# Patient Record
Sex: Female | Born: 1935
Health system: Southern US, Community
[De-identification: ages and names within clinical notes are randomized; demographics above are authoritative.]

## PROBLEM LIST (undated history)

## (undated) DIAGNOSIS — B029 Zoster without complications: Secondary | ICD-10-CM

## (undated) DIAGNOSIS — I272 Pulmonary hypertension, unspecified: Secondary | ICD-10-CM

## (undated) DIAGNOSIS — K5790 Diverticulosis of intestine, part unspecified, without perforation or abscess without bleeding: Secondary | ICD-10-CM

## (undated) DIAGNOSIS — I480 Paroxysmal atrial fibrillation: Secondary | ICD-10-CM

## (undated) DIAGNOSIS — K648 Other hemorrhoids: Secondary | ICD-10-CM

## (undated) DIAGNOSIS — J302 Other seasonal allergic rhinitis: Secondary | ICD-10-CM

## (undated) DIAGNOSIS — I2781 Cor pulmonale (chronic): Secondary | ICD-10-CM

## (undated) DIAGNOSIS — I10 Essential (primary) hypertension: Secondary | ICD-10-CM

## (undated) DIAGNOSIS — I4819 Other persistent atrial fibrillation: Secondary | ICD-10-CM

## (undated) DIAGNOSIS — E039 Hypothyroidism, unspecified: Secondary | ICD-10-CM

## (undated) HISTORY — PX: ABDOMINAL HYSTERECTOMY: SUR658

## (undated) HISTORY — DX: Essential (primary) hypertension: I10

## (undated) HISTORY — DX: Zoster without complications: B02.9

## (undated) HISTORY — DX: Pulmonary hypertension, unspecified: I27.20

## (undated) HISTORY — DX: Other seasonal allergic rhinitis: J30.2

## (undated) HISTORY — DX: Hypothyroidism, unspecified: E03.9

## (undated) HISTORY — DX: Paroxysmal atrial fibrillation: I48.0

## (undated) HISTORY — DX: Other persistent atrial fibrillation: I48.19

## (undated) HISTORY — DX: Cor pulmonale (chronic): I27.81

## (undated) HISTORY — DX: Other hemorrhoids: K64.8

## (undated) HISTORY — DX: Diverticulosis of intestine, part unspecified, without perforation or abscess without bleeding: K57.90

## (undated) HISTORY — PX: CATARACT EXTRACTION: SUR2

---

## 2001-05-16 ENCOUNTER — Ambulatory Visit (HOSPITAL_COMMUNITY): Admission: RE | Admit: 2001-05-16 | Discharge: 2001-05-16 | Payer: Self-pay

## 2001-05-16 ENCOUNTER — Encounter: Payer: Self-pay | Admitting: Internal Medicine

## 2002-06-26 ENCOUNTER — Encounter: Payer: Self-pay | Admitting: Internal Medicine

## 2002-06-26 ENCOUNTER — Ambulatory Visit (HOSPITAL_COMMUNITY): Admission: RE | Admit: 2002-06-26 | Discharge: 2002-06-26 | Payer: Self-pay | Admitting: Internal Medicine

## 2002-08-03 ENCOUNTER — Ambulatory Visit (HOSPITAL_COMMUNITY): Admission: RE | Admit: 2002-08-03 | Discharge: 2002-08-03 | Payer: Self-pay | Admitting: Internal Medicine

## 2003-01-21 ENCOUNTER — Emergency Department (HOSPITAL_COMMUNITY): Admission: EM | Admit: 2003-01-21 | Discharge: 2003-01-21 | Payer: Self-pay | Admitting: *Deleted

## 2003-01-21 ENCOUNTER — Encounter: Payer: Self-pay | Admitting: *Deleted

## 2003-07-02 ENCOUNTER — Ambulatory Visit (HOSPITAL_COMMUNITY): Admission: RE | Admit: 2003-07-02 | Discharge: 2003-07-02 | Payer: Self-pay | Admitting: Internal Medicine

## 2004-07-03 ENCOUNTER — Ambulatory Visit (HOSPITAL_COMMUNITY): Admission: RE | Admit: 2004-07-03 | Discharge: 2004-07-03 | Payer: Self-pay | Admitting: Internal Medicine

## 2004-09-03 ENCOUNTER — Ambulatory Visit: Payer: Self-pay | Admitting: Internal Medicine

## 2004-09-10 ENCOUNTER — Ambulatory Visit: Payer: Self-pay | Admitting: Internal Medicine

## 2004-09-10 ENCOUNTER — Ambulatory Visit (HOSPITAL_COMMUNITY): Admission: RE | Admit: 2004-09-10 | Discharge: 2004-09-10 | Payer: Self-pay | Admitting: Internal Medicine

## 2005-07-14 ENCOUNTER — Ambulatory Visit (HOSPITAL_COMMUNITY): Admission: RE | Admit: 2005-07-14 | Discharge: 2005-07-14 | Payer: Self-pay | Admitting: Internal Medicine

## 2005-08-12 ENCOUNTER — Ambulatory Visit (HOSPITAL_COMMUNITY): Admission: RE | Admit: 2005-08-12 | Discharge: 2005-08-12 | Payer: Self-pay | Admitting: Cardiology

## 2006-05-01 ENCOUNTER — Ambulatory Visit (HOSPITAL_COMMUNITY): Admission: RE | Admit: 2006-05-01 | Discharge: 2006-05-01 | Payer: Self-pay | Admitting: Internal Medicine

## 2006-05-03 ENCOUNTER — Encounter: Payer: Self-pay | Admitting: Internal Medicine

## 2006-05-06 ENCOUNTER — Ambulatory Visit (HOSPITAL_COMMUNITY): Admission: RE | Admit: 2006-05-06 | Discharge: 2006-05-06 | Payer: Self-pay | Admitting: Interventional Radiology

## 2006-05-25 ENCOUNTER — Encounter: Payer: Self-pay | Admitting: Interventional Radiology

## 2006-08-16 ENCOUNTER — Ambulatory Visit (HOSPITAL_COMMUNITY): Admission: RE | Admit: 2006-08-16 | Discharge: 2006-08-16 | Payer: Self-pay | Admitting: Internal Medicine

## 2010-06-29 ENCOUNTER — Encounter: Payer: Self-pay | Admitting: Internal Medicine

## 2012-02-09 ENCOUNTER — Encounter: Payer: Self-pay | Admitting: Cardiology

## 2012-02-09 ENCOUNTER — Encounter: Payer: Self-pay | Admitting: Internal Medicine

## 2012-02-09 DIAGNOSIS — I4891 Unspecified atrial fibrillation: Secondary | ICD-10-CM

## 2012-02-10 DIAGNOSIS — I4891 Unspecified atrial fibrillation: Secondary | ICD-10-CM

## 2012-03-01 ENCOUNTER — Encounter: Payer: Self-pay | Admitting: Cardiology

## 2012-03-14 ENCOUNTER — Ambulatory Visit (INDEPENDENT_AMBULATORY_CARE_PROVIDER_SITE_OTHER): Payer: Medicare Other | Admitting: Cardiology

## 2012-03-14 ENCOUNTER — Encounter: Payer: Self-pay | Admitting: Cardiology

## 2012-03-14 VITALS — BP 134/74 | HR 75 | Ht 62.0 in | Wt 101.8 lb

## 2012-03-14 DIAGNOSIS — I10 Essential (primary) hypertension: Secondary | ICD-10-CM

## 2012-03-14 DIAGNOSIS — I480 Paroxysmal atrial fibrillation: Secondary | ICD-10-CM | POA: Insufficient documentation

## 2012-03-14 DIAGNOSIS — I4891 Unspecified atrial fibrillation: Secondary | ICD-10-CM

## 2012-03-14 DIAGNOSIS — Z136 Encounter for screening for cardiovascular disorders: Secondary | ICD-10-CM

## 2012-03-14 NOTE — Assessment & Plan Note (Signed)
Plan is to continue current regimen including beta blocker and Xarelto. Followup echocardiogram will be obtained in the next 3 weeks, mainly to exclude the presence of definite left atrial thrombus, suspect artifact based on prior study. If this cannot be clarified, we can always consider a TEE, although she will be continued onanticoagulation anyway.

## 2012-03-14 NOTE — Patient Instructions (Addendum)
Your physician recommends that you schedule a follow-up appointment in: 3 months. Your physician recommends that you continue on your current medications as directed. Please refer to the Current Medication list given to you today. Your physician has requested that you have an echocardiogram. Echocardiography is a painless test that uses sound waves to create images of your heart. It provides your doctor with information about the size and shape of your heart and how well your heart's chambers and valves are working. This procedure takes approximately one hour. There are no restrictions for this procedure.  

## 2012-03-14 NOTE — Progress Notes (Signed)
   Clinical Summary Shelly Fields is a 77 y.o.female presenting for office followup. She was seen in consultation at [facility] back in September with newly diagnosed atrial fibrillation, possibly paroxysmal based on history. She spontaneously converted to sinus rhythm. We recommended anticoagulation with inability to exclude left atrial thrombus by echocardiogram (report commented on the possibility of artifact), also CHAD2 score of approximately 2. She had no thromboembolic signs or symptoms however. We did not pursue a TEE. She initially wanted to use Coumadin with followup per Dr. Sherril Croon, however ultimately elected to start Xarelto. She has had no bleeding problems.  She reports no prolonged palpitations, ECG shows normal sinus rhythm at 75 beats per minute with left atrial enlargement and incomplete right bundle branch block.   No Known Allergies  Current Outpatient Prescriptions  Medication Sig Dispense Refill  . calcium carbonate (OS-CAL) 600 MG TABS Take 600 mg by mouth 2 (two) times daily with a meal.      . Cholecalciferol (VITAMIN D3) 5000 UNITS CAPS Take 5,000 Units by mouth daily.      Marland Kitchen docusate sodium (COLACE) 100 MG capsule Take 100 mg by mouth at bedtime.      Marland Kitchen levothyroxine (SYNTHROID, LEVOTHROID) 50 MCG tablet Take 50 mcg by mouth daily.      . metoprolol tartrate (LOPRESSOR) 25 MG tablet Take 25 mg by mouth daily.       . Multiple Vitamin (MULTIVITAMIN) tablet Take 1 tablet by mouth daily.      Carlena Hurl 20 MG TABS Take 20 mg by mouth daily.         Past Medical History  Diagnosis Date  . Hypothyroidism   . Shingles   . Internal hemorrhoids   . Seasonal allergies   . Essential hypertension, benign   . Diverticulosis     Social History Shelly Fields reports that she has never smoked. She does not have any smokeless tobacco history on file. Ms. Blackwelder reports that she does not drink alcohol.  Review of Systems Otherwise negative except as outlined.  Physical  Examination Filed Vitals:   03/14/12 1418  BP: 134/74  Pulse: 75   Filed Weights   03/14/12 1418  Weight: 101 lb 12.8 oz (46.176 kg)   Thin woman in no acute distress. HEENT: Conjunctiva and lids normal, oropharynx clear. Neck: Supple, no elevated JVP or carotid bruits, no thyromegaly. Lungs: Clear to auscultation, nonlabored breathing at rest. Cardiac: Regular rate and rhythm, no S3, soft systolic murmur, no pericardial rub. Abdomen: Soft, nontender, bowel sounds present, no guarding or rebound. Extremities: No pitting edema, distal pulses 2+. Skin: Warm and dry. Musculoskeletal: No kyphosis. Neuropsychiatric: Alert and oriented x3, affect grossly appropriate.   Problem List and Plan   Atrial fibrillation Plan is to continue current regimen including beta blocker and Xarelto. Followup echocardiogram will be obtained in the next 3 weeks, mainly to exclude the presence of definite left atrial thrombus, suspect artifact based on prior study. If this cannot be clarified, we can always consider a TEE, although she will be continued onanticoagulation anyway.  Essential hypertension, benign Blood pressure is reasonable today.    Jonelle Sidle, M.D., F.A.C.C.

## 2012-03-14 NOTE — Assessment & Plan Note (Signed)
Blood pressure is reasonable today. 

## 2012-04-06 ENCOUNTER — Telehealth: Payer: Self-pay | Admitting: Cardiology

## 2012-04-06 ENCOUNTER — Other Ambulatory Visit: Payer: Self-pay

## 2012-04-06 ENCOUNTER — Other Ambulatory Visit (INDEPENDENT_AMBULATORY_CARE_PROVIDER_SITE_OTHER): Payer: Medicare Other

## 2012-04-06 DIAGNOSIS — I4891 Unspecified atrial fibrillation: Secondary | ICD-10-CM

## 2012-04-06 NOTE — Telephone Encounter (Signed)
Patient states had appointment for echo today.  Patient states that she received a message from [place], but had a hard time hearing the message on the phone.  Maryella Shivers was with a patient at the moment and would call her back after clinic.  Patient understood this.

## 2012-04-06 NOTE — Telephone Encounter (Signed)
Message copied by Eustace Moore on Wed Apr 06, 2012  4:14 PM ------      Message from: MCDOWELL, Illene Bolus      Created: Wed Apr 06, 2012  2:52 PM       Reviewed. No definite left atrial thrombus. Continue medical therapy. No clear indication for TEE at this time.

## 2012-04-06 NOTE — Telephone Encounter (Signed)
Patient informed. 

## 2012-05-09 ENCOUNTER — Telehealth: Payer: Self-pay | Admitting: *Deleted

## 2012-05-09 NOTE — Telephone Encounter (Signed)
Started feeling palpitations last evening - lasted about 1 hour.  Went on to bed, was able to sleep all night without any difficulty. Then, this morning c/o feeling weak with slight nausea.  BP - 97/67  83.  No fever.  Now feels okay.  Does already have follow up with her PMD ([facility]) tomorrow.  Is due to see SM on January 8th.

## 2012-06-14 ENCOUNTER — Telehealth: Payer: Self-pay | Admitting: *Deleted

## 2012-06-14 NOTE — Telephone Encounter (Signed)
Patient called to cancel appointment for in the morning because her water is frozen today. Nurse advised patient that she could call in the morning to let us know if she can't make it. Patient concerned about being charged if she calls tomorrow. Nurse advised patient that she would not be charged for calling to reschedule on same day of appointment.

## 2012-06-15 ENCOUNTER — Ambulatory Visit: Payer: Medicare Other | Admitting: Cardiology

## 2012-06-17 ENCOUNTER — Encounter: Payer: Self-pay | Admitting: Cardiology

## 2012-06-17 ENCOUNTER — Telehealth: Payer: Self-pay | Admitting: *Deleted

## 2012-06-17 NOTE — Telephone Encounter (Signed)
Woke up this morning at 4 am feeling clammy and felt her heart beating funny. Patient said she stood up and fell to floor because she passed out. Patient checked her BP which was 109/68 & HR 79 and now HR is 98. Patient can feel her heart beating funny now. No sob.  Patient denies chest pain. Nurse informed patient to go to ED for evaluation since she passed out this morning and patient reluctant to going to ED. Patient request message be sent to MD for advise.

## 2012-06-18 ENCOUNTER — Encounter: Payer: Self-pay | Admitting: Cardiovascular Disease

## 2012-06-20 ENCOUNTER — Telehealth: Payer: Self-pay | Admitting: Cardiology

## 2012-06-20 NOTE — Telephone Encounter (Signed)
Patient wanted to know if she needed to keep the f/u visit with cardiology this week since she has appointment with PCP on Friday. Nurse advised patient that hospital note suggest she consult with cardiology on an outpatient basis and that she should come to r/o any cardiology problems. Patient  Verbalized understanding of plan.

## 2012-06-20 NOTE — Telephone Encounter (Signed)
Patient wanted nurse to call her in reference to last weeks call.  She went to hospital like you advise.  Would like to discuss upcoming appointment with you

## 2012-06-23 ENCOUNTER — Encounter: Payer: Self-pay | Admitting: Cardiovascular Disease

## 2012-06-23 ENCOUNTER — Ambulatory Visit (INDEPENDENT_AMBULATORY_CARE_PROVIDER_SITE_OTHER): Payer: Medicare Other | Admitting: Cardiovascular Disease

## 2012-06-23 VITALS — BP 138/73 | HR 62 | Ht 62.0 in | Wt 101.0 lb

## 2012-06-23 DIAGNOSIS — I4891 Unspecified atrial fibrillation: Secondary | ICD-10-CM

## 2012-06-23 DIAGNOSIS — I10 Essential (primary) hypertension: Secondary | ICD-10-CM

## 2012-06-23 DIAGNOSIS — R55 Syncope and collapse: Secondary | ICD-10-CM

## 2012-06-23 NOTE — Assessment & Plan Note (Signed)
Recent syncopal episode which from the history is suggestive of neurocardiogenic (vasovagal) syncope. However, given her reported symptoms of fluctuating heart rate, there is a possibility of postconversion pauses from atrial fibrillation. I recommend a 48 hour Holter monitor. If the patient experiences recurrent syncopal episode with no significant events noted on Holter monitor, then she will need a 30 day event monitor.  I advised the patient to notify us if she develops any headache given that she is on anticoagulation. CT scan obviously did not show evidence of intracranial or subdural bleed.

## 2012-06-23 NOTE — Progress Notes (Signed)
   Clinical Summary Shelly Fields is a 78 y.o.female presenting for office followup after her recent syncopal episode and brief hospitalization at [facility]. She was initially seen in consultation at [facility] back in September with newly diagnosed atrial fibrillation, possibly paroxysmal based on history. She spontaneously converted to sinus rhythm. Anticoagulation was recommended with inability to exclude left atrial thrombus by echocardiogram (report commented on the possibility of artifact), also CHAD2 score of  2. She underwent a followup echocardiogram that showed no evidence of intracardiac thrombus. She was in her usual health up until Friday. She woke up not feeling well with weakness, feeling sweaty and clammy. She went to the bathroom and after she urinated, she passed out and fell on the floor. She hit her head. She went to the emergency room at [facility]. Her labs were unremarkable. CT scan of the head showed no evidence of intracranial bleed. There was a small superficial hematoma. After the event, she noticed fluctuation in her heart rate. Her blood pressure was 105/73. No event since then. She denies chest pain or dyspnea.  No Known Allergies  Current Outpatient Prescriptions  Medication Sig Dispense Refill  . calcium carbonate (OS-CAL) 600 MG TABS Take 600 mg by mouth 2 (two) times daily with a meal.      . Cholecalciferol (VITAMIN D3) 5000 UNITS CAPS Take 5,000 Units by mouth daily.      Marland Kitchen docusate sodium (COLACE) 100 MG capsule Take 200 mg by mouth at bedtime.       Marland Kitchen levothyroxine (SYNTHROID, LEVOTHROID) 50 MCG tablet Take 50 mcg by mouth daily.      . metoprolol succinate (TOPROL-XL) 25 MG 24 hr tablet Take 1 tablet by mouth Daily.      . Multiple Vitamin (MULTIVITAMIN) tablet Take 1 tablet by mouth daily.      Carlena Hurl 20 MG TABS Take 20 mg by mouth daily.         Past Medical History  Diagnosis Date  . Hypothyroidism   . Shingles   . Internal hemorrhoids   . Seasonal  allergies   . Essential hypertension, benign   . Diverticulosis     Social History Shelly Fields reports that she has never smoked. She has never used smokeless tobacco. Ms. Cronk reports that she does not drink alcohol.  Review of Systems Otherwise negative except as outlined.  Physical Examination Filed Vitals:   06/23/12 1005  BP: 138/73  Pulse: 62   Filed Weights   06/23/12 1005  Weight: 101 lb (45.813 kg)   Thin woman in no acute distress. HEENT: Conjunctiva and lids normal, oropharynx clear. Neck: Supple, no elevated JVP or carotid bruits, no thyromegaly. Lungs: Clear to auscultation, nonlabored breathing at rest. Cardiac: Regular rate and rhythm, no S3, soft systolic murmur, no pericardial rub. Abdomen: Soft, nontender, bowel sounds present, no guarding or rebound. Extremities: No pitting edema, distal pulses 2+. Skin: Warm and dry. Musculoskeletal: No kyphosis. Neuropsychiatric: Alert and oriented x3, affect grossly appropriate.   Recent EKG showed normal sinus rhythm with no significant ST changes.

## 2012-06-23 NOTE — Assessment & Plan Note (Signed)
Blood pressure is well controlled 

## 2012-06-23 NOTE — Assessment & Plan Note (Signed)
She is in sinus rhythm.

## 2012-06-23 NOTE — Patient Instructions (Addendum)
   48 hour holter monitor - placed today  Office will notify of results Continue all current medications. Follow up in  1 month

## 2012-07-27 ENCOUNTER — Ambulatory Visit (INDEPENDENT_AMBULATORY_CARE_PROVIDER_SITE_OTHER): Payer: Medicare Other | Admitting: Cardiology

## 2012-07-27 ENCOUNTER — Encounter: Payer: Self-pay | Admitting: Cardiology

## 2012-07-27 VITALS — BP 137/76 | HR 60 | Ht 62.0 in | Wt 101.0 lb

## 2012-07-27 DIAGNOSIS — I4891 Unspecified atrial fibrillation: Secondary | ICD-10-CM

## 2012-07-27 DIAGNOSIS — I10 Essential (primary) hypertension: Secondary | ICD-10-CM

## 2012-07-27 DIAGNOSIS — R55 Syncope and collapse: Secondary | ICD-10-CM

## 2012-07-27 MED ORDER — RIVAROXABAN 20 MG PO TABS: 20.0000 mg | ORAL_TABLET | Freq: Every day | ORAL | Status: DC

## 2012-07-27 NOTE — Assessment & Plan Note (Signed)
Suspect that this episode was neurocardiogenic in etiology based on her description. Will continue to monitor for now.

## 2012-07-27 NOTE — Progress Notes (Signed)
   Clinical Summary Ms. [name] is a 78 y.o.female presenting for followup. She was last seen in October 2013. Followup echocardiogram at that time showed no clear evidence of definite left atrial thrombus, LVEF 60-65%, grade 2 diastolic dysfunction, trivial aortic regurgitation, mild to moderate tricuspid regurgitation, PASP 26 mm mercury. We reviewed this today.  She is doing well, reports no significant palpitations. Continues on Toprol-XL and Xarelto. Has had no bleeding problems. I reviewed her home blood pressure and heart rate checks. She had one spell of brief syncope since I last saw her, seems to have been neurocardiogenic based on description, associated with nausea and sweating. Otherwise no significant functional limitation.   No Known Allergies  Current Outpatient Prescriptions  Medication Sig Dispense Refill  . calcium carbonate (OS-CAL) 600 MG TABS Take 600 mg by mouth 2 (two) times daily with a meal.      . Cholecalciferol (VITAMIN D3) 5000 UNITS CAPS Take 5,000 Units by mouth daily.      Marland Kitchen docusate sodium (COLACE) 100 MG capsule Take 200 mg by mouth at bedtime.       Marland Kitchen levothyroxine (SYNTHROID, LEVOTHROID) 50 MCG tablet Take 50 mcg by mouth daily.      . metoprolol succinate (TOPROL-XL) 25 MG 24 hr tablet Take 1 tablet by mouth Daily.      . Multiple Vitamin (MULTIVITAMIN) tablet Take 1 tablet by mouth daily.      . Rivaroxaban (XARELTO) 20 MG TABS Take 1 tablet (20 mg total) by mouth daily.  30 tablet  6   No current facility-administered medications for this visit.    Past Medical History  Diagnosis Date  . Hypothyroidism   . Shingles   . Internal hemorrhoids   . Seasonal allergies   . Essential hypertension, benign   . Diverticulosis   . Paroxysmal atrial fibrillation     Social History Ms. [name] reports that she has never smoked. She has never used smokeless tobacco. Ms. Roscher reports that she does not drink alcohol.  Review of Systems Negative except as  outlined.  Physical Examination Filed Vitals:   07/27/12 1441  BP: 137/76  Pulse: 60   Filed Weights   07/27/12 1441  Weight: 101 lb (45.813 kg)    Thin woman in no acute distress.  HEENT: Conjunctiva and lids normal, oropharynx clear.  Neck: Supple, no elevated JVP or carotid bruits, no thyromegaly.  Lungs: Clear to auscultation, nonlabored breathing at rest.  Cardiac: Regular rate and rhythm, no S3, soft systolic murmur, no pericardial rub.  Abdomen: Soft, nontender, bowel sounds present, no guarding or rebound.  Extremities: No pitting edema, distal pulses 2+.    Problem List and Plan   Atrial fibrillation Paroxysmal, she is in sinus rhythm today. Continue current regimen and followup.  Essential hypertension, benign No change to current regimen, reviewed outpatient blood pressure recordings which largely look good.  Syncope Suspect that this episode was neurocardiogenic in etiology based on her description. Will continue to monitor for now.    Jonelle Sidle, M.D., F.A.C.C.

## 2012-07-27 NOTE — Assessment & Plan Note (Signed)
Paroxysmal, she is in sinus rhythm today. Continue current regimen and followup.

## 2012-07-27 NOTE — Assessment & Plan Note (Signed)
No change to current regimen, reviewed outpatient blood pressure recordings which largely look good.

## 2012-07-27 NOTE — Patient Instructions (Addendum)

## 2012-11-29 ENCOUNTER — Encounter (INDEPENDENT_AMBULATORY_CARE_PROVIDER_SITE_OTHER): Payer: Self-pay | Admitting: *Deleted

## 2012-11-29 ENCOUNTER — Encounter (INDEPENDENT_AMBULATORY_CARE_PROVIDER_SITE_OTHER): Payer: Self-pay

## 2012-12-07 ENCOUNTER — Telehealth (INDEPENDENT_AMBULATORY_CARE_PROVIDER_SITE_OTHER): Payer: Self-pay | Admitting: *Deleted

## 2012-12-07 ENCOUNTER — Other Ambulatory Visit (INDEPENDENT_AMBULATORY_CARE_PROVIDER_SITE_OTHER): Payer: Self-pay | Admitting: *Deleted

## 2012-12-07 DIAGNOSIS — Z1211 Encounter for screening for malignant neoplasm of colon: Secondary | ICD-10-CM

## 2012-12-07 DIAGNOSIS — Z86010 Personal history of colonic polyps: Secondary | ICD-10-CM

## 2012-12-07 NOTE — Telephone Encounter (Signed)
Patient needs movi prep 

## 2012-12-13 MED ORDER — PEG-KCL-NACL-NASULF-NA ASC-C 100 G PO SOLR: 1.0000 | Freq: Once | ORAL | Status: DC

## 2012-12-20 ENCOUNTER — Telehealth (INDEPENDENT_AMBULATORY_CARE_PROVIDER_SITE_OTHER): Payer: Self-pay | Admitting: *Deleted

## 2012-12-20 NOTE — Telephone Encounter (Signed)
agree

## 2012-12-20 NOTE — Telephone Encounter (Signed)
  Procedure: tcs  Reason/Indication:  Hx polyps  Has patient had this procedure before?  Yes, 12/2007 (scanned)  If so, when, by whom and where?    Is there a family history of colon cancer?  no  Who?  What age when diagnosed?    Is patient diabetic?   no      Does patient have prosthetic heart valve?  no  Do you have a pacemaker?  no  Has patient ever had endocarditis? no  Has patient had joint replacement within last 12 months?  no  Is patient on Coumadin, Plavix and/or Aspirin? yes  Medications: xarelto 20 mg daily, levothyroxine 50 mg daily, metoprolol 25 mg daily, calcium bid, multi vit daily, vit d3 5000 iu daily, stool softener bid nightly, laxative prn  Allergies: nkda  Medication Adjustment: xarelto 1-2 days  Procedure date & time: 01/12/13 at 1030

## 2013-01-04 ENCOUNTER — Encounter (HOSPITAL_COMMUNITY): Payer: Self-pay | Admitting: Pharmacy Technician

## 2013-01-11 ENCOUNTER — Other Ambulatory Visit: Payer: Self-pay

## 2013-01-12 ENCOUNTER — Encounter (HOSPITAL_COMMUNITY): Payer: Self-pay | Admitting: *Deleted

## 2013-01-12 ENCOUNTER — Encounter (HOSPITAL_COMMUNITY)
Admission: RE | Disposition: A | Discharge status: Home / Self Care | Payer: Self-pay | Source: Ambulatory Visit | Attending: Internal Medicine

## 2013-01-12 ENCOUNTER — Ambulatory Visit (HOSPITAL_COMMUNITY)
Admission: RE | Admit: 2013-01-12 | Discharge: 2013-01-12 | Disposition: A | Discharge status: Home / Self Care | Payer: Medicare Other | Source: Ambulatory Visit | Attending: Internal Medicine | Admitting: Internal Medicine

## 2013-01-12 DIAGNOSIS — K5730 Diverticulosis of large intestine without perforation or abscess without bleeding: Secondary | ICD-10-CM

## 2013-01-12 DIAGNOSIS — K644 Residual hemorrhoidal skin tags: Secondary | ICD-10-CM | POA: Insufficient documentation

## 2013-01-12 DIAGNOSIS — Z860100 Personal history of colon polyps, unspecified: Secondary | ICD-10-CM | POA: Insufficient documentation

## 2013-01-12 DIAGNOSIS — Z8371 Family history of colonic polyps: Secondary | ICD-10-CM | POA: Insufficient documentation

## 2013-01-12 DIAGNOSIS — Z83719 Family history of colon polyps, unspecified: Secondary | ICD-10-CM | POA: Insufficient documentation

## 2013-01-12 DIAGNOSIS — I10 Essential (primary) hypertension: Secondary | ICD-10-CM | POA: Insufficient documentation

## 2013-01-12 DIAGNOSIS — K6389 Other specified diseases of intestine: Secondary | ICD-10-CM

## 2013-01-12 HISTORY — PX: COLONOSCOPY: SHX5424

## 2013-01-12 SURGERY — COLONOSCOPY
Anesthesia: Moderate Sedation

## 2013-01-12 MED ORDER — MIDAZOLAM HCL 5 MG/5ML IJ SOLN
INTRAMUSCULAR | Status: AC
  Filled 2013-01-12: qty 10

## 2013-01-12 MED ORDER — MIDAZOLAM HCL 5 MG/5ML IJ SOLN
INTRAMUSCULAR | Status: DC | PRN
  Administered 2013-01-12: 2 mg via INTRAVENOUS
  Administered 2013-01-12 (×3): 1 mg via INTRAVENOUS

## 2013-01-12 MED ORDER — STERILE WATER FOR IRRIGATION IR SOLN
Status: DC | PRN
  Administered 2013-01-12: 11:00:00

## 2013-01-12 MED ORDER — SODIUM CHLORIDE 0.9 % IV SOLN
INTRAVENOUS | Status: DC
  Administered 2013-01-12: 10:00:00 via INTRAVENOUS

## 2013-01-12 MED ORDER — MEPERIDINE HCL 50 MG/ML IJ SOLN
INTRAMUSCULAR | Status: DC | PRN
  Administered 2013-01-12 (×2): 25 mg via INTRAVENOUS

## 2013-01-12 MED ORDER — MEPERIDINE HCL 50 MG/ML IJ SOLN
INTRAMUSCULAR | Status: AC
  Filled 2013-01-12: qty 1

## 2013-01-12 NOTE — Op Note (Signed)
COLONOSCOPY PROCEDURE REPORT  PATIENT:  Shelly Fields  MR#:  [id number] Birthdate:  1935/04/24, 78 y.o., female Endoscopist:  Dr. Malissa Hippo, MD Referred By:  Dr. Ignatius Specking, MD Procedure Date: 01/12/2013  Procedure:   Colonoscopy  Indications:  Patient is 78 year old Caucasian female with history of colonic adenoma and family history of colonic polyps.  Informed Consent:  The procedure and risks were reviewed with the patient and informed consent was obtained.  Medications:  Demerol 50 mg IV Versed 5 mg IV  Description of procedure:  After a digital rectal exam was performed, video colonoscope was advanced from the anus through the rectum and colon to the area of the cecum, ileocecal valve and appendiceal orifice. The cecum was deeply intubated. These structures were well-seen and photographed for the record. From the level of the cecum and ileocecal valve, the scope was slowly and cautiously withdrawn. The mucosal surfaces were carefully surveyed utilizing scope tip to flexion to facilitate fold flattening as needed. The scope was pulled down into the rectum where a thorough exam including retroflexion was performed.  Findings:  Procedure started with the pediatric colonoscope and completed with Slim scope.  Prep excellent. Tortuous colon with multiple diverticula and sigmoid colon with few at descending colon. 2 small AV malformations or ileocecal valve. No evidence of the recurrent polyps. Normal rectal mucosa. Prominent hemorrhoids below the dentate line longus single anal papilla.     Therapeutic/Diagnostic Maneuvers Performed:  None  Complications:  None  Cecal Withdrawal Time:  7 minutes  Impression:  Examination performed to cecum. 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 Left-sided diverticulosis. External hemorrhoids and single anal papilla. No evidence of recurrent polyps.  Recommendations:  Standard instructions given. Patient can wait 10 years  before next exam.   REHMAN,NAJEEB U  01/12/2013 11:25 AM  CC: Dr. Ignatius Specking., MD & Dr. Bonnetta Barry ref. provider found

## 2013-01-12 NOTE — H&P (Signed)
Shelly Fields is an 78 y.o. female.   Chief Complaint: Patient is here for colonoscopy. HPI: Patient is 78 year old Caucasian female with history of colonic adenoma and family history of colonic polyps in her sister who eventually died of ovarian carcinoma. She has history of constipation and she is able to control by dietary measures. She denies abdominal pain or rectal bleeding. History is negative for colorectal carcinoma.  Past Medical History  Diagnosis Date  . Hypothyroidism   . Shingles   . Internal hemorrhoids   . Seasonal allergies   . Essential hypertension, benign   . Diverticulosis   . Paroxysmal atrial fibrillation     Past Surgical History  Procedure Laterality Date  . Abdominal hysterectomy    . Cataract extraction      Family History  Problem Relation Age of Onset  . Heart disease Mother   . Hypertension Father    Social History:  reports that she has never smoked. She has never used smokeless tobacco. She reports that she does not drink alcohol or use illicit drugs.  Allergies: No Known Allergies  Medications Prior to Admission  Medication Sig Dispense Refill  . calcium carbonate (OS-CAL) 600 MG TABS Take 600 mg by mouth 2 (two) times daily with a meal.      . Cholecalciferol (VITAMIN D3) 5000 UNITS CAPS Take 5,000 Units by mouth daily.      Marland Kitchen docusate sodium (COLACE) 100 MG capsule Take 200 mg by mouth at bedtime.       Marland Kitchen levothyroxine (SYNTHROID, LEVOTHROID) 50 MCG tablet Take 50 mcg by mouth daily.      . metoprolol tartrate (LOPRESSOR) 25 MG tablet Take 25 mg by mouth daily at 12 noon.      . Multiple Vitamin (MULTIVITAMIN) tablet Take 1 tablet by mouth daily.      . peg 3350 powder (MOVIPREP) 100 G SOLR Take 1 kit (100 g total) by mouth once.  1 kit  0  . Rivaroxaban (XARELTO) 20 MG TABS Take 1 tablet (20 mg total) by mouth daily.  30 tablet  6    No results found for this or any previous visit (from the past 48 hour(s)). No results  found.  ROS  Blood pressure 147/62, pulse 69, temperature 98 F (36.7 C), temperature source Oral, resp. rate 18, SpO2 95.00%. Physical Exam  Constitutional:  Thin Caucasian female in NAD  HENT:  Mouth/Throat: Oropharynx is clear and moist.  Eyes: Conjunctivae are normal. No scleral icterus.  Neck: No thyromegaly present.  Cardiovascular: Normal rate, regular rhythm and normal heart sounds.   Respiratory: Effort normal.  GI: Soft. She exhibits no distension and no mass. There is no tenderness.  Musculoskeletal: She exhibits no edema.  Lymphadenopathy:    She has no cervical adenopathy.  Neurological: She is alert.  Skin: Skin is warm and dry.     Assessment/Plan History of colonic adenoma. Family history of colonic polyps(sister). Surveillance colonoscopy  Claressa Hughley U 01/12/2013, 10:22 AM

## 2013-01-13 ENCOUNTER — Encounter (HOSPITAL_COMMUNITY): Payer: Self-pay | Admitting: Internal Medicine

## 2013-03-01 ENCOUNTER — Other Ambulatory Visit: Payer: Self-pay | Admitting: Cardiology

## 2013-03-15 ENCOUNTER — Ambulatory Visit (INDEPENDENT_AMBULATORY_CARE_PROVIDER_SITE_OTHER): Payer: Medicare Other | Admitting: Cardiology

## 2013-03-15 ENCOUNTER — Encounter: Payer: Self-pay | Admitting: Cardiology

## 2013-03-15 VITALS — BP 125/79 | HR 66 | Ht 62.0 in | Wt 98.0 lb

## 2013-03-15 DIAGNOSIS — I4891 Unspecified atrial fibrillation: Secondary | ICD-10-CM

## 2013-03-15 DIAGNOSIS — I10 Essential (primary) hypertension: Secondary | ICD-10-CM

## 2013-03-15 MED ORDER — RIVAROXABAN 20 MG PO TABS: 20.0000 mg | ORAL_TABLET | Freq: Every day | ORAL | Status: DC

## 2013-03-15 MED ORDER — METOPROLOL SUCCINATE ER 25 MG PO TB24: 25.0000 mg | ORAL_TABLET | Freq: Every day | ORAL | Status: DC

## 2013-03-15 NOTE — Assessment & Plan Note (Signed)
Blood pressure well-controlled today. 

## 2013-03-15 NOTE — Assessment & Plan Note (Signed)
Paroxysmal, infrequent on current regimen. Plan to change her back to Toprol-XL from Lopressor, continue Xarelto - she is currently in the doughnut hole, samples were given. Followup in 6 months.

## 2013-03-15 NOTE — Progress Notes (Signed)
    Clinical Summary Shelly Fields is a 78 y.o.female last seen in February of this year. She has been doing well, stays active. States that she has been doing some exercises including walking most days of the week. Has only occasional sense of palpitations.  Followup echocardiogram in October 2013 showed no clear evidence of definite left atrial thrombus, LVEF 60-65%, grade 2 diastolic dysfunction, trivial aortic regurgitation, mild to moderate tricuspid regurgitation, PASP 26 mm mercury.  We reviewed her medications. She reports no bleeding problems on Xarelto. No hospitalizations or major interval illnesses. Did have a colonoscopy.   No Known Allergies  Current Outpatient Prescriptions  Medication Sig Dispense Refill  . acyclovir (ZOVIRAX) 200 MG capsule Take 1 capsule by mouth as needed.      . calcium carbonate (OS-CAL) 600 MG TABS Take 600 mg by mouth 2 (two) times daily with a meal.      . Cholecalciferol (VITAMIN D3) 5000 UNITS CAPS Take 5,000 Units by mouth daily.      Marland Kitchen docusate sodium (COLACE) 100 MG capsule Take 200 mg by mouth at bedtime.       Marland Kitchen levothyroxine (SYNTHROID, LEVOTHROID) 50 MCG tablet Take 50 mcg by mouth daily.      . Multiple Vitamin (MULTIVITAMIN) tablet Take 1 tablet by mouth daily.      Carlena Hurl 20 MG TABS tablet TAKE ONE TABLET BY MOUTH EVERY DAY  30 tablet  6  . metoprolol succinate (TOPROL XL) 25 MG 24 hr tablet Take 1 tablet (25 mg total) by mouth daily.  30 tablet  6   No current facility-administered medications for this visit.    Past Medical History  Diagnosis Date  . Hypothyroidism   . Shingles   . Internal hemorrhoids   . Seasonal allergies   . Essential hypertension, benign   . Diverticulosis   . Paroxysmal atrial fibrillation     Social History Shelly Fields reports that she has never smoked. She has never used smokeless tobacco. Shelly Fields reports that she does not drink alcohol.  Review of Systems Negative except as  outlined.  Physical Examination Filed Vitals:   03/15/13 0819  BP: 125/79  Pulse: 66   Filed Weights   03/15/13 0819  Weight: 98 lb (44.453 kg)    Thin woman in no acute distress.  HEENT: Conjunctiva and lids normal, oropharynx clear.  Neck: Supple, no elevated JVP or carotid bruits, no thyromegaly.  Lungs: Clear to auscultation, nonlabored breathing at rest.  Cardiac: Regular rate and rhythm, no S3, soft systolic murmur, no pericardial rub.  Abdomen: Soft, nontender, bowel sounds present, no guarding or rebound.  Extremities: No pitting edema, distal pulses 2+.  Skin: Warm and dry. Musculoskeletal: No kyphosis. Neuropsychiatric: Alert and oriented x3, affect appropriate.   Problem List and Plan   Atrial fibrillation Paroxysmal, infrequent on current regimen. Plan to change her back to Toprol-XL from Lopressor, continue Xarelto - she is currently in the doughnut hole, samples were given. Followup in 6 months.  Essential hypertension, benign Blood pressure well controlled today.    Jonelle Sidle, M.D., F.A.C.C.

## 2013-03-15 NOTE — Patient Instructions (Signed)
Your physician recommends that you schedule a follow-up appointment in: 6 months. You will receive a reminder letter in the mail in about 4 months reminding you to call and schedule your appointment. If you don't receive this letter, please contact our office. Your physician has recommended you make the following change in your medication: Change metoprolol tartrate (lopressor) to toprol xl 25 mg daily. Your new prescription has been sent to your pharmacy. All other medications will remain the same.

## 2013-03-17 ENCOUNTER — Encounter: Payer: Self-pay | Admitting: Cardiology

## 2013-04-13 ENCOUNTER — Other Ambulatory Visit: Payer: Self-pay

## 2013-05-10 ENCOUNTER — Other Ambulatory Visit: Payer: Self-pay | Admitting: *Deleted

## 2013-05-10 MED ORDER — RIVAROXABAN 20 MG PO TABS: 20.0000 mg | ORAL_TABLET | Freq: Every day | ORAL | Status: DC

## 2013-07-14 ENCOUNTER — Other Ambulatory Visit: Payer: Self-pay | Admitting: Cardiology

## 2013-07-14 MED ORDER — RIVAROXABAN 20 MG PO TABS: 20.0000 mg | ORAL_TABLET | Freq: Every day | ORAL | Status: DC

## 2013-07-14 MED ORDER — METOPROLOL SUCCINATE ER 25 MG PO TB24: 25.0000 mg | ORAL_TABLET | Freq: Every day | ORAL | Status: DC

## 2013-10-26 ENCOUNTER — Ambulatory Visit (INDEPENDENT_AMBULATORY_CARE_PROVIDER_SITE_OTHER): Payer: Medicare Other | Admitting: Cardiology

## 2013-10-26 ENCOUNTER — Encounter: Payer: Self-pay | Admitting: Cardiology

## 2013-10-26 VITALS — BP 162/81 | HR 57 | Ht 62.0 in | Wt 101.0 lb

## 2013-10-26 DIAGNOSIS — I4891 Unspecified atrial fibrillation: Secondary | ICD-10-CM

## 2013-10-26 DIAGNOSIS — I10 Essential (primary) hypertension: Secondary | ICD-10-CM

## 2013-10-26 MED ORDER — RIVAROXABAN 20 MG PO TABS: 20.0000 mg | ORAL_TABLET | Freq: Every day | ORAL | Status: DC

## 2013-10-26 NOTE — Patient Instructions (Signed)

## 2013-10-26 NOTE — Assessment & Plan Note (Signed)
Blood pressure rechecked today by me, 128/88. No changes made.

## 2013-10-26 NOTE — Progress Notes (Signed)
    Clinical Summary Shelly Fields is a 79 y.o.female last seen in October 2014. She reports 3 episodes of palpitations consistent with PAF since I last saw her, longest one lasting 24 hours. She reports no major escalation in symptoms on current medical regimen. She has had no bleeding problems on Xarelto.   Lab work from June 2014 showed cholesterol 165, triglycerides 78, HDL 77, LDL 72.  Followup echocardiogram in October 2013 showed no clear evidence of definite left atrial thrombus, LVEF 63-01%, grade 2 diastolic dysfunction, trivial aortic regurgitation, mild to moderate tricuspid regurgitation, PASP 26 mm mercury.  I rechecked her blood pressure today, 128/88.   No Known Allergies  Current Outpatient Prescriptions  Medication Sig Dispense Refill  . acyclovir (ZOVIRAX) 200 MG capsule Take 1 capsule by mouth as needed.      . calcium carbonate (OS-CAL) 600 MG TABS Take 600 mg by mouth 2 (two) times daily with a meal.      . Cholecalciferol (VITAMIN D3) 5000 UNITS CAPS Take 5,000 Units by mouth daily.      Marland Kitchen docusate sodium (COLACE) 100 MG capsule Take 200 mg by mouth at bedtime.       Marland Kitchen levothyroxine (SYNTHROID, LEVOTHROID) 50 MCG tablet Take 50 mcg by mouth daily.      . metoprolol succinate (TOPROL XL) 25 MG 24 hr tablet Take 1 tablet (25 mg total) by mouth daily.  90 tablet  3  . Multiple Vitamin (MULTIVITAMIN) tablet Take 1 tablet by mouth daily.      . rivaroxaban (XARELTO) 20 MG TABS tablet Take 1 tablet (20 mg total) by mouth daily.  25 tablet  3   No current facility-administered medications for this visit.    Past Medical History  Diagnosis Date  . Hypothyroidism   . Shingles   . Internal hemorrhoids   . Seasonal allergies   . Essential hypertension, benign   . Diverticulosis   . Paroxysmal atrial fibrillation     Social History Shelly Fields reports that she has never smoked. She has never used smokeless tobacco. Shelly Fields reports that she does not drink  alcohol.  Review of Systems Negative except as outlined.  Physical Examination Filed Vitals:   10/26/13 1055  BP: 162/81  Pulse: 57   Filed Weights   10/26/13 1055  Weight: 101 lb (45.813 kg)    Thin woman in no acute distress.  HEENT: Conjunctiva and lids normal, oropharynx clear.  Neck: Supple, no elevated JVP or carotid bruits, no thyromegaly.  Lungs: Clear to auscultation, nonlabored breathing at rest.  Cardiac: Regular rate and rhythm, no S3, soft systolic murmur, no pericardial rub.  Abdomen: Soft, nontender, bowel sounds present, no guarding or rebound.  Extremities: No pitting edema, distal pulses 2+.  Skin: Warm and dry.  Musculoskeletal: No kyphosis.  Neuropsychiatric: Alert and oriented x3, affect appropriate.   Problem List and Plan   Atrial fibrillation Paroxysmal. She remains comfortable with current symptom management on the present regimen. I did discuss with her the possibility of antiarrhythmic treatment if her symptoms become more frequent or intense. For now no changes were made.  Essential hypertension, benign Blood pressure rechecked today by me, 128/88. No changes made.    Satira Sark, M.D., F.A.C.C.

## 2013-10-26 NOTE — Assessment & Plan Note (Signed)
Paroxysmal. She remains comfortable with current symptom management on the present regimen. I did discuss with her the possibility of antiarrhythmic treatment if her symptoms become more frequent or intense. For now no changes were made.

## 2013-12-14 ENCOUNTER — Telehealth: Payer: Self-pay | Admitting: Cardiology

## 2013-12-14 NOTE — Telephone Encounter (Signed)
Patient said her heart felt "strange", since Monday. Patient said she could feel her heart beating. Home BP reading was 98/65 & HR 84 onTuesday morning. Patient said when her blood pressure drops around 100/65 or lower, she gets nauseated. Wednesday she felt better but was still a little nauseated and HR was 103. Patient has continued to do her regular activities. No c/o chest pain, dizziness or sob. Patient given an appointment to see Jory Sims tomorrow.

## 2013-12-14 NOTE — Telephone Encounter (Signed)
Patient feels she has been in and out of AFIB since 12/12/13.   Retired Marine scientist friend of hers checked her and said that her AFIB is there constantly

## 2013-12-14 NOTE — Progress Notes (Signed)
HPI: Shelly Fields is a 5 are female patient of Dr. Domenic Polite normally followed in the [place] office, with history of palpitations consistent with paroxysmal atrial fibrillation. The patient continues on Xarelto therapy. Most recent echocardiogram in October of 2013 revealed no clear evidence of definite left atrial thrombus. He Was 6065% with grade 2 diastolic dysfunction.  The patient called [facility] office complaining that her heart felt "strange" for the last 3 days. She felt that her heart was racing, blood pressures at home was 90/55 with a heart rate of 84. She is also complaining of nausea. Head no complaints of chest pain or dizziness but she became concerned about her status. She is here as an add-on for evaluation due to symptoms.  She brings with her a record of her blood pressures and heart rate, and the time that she is taking extra dose of metoprolol. She states that the symptoms usually begin at night when she feels her heart racing. The following morning she feels tired and nauseated. At that point she takes an extra dose of metoprolol tartrate 25 mg. At lunchtime she takes her normal dose of metoprolol succinate 25 mg. Symptoms usually last approximately 24 hours. She is beginning to experience these symptoms approximately every other day now. She repeats this regimen. She is very aware of her regular heart rate, especially at night. And with associated nausea. She has become very anxious about this.  No Known Allergies  Current Outpatient Prescriptions  Medication Sig Dispense Refill  . acyclovir (ZOVIRAX) 200 MG capsule Take 1 capsule by mouth as needed.      . calcium carbonate (OS-CAL) 600 MG TABS Take 600 mg by mouth 2 (two) times daily with a meal.      . Cholecalciferol (VITAMIN D3) 5000 UNITS CAPS Take 5,000 Units by mouth daily.      Marland Kitchen docusate sodium (COLACE) 100 MG capsule Take 200 mg by mouth at bedtime.       Marland Kitchen levothyroxine (SYNTHROID, LEVOTHROID) 50 MCG tablet Take 50 mcg  by mouth daily.      . metoprolol succinate (TOPROL XL) 25 MG 24 hr tablet Take 1 tablet (25 mg total) by mouth daily.  90 tablet  3  . Multiple Vitamin (MULTIVITAMIN) tablet Take 1 tablet by mouth daily.      . rivaroxaban (XARELTO) 20 MG TABS tablet Take 1 tablet (20 mg total) by mouth daily.  25 tablet  3   No current facility-administered medications for this visit.    Past Medical History  Diagnosis Date  . Hypothyroidism   . Shingles   . Internal hemorrhoids   . Seasonal allergies   . Essential hypertension, benign   . Diverticulosis   . Paroxysmal atrial fibrillation     Past Surgical History  Procedure Laterality Date  . Abdominal hysterectomy    . Cataract extraction    . Colonoscopy N/A 01/12/2013    Procedure: COLONOSCOPY;  Surgeon: Rogene Houston, MD;  Location: [facility];  Service: Endoscopy;  Laterality: N/A;  1030    ROS: Review of systems complete and found to be negative unless listed above PHYSICAL EXAM There were no vitals taken for this visit. General: Well developed, well nourished, in no acute distress Head: Eyes PERRLA, No xanthomas.   Normal cephalic and atramatic  Lungs: Clear bilaterally to auscultation and percussion. Heart: HRRR S1 S2, without MRG.  Pulses are 2+ & equal.  No carotid bruit. No JVD.  No abdominal bruits. No femoral bruits. Abdomen: Bowel sounds are positive, abdomen soft and non-tender without masses or                  Hernia's noted. Msk:  Back normal, normal gait. Normal strength and tone for age. Extremities: No clubbing, cyanosis or edema.  DP +1 Neuro: Alert and oriented X 3. Psych:  Good affect, responds appropriately   EKG:  Atrial fibrillation rate of 85 bpm.  ASSESSMENT AND PLAN

## 2013-12-15 ENCOUNTER — Ambulatory Visit (INDEPENDENT_AMBULATORY_CARE_PROVIDER_SITE_OTHER): Payer: Medicare Other | Admitting: Adult Health

## 2013-12-15 ENCOUNTER — Encounter: Payer: Self-pay | Admitting: Adult Health

## 2013-12-15 VITALS — BP 138/70 | HR 81 | Ht 62.0 in | Wt 104.0 lb

## 2013-12-15 DIAGNOSIS — I10 Essential (primary) hypertension: Secondary | ICD-10-CM

## 2013-12-15 DIAGNOSIS — I4891 Unspecified atrial fibrillation: Secondary | ICD-10-CM

## 2013-12-15 DIAGNOSIS — I480 Paroxysmal atrial fibrillation: Secondary | ICD-10-CM

## 2013-12-15 DIAGNOSIS — R55 Syncope and collapse: Secondary | ICD-10-CM

## 2013-12-15 MED ORDER — METOPROLOL SUCCINATE ER 50 MG PO TB24: 50.0000 mg | ORAL_TABLET | Freq: Every day | ORAL | Status: DC

## 2013-12-15 NOTE — Assessment & Plan Note (Signed)
The patient is very symptomatic with her heart rate being irregular, with exacerbations of heart rate up to the 90s. She will often take an extra metoprolol tartrate 25 mg in addition to the long-acting metoprolol succinate 25 mg which he takes at lunch time. She complains of nausea and generalized weakness fatigue lasting approximately 24 hours. This has been occurring approximately every other day and she is documented thoroughly her symptoms and blood pressures.  I will increase her Toprol-XL to 50 mg daily which she is to continue to take her lunch time. A new prescription is been provided. She is to continue take her blood pressure and heart rate in right encounter she has been doing already. She is advised she needs to, she can take extra doses of metoprolol tartrate should she begin to feel the tachycardia palpitations which are symptomatic to her.  She will follow up with Dr. Domenic Polite his next available appointment at which time he will discuss need to refer her to EP vs. place her on antiarrhythmic vs. more medication adjustments. Consider placing a cardiac monitor on the patient, but she is documented her heart rate fairly concisely frequently during the day and I do not see that her heart rate is in the greater than 100 beats per minute.  As explained to her, reassurance is given, further recommendations by Dr. Domenic Polite on future office visit

## 2013-12-15 NOTE — Progress Notes (Deleted)
Name: Shelly Fields    DOB: 1934/08/20  Age: 79 y.o.  MR#: [id number]       PCP:  Glenda Chroman., MD      Insurance: Payor: [employer] MEDICARE / Plan: AARP MEDICARE COMPLETE / Product Type: *No Product type* /   CC:    Chief Complaint  Patient presents with  . Palpitations  . Atrial Fibrillation    Paroxysmal    VS Filed Vitals:   12/15/13 1457  BP: 138/70  Pulse: 81  Height: 5\' 2"  (1.575 m)  Weight: 104 lb (47.174 kg)    Weights Current Weight  12/15/13 104 lb (47.174 kg)  10/26/13 101 lb (45.813 kg)  03/15/13 98 lb (44.453 kg)    Blood Pressure  BP Readings from Last 3 Encounters:  12/15/13 138/70  10/26/13 162/81  03/15/13 125/79     Admit date:  (Not on file) Last encounter with RMR:  Visit date not found   Allergy Review of patient's allergies indicates no known allergies.  Current Outpatient Prescriptions  Medication Sig Dispense Refill  . acyclovir (ZOVIRAX) 200 MG capsule Take 1 capsule by mouth as needed.      . calcium carbonate (OS-CAL) 600 MG TABS Take 600 mg by mouth 2 (two) times daily with a meal.      . Cholecalciferol (VITAMIN D3) 5000 UNITS CAPS Take 5,000 Units by mouth daily.      Marland Kitchen docusate sodium (COLACE) 100 MG capsule Take 200 mg by mouth at bedtime.       Marland Kitchen levothyroxine (SYNTHROID, LEVOTHROID) 50 MCG tablet Take 50 mcg by mouth daily.      . metoprolol succinate (TOPROL XL) 25 MG 24 hr tablet Take 1 tablet (25 mg total) by mouth daily.  90 tablet  3  . Multiple Vitamin (MULTIVITAMIN) tablet Take 1 tablet by mouth daily.      . rivaroxaban (XARELTO) 20 MG TABS tablet Take 1 tablet (20 mg total) by mouth daily.  25 tablet  3   No current facility-administered medications for this visit.    Discontinued Meds:   There are no discontinued medications.  Patient Active Problem List   Diagnosis Date Noted  . Syncope 06/23/2012  . Atrial fibrillation 03/14/2012  . Essential hypertension, benign 03/14/2012    LABS No results found  for this basename: na, k, cl, co2, glucose, bun, creatinine, calcium, gfrnonaa, gfraa   CMP  No results found for this basename: na, k, cl, co2, glucose, bun, creatinine, calcium, prot, albumin, ast, alt, alkphos, bilitot, gfrnonaa, gfraa    No results found for this basename: wbc, hgb, hct, mcv, platelets    Lipid Panel  No results found for this basename: chol, trig, hdl, cholhdl, vldl, ldlcalc    ABG No results found for this basename: phart, pco2, pco2art, po2, po2art, hco3, tco2, acidbasedef, o2sat     No results found for this basename: TSH   BNP (last 3 results) No results found for this basename: PROBNP,  in the last 8760 hours Cardiac Panel (last 3 results) No results found for this basename: CKTOTAL, CKMB, TROPONINI, RELINDX,  in the last 72 hours  Iron/TIBC/Ferritin/ %Sat No results found for this basename: iron, tibc, ferritin, ironpctsat     EKG Orders placed in visit on 10/26/13  . EKG 12-LEAD     Prior Assessment and Plan Problem List as of 12/15/2013     Cardiovascular and Mediastinum   Atrial fibrillation   Last Assessment & Plan  10/26/2013 Office Visit Written 10/26/2013 11:50 AM by Satira Sark, MD     Paroxysmal. She remains comfortable with current symptom management on the present regimen. I did discuss with her the possibility of antiarrhythmic treatment if her symptoms become more frequent or intense. For now no changes were made.    Essential hypertension, benign   Last Assessment & Plan   10/26/2013 Office Visit Written 10/26/2013 11:50 AM by Satira Sark, MD     Blood pressure rechecked today by me, 128/88. No changes made.    Syncope   Last Assessment & Plan   07/27/2012 Office Visit Written 07/27/2012  3:01 PM by Satira Sark, MD     Suspect that this episode was neurocardiogenic in etiology based on her description. Will continue to monitor for now.        Imaging: No results found.

## 2013-12-15 NOTE — Assessment & Plan Note (Signed)
No complaints of this, no dizziness, associated with frequent palpitations.

## 2013-12-15 NOTE — Assessment & Plan Note (Signed)
Blood pressure today is well-controlled 138/70, her heart rate is 81 beats per minute. We will follow closely with increased dose of metoprolol as this will also cause some decrease in blood pressure. She is not on any other antihypertensive medications on reviewing her medication list.

## 2013-12-15 NOTE — Patient Instructions (Addendum)
Your physician recommends that you schedule a follow-up appointment in: 2-3 weeks with Dr.McDowell      Your physician has recommended you make the following change in your medication:     INCREASE Toprol XL to 50 mg daily     Thank you for choosing [facility] !

## 2013-12-18 ENCOUNTER — Encounter: Payer: Self-pay | Admitting: Adult Health

## 2013-12-27 ENCOUNTER — Ambulatory Visit (INDEPENDENT_AMBULATORY_CARE_PROVIDER_SITE_OTHER): Payer: Medicare Other | Admitting: Cardiology

## 2013-12-27 ENCOUNTER — Encounter: Payer: Self-pay | Admitting: Cardiology

## 2013-12-27 VITALS — BP 130/75 | HR 68 | Ht 62.0 in | Wt 102.0 lb

## 2013-12-27 DIAGNOSIS — I10 Essential (primary) hypertension: Secondary | ICD-10-CM

## 2013-12-27 DIAGNOSIS — I480 Paroxysmal atrial fibrillation: Secondary | ICD-10-CM

## 2013-12-27 DIAGNOSIS — I4891 Unspecified atrial fibrillation: Secondary | ICD-10-CM

## 2013-12-27 MED ORDER — RIVAROXABAN 20 MG PO TABS: 20.0000 mg | ORAL_TABLET | Freq: Every day | ORAL | Status: DC

## 2013-12-27 NOTE — Patient Instructions (Signed)
Your physician recommends that you schedule a follow-up appointment in: 4 months. You will receive a reminder letter in the mail in about 2 months reminding you to call and schedule your appointment. If you don't receive this letter, please contact our office. Your physician recommends that you continue on your current medications as directed. Please refer to the Current Medication list given to you today. 

## 2013-12-27 NOTE — Assessment & Plan Note (Signed)
No other change to current regimen.

## 2013-12-27 NOTE — Assessment & Plan Note (Signed)
Paroxysmal, currently in sinus rhythm and symptomatically well controlled. She will continue Xarelto and Toprol-XL at 50 mg daily. If symptoms of recurrent palpitations remain a problem, we might consider starting flecainide at low dose and advancing from there. Followup scheduled over the next 4 months.

## 2013-12-27 NOTE — Progress Notes (Signed)
Clinical Summary Shelly Fields is a 79 y.o.female just seen in the office by Ms. Lawrence NP on July 10. At that time Toprol-XL was increased to 50 mg daily in light of recurring palpitations. ECG from July 10 showed atrial fibrillation - she had previously been in sinus rhythm as of May. She states that within the following 24 hours she felt much better her heart rate had stabilized. Today she is in sinus rhythm.  Recent lab work from June showed hemoglobin 14.3, platelets 363, BUN 11, creatinine 0.8, potassium 4.4, normal LFTs, cholesterol 173, triglycerides 84, HDL 73, LDL 83, TSH 2.0.  We discussed possible antiarrhythmic treatment, flecainide would be a choice. For now she wanted to be continued longer on the higher dose beta blocker to see if this was enough to keep her symptoms at [place].  No Known Allergies  Current Outpatient Prescriptions  Medication Sig Dispense Refill  . acyclovir (ZOVIRAX) 200 MG capsule Take 1 capsule by mouth as needed.      . calcium carbonate (OS-CAL) 600 MG TABS Take 600 mg by mouth 2 (two) times daily with a meal.      . Cholecalciferol (VITAMIN D3) 5000 UNITS CAPS Take 5,000 Units by mouth daily.      Marland Kitchen docusate sodium (COLACE) 100 MG capsule Take 200 mg by mouth at bedtime.       Marland Kitchen levothyroxine (SYNTHROID, LEVOTHROID) 50 MCG tablet Take 50 mcg by mouth daily.      . metoprolol succinate (TOPROL XL) 50 MG 24 hr tablet Take 1 tablet (50 mg total) by mouth daily.  90 tablet  3  . Multiple Vitamin (MULTIVITAMIN) tablet Take 1 tablet by mouth daily.      . rivaroxaban (XARELTO) 20 MG TABS tablet Take 1 tablet (20 mg total) by mouth daily.  25 tablet  3   No current facility-administered medications for this visit.    Past Medical History  Diagnosis Date  . Hypothyroidism   . Shingles   . Internal hemorrhoids   . Seasonal allergies   . Essential hypertension, benign   . Diverticulosis   . Paroxysmal atrial fibrillation     Social History Shelly Fields  reports that she has never smoked. She has never used smokeless tobacco. Ms. Mcbryar reports that she does not drink alcohol.  Review of Systems No bleeding problems. No recent palpitations or chest pain. Other systems reviewed and negative.  Physical Examination Filed Vitals:   12/27/13 1435  BP: 130/75  Pulse: 68   Filed Weights   12/27/13 1435  Weight: 102 lb (46.267 kg)    Thin woman in no acute distress.  HEENT: Conjunctiva and lids normal, oropharynx clear.  Neck: Supple, no elevated JVP or carotid bruits, no thyromegaly.  Lungs: Clear to auscultation, nonlabored breathing at rest.  Cardiac: Regular rate and rhythm, no S3, soft systolic murmur, no pericardial rub.  Abdomen: Soft, nontender, bowel sounds present, no guarding or rebound.  Extremities: No pitting edema, distal pulses 2+.  Skin: Warm and dry.  Musculoskeletal: No kyphosis.  Neuropsychiatric: Alert and oriented x3, affect appropriate.   Problem List and Plan   Atrial fibrillation Paroxysmal, currently in sinus rhythm and symptomatically well controlled. She will continue Xarelto and Toprol-XL at 50 mg daily. If symptoms of recurrent palpitations remain a problem, we might consider starting flecainide at low dose and advancing from there. Followup scheduled over the next 4 months.  Essential hypertension, benign No other change to current regimen.  Satira Sark, M.D., F.A.C.C.

## 2014-02-27 ENCOUNTER — Other Ambulatory Visit: Payer: Self-pay | Admitting: *Deleted

## 2014-02-27 MED ORDER — RIVAROXABAN 20 MG PO TABS: 20.0000 mg | ORAL_TABLET | Freq: Every day | ORAL | Status: DC

## 2014-04-02 ENCOUNTER — Other Ambulatory Visit: Payer: Self-pay | Admitting: *Deleted

## 2014-04-02 MED ORDER — METOPROLOL SUCCINATE ER 50 MG PO TB24: 50.0000 mg | ORAL_TABLET | Freq: Every day | ORAL | Status: DC

## 2014-04-04 ENCOUNTER — Telehealth: Payer: Self-pay | Admitting: Cardiology

## 2014-04-04 NOTE — Telephone Encounter (Signed)
Patient said she thinks she has been having atrial fibrillation episodes since her last visit. Patient was given an appointment for 04/11/14 with Dr. Domenic Polite.

## 2014-04-04 NOTE — Telephone Encounter (Signed)
Shelly Fields called stating that she has had 6 episodes of a-fib since July. Wants to talk to a nurse.

## 2014-04-11 ENCOUNTER — Other Ambulatory Visit: Payer: Self-pay | Admitting: *Deleted

## 2014-04-11 ENCOUNTER — Encounter: Payer: Self-pay | Admitting: Cardiology

## 2014-04-11 ENCOUNTER — Encounter: Payer: Self-pay | Admitting: *Deleted

## 2014-04-11 ENCOUNTER — Ambulatory Visit (INDEPENDENT_AMBULATORY_CARE_PROVIDER_SITE_OTHER): Payer: Medicare Other | Admitting: Cardiology

## 2014-04-11 VITALS — BP 162/77 | HR 65 | Ht 62.0 in | Wt 101.0 lb

## 2014-04-11 DIAGNOSIS — I480 Paroxysmal atrial fibrillation: Secondary | ICD-10-CM

## 2014-04-11 DIAGNOSIS — I10 Essential (primary) hypertension: Secondary | ICD-10-CM

## 2014-04-11 MED ORDER — FLECAINIDE ACETATE 50 MG PO TABS: 50.0000 mg | ORAL_TABLET | Freq: Two times a day (BID) | ORAL | Status: DC

## 2014-04-11 MED ORDER — RIVAROXABAN 20 MG PO TABS: 20.0000 mg | ORAL_TABLET | Freq: Every day | ORAL | Status: DC

## 2014-04-11 NOTE — Assessment & Plan Note (Signed)
Symptoms increasing as noted above. Plan is to start flecainide 50 mg twice daily, continue Toprol-XL and Xarelto. Follow-up GXT in 2 weeks. I will see her back in the office in the next few months.

## 2014-04-11 NOTE — Progress Notes (Signed)
   Reason for visit: Atrial fibrillation  Clinical Summary [name] is a 79 y.o.female last seen in July. She presents with increasing breakthrough atrial fibrillation, has a sense of palpitations and weakness the can last for several hours at a time. This has been occurring at least a few times a mild, sometimes more often. She otherwise continues her Toprol-XL and Xarelto. Also stays active, has been an exercise class recently. He does not experience any exertional chest pain. ECG today shows sinus rhythm with PACs.  At the last visit we did discuss initiating low-dose flecainide if she kept having recurring episodes of atrial fibrillation.we reviewed this option again today.  No Known Allergies  Current Outpatient Prescriptions  Medication Sig Dispense Refill  . acyclovir (ZOVIRAX) 200 MG capsule Take 1 capsule by mouth as needed.    . calcium carbonate (OS-CAL) 600 MG TABS Take 600 mg by mouth 2 (two) times daily with a meal.    . Cholecalciferol (VITAMIN D3) 5000 UNITS CAPS Take 5,000 Units by mouth daily.    Marland Kitchen docusate sodium (COLACE) 100 MG capsule Take 200 mg by mouth at bedtime.     Marland Kitchen levothyroxine (SYNTHROID, LEVOTHROID) 50 MCG tablet Take 50 mcg by mouth daily.    . metoprolol succinate (TOPROL-XL) 50 MG 24 hr tablet Take 1 tablet (50 mg total) by mouth daily. 90 tablet 3  . Multiple Vitamin (MULTIVITAMIN) tablet Take 1 tablet by mouth daily.    . rivaroxaban (XARELTO) 20 MG TABS tablet Take 1 tablet (20 mg total) by mouth daily. 30 tablet 0  . flecainide (TAMBOCOR) 50 MG tablet Take 1 tablet (50 mg total) by mouth 2 (two) times daily. 180 tablet 3   No current facility-administered medications for this visit.    Past Medical History  Diagnosis Date  . Hypothyroidism   . Shingles   . Internal hemorrhoids   . Seasonal allergies   . Essential hypertension, benign   . Diverticulosis   . Paroxysmal atrial fibrillation     Social History [name] reports that she has  never smoked. She has never used smokeless tobacco. [name] reports that she does not drink alcohol.  Review of Systems Complete review of systems negative except as otherwise outlined in the clinical summary.  Physical Examination Filed Vitals:   04/11/14 1511  BP: 162/77  Pulse: 65   Filed Weights   04/11/14 1511  Weight: 101 lb (45.813 kg)    Thin woman in no acute distress.  HEENT: Conjunctiva and lids normal, oropharynx clear.  Neck: Supple, no elevated JVP or carotid bruits, no thyromegaly.  Lungs: Clear to auscultation, nonlabored breathing at rest.  Cardiac: Regular rate and rhythm with occasional ectopy, no S3, soft systolic murmur, no pericardial rub.  Abdomen: Soft, nontender, bowel sounds present, no guarding or rebound.  Extremities: No pitting edema, distal pulses 2+.  Skin: Warm and dry.  Musculoskeletal: No kyphosis.  Neuropsychiatric: Alert and oriented x3, affect appropriate.   Problem List and Plan   Paroxysmal atrial fibrillation Symptoms increasing as noted above. Plan is to start flecainide 50 mg twice daily, continue Toprol-XL and Xarelto. Follow-up GXT in 2 weeks. I will see her back in the office in the next few months.  Essential hypertension, benign Blood pressure elevated today, but this seems to be an isolated reading. We reviewed her home blood pressure checks showing much better control.    Satira Sark, M.D., F.A.C.C.

## 2014-04-11 NOTE — Patient Instructions (Signed)
Your physician recommends that you schedule a follow-up appointment in: 2 months. Your physician has recommended you make the following change in your medication:  Start flecainide 50 mg twice daily. Continue all other medications the same. Your physician has requested that you have an exercise tolerance test. For further information please visit [email or web address]. Please also follow instruction sheet, as given.

## 2014-04-11 NOTE — Assessment & Plan Note (Signed)
Blood pressure elevated today, but this seems to be an isolated reading. We reviewed her home blood pressure checks showing much better control.

## 2014-04-12 ENCOUNTER — Encounter: Payer: Self-pay | Admitting: *Deleted

## 2014-04-12 ENCOUNTER — Telehealth: Payer: Self-pay | Admitting: Cardiology

## 2014-04-12 MED ORDER — FLECAINIDE ACETATE 50 MG PO TABS: 50.0000 mg | ORAL_TABLET | Freq: Two times a day (BID) | ORAL | Status: DC

## 2014-04-12 NOTE — Telephone Encounter (Signed)
Called and canceled prescription sent to [facility] yesterday for flecainide. Patient aware and apologized to for sending rx to wrong pharmacy.

## 2014-04-12 NOTE — Telephone Encounter (Signed)
This encounter was created in error - please disregard.

## 2014-04-12 NOTE — Telephone Encounter (Signed)
Dis regard cell number in previous message this is correct one [phone]

## 2014-04-12 NOTE — Telephone Encounter (Signed)
Shelly Fields went to get her RX yesterday at [facility] in [place]. They told her yesterday and this morning that they did not receive a RX for the new medication that she was to start yesterday.  Please call her at [phone] Cell phone

## 2014-04-26 ENCOUNTER — Ambulatory Visit (HOSPITAL_COMMUNITY)
Admission: RE | Admit: 2014-04-26 | Discharge: 2014-04-26 | Disposition: A | Discharge status: Home / Self Care | Payer: Medicare Other | Source: Ambulatory Visit | Attending: Cardiology | Admitting: Cardiology

## 2014-04-26 DIAGNOSIS — I4891 Unspecified atrial fibrillation: Secondary | ICD-10-CM | POA: Diagnosis present

## 2014-04-26 DIAGNOSIS — I480 Paroxysmal atrial fibrillation: Secondary | ICD-10-CM

## 2014-04-26 NOTE — Progress Notes (Addendum)
Stress Lab Nurses Notes - [place] [facility] 04/26/2014 Reason for doing test: Atrial Fib / flecainide Type of test: Regular GTX Nurse performing test: Gerrit Halls, RN Nuclear Medicine Tech: Not Applicable Echo Tech: Not Applicable MD performing test: Avagrace Botelho/K.Purcell Nails NP Family MD: Woody Seller Test explained and consent signed: Yes.   IV started: No IV started Symptoms: Fatigue Treatment/Intervention: None Reason test stopped: fatigue After recovery IV was: NA Patient to return to Nuc. Med at : NA Patient discharged: Home Patient's Condition upon discharge was: stable Comments: During test peak BP 193/104 & HR 136.  Recovery BP 141/84 & HR 77.  Symptoms resolved in recovery. Geanie Cooley T   ATTENDING PHYSICIAN ADDENDUM: Resting ECG demonstrated atrial fibrillation, HR 75 bpm. With exercise, there were no gross ischemic ST segment or T wave abnormalities noted. Few isolated PVC's noted. No chest pain was reported. Duke treadmill score of 5, predicting a low risk of cardiac events.

## 2014-04-27 ENCOUNTER — Telehealth: Payer: Self-pay | Admitting: *Deleted

## 2014-04-27 NOTE — Telephone Encounter (Signed)
Satira Sark, MD   Sent: Fri April 27, 2014 8:32 AM    To: Merlene Laughter, LPN        Message     Please see GXT report on this patient. Low risk test, no proarrhythmia. Continue current medicines including flecainide.

## 2014-04-27 NOTE — Telephone Encounter (Signed)
Patient informed. 

## 2014-06-14 ENCOUNTER — Ambulatory Visit (INDEPENDENT_AMBULATORY_CARE_PROVIDER_SITE_OTHER): Payer: Medicare Other | Admitting: Cardiology

## 2014-06-14 ENCOUNTER — Encounter: Payer: Self-pay | Admitting: Cardiology

## 2014-06-14 VITALS — BP 118/82 | HR 53 | Ht 62.0 in | Wt 100.0 lb

## 2014-06-14 DIAGNOSIS — I10 Essential (primary) hypertension: Secondary | ICD-10-CM

## 2014-06-14 DIAGNOSIS — I480 Paroxysmal atrial fibrillation: Secondary | ICD-10-CM

## 2014-06-14 MED ORDER — RIVAROXABAN 20 MG PO TABS: 20.0000 mg | ORAL_TABLET | Freq: Every day | ORAL | Status: DC

## 2014-06-14 NOTE — Progress Notes (Signed)
   Reason for visit: Atrial fibrillation  Clinical Summary Ms. Pakistan is a 80 y.o.female last seen in November 2015. At that time we started flecainide for further control of symptomatic, paroxysmal atrial fibrillation. She has been doing well so far, no chest pain, better control of intermittent palpitations. She has a regular exercise plan.  Follow-up GXT was low risk without obvious pro-arrhythmia, few isolated PVCs noted. We discussed continuing her current regimen.   No Known Allergies  Current Outpatient Prescriptions  Medication Sig Dispense Refill  . acyclovir (ZOVIRAX) 200 MG capsule Take 1 capsule by mouth as needed.    . calcium carbonate (OS-CAL) 600 MG TABS Take 600 mg by mouth 2 (two) times daily with a meal.    . Cholecalciferol (VITAMIN D3) 5000 UNITS CAPS Take 5,000 Units by mouth daily.    Marland Kitchen docusate sodium (COLACE) 100 MG capsule Take 200 mg by mouth at bedtime.     . flecainide (TAMBOCOR) 50 MG tablet Take 1 tablet (50 mg total) by mouth 2 (two) times daily. 180 tablet 3  . levothyroxine (SYNTHROID, LEVOTHROID) 50 MCG tablet Take 50 mcg by mouth daily.    . metoprolol succinate (TOPROL-XL) 50 MG 24 hr tablet Take 1 tablet (50 mg total) by mouth daily. 90 tablet 3  . Multiple Vitamin (MULTIVITAMIN) tablet Take 1 tablet by mouth daily.    . rivaroxaban (XARELTO) 20 MG TABS tablet Take 1 tablet (20 mg total) by mouth daily. 20 tablet 0   No current facility-administered medications for this visit.    Past Medical History  Diagnosis Date  . Hypothyroidism   . Shingles   . Internal hemorrhoids   . Seasonal allergies   . Essential hypertension, benign   . Diverticulosis   . Paroxysmal atrial fibrillation     Social History Ms. Pakistan reports that she has never smoked. She has never used smokeless tobacco. Ms. Smotherman reports that she does not drink alcohol.  Review of Systems Complete review of systems negative except as otherwise outlined in the clinical  summary and also the following. No bleeding problems on Xarelto.  Physical Examination Filed Vitals:   06/14/14 1102  BP: 118/82  Pulse: 53   Filed Weights   06/14/14 1102  Weight: 100 lb (45.36 kg)    Thin woman in no acute distress.  HEENT: Conjunctiva and lids normal, oropharynx clear.  Neck: Supple, no elevated JVP or carotid bruits, no thyromegaly.  Lungs: Clear to auscultation, nonlabored breathing at rest.  Cardiac: Regular rate and rhythm with occasional ectopy, no S3, soft systolic murmur, no pericardial rub.  Abdomen: Soft, nontender, bowel sounds present, no guarding or rebound.  Extremities: No pitting edema, distal pulses 2+.    Problem List and Plan   Paroxysmal atrial fibrillation Continue current strategy, she is tolerating flecainide. No change in Toprol-XL dosing. No bleeding problems on Xarelto. Follow-up in 6 months, sooner if needed.  Essential hypertension, benign Blood pressure is normal today.    Satira Sark, M.D., F.A.C.C.

## 2014-06-14 NOTE — Patient Instructions (Signed)

## 2014-06-14 NOTE — Assessment & Plan Note (Signed)
Blood pressure is normal today. 

## 2014-06-14 NOTE — Assessment & Plan Note (Signed)
Continue current strategy, she is tolerating flecainide. No change in Toprol-XL dosing. No bleeding problems on Xarelto. Follow-up in 6 months, sooner if needed.

## 2014-06-26 ENCOUNTER — Other Ambulatory Visit: Payer: Self-pay | Admitting: *Deleted

## 2014-06-26 MED ORDER — FLECAINIDE ACETATE 50 MG PO TABS: 50.0000 mg | ORAL_TABLET | Freq: Two times a day (BID) | ORAL | Status: DC

## 2014-06-26 MED ORDER — METOPROLOL SUCCINATE ER 50 MG PO TB24: 50.0000 mg | ORAL_TABLET | Freq: Every day | ORAL | Status: DC

## 2014-08-09 ENCOUNTER — Other Ambulatory Visit: Payer: Self-pay | Admitting: *Deleted

## 2014-08-09 MED ORDER — RIVAROXABAN 20 MG PO TABS: 20.0000 mg | ORAL_TABLET | Freq: Every day | ORAL | Status: DC

## 2014-09-05 ENCOUNTER — Telehealth: Payer: Self-pay | Admitting: *Deleted

## 2014-09-05 ENCOUNTER — Other Ambulatory Visit: Payer: Self-pay | Admitting: *Deleted

## 2014-09-05 DIAGNOSIS — I10 Essential (primary) hypertension: Secondary | ICD-10-CM

## 2014-09-05 DIAGNOSIS — I480 Paroxysmal atrial fibrillation: Secondary | ICD-10-CM

## 2014-09-05 MED ORDER — FUROSEMIDE 20 MG PO TABS: 20.0000 mg | ORAL_TABLET | Freq: Every day | ORAL | Status: DC | PRN

## 2014-09-05 NOTE — Telephone Encounter (Addendum)
Patient called with c/o of having increased swelling in her bilateral LE over the past 2 weeks. No c/o chest pain, sob, or dizziness. Patient denies having to sleep sitting up. Patient c/o that during exercise she has to stop sooner than normal due to fatigue. Also patient c/o, after walking across the yard, her legs feel tired. No change in diet or increased sodium. Patient doesn't have compression stockings. Patient is concerned that the swelling may be coming from her flecainide that she started in November 2015. Appointment given to patient to see Domenic Polite on 09/11/14. Please advise.

## 2014-09-05 NOTE — Telephone Encounter (Signed)
Flecanide would not typically cause that kind of swelling. Please order a BMET, Mg, cbc, and TSH. Pleas start her on lasix 20mg  daily PRN swelling (emphasize only to take as needed for swelling). Maintain her f/u with Dr Murlean Hark MD

## 2014-09-05 NOTE — Telephone Encounter (Signed)
Patient informed and verbalized understanding of plan. 

## 2014-09-06 LAB — CBC
HCT: 43.3 % (ref 36.0–46.0)
HEMOGLOBIN: 14.1 g/dL (ref 12.0–15.0)
MCH: 29 pg (ref 26.0–34.0)
MCHC: 32.6 g/dL (ref 30.0–36.0)
MCV: 88.9 fL (ref 78.0–100.0)
MPV: 9.8 fL (ref 8.6–12.4)
PLATELETS: 363 10*3/uL (ref 150–400)
RBC: 4.87 MIL/uL (ref 3.87–5.11)
RDW: 13.4 % (ref 11.5–15.5)
WBC: 9 10*3/uL (ref 4.0–10.5)

## 2014-09-06 LAB — BASIC METABOLIC PANEL
BUN: 13 mg/dL (ref 6–23)
CALCIUM: 9.5 mg/dL (ref 8.4–10.5)
CO2: 26 meq/L (ref 19–32)
Chloride: 93 mEq/L — ABNORMAL LOW (ref 96–112)
Creat: 0.77 mg/dL (ref 0.50–1.10)
Glucose, Bld: 85 mg/dL (ref 70–99)
Potassium: 4.7 mEq/L (ref 3.5–5.3)
SODIUM: 130 meq/L — AB (ref 135–145)

## 2014-09-06 LAB — TSH: TSH: 2.488 u[IU]/mL (ref 0.350–4.500)

## 2014-09-06 LAB — MAGNESIUM: MAGNESIUM: 2.1 mg/dL (ref 1.5–2.5)

## 2014-09-11 ENCOUNTER — Encounter: Payer: Self-pay | Admitting: Cardiology

## 2014-09-11 ENCOUNTER — Other Ambulatory Visit: Payer: Self-pay | Admitting: *Deleted

## 2014-09-11 ENCOUNTER — Ambulatory Visit (INDEPENDENT_AMBULATORY_CARE_PROVIDER_SITE_OTHER): Payer: Medicare Other | Admitting: Cardiology

## 2014-09-11 VITALS — BP 138/88 | HR 70 | Ht 62.0 in | Wt 101.0 lb

## 2014-09-11 DIAGNOSIS — M7989 Other specified soft tissue disorders: Secondary | ICD-10-CM | POA: Diagnosis not present

## 2014-09-11 DIAGNOSIS — I480 Paroxysmal atrial fibrillation: Secondary | ICD-10-CM

## 2014-09-11 DIAGNOSIS — R0602 Shortness of breath: Secondary | ICD-10-CM | POA: Diagnosis not present

## 2014-09-11 MED ORDER — RIVAROXABAN 20 MG PO TABS: 20.0000 mg | ORAL_TABLET | Freq: Every day | ORAL | Status: DC

## 2014-09-11 NOTE — Progress Notes (Signed)
Cardiology Office Note  Date: 09/11/2014   ID: Shelly Fields, DOB 1935/03/18, MRN [id number]  PCP: Glenda Chroman., MD  Primary Cardiologist: Rozann Lesches, MD   Chief Complaint  Patient presents with  . PAF  . Leg swelling    History of Present Illness: Shelly Fields is a 80 y.o. female last seen in January 2016. At that time we continued her on Toprol-XL, Xarelto, and flecainide for control of PAF. Recent telephone notes reviewed, patient complaining of bilateral leg swelling over the last few weeks, also some leg fatigue. Dr. Harl Bowie ordered follow-up lab work as noted below and started her on as needed Lasix.  She comes in today for a follow-up visit. Leg edema has been better with Lasix, not completely resolved. Although not common, swelling is listed as a potential side effect of flecainide. She tells her that she has had significant improvement in her feeling of palpitations on flecainide, but we have decided to try a period of time off of the medication to see if this helps her leg swelling. In addition, we discussed an echocardiogram to reassess heart structure and function and make sure that there have been no significant changes. Last echocardiogram from 2013 is noted below.  I reviewed her lab work, sodium was low at 130.   Past Medical History  Diagnosis Date  . Hypothyroidism   . Shingles   . Internal hemorrhoids   . Seasonal allergies   . Essential hypertension, benign   . Diverticulosis   . Paroxysmal atrial fibrillation     Past Surgical History  Procedure Laterality Date  . Abdominal hysterectomy    . Cataract extraction    . Colonoscopy N/A 01/12/2013    Procedure: COLONOSCOPY;  Surgeon: Shelly Houston, MD;  [facility];  Service: Endoscopy;  Laterality: N/A;  1030    Current Outpatient Prescriptions  Medication Sig Dispense Refill  . acyclovir (ZOVIRAX) 200 MG capsule Take 1 capsule by mouth as needed.    . calcium carbonate (OS-CAL) 600  MG TABS Take 600 mg by mouth 2 (two) times daily with a meal.    . Cholecalciferol (VITAMIN D3) 5000 UNITS CAPS Take 5,000 Units by mouth daily.    Marland Kitchen docusate sodium (COLACE) 100 MG capsule Take 200 mg by mouth at bedtime.     . flecainide (TAMBOCOR) 50 MG tablet Take 1 tablet (50 mg total) by mouth 2 (two) times daily. 180 tablet 3  . furosemide (LASIX) 20 MG tablet Take 1 tablet (20 mg total) by mouth daily as needed. 10 tablet 0  . levothyroxine (SYNTHROID, LEVOTHROID) 50 MCG tablet Take 50 mcg by mouth daily.    . metoprolol succinate (TOPROL-XL) 50 MG 24 hr tablet Take 1 tablet (50 mg total) by mouth daily. 90 tablet 3  . Multiple Vitamin (MULTIVITAMIN) tablet Take 1 tablet by mouth daily.    . rivaroxaban (XARELTO) 20 MG TABS tablet Take 1 tablet (20 mg total) by mouth daily. 90 tablet 3   No current facility-administered medications for this visit.    Allergies:  Review of patient's allergies indicates no known allergies.   Social History: The patient  reports that she has never smoked. She has never used smokeless tobacco. She reports that she does not drink alcohol or use illicit drugs.   ROS:  Please see the history of present illness. Otherwise, complete review of systems is positive for none.  All other systems are reviewed and negative.   Physical  Exam: VS:  BP 138/88 mmHg  Pulse 70  Ht 5\' 2"  (1.575 m)  Wt 101 lb (45.813 kg)  BMI 18.47 kg/m2  SpO2 97%, BMI Body mass index is 18.47 kg/(m^2).  Wt Readings from Last 3 Encounters:  09/11/14 101 lb (45.813 kg)  06/14/14 100 lb (45.36 kg)  04/11/14 101 lb (45.813 kg)     Thin woman in no acute distress.  HEENT: Conjunctiva and lids normal, oropharynx clear.  Neck: Supple, no elevated JVP or carotid bruits, no thyromegaly.  Lungs: Clear to auscultation, nonlabored breathing at rest.  Cardiac: Regular rate and rhythm with occasional ectopy, no S3, soft systolic murmur, no pericardial rub.  Abdomen: Soft, nontender,  bowel sounds present, no guarding or rebound.  Extremities: 1-2+ ankle edema, distal pulses 2+.    ECG: ECG is not ordered today.   Recent Labwork: 09/05/2014: BUN 13; Creatinine 0.77; Hemoglobin 14.1; Magnesium 2.1; Platelets 363; Potassium 4.7; Sodium 130*; TSH 2.488  Other Studies Reviewed Today:  Echocardiogram 04/06/2012: Study Conclusions  - Left ventricle: The cavity size was normal. There was mild focal basal hypertrophy of the septum. Systolic function was normal. The estimated ejection fraction was in the range of 60% to 65%. Wall motion was normal; there were no regional wall motion abnormalities. Features are consistent with a pseudonormal left ventricular filling pattern, with concomitant abnormal relaxation and increased filling pressure (grade 2 diastolic dysfunction). - Aortic valve: There was no stenosis. Trivial regurgitation. - Mitral valve: No significant regurgitation. - Right ventricle: The cavity size was normal. Systolic function was normal. - Right atrium: The atrium was mildly dilated. - Tricuspid valve: Mild-moderate regurgitation. - Pulmonary arteries: PA peak pressure: 85mm Hg (S). - Inferior vena cava: The vessel was normal in size; the respirophasic diameter changes were in the normal range (= 50%); findings are consistent with normal central venous pressure. Impressions:  - Normal LV size with mild focal basal septal hypertrophy. EF 60-65%. Moderate diastolic dysfunction. Mild right atrial enlargement. Normal RV size and systolic function.  Assessment and Plan:  1. Lower leg and ankle edema, improving with Lasix. For now I have asked her to stop flecainide to see if there is any association, take Lasix a few more days until her legs are back to baseline. We will also obtain a follow-up echocardiogram to reassess cardiac structure and function. Follow-up visit in a month with BMET.  2. Paroxysmal atrial  fibrillation. Continue Toprol-XL and Xarelto. We are trying a period of time off flecainide as noted.  Current medicines were reviewed with the patient today.   Orders Placed This Encounter  Procedures  . Basic metabolic panel  . 2D Echocardiogram without contrast    Disposition: FU with me in 1 month.   Signed, Satira Sark, MD, [facility] 09/11/2014 2:15 PM    [facility] [place], [place], [place] [address] Phone: [phone]; Fax: [phone]

## 2014-09-11 NOTE — Patient Instructions (Signed)
Your physician recommends that you schedule a follow-up appointment in: 1 month. Your physician recommends that you make the following changes to your medications: Hold flecainide. Please continue taking the furosemide for a few more days to help the swelling in your legs. Continue all other medications the same. Your physician has requested that you have an echocardiogram. Echocardiography is a painless test that uses sound waves to create images of your heart. It provides your doctor with information about the size and shape of your heart and how well your heart's chambers and valves are working. This procedure takes approximately one hour. There are no restrictions for this procedure. Your physician recommends that you have lab work in 1 month just before your next visit to check your BMET.

## 2014-09-19 ENCOUNTER — Other Ambulatory Visit: Payer: Self-pay

## 2014-09-19 ENCOUNTER — Other Ambulatory Visit (INDEPENDENT_AMBULATORY_CARE_PROVIDER_SITE_OTHER): Payer: Medicare Other

## 2014-09-19 DIAGNOSIS — R0602 Shortness of breath: Secondary | ICD-10-CM | POA: Diagnosis not present

## 2014-09-19 DIAGNOSIS — M7989 Other specified soft tissue disorders: Secondary | ICD-10-CM | POA: Diagnosis not present

## 2014-09-19 DIAGNOSIS — I480 Paroxysmal atrial fibrillation: Secondary | ICD-10-CM

## 2014-09-21 ENCOUNTER — Telehealth: Payer: Self-pay | Admitting: *Deleted

## 2014-09-21 MED ORDER — FUROSEMIDE 20 MG PO TABS: 20.0000 mg | ORAL_TABLET | Freq: Every day | ORAL | Status: DC | PRN

## 2014-09-21 NOTE — Telephone Encounter (Signed)
-----   Message from Satira Sark, MD sent at 09/20/2014  8:03 AM EDT ----- Reviewed report. LVEF low normal range, no description of diastolic function. PA systolic pressure in similar range. Please let her know that we will continue her present course with use of Lasix.

## 2014-09-21 NOTE — Telephone Encounter (Signed)
Patient informed. 

## 2014-10-10 ENCOUNTER — Other Ambulatory Visit: Payer: Self-pay | Admitting: Cardiology

## 2014-10-10 LAB — BASIC METABOLIC PANEL
BUN: 12 mg/dL (ref 6–23)
CALCIUM: 9.5 mg/dL (ref 8.4–10.5)
CO2: 27 mEq/L (ref 19–32)
CREATININE: 0.8 mg/dL (ref 0.50–1.10)
Chloride: 97 mEq/L (ref 96–112)
Glucose, Bld: 81 mg/dL (ref 70–99)
Potassium: 5 mEq/L (ref 3.5–5.3)
Sodium: 134 mEq/L — ABNORMAL LOW (ref 135–145)

## 2014-10-12 ENCOUNTER — Telehealth: Payer: Self-pay | Admitting: *Deleted

## 2014-10-12 NOTE — Telephone Encounter (Signed)
Patient informed. 

## 2014-10-12 NOTE — Telephone Encounter (Signed)
-----   Message from Satira Sark, MD sent at 10/11/2014  8:10 AM EDT ----- Reviewed. Sodium has improved, renal function remains normal.

## 2014-10-15 ENCOUNTER — Encounter: Payer: Self-pay | Admitting: Cardiology

## 2014-10-15 ENCOUNTER — Ambulatory Visit (INDEPENDENT_AMBULATORY_CARE_PROVIDER_SITE_OTHER): Payer: Medicare Other | Admitting: Cardiology

## 2014-10-15 VITALS — BP 133/83 | HR 87 | Ht 62.0 in | Wt 100.0 lb

## 2014-10-15 DIAGNOSIS — I480 Paroxysmal atrial fibrillation: Secondary | ICD-10-CM

## 2014-10-15 DIAGNOSIS — M7989 Other specified soft tissue disorders: Secondary | ICD-10-CM | POA: Diagnosis not present

## 2014-10-15 MED ORDER — RIVAROXABAN 20 MG PO TABS: 20.0000 mg | ORAL_TABLET | Freq: Every day | ORAL | Status: DC

## 2014-10-15 NOTE — Patient Instructions (Signed)
Your physician has recommended you make the following change in your medication: Restart flecainide 50 mg twice daily. Continue all other medications the same. Your physician recommends that you schedule a follow-up appointment in: 4 months. You will receive a reminder letter in the mail in about 2 months reminding you to call and schedule your appointment. If you don't receive this letter, please contact our office.

## 2014-10-15 NOTE — Progress Notes (Signed)
Cardiology Office Note  Date: 10/15/2014   ID: Shelly Fields, DOB 29-Jul-1934, MRN [id number]  PCP: Glenda Chroman., MD  Primary Cardiologist: Rozann Lesches, MD   Chief Complaint  Patient presents with  . PAF    History of Present Illness: Shelly Fields is a 80 y.o. female last seen in April. At that time she was reporting improved leg edema on Lasix, we also decided to try a period of tme off flecainide to see if this might also be associated. Follow-up echocardiogram was obtained and outlined below.  She comes in today for a follow-up visit. She did not notice any more improvement in leg edema off flecainide. She has had more palpitations, and we have decided to go back on flecainide for better rhythm suppression. Otherwise she is satisfied with her intermittent ankle edema which is fairly mild. I did review the results of her echocardiogram and also her recent lab work.   Past Medical History  Diagnosis Date  . Hypothyroidism   . Shingles   . Internal hemorrhoids   . Seasonal allergies   . Essential hypertension, benign   . Diverticulosis   . Paroxysmal atrial fibrillation     Current Outpatient Prescriptions  Medication Sig Dispense Refill  . acyclovir (ZOVIRAX) 200 MG capsule Take 1 capsule by mouth as needed.    . calcium carbonate (OS-CAL) 600 MG TABS Take 600 mg by mouth 2 (two) times daily with a meal.    . Cholecalciferol (VITAMIN D3) 5000 UNITS CAPS Take 5,000 Units by mouth daily.    Marland Kitchen docusate sodium (COLACE) 100 MG capsule Take 200 mg by mouth at bedtime.     . furosemide (LASIX) 20 MG tablet Take 1 tablet (20 mg total) by mouth daily as needed. 30 tablet 1  . levothyroxine (SYNTHROID, LEVOTHROID) 50 MCG tablet Take 50 mcg by mouth daily.    . metoprolol succinate (TOPROL-XL) 50 MG 24 hr tablet Take 1 tablet (50 mg total) by mouth daily. 90 tablet 3  . Multiple Vitamin (MULTIVITAMIN) tablet Take 1 tablet by mouth daily.    . rivaroxaban (XARELTO) 20 MG TABS  tablet Take 1 tablet (20 mg total) by mouth daily. 20 tablet 0  . flecainide (TAMBOCOR) 50 MG tablet Take 1 tablet (50 mg total) by mouth 2 (two) times daily. 180 tablet 3   No current facility-administered medications for this visit.    Allergies:  Review of patient's allergies indicates no known allergies.   Social History: The patient  reports that she has never smoked. She has never used smokeless tobacco. She reports that she does not drink alcohol or use illicit drugs.   ROS:  Please see the history of present illness. Otherwise, complete review of systems is positive for none.  All other systems are reviewed and negative.   Physical Exam: VS:  BP 133/83 mmHg  Pulse 87  Ht 5\' 2"  (1.575 m)  Wt 100 lb (45.36 kg)  BMI 18.29 kg/m2  SpO2 99%, BMI Body mass index is 18.29 kg/(m^2).  Wt Readings from Last 3 Encounters:  10/15/14 100 lb (45.36 kg)  09/11/14 101 lb (45.813 kg)  06/14/14 100 lb (45.36 kg)     Thin woman in no acute distress.  HEENT: Conjunctiva and lids normal, oropharynx clear.  Neck: Supple, no elevated JVP or carotid bruits, no thyromegaly.  Lungs: Clear to auscultation, nonlabored breathing at rest.  Cardiac: Regular rate and rhythm with occasional ectopy, no S3, soft systolic murmur, no  pericardial rub.  Abdomen: Soft, nontender, bowel sounds present, no guarding or rebound.  Extremities: Trace ankle edema, distal pulses 2+.     ECG: ECG is not ordered today.   Recent Labwork: 09/05/2014: Hemoglobin 14.1; Magnesium 2.1; Platelets 363; TSH 2.488 10/10/2014: BUN 12; Creatinine 0.80; Potassium 5.0; Sodium 134*   Other Studies Reviewed Today:  Echocardiogram 09/19/2014: Study Conclusions  - Left ventricle: The cavity size was normal. Wall thickness was increased in a pattern of mild LVH. Systolic function was normal. The estimated ejection fraction was in the range of 50% to 55%. Wall motion was normal; there were no regional wall  motion abnormalities. - Aortic valve: There was mild regurgitation. Valve area (VTI): 2.41 cm^2. Valve area (Vmax): 1.98 cm^2. Valve area (Vmean): 2.3 cm^2. - Right ventricle: The cavity size was moderately dilated. - Right atrium: The atrium was moderately dilated. - Atrial septum: No defect or patent foramen ovale was identified. - Tricuspid valve: There was moderate regurgitation. The TR VC is 0.5 cm. - Pulmonary arteries: PA peak pressure: 31 mm Hg (S). PASP is borderline elevated. - Inferior vena cava: The vessel was dilated. The respirophasic diameter changes were blunted (< 50%), consistent with elevated central venous pressure. - Technically adequate study.   Assessment and Plan:  1. Leg edema, improved. She has Lasix to use as needed.  2. Paroxysmal atrial fibrillation. We are going to resume flecainide 50 mg twice daily for better rhythm suppression, also continue Xarelto and Toprol-XL.  Current medicines were reviewed with the patient today.  No orders of the defined types were placed in this encounter.    Disposition: FU with me in 4 months.   Signed, Satira Sark, MD, [facility] 10/15/2014 2:45 PM    [facility] [place], [place], [place] [address] Phone: [phone]; Fax: [phone]

## 2014-12-03 ENCOUNTER — Other Ambulatory Visit: Payer: Self-pay

## 2015-01-04 ENCOUNTER — Encounter (INDEPENDENT_AMBULATORY_CARE_PROVIDER_SITE_OTHER): Payer: Self-pay | Admitting: *Deleted

## 2015-01-08 ENCOUNTER — Ambulatory Visit (INDEPENDENT_AMBULATORY_CARE_PROVIDER_SITE_OTHER): Payer: Medicare Other | Admitting: Internal Medicine

## 2015-01-08 ENCOUNTER — Encounter (INDEPENDENT_AMBULATORY_CARE_PROVIDER_SITE_OTHER): Payer: Self-pay | Admitting: Internal Medicine

## 2015-01-08 VITALS — BP 122/82 | HR 80 | Temp 97.9°F | Ht 62.0 in | Wt 97.9 lb

## 2015-01-08 DIAGNOSIS — K648 Other hemorrhoids: Secondary | ICD-10-CM | POA: Diagnosis not present

## 2015-01-08 DIAGNOSIS — K625 Hemorrhage of anus and rectum: Secondary | ICD-10-CM

## 2015-01-08 NOTE — Progress Notes (Addendum)
Subjective:    Patient ID: Shelly Fields, female    DOB: Nov 16, 1934, 80 y.o.   MRN: [id number]  HPI Presents today with c/o that she went for a wellness physical  [facility]) she had a positive stool card. She tells me she has hemorrhoids and has seen some blood in her panties.  She says her stools are brown.  She has not had any changes in her stools. She says she eats bran every day. She uses stool softener daily. Appetite is good. No weight loss.      12/20/2014 H and H 15.0 and 46.5, MCV 89, platelet ct 287 Total bili 0.7, ALP 97, AST 26, ALT 29 CBC    Component Value Date/Time   WBC 9.0 09/05/2014 1643   RBC 4.87 09/05/2014 1643   HGB 14.1 09/05/2014 1643   HCT 43.3 09/05/2014 1643   PLT 363 09/05/2014 1643   MCV 88.9 09/05/2014 1643   MCH 29.0 09/05/2014 1643   MCHC 32.6 09/05/2014 1643   RDW 13.4 09/05/2014 1643      01/12/2013 Colonoscopy  Indications: Patient is 80 year old Caucasian female with history of colonic adenoma and family history of colonic polyps.  Impression:  Examination performed to cecum. 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 Left-sided diverticulosis. External hemorrhoids and single anal papilla. No evidence of recurrent polyps.  Recommendations:  Standard instructions given. Patient can wait 10 years before next exam.    Review of Systems Past Medical History  Diagnosis Date  . Hypothyroidism   . Shingles   . Internal hemorrhoids   . Seasonal allergies   . Essential hypertension, benign   . Diverticulosis   . Paroxysmal atrial fibrillation     Past Surgical History  Procedure Laterality Date  . Abdominal hysterectomy    . Cataract extraction    . Colonoscopy N/A 01/12/2013    Procedure: COLONOSCOPY;  Surgeon: Rogene Houston, MD;  [facility];  Service: Endoscopy;  Laterality: N/A;  1030    No Known Allergies  Current Outpatient Prescriptions on File Prior to Visit  Medication Sig  Dispense Refill  . calcium carbonate (OS-CAL) 600 MG TABS Take 600 mg by mouth 2 (two) times daily with a meal.    . Cholecalciferol (VITAMIN D3) 5000 UNITS CAPS Take 5,000 Units by mouth daily.    Marland Kitchen docusate sodium (COLACE) 100 MG capsule Take 200 mg by mouth at bedtime.     . flecainide (TAMBOCOR) 50 MG tablet Take 1 tablet (50 mg total) by mouth 2 (two) times daily. 180 tablet 3  . furosemide (LASIX) 20 MG tablet Take 1 tablet (20 mg total) by mouth daily as needed. 30 tablet 1  . levothyroxine (SYNTHROID, LEVOTHROID) 50 MCG tablet Take 25 mcg by mouth daily.     . metoprolol succinate (TOPROL-XL) 50 MG 24 hr tablet Take 1 tablet (50 mg total) by mouth daily. 90 tablet 3  . Multiple Vitamin (MULTIVITAMIN) tablet Take 1 tablet by mouth daily.    . rivaroxaban (XARELTO) 20 MG TABS tablet Take 1 tablet (20 mg total) by mouth daily. 25 tablet 0  . acyclovir (ZOVIRAX) 200 MG capsule Take 1 capsule by mouth as needed.     No current facility-administered medications on file prior to visit.        Objective:   Physical Exam Blood pressure 122/82, pulse 80, temperature 97.9 F (36.6 C), height 5\' 2"  (1.575 m), weight 97 lb 14.4 oz (44.407 kg).  Alert  and oriented. Skin warm and dry. Oral mucosa is moist.   . Sclera anicteric, conjunctivae is pink. Thyroid not enlarged. No cervical lymphadenopathy. Lungs clear. Heart regular rate and rhythm.  Abdomen is soft. Bowel sounds are positive. No hepatomegaly. No abdominal masses felt. No tenderness.  No edema to lower extremities.  Rectal exam reveals hemorrhoids. Stool was brown and guaiac negative.     Developer: [id number] ( 04/2018) Card: Lot [id number] (12/18)     Assessment & Plan:  Hem positive stool card. Will get a CBC today. Stool cards x 3 .  Further recommendations to follow. Suspect she may have bled from her hemorrhoids. Last colonoscopy in 2014.

## 2015-01-08 NOTE — Patient Instructions (Signed)
Stool cards x 3 . CBC today

## 2015-01-11 ENCOUNTER — Encounter (INDEPENDENT_AMBULATORY_CARE_PROVIDER_SITE_OTHER): Payer: Self-pay

## 2015-01-14 ENCOUNTER — Telehealth (INDEPENDENT_AMBULATORY_CARE_PROVIDER_SITE_OTHER): Payer: Self-pay | Admitting: *Deleted

## 2015-01-14 NOTE — Telephone Encounter (Signed)
Forwarded to Terri to address. 

## 2015-01-14 NOTE — Telephone Encounter (Signed)
   Diagnosis:    Result(s)   Card 1:Negative:     Card 2:  Negative:   Card 3: Negative:    Completed by: Jolin Benavides,LPN   HEMOCCULT SENSA DEVELOPER: LOT#:  [id number] EXPIRATION DATE: 9-17   HEMOCCULT SENSA CARD:  LOT#:  [id number] EXPIRATION DATE: 07/18   CARD CONTROL RESULTS:  POSITIVE: Positive NEGATIVE: Negative    ADDITIONAL COMMENTS: Forwarded to [place] to review.

## 2015-01-21 ENCOUNTER — Telehealth (INDEPENDENT_AMBULATORY_CARE_PROVIDER_SITE_OTHER): Payer: Self-pay | Admitting: *Deleted

## 2015-01-21 DIAGNOSIS — K625 Hemorrhage of anus and rectum: Secondary | ICD-10-CM

## 2015-01-21 NOTE — Telephone Encounter (Signed)
Patient came in to see her results of hemoccult cards.  Gave her results  She is anxious to know what next step is,  Also make sure we are sending results to [facility].

## 2015-01-23 NOTE — Telephone Encounter (Signed)
Message left at home. Will repeat a CBC in 1 month.

## 2015-01-24 NOTE — Telephone Encounter (Signed)
Lab order is noted for 02/24/15.

## 2015-01-24 NOTE — Telephone Encounter (Signed)
Lab order has been created for 1 month per Terri's request.

## 2015-01-24 NOTE — Telephone Encounter (Signed)
Message left at home. CBC in one month.

## 2015-01-25 ENCOUNTER — Telehealth (INDEPENDENT_AMBULATORY_CARE_PROVIDER_SITE_OTHER): Payer: Self-pay | Admitting: Internal Medicine

## 2015-01-25 NOTE — Telephone Encounter (Signed)
All of her stool cards were negative. Will repeat a CBC in 4 weeks. She has not seen any further blood. Probable hemorrhoids.

## 2015-01-29 ENCOUNTER — Other Ambulatory Visit (INDEPENDENT_AMBULATORY_CARE_PROVIDER_SITE_OTHER): Payer: Self-pay | Admitting: *Deleted

## 2015-01-29 ENCOUNTER — Encounter (INDEPENDENT_AMBULATORY_CARE_PROVIDER_SITE_OTHER): Payer: Self-pay | Admitting: *Deleted

## 2015-01-29 DIAGNOSIS — K625 Hemorrhage of anus and rectum: Secondary | ICD-10-CM

## 2015-02-26 LAB — CBC
HCT: 44.3 % (ref 36.0–46.0)
Hemoglobin: 14.7 g/dL (ref 12.0–15.0)
MCH: 29.5 pg (ref 26.0–34.0)
MCHC: 33.2 g/dL (ref 30.0–36.0)
MCV: 88.8 fL (ref 78.0–100.0)
MPV: 11.3 fL (ref 8.6–12.4)
Platelets: 266 10*3/uL (ref 150–400)
RBC: 4.99 MIL/uL (ref 3.87–5.11)
RDW: 13.6 % (ref 11.5–15.5)
WBC: 6.2 10*3/uL (ref 4.0–10.5)

## 2015-02-28 ENCOUNTER — Ambulatory Visit (INDEPENDENT_AMBULATORY_CARE_PROVIDER_SITE_OTHER): Payer: Medicare Other | Admitting: Cardiology

## 2015-02-28 ENCOUNTER — Encounter: Payer: Self-pay | Admitting: Cardiology

## 2015-02-28 VITALS — BP 110/82 | HR 80 | Ht 62.0 in | Wt 98.0 lb

## 2015-02-28 DIAGNOSIS — I480 Paroxysmal atrial fibrillation: Secondary | ICD-10-CM

## 2015-02-28 DIAGNOSIS — I10 Essential (primary) hypertension: Secondary | ICD-10-CM

## 2015-02-28 NOTE — Patient Instructions (Signed)
Your physician recommends that you continue on your current medications as directed. Please refer to the Current Medication list given to you today. Your physician recommends that you schedule a follow-up appointment in: 6 months. You will receive a reminder letter in the mail in about 4 months reminding you to call and schedule your appointment. If you don't receive this letter, please contact our office. 

## 2015-02-28 NOTE — Progress Notes (Signed)
Cardiology Office Note  Date: 02/28/2015   ID: Shelly Fields, DOB 09/27/1934, MRN [id number]  PCP: Glenda Chroman., MD  Primary Cardiologist: Rozann Lesches, MD   Chief Complaint  Patient presents with  . PAF    History of Present Illness: Shelly Fields is a 80 y.o. female last seen in May.  She presents for a follow-up visit. Reports feeling much better, no significant palpitations or shortness of breath. She states that her thyroid regimen is being concurrently adjusted by her primary care provider. There has been no change in her cardiac regimen.  She continues on Xarelto. I reviewed her chart, she has had some mild hemorrhoidal bleeding, but no significant anemia by follow-up testing, lab work outlined below. She was seen by gastroenterology.   Past Medical History  Diagnosis Date  . Hypothyroidism   . Shingles   . Internal hemorrhoids   . Seasonal allergies   . Essential hypertension, benign   . Diverticulosis   . Paroxysmal atrial fibrillation     Current Outpatient Prescriptions  Medication Sig Dispense Refill  . acyclovir (ZOVIRAX) 200 MG capsule Take 1 capsule by mouth as needed.    . calcium carbonate (OS-CAL) 600 MG TABS Take 600 mg by mouth 2 (two) times daily with a meal.    . Cholecalciferol (VITAMIN D3) 5000 UNITS CAPS Take 5,000 Units by mouth daily.    Marland Kitchen docusate sodium (COLACE) 100 MG capsule Take 200 mg by mouth at bedtime.     . flecainide (TAMBOCOR) 50 MG tablet Take 1 tablet (50 mg total) by mouth 2 (two) times daily. 180 tablet 3  . furosemide (LASIX) 20 MG tablet Take 1 tablet (20 mg total) by mouth daily as needed. 30 tablet 1  . levothyroxine (SYNTHROID, LEVOTHROID) 50 MCG tablet Take 25 mcg by mouth daily.     . metoprolol succinate (TOPROL-XL) 50 MG 24 hr tablet Take 1 tablet (50 mg total) by mouth daily. 90 tablet 3  . Multiple Vitamin (MULTIVITAMIN) tablet Take 1 tablet by mouth 2 (two) times a week.     . rivaroxaban (XARELTO) 20 MG TABS  tablet Take 1 tablet (20 mg total) by mouth daily. 25 tablet 0   No current facility-administered medications for this visit.    Allergies:  Review of patient's allergies indicates no known allergies.   Social History: The patient  reports that she has never smoked. She has never used smokeless tobacco. She reports that she does not drink alcohol or use illicit drugs.   ROS:  Please see the history of present illness. Otherwise, complete review of systems is positive for none.  All other systems are reviewed and negative.   Physical Exam: VS:  BP 110/82 mmHg  Pulse 80  Ht 5\' 2"  (1.575 m)  Wt 98 lb (44.453 kg)  BMI 17.92 kg/m2, BMI Body mass index is 17.92 kg/(m^2).  Wt Readings from Last 3 Encounters:  02/28/15 98 lb (44.453 kg)  01/08/15 97 lb 14.4 oz (44.407 kg)  10/15/14 100 lb (45.36 kg)     Thin woman in no acute distress.  HEENT: Conjunctiva and lids normal, oropharynx clear.  Neck: Supple, no elevated JVP or carotid bruits, no thyromegaly.  Lungs: Clear to auscultation, nonlabored breathing at rest.  Cardiac: Regular rate and rhythm with occasional ectopy, no S3, soft systolic murmur, no pericardial rub.  Abdomen: Soft, nontender, bowel sounds present, no guarding or rebound.  Extremities: Trace ankle edema, distal pulses 2+.  ECG: ECG is not ordered today.   Recent Labwork: 09/05/2014: Magnesium 2.1; TSH 2.488 10/10/2014: BUN 12; Creat 0.80; Potassium 5.0; Sodium 134* 02/25/2015: Hemoglobin 14.7; Platelets 266   Other Studies Reviewed Today:  Echocardiogram 09/19/2014: Study Conclusions  - Left ventricle: The cavity size was normal. Wall thickness was increased in a pattern of mild LVH. Systolic function was normal. The estimated ejection fraction was in the range of 50% to 55%. Wall motion was normal; there were no regional wall motion abnormalities. - Aortic valve: There was mild regurgitation. Valve area (VTI): 2.41 cm^2. Valve area (Vmax):  1.98 cm^2. Valve area (Vmean): 2.3 cm^2. - Right ventricle: The cavity size was moderately dilated. - Right atrium: The atrium was moderately dilated. - Atrial septum: No defect or patent foramen ovale was identified. - Tricuspid valve: There was moderate regurgitation. The TR VC is 0.5 cm. - Pulmonary arteries: PA peak pressure: 31 mm Hg (S). PASP is borderline elevated. - Inferior vena cava: The vessel was dilated. The respirophasic diameter changes were blunted (< 50%), consistent with elevated central venous pressure. - Technically adequate study.  Assessment and Plan:  1. Paroxysmal atrial fibrillation, symptomatically well controlled on current regimen. No changes were made today.  2. Essential hypertension, blood pressure is normal today.  Current medicines were reviewed with the patient today.  Disposition: FU with me in 6 months.   Signed, Satira Sark, MD, [facility] 02/28/2015 10:06 AM     [place] [facility] [place], [place], [place] [address] Phone: [phone]; Fax: [phone]

## 2015-06-04 ENCOUNTER — Other Ambulatory Visit: Payer: Self-pay | Admitting: Cardiology

## 2015-07-23 ENCOUNTER — Telehealth: Payer: Self-pay | Admitting: *Deleted

## 2015-07-23 MED ORDER — METOPROLOL SUCCINATE ER 50 MG PO TB24: ORAL_TABLET | ORAL | Status: DC

## 2015-07-23 MED ORDER — FLECAINIDE ACETATE 50 MG PO TABS: ORAL_TABLET | ORAL | Status: DC

## 2015-07-23 MED ORDER — RIVAROXABAN 20 MG PO TABS: 20.0000 mg | ORAL_TABLET | Freq: Every day | ORAL | Status: DC

## 2015-07-23 NOTE — Telephone Encounter (Signed)
Requested refills sent to pharmacy

## 2015-09-03 ENCOUNTER — Ambulatory Visit (INDEPENDENT_AMBULATORY_CARE_PROVIDER_SITE_OTHER): Payer: Medicare Other | Admitting: Cardiology

## 2015-09-03 ENCOUNTER — Encounter: Payer: Self-pay | Admitting: Cardiology

## 2015-09-03 VITALS — BP 135/91 | HR 84 | Ht 62.0 in | Wt 101.2 lb

## 2015-09-03 DIAGNOSIS — Z7901 Long term (current) use of anticoagulants: Secondary | ICD-10-CM

## 2015-09-03 DIAGNOSIS — E039 Hypothyroidism, unspecified: Secondary | ICD-10-CM

## 2015-09-03 DIAGNOSIS — I10 Essential (primary) hypertension: Secondary | ICD-10-CM

## 2015-09-03 DIAGNOSIS — I480 Paroxysmal atrial fibrillation: Secondary | ICD-10-CM | POA: Diagnosis not present

## 2015-09-03 MED ORDER — RIVAROXABAN 20 MG PO TABS: 20.0000 mg | ORAL_TABLET | Freq: Every day | ORAL | Status: DC

## 2015-09-03 NOTE — Patient Instructions (Signed)
Your physician recommends that you continue on your current medications as directed. Please refer to the Current Medication list given to you today. Your physician recommends that you have lab work to check your flecainide level, cbc and bmet. Please have this done non-fasting lab work done early morning just before your next dose of flecainide is due. Your physician recommends that you schedule a follow-up appointment in: 6 months. You will receive a reminder letter in the mail in about 4 months reminding you to call and schedule your appointment. If you don't receive this letter, please contact our office.

## 2015-09-03 NOTE — Progress Notes (Signed)
Cardiology Office Note  Date: 09/03/2015   ID: Shelly Fields, DOB 03-07-1935, MRN [id number]  PCP: Glenda Chroman., MD  Primary Cardiologist: Rozann Lesches, MD   Chief Complaint  Patient presents with  . Atrial Fibrillation    History of Present Illness: Shelly Fields is a 81 y.o. female last seen in September 2016. She presents for a routine follow-up visit. Although not reporting any palpitations, she has had episodes where she feels short of breath with activity, usually when she increases her exertion such as walking uphill. She has been exercising also at a local gym. Today's ECG shows that she is in atrial fibrillation, although heart rate is in the 80s at rest on Toprol-XL.  She continues on Xarelto and flecainide. No reported spontaneous bleeding problems. I reviewed her last lab work from PCP back in July 2016.  Blood pressure today is mildly increased. She reports compliance with her medications.   Past Medical History  Diagnosis Date  . Hypothyroidism   . Shingles   . Internal hemorrhoids   . Seasonal allergies   . Essential hypertension, benign   . Diverticulosis   . Paroxysmal atrial fibrillation [facility])     Past Surgical History  Procedure Laterality Date  . Abdominal hysterectomy    . Cataract extraction    . Colonoscopy N/A 01/12/2013    Procedure: COLONOSCOPY;  Surgeon: Rogene Houston, MD;  [facility];  Service: Endoscopy;  Laterality: N/A;  1030    Current Outpatient Prescriptions  Medication Sig Dispense Refill  . acyclovir (ZOVIRAX) 200 MG capsule Take 1 capsule by mouth as needed.    . calcium carbonate (OS-CAL) 600 MG TABS Take 600 mg by mouth 2 (two) times daily with a meal.    . Cholecalciferol (VITAMIN D3) 5000 UNITS CAPS Take 5,000 Units by mouth daily.    Marland Kitchen docusate sodium (COLACE) 100 MG capsule Take 200 mg by mouth at bedtime.     . flecainide (TAMBOCOR) 50 MG tablet Take 1 tablet by mouth two  times daily 180 tablet 3  .  furosemide (LASIX) 20 MG tablet Take 1 tablet (20 mg total) by mouth daily as needed. 30 tablet 1  . levothyroxine (SYNTHROID, LEVOTHROID) 50 MCG tablet Take 25 mcg by mouth daily.     . metoprolol succinate (TOPROL-XL) 50 MG 24 hr tablet Take 1 tablet by mouth  daily 90 tablet 3  . Multiple Vitamin (MULTIVITAMIN) tablet Take 1 tablet by mouth 2 (two) times a week.     . rivaroxaban (XARELTO) 20 MG TABS tablet Take 1 tablet (20 mg total) by mouth daily. 28 tablet 0   No current facility-administered medications for this visit.   Allergies:  Review of patient's allergies indicates no known allergies.   Social History: The patient  reports that she has never smoked. She has never used smokeless tobacco. She reports that she does not drink alcohol or use illicit drugs.   ROS:  Please see the history of present illness. Otherwise, complete review of systems is positive for some trouble with her memory.  All other systems are reviewed and negative.   Physical Exam: VS:  BP 135/91 mmHg  Pulse 84  Ht 5\' 2"  ([id number] m)  Wt 101 lb 3.2 oz (45.904 kg)  BMI 18.51 kg/m2  SpO2 99%, BMI Body mass index is 18.51 kg/(m^2).  Wt Readings from Last 3 Encounters:  09/03/15 101 lb 3.2 oz (45.904 kg)  02/28/15 98 lb (44.453 kg)  01/08/15 97 lb 14.4 oz (44.407 kg)    Thin woman in no acute distress.  HEENT: Conjunctiva and lids normal, oropharynx clear.  Neck: Supple, no elevated JVP or carotid bruits, no thyromegaly.  Lungs: Clear to auscultation, nonlabored breathing at rest.  Cardiac: Regular rate and rhythm with occasional ectopy, no S3, soft systolic murmur, no pericardial rub.  Abdomen: Soft, nontender, bowel sounds present, no guarding or rebound.  Extremities: Trace ankle edema, distal pulses 2+.  Skin: Warm and dry. Musculoskeletal: Mild kyphosis. Neuropsychiatric: Alert and oriented 3, affect appropriate.  ECG: I personally reviewed the prior tracing from 04/11/2014 which showed normal  sinus rhythm with R' in lead V1.  Recent Labwork: 09/05/2014: Magnesium 2.1; TSH 2.488 10/10/2014: BUN 12; Creat 0.80; Potassium 5.0; Sodium 134* 02/25/2015: Hemoglobin 14.7; Platelets 266  July 2016: Hemoglobin 15.0, platelets 287, BUN 12, creatinine 0.8, potassium 4.8, AST 26, ALT 29  Other Studies Reviewed Today:  Echocardiogram 09/19/2014: Study Conclusions  - Left ventricle: The cavity size was normal. Wall thickness was increased in a pattern of mild LVH. Systolic function was normal. The estimated ejection fraction was in the range of 50% to 55%. Wall motion was normal; there were no regional wall motion abnormalities. - Aortic valve: There was mild regurgitation. Valve area (VTI): 2.41 cm^2. Valve area (Vmax): 1.98 cm^2. Valve area (Vmean): 2.3 cm^2. - Right ventricle: The cavity size was moderately dilated. - Right atrium: The atrium was moderately dilated. - Atrial septum: No defect or patent foramen ovale was identified. - Tricuspid valve: There was moderate regurgitation. The TR VC is 0.5 cm. - Pulmonary arteries: PA peak pressure: 31 mm Hg (S). PASP is borderline elevated. - Inferior vena cava: The vessel was dilated. The respirophasic diameter changes were blunted (< 50%), consistent with elevated central venous pressure. - Technically adequate study.  Assessment and Plan:  1. Paroxysmal atrial fibrillation. She is noted to be in atrial fibrillation today, although no palpitations at rest. In light of intermittent dyspnea on exertion and also to evaluate current regimen efficacy, we will obtain a flecainide level and also follow-up with CBC and BMET as she continues on Xarelto. If her flecainide level is low range, we will consider increasing to 100 mg twice daily for better rhythm control. Otherwise she will maintain her current dose of Toprol-XL for now.  2. Essential hypertension, blood pressure is mildly increased today. Recommended that she  continue exercising.  3. Hypothyroidism on Synthroid. She follows with PCP. She states that this has been adjusted over time, her last TSH from March 2016 was in normal range.  Current medicines were reviewed with the patient today.   Orders Placed This Encounter  Procedures  . Basic metabolic panel  . Flecainide level  . CBC  . EKG 12-Lead    Disposition: FU with me in 6 months.   Signed, Satira Sark, MD, [facility] 09/03/2015 10:53 AM    [facility] [place], [place], [place] [address] Phone: [phone]; Fax: [phone]

## 2015-09-04 ENCOUNTER — Other Ambulatory Visit: Payer: Self-pay | Admitting: Cardiology

## 2015-09-04 LAB — CBC
HEMATOCRIT: 43.5 % (ref 36.0–46.0)
HEMOGLOBIN: 14.3 g/dL (ref 12.0–15.0)
MCH: 29.9 pg (ref 26.0–34.0)
MCHC: 32.9 g/dL (ref 30.0–36.0)
MCV: 90.8 fL (ref 78.0–100.0)
MPV: 10.3 fL (ref 8.6–12.4)
Platelets: 258 10*3/uL (ref 150–400)
RBC: 4.79 MIL/uL (ref 3.87–5.11)
RDW: 12.7 % (ref 11.5–15.5)
WBC: 6.6 10*3/uL (ref 4.0–10.5)

## 2015-09-04 LAB — BASIC METABOLIC PANEL
BUN: 16 mg/dL (ref 7–25)
CHLORIDE: 98 mmol/L (ref 98–110)
CO2: 28 mmol/L (ref 20–31)
Calcium: 10.1 mg/dL (ref 8.6–10.4)
Creat: 0.83 mg/dL (ref 0.60–0.93)
GLUCOSE: 60 mg/dL — AB (ref 65–99)
POTASSIUM: 4.9 mmol/L (ref 3.5–5.3)
Sodium: 136 mmol/L (ref 135–146)

## 2015-09-10 LAB — FLECAINIDE LEVEL: Flecainide: 0.36 ug/mL (ref 0.20–1.00)

## 2015-09-11 ENCOUNTER — Telehealth: Payer: Self-pay | Admitting: *Deleted

## 2015-09-11 MED ORDER — FLECAINIDE ACETATE 100 MG PO TABS: 100.0000 mg | ORAL_TABLET | Freq: Two times a day (BID) | ORAL | Status: DC

## 2015-09-11 NOTE — Telephone Encounter (Signed)
Patient informed and verbalized understanding of plan. 

## 2015-09-11 NOTE — Telephone Encounter (Signed)
-----   Message from Satira Sark, MD sent at 09/11/2015  8:19 AM EDT ----- Reviewed. Flecainide level is low normal range. As we discussed at the recent office visit, let's try and increase flecainide to 100 mg twice daily to see if this provides better control of her atrial fibrillation. She will need an ECG in 1 week.

## 2015-09-11 NOTE — Telephone Encounter (Signed)
-----   Message from Satira Sark, MD sent at 09/05/2015  7:19 AM EDT ----- Reviewed CBC and BMET. Normal renal function and potassium. Normal hemoglobin and platelets. Await flecainide level.

## 2015-09-24 ENCOUNTER — Telehealth: Payer: Self-pay | Admitting: Cardiology

## 2015-09-24 MED ORDER — FUROSEMIDE 20 MG PO TABS: 20.0000 mg | ORAL_TABLET | Freq: Every day | ORAL | Status: DC | PRN

## 2015-09-24 MED ORDER — FLECAINIDE ACETATE 50 MG PO TABS: 75.0000 mg | ORAL_TABLET | Freq: Two times a day (BID) | ORAL | Status: DC

## 2015-09-24 NOTE — Telephone Encounter (Signed)
If the pills can be split, we could try to go to one and a half (75 mg) twice a day. Otherwise I am okay with her going back to 50 mg twice a day if she would like.

## 2015-09-24 NOTE — Telephone Encounter (Signed)
Patient informed and agrees to decrease flecainide to 75 mg twice daily. Patient will break her 100 mg tablets in half with half of a 50 mg tablet twice daily until they are finished. Patient aware that new rx sent to [facility].

## 2015-09-24 NOTE — Telephone Encounter (Signed)
Patient c/o being nauseated, fatigue, jittery and edema in feet since increasing dose of flecainide. Patient feels like the 100 mg twice daily of flecainide is too strong. No c/o dizziness, chest pain or sob. Please advise.

## 2015-10-03 ENCOUNTER — Other Ambulatory Visit (HOSPITAL_COMMUNITY): Payer: Self-pay | Admitting: Internal Medicine

## 2015-10-03 DIAGNOSIS — Z1231 Encounter for screening mammogram for malignant neoplasm of breast: Secondary | ICD-10-CM

## 2015-10-09 ENCOUNTER — Ambulatory Visit (HOSPITAL_COMMUNITY)
Admission: RE | Admit: 2015-10-09 | Discharge: 2015-10-09 | Disposition: A | Discharge status: Home / Self Care | Payer: Medicare Other | Source: Ambulatory Visit | Attending: Internal Medicine | Admitting: Internal Medicine

## 2015-10-09 DIAGNOSIS — Z1231 Encounter for screening mammogram for malignant neoplasm of breast: Secondary | ICD-10-CM | POA: Diagnosis not present

## 2016-03-13 ENCOUNTER — Encounter: Payer: Self-pay | Admitting: *Deleted

## 2016-03-13 NOTE — Progress Notes (Signed)
Cardiology Office Note  Date: 03/16/2016   ID: Shelly Fields, DOB 06-23-1934, MRN [id number]  PCP: Glenda Chroman, MD  Primary Cardiologist: Rozann Lesches, MD   Chief Complaint  Patient presents with  . PAF    History of Present Illness: Shelly Fields is a 81 y.o. female last seen in March. She presents for a routine follow-up visit. States that she had a recent 3 day episode of feeling weak with a vague sense of palpitations, but that this has improved. She has known PAF. Since her last visit we increased flecainide to 75 mg twice daily after finding her flecainide level to be low end of normal range with recurrent atrial fibrillation.  I went over her medications today. She continues on flecainide, Toprol-XL, and Xarelto. She had recent lab work obtained per Dr. Woody Seller which I reviewed below. She denies any bleeding problems.  She also had a chest CT done in September per Dr. Woody Seller that demonstrated small scattered parenchymal nodules bilaterally, most likely postinflammatory. A follow-up CT is recommended in a year. She has had some cough, mildly productive at times.  Past Medical History:  Diagnosis Date  . Diverticulosis   . Essential hypertension, benign   . Hypothyroidism   . Internal hemorrhoids   . Paroxysmal atrial fibrillation (HCC)   . Seasonal allergies   . Shingles     Past Surgical History:  Procedure Laterality Date  . ABDOMINAL HYSTERECTOMY    . CATARACT EXTRACTION    . COLONOSCOPY N/A 01/12/2013   Procedure: COLONOSCOPY;  Surgeon: Rogene Houston, MD;  [facility];  Service: Endoscopy;  Laterality: N/A;  1030    Current Outpatient Prescriptions  Medication Sig Dispense Refill  . calcium carbonate (OS-CAL) 600 MG TABS Take 600 mg by mouth 2 (two) times daily with a meal.    . Cholecalciferol (VITAMIN D3) 5000 UNITS CAPS Take 5,000 Units by mouth daily.    Marland Kitchen docusate sodium (COLACE) 100 MG capsule Take 200 mg by mouth at bedtime.     .  flecainide (TAMBOCOR) 50 MG tablet Take 1.5 tablets (75 mg total) by mouth 2 (two) times daily. 270 tablet 3  . furosemide (LASIX) 20 MG tablet Take 1 tablet (20 mg total) by mouth daily as needed. 30 tablet 1  . levothyroxine (SYNTHROID, LEVOTHROID) 50 MCG tablet Take 25 mcg by mouth daily.     . metoprolol succinate (TOPROL-XL) 50 MG 24 hr tablet Take 1 tablet by mouth  daily 90 tablet 3  . Multiple Vitamin (MULTIVITAMIN) tablet Take 1 tablet by mouth 2 (two) times a week.     . rivaroxaban (XARELTO) 20 MG TABS tablet Take 1 tablet (20 mg total) by mouth daily. 28 tablet 0   No current facility-administered medications for this visit.    Allergies:  Review of patient's allergies indicates no known allergies.   Social History: The patient  reports that she has never smoked. She has never used smokeless tobacco. She reports that she does not drink alcohol or use drugs.   ROS:  Please see the history of present illness. Otherwise, complete review of systems is positive for decreased hearing.  All other systems are reviewed and negative.   Physical Exam: VS:  BP 110/90   Pulse 69   Ht 5\' 2"  (1.575 m)   Wt 100 lb (45.4 kg)   SpO2 97%   BMI 18.29 kg/m , BMI Body mass index is 18.29 kg/m.  Wt Readings  from Last 3 Encounters:  03/16/16 100 lb (45.4 kg)  09/03/15 101 lb 3.2 oz (45.9 kg)  02/28/15 98 lb (44.5 kg)    Thin woman in no acute distress.  HEENT: Conjunctiva and lids normal, oropharynx clear.  Neck: Supple, no elevated JVP or carotid bruits, no thyromegaly.  Lungs: Clear to auscultation, nonlabored breathing at rest.  Cardiac: Regular rate and rhythm with ectopy, no S3, soft systolic murmur, no pericardial rub.  Abdomen: Soft, nontender, bowel sounds present, no guarding or rebound.  Extremities: Trace ankle edema, distal pulses 2+.  Skin: Warm and dry. Musculoskeletal: Mild kyphosis. Neuropsychiatric: Alert and oriented 3, affect appropriate.  ECG: I personally  reviewed the tracing from 09/03/2015 which showed rate-controlled atrial fibrillation with R' in lead V1 and V2.  Recent Labwork: 09/04/2015: BUN 16; Creat 0.83; Hemoglobin 14.3; Platelets 258; Potassium 4.9; Sodium 136  March 2017: Flecainide level 0.36 August 2017: TSH 2.0, BUN 10, creatinine 0.8, potassium 5.1, AST 34, ALT26, cholesterol 133, triglycerides 77, HDL 57, LDL 61, Hgb 15.2, platelets 265  Other Studies Reviewed Today:  Echocardiogram 09/19/2014: Study Conclusions  - Left ventricle: The cavity size was normal. Wall thickness was increased in a pattern of mild LVH. Systolic function was normal. The estimated ejection fraction was in the range of 50% to 55%. Wall motion was normal; there were no regional wall motion abnormalities. - Aortic valve: There was mild regurgitation. Valve area (VTI): 2.41 cm^2. Valve area (Vmax): 1.98 cm^2. Valve area (Vmean): 2.3 cm^2. - Right ventricle: The cavity size was moderately dilated. - Right atrium: The atrium was moderately dilated. - Atrial septum: No defect or patent foramen ovale was identified. - Tricuspid valve: There was moderate regurgitation. The TR VC is 0.5 cm. - Pulmonary arteries: PA peak pressure: 31 mm Hg (S). PASP is borderline elevated. - Inferior vena cava: The vessel was dilated. The respirophasic diameter changes were blunted (< 50%), consistent with elevated central venous pressure. - Technically adequate study.  Assessment and Plan:  1. Paroxysmal atrial fibrillation. Continue current strategy including flecainide, Toprol-XL, and Xarelto. I reviewed her recent lab work.  2. Essential hypertension, she continues to check blood pressure at home.  3. Hypothyroidism, on Synthroid. Recent TSH 2.0.  4. Small scattered parenchymal lung nodules, likely postinflammatory. Recent chest CT done per Dr. Woody Seller reviewed above.  Current medicines were reviewed with the patient today.  Disposition:  Follow with me in 6 months.  Signed, Satira Sark, MD, [facility] 03/16/2016 9:34 AM    [place] [facility] [place], [place], [place] [address] Phone: [phone]; Fax: [phone]

## 2016-03-16 ENCOUNTER — Encounter: Payer: Self-pay | Admitting: Cardiology

## 2016-03-16 ENCOUNTER — Ambulatory Visit (INDEPENDENT_AMBULATORY_CARE_PROVIDER_SITE_OTHER): Payer: Medicare Other | Admitting: Cardiology

## 2016-03-16 VITALS — BP 110/90 | HR 69 | Ht 62.0 in | Wt 100.0 lb

## 2016-03-16 DIAGNOSIS — R918 Other nonspecific abnormal finding of lung field: Secondary | ICD-10-CM | POA: Diagnosis not present

## 2016-03-16 DIAGNOSIS — I480 Paroxysmal atrial fibrillation: Secondary | ICD-10-CM

## 2016-03-16 DIAGNOSIS — E039 Hypothyroidism, unspecified: Secondary | ICD-10-CM | POA: Diagnosis not present

## 2016-03-16 DIAGNOSIS — I10 Essential (primary) hypertension: Secondary | ICD-10-CM

## 2016-03-16 NOTE — Patient Instructions (Signed)

## 2016-04-06 ENCOUNTER — Other Ambulatory Visit: Payer: Self-pay | Admitting: Cardiology

## 2016-06-09 ENCOUNTER — Other Ambulatory Visit: Payer: Self-pay | Admitting: Cardiology

## 2016-08-19 ENCOUNTER — Other Ambulatory Visit: Payer: Self-pay | Admitting: Cardiology

## 2016-08-28 ENCOUNTER — Other Ambulatory Visit (HOSPITAL_COMMUNITY): Payer: Self-pay | Admitting: Internal Medicine

## 2016-08-28 DIAGNOSIS — Z1231 Encounter for screening mammogram for malignant neoplasm of breast: Secondary | ICD-10-CM

## 2016-09-15 ENCOUNTER — Ambulatory Visit (INDEPENDENT_AMBULATORY_CARE_PROVIDER_SITE_OTHER): Payer: Medicare Other | Admitting: Cardiology

## 2016-09-15 ENCOUNTER — Encounter: Payer: Self-pay | Admitting: Cardiology

## 2016-09-15 VITALS — BP 160/80 | HR 79 | Ht 62.0 in | Wt 97.8 lb

## 2016-09-15 DIAGNOSIS — J301 Allergic rhinitis due to pollen: Secondary | ICD-10-CM | POA: Diagnosis not present

## 2016-09-15 DIAGNOSIS — I481 Persistent atrial fibrillation: Secondary | ICD-10-CM

## 2016-09-15 DIAGNOSIS — I10 Essential (primary) hypertension: Secondary | ICD-10-CM | POA: Diagnosis not present

## 2016-09-15 DIAGNOSIS — I4819 Other persistent atrial fibrillation: Secondary | ICD-10-CM

## 2016-09-15 DIAGNOSIS — E039 Hypothyroidism, unspecified: Secondary | ICD-10-CM | POA: Diagnosis not present

## 2016-09-15 MED ORDER — METOPROLOL SUCCINATE ER 50 MG PO TB24: ORAL_TABLET | ORAL | Status: DC

## 2016-09-15 MED ORDER — RIVAROXABAN 20 MG PO TABS: 20.0000 mg | ORAL_TABLET | Freq: Every day | ORAL | 0 refills | Status: DC

## 2016-09-15 NOTE — Progress Notes (Signed)
Cardiology Office Note  Date: 09/15/2016   ID: Shelly Fields, DOB 31-Oct-1934, MRN [id number]  PCP: Glenda Chroman, MD  Primary Cardiologist: Rozann Lesches, MD   Chief Complaint  Patient presents with  . Atrial Fibrillation    History of Present Illness: Shelly Fields is a 82 y.o. female last seen in October 2017. She presents for a routine follow-up visit. Reports some sinus congestion and sneezing during the pollen season. She has been taking over-the-counter Claritin. No specific complaint of palpitations or chest pain. She reports compliance with her medications.  I personally reviewed her ECG today which shows rate-controlled atrial fibrillation with R' in lead V1 and V2 and rightward axis. We discussed her rhythm management. It looks like she has had persistent atrial fibrillation and seems to be tolerating this fairly well. Plan will be to adopt a strategy of heart rate control and anticoagulation, stop flecainide for now.  Past Medical History:  Diagnosis Date  . Diverticulosis   . Essential hypertension, benign   . Hypothyroidism   . Internal hemorrhoids   . Paroxysmal atrial fibrillation (HCC)   . Seasonal allergies   . Shingles     Past Surgical History:  Procedure Laterality Date  . ABDOMINAL HYSTERECTOMY    . CATARACT EXTRACTION    . COLONOSCOPY N/A 01/12/2013   Procedure: COLONOSCOPY;  Surgeon: Rogene Houston, MD;  [facility];  Service: Endoscopy;  Laterality: N/A;  1030    Current Outpatient Prescriptions  Medication Sig Dispense Refill  . calcium carbonate (OS-CAL) 600 MG TABS Take 600 mg by mouth 2 (two) times daily with a meal.    . Cholecalciferol (VITAMIN D3) 5000 UNITS CAPS Take 5,000 Units by mouth daily.    Marland Kitchen docusate sodium (COLACE) 100 MG capsule Take 200 mg by mouth at bedtime.     . furosemide (LASIX) 20 MG tablet Take 1 tablet (20 mg total) by mouth daily as needed. 30 tablet 1  . levothyroxine (SYNTHROID, LEVOTHROID) 50 MCG  tablet Take 25 mcg by mouth daily.     . metoprolol succinate (TOPROL-XL) 50 MG 24 hr tablet TAKE 1 TABLET BY MOUTH  DAILY 90 tablet 1  . Multiple Vitamin (MULTIVITAMIN) tablet Take 1 tablet by mouth 2 (two) times a week.     . rivaroxaban (XARELTO) 20 MG TABS tablet Take 1 tablet (20 mg total) by mouth daily. 28 tablet 0  . XARELTO 20 MG TABS tablet TAKE 1 TABLET BY MOUTH  DAILY 90 tablet 3   No current facility-administered medications for this visit.    Allergies:  Patient has no known allergies.   Social History: The patient  reports that she has never smoked. She has never used smokeless tobacco. She reports that she does not drink alcohol or use drugs.   ROS:  Please see the history of present illness. Otherwise, complete review of systems is positive for hearing loss.  All other systems are reviewed and negative.   Physical Exam: VS:  BP (!) 160/80 (BP Location: Left Arm, Patient Position: Sitting, Cuff Size: Normal)   Pulse 79   Ht 5\' 2"  (1.575 m)   Wt 97 lb 12.8 oz (44.4 kg)   SpO2 95%   BMI 17.89 kg/m , BMI Body mass index is 17.89 kg/m.  Wt Readings from Last 3 Encounters:  09/15/16 97 lb 12.8 oz (44.4 kg)  03/16/16 100 lb (45.4 kg)  09/03/15 101 lb 3.2 oz (45.9 kg)  Thin woman in no acute distress.  HEENT: Conjunctiva and lids normal, oropharynx clear.  Neck: Supple, no elevated JVP or carotid bruits, no thyromegaly.  Lungs: Clear to auscultation, nonlabored breathing at rest.  Cardiac: Irregularly irregular, no S3, soft systolic murmur, no pericardial rub.  Abdomen: Soft, nontender, bowel sounds present, no guarding or rebound.  Extremities: Trace ankle edema, distal pulses 2+.  Skin: Warm and dry. Musculoskeletal: Mild kyphosis. Neuropsychiatric: Alert and oriented 3, affect appropriate.  ECG: I personally reviewed the tracing from 09/03/2015 which showed rate-controlled atrial fibrillation with R' in lead V1 and V2.  Recent Labwork:  March 2017:  Flecainide level 0.36 August 2017: TSH 2.0, BUN 10, creatinine 0.8, potassium 5.1, AST 34, ALT 26, cholesterol 133, triglycerides 77, HDL 57, LDL 61, Hgb 15.2, platelets 265  Other Studies Reviewed Today:  Echocardiogram 09/19/2014: Study Conclusions  - Left ventricle: The cavity size was normal. Wall thickness was increased in a pattern of mild LVH. Systolic function was normal. The estimated ejection fraction was in the range of 50% to 55%. Wall motion was normal; there were no regional wall motion abnormalities. - Aortic valve: There was mild regurgitation. Valve area (VTI): 2.41 cm^2. Valve area (Vmax): 1.98 cm^2. Valve area (Vmean): 2.3 cm^2. - Right ventricle: The cavity size was moderately dilated. - Right atrium: The atrium was moderately dilated. - Atrial septum: No defect or patent foramen ovale was identified. - Tricuspid valve: There was moderate regurgitation. The TR VC is 0.5 cm. - Pulmonary arteries: PA peak pressure: 31 mm Hg (S). PASP is borderline elevated. - Inferior vena cava: The vessel was dilated. The respirophasic diameter changes were blunted (<50%), consistent with elevated central venous pressure. - Technically adequate study.  Assessment and Plan:  1. Persistent atrial fibrillation. Flecainide has not been particularly effective despite higher dose, and she seems to be tolerating atrial fibrillation relatively well. Plan is to stop flecainide for now, increase Toprol-XL to 75 mg daily, and continue stroke prophylaxis with Xarelto.  2. Essential hypertension, blood pressure elevated today. Toprol-XL dose is being increased concurrently for management of atrial fibrillation. Keep follow-up with Dr. Woody Seller.  3. Hypothyroidism, continues on Synthroid with follow-up per Dr. Woody Seller.  4. Sinus congestion and sneezing. She is tolerating Claritin over-the-counter.  Current medicines were reviewed with the patient today.   Orders Placed This  Encounter  Procedures  . EKG 12-Lead    Disposition: Follow-up in 6 months, sooner if needed.  Signed, Satira Sark, MD, [facility] 09/15/2016 2:07 PM    [place] [facility] [place], [place], [place] [address] Phone: [phone]; Fax: [phone]

## 2016-09-15 NOTE — Addendum Note (Signed)
Addended by: Laurine Blazer on: 09/15/2016 02:22 PM   Modules accepted: Orders

## 2016-09-15 NOTE — Patient Instructions (Signed)
Medication Instructions:   Stop Flecainide.  Increase Toprol XL to 75mg  daily - she will have [facility] contact us when she is ready for refill.    Continue all other medications.    Labwork: none  Testing/Procedures: none  Follow-Up: Your physician wants you to follow up in: 6 months.  You will receive a reminder letter in the mail one-two months in advance.  If you don't receive a letter, please call our office to schedule the follow up appointment  Any Other Special Instructions Will Be Listed Below (If Applicable).  If you need a refill on your cardiac medications before your next appointment, please call your pharmacy.

## 2016-10-05 ENCOUNTER — Other Ambulatory Visit: Payer: Medicare Other

## 2016-10-12 ENCOUNTER — Ambulatory Visit (HOSPITAL_COMMUNITY)
Admission: RE | Admit: 2016-10-12 | Discharge: 2016-10-12 | Disposition: A | Discharge status: Home / Self Care | Payer: Medicare Other | Source: Ambulatory Visit | Attending: Internal Medicine | Admitting: Internal Medicine

## 2016-10-12 DIAGNOSIS — Z1231 Encounter for screening mammogram for malignant neoplasm of breast: Secondary | ICD-10-CM | POA: Insufficient documentation

## 2016-11-10 ENCOUNTER — Other Ambulatory Visit: Payer: Self-pay | Admitting: Cardiology

## 2016-11-30 ENCOUNTER — Other Ambulatory Visit: Payer: Self-pay | Admitting: Cardiology

## 2016-12-21 ENCOUNTER — Other Ambulatory Visit: Payer: Self-pay | Admitting: Cardiology

## 2016-12-21 MED ORDER — METOPROLOL SUCCINATE ER 50 MG PO TB24: 50.0000 mg | ORAL_TABLET | Freq: Every day | ORAL | 1 refills | Status: DC

## 2016-12-21 NOTE — Telephone Encounter (Signed)
Patient called stating that [facility] has tried to have her TOPROL-XL) 50 MG 24 hr  Filled. States that they have not heard from our office.   Please call [phone].

## 2016-12-21 NOTE — Telephone Encounter (Signed)
Medication sent to pharmacy  

## 2016-12-22 ENCOUNTER — Other Ambulatory Visit: Payer: Self-pay | Admitting: *Deleted

## 2016-12-22 MED ORDER — METOPROLOL SUCCINATE ER 25 MG PO TB24: 25.0000 mg | ORAL_TABLET | Freq: Every day | ORAL | 1 refills | Status: DC

## 2017-02-19 ENCOUNTER — Institutional Professional Consult (permissible substitution): Payer: Medicare Other | Admitting: Internal Medicine

## 2017-03-03 ENCOUNTER — Other Ambulatory Visit: Payer: Self-pay | Admitting: Cardiology

## 2017-03-18 ENCOUNTER — Encounter: Payer: Self-pay | Admitting: Cardiology

## 2017-03-18 ENCOUNTER — Ambulatory Visit (INDEPENDENT_AMBULATORY_CARE_PROVIDER_SITE_OTHER): Payer: Medicare Other | Admitting: Cardiology

## 2017-03-18 VITALS — BP 108/60 | HR 86 | Ht 62.0 in | Wt 101.0 lb

## 2017-03-18 DIAGNOSIS — I361 Nonrheumatic tricuspid (valve) insufficiency: Secondary | ICD-10-CM

## 2017-03-18 DIAGNOSIS — I10 Essential (primary) hypertension: Secondary | ICD-10-CM

## 2017-03-18 DIAGNOSIS — E039 Hypothyroidism, unspecified: Secondary | ICD-10-CM | POA: Diagnosis not present

## 2017-03-18 DIAGNOSIS — I4819 Other persistent atrial fibrillation: Secondary | ICD-10-CM

## 2017-03-18 DIAGNOSIS — I481 Persistent atrial fibrillation: Secondary | ICD-10-CM | POA: Diagnosis not present

## 2017-03-18 MED ORDER — RIVAROXABAN 20 MG PO TABS: 20.0000 mg | ORAL_TABLET | Freq: Every day | ORAL | 0 refills | Status: DC

## 2017-03-18 NOTE — Progress Notes (Signed)
Cardiology Office Note  Date: 03/18/2017   ID: Shelly Fields, DOB Aug 26, 1934, MRN [id number]  PCP: Glenda Chroman, MD  Primary Cardiologist: Shelly Lesches, MD   Chief Complaint  Patient presents with  . Atrial Fibrillation    History of Present Illness: Shelly Fields is a 82 y.o. female last seen in April. She presents for a routine follow-up visit. States that overall she feels fairly well. She does not report any palpitations or chest pain. Has a chronic cough, occasionally productive. I do not have complete information, but it sounds like she underwent a chest CT earlier this summer, was found to have chronic changes suggestive of atypical infection and  has been referred to Pulmonary for evaluation.  At the last visit we discontinued flecainide with plan to continue heart rate control and anticoagulation for persistent atrial fibrillation. He has tolerated this well. She does not report any bleeding problems on Xarelto. Reviewed her interval lab work which is outlined below. Hemoglobin and renal function normal.  She had a follow-up echocardiogram done in August at [facility] as summarized below. That particular study indicated severe tricuspid regurgitation (previously had been moderate) with RVSP 30-40 mmHg.  Past Medical History:  Diagnosis Date  . Diverticulosis   . Essential hypertension, benign   . Hypothyroidism   . Internal hemorrhoids   . Paroxysmal atrial fibrillation (HCC)   . Seasonal allergies   . Shingles     Past Surgical History:  Procedure Laterality Date  . ABDOMINAL HYSTERECTOMY    . CATARACT EXTRACTION    . COLONOSCOPY N/A 01/12/2013   Procedure: COLONOSCOPY;  Surgeon: Shelly Houston, MD;  [facility];  Service: Endoscopy;  Laterality: N/A;  1030    Current Outpatient Prescriptions  Medication Sig Dispense Refill  . calcium carbonate (OS-CAL) 600 MG TABS Take 600 mg by mouth 2 (two) times daily with a meal.    .  Cholecalciferol (VITAMIN D3) 5000 UNITS CAPS Take 5,000 Units by mouth daily.    Marland Kitchen docusate sodium (COLACE) 100 MG capsule Take 200 mg by mouth at bedtime.     . furosemide (LASIX) 20 MG tablet Take 1 tablet (20 mg total) by mouth daily as needed. 30 tablet 1  . levothyroxine (SYNTHROID, LEVOTHROID) 75 MCG tablet Take 75 mcg by mouth daily before breakfast.    . metoprolol succinate (TOPROL-XL) 25 MG 24 hr tablet TAKE 1 TABLET BY MOUTH AT  BEDTIME 90 tablet 3  . metoprolol succinate (TOPROL-XL) 50 MG 24 hr tablet Take 1 tablet (50 mg total) by mouth daily. Take with or immediately following a meal. 90 tablet 1  . Multiple Vitamin (MULTIVITAMIN) tablet Take 1 tablet by mouth 2 (two) times a week.     . rivaroxaban (XARELTO) 20 MG TABS tablet Take 1 tablet (20 mg total) by mouth daily. 28 tablet 0   No current facility-administered medications for this visit.    Allergies:  Patient has no known allergies.   Social History: The patient  reports that she has never smoked. She has never used smokeless tobacco. She reports that she does not drink alcohol or use drugs.   ROS:  Please see the history of present illness. Otherwise, complete review of systems is positive for hearing loss, uses hearing aids.  All other systems are reviewed and negative.   Physical Exam: VS:  BP 108/60   Pulse 86   Ht 5\' 2"  (1.575 m)   Wt 101 lb (  45.8 kg)   SpO2 96%   BMI 18.47 kg/m , BMI Body mass index is 18.47 kg/m.  Wt Readings from Last 3 Encounters:  03/18/17 101 lb (45.8 kg)  09/15/16 97 lb 12.8 oz (44.4 kg)  03/16/16 100 lb (45.4 kg)    General: Thin elderly woman, appears comfortable at rest. HEENT: Conjunctiva and lids normal, oropharynx clear. Neck: Supple, no elevated JVP or carotid bruits, no thyromegaly. Lungs: No wheezing or rhonchi, nonlabored breathing at rest. Cardiac: Irregularly irregular, no S3, soft apical systolic murmur, no pericardial rub. Abdomen: Soft, nontender, bowel sounds  present, no guarding or rebound. Extremities: Trace ankle edema, distal pulses 2+. Skin: Warm and dry. Musculoskeletal: Mild kyphosis. Neuropsychiatric: Alert and oriented x3, affect grossly appropriate.  ECG: I personally reviewed the tracing from 09/15/2016 which showed rate-controlled atrial fibrillation.  Recent Labwork:  August 2017: TSH 2.0, BUN 10, creatinine 0.8, potassium 5.1, AST 34, ALT 26, cholesterol 133, triglycerides 77, HDL 57, LDL 61, Hgb 15.2, platelets 265 July 2018: Hgb 13.6, platelets 249, BUN 14, creatinine 0.9, potassium 4.7, AST 29, ALT 26, cholesterol 137, triglycerides 64, HDL 60, LDL 64, TSH 5.23  Other Studies Reviewed Today:  Echocardiogram 02/01/2017 [facility]): Normal LV wall thickness with LVEF 65-70%, normal left atrial chamber size, moderate right atrial enlargement, mildly calcified aortic valve with mild aortic regurgitation, mild mitral annular calcification with mild mitral regurgitation, severe tricuspid regurgitation with RVSP 30-40 mmHg.  Assessment and Plan:  1. Persistent atrial fibrillation. CHADSVASC score is 5. She is relatively asymptomatic with strategy of heart rate control and anticoagulation. Continue current doses of Toprol XL and Xarelto.  2. Reported severe tricuspid regurgitation with moderately increased RVSP based on echocardiogram done at [facility] in August. She previously had moderate tricuspid regurgitation. Question whether this could be related to suspected chronic lung disease/infection as mentioned above, follow-up with Pulmonary pending. Will plan a follow-up echocardiogram in 6 months.  3. Essential hypertension, blood pressure is normal today.  4. Hypothyroidism, recent TSH 5.23. She continues on Synthroid with follow-up per PCP.  Current medicines were reviewed with the patient today.   Orders Placed This Encounter  Procedures  . ECHOCARDIOGRAM COMPLETE    Disposition: Follow-up in 6  months.  Signed, Shelly Sark, MD, [facility] 03/18/2017 1:36 PM    [place] [facility] [place], [place], [place] [address] Phone: [phone]; Fax: [phone]

## 2017-03-18 NOTE — Patient Instructions (Signed)
Medication Instructions:  Your physician recommends that you continue on your current medications as directed. Please refer to the Current Medication list given to you today.  Labwork: NONE  Testing/Procedures: Your physician has requested that you have an echocardiogram IN 6 MONTHS. Echocardiography is a painless test that uses sound waves to create images of your heart. It provides your doctor with information about the size and shape of your heart and how well your heart's chambers and valves are working. This procedure takes approximately one hour. There are no restrictions for this procedure.  Follow-Up: Your physician wants you to follow-up in: 6 MONTHS WITH DR. MCDOWELL. You will receive a reminder letter in the mail two months in advance. If you don't receive a letter, please call our office to schedule the follow-up appointment.  Any Other Special Instructions Will Be Listed Below (If Applicable).  If you need a refill on your cardiac medications before your next appointment, please call your pharmacy. 

## 2017-03-22 ENCOUNTER — Other Ambulatory Visit (INDEPENDENT_AMBULATORY_CARE_PROVIDER_SITE_OTHER): Payer: Medicare Other

## 2017-03-22 ENCOUNTER — Ambulatory Visit (INDEPENDENT_AMBULATORY_CARE_PROVIDER_SITE_OTHER): Payer: Medicare Other | Admitting: Internal Medicine

## 2017-03-22 ENCOUNTER — Encounter: Payer: Self-pay | Admitting: Internal Medicine

## 2017-03-22 ENCOUNTER — Ambulatory Visit (INDEPENDENT_AMBULATORY_CARE_PROVIDER_SITE_OTHER)
Admission: RE | Admit: 2017-03-22 | Discharge: 2017-03-22 | Disposition: A | Discharge status: Home / Self Care | Payer: Medicare Other | Source: Ambulatory Visit | Attending: Internal Medicine | Admitting: Internal Medicine

## 2017-03-22 VITALS — BP 124/80 | HR 132 | Ht 62.0 in | Wt 101.0 lb

## 2017-03-22 DIAGNOSIS — I480 Paroxysmal atrial fibrillation: Secondary | ICD-10-CM | POA: Diagnosis not present

## 2017-03-22 DIAGNOSIS — I272 Pulmonary hypertension, unspecified: Secondary | ICD-10-CM

## 2017-03-22 DIAGNOSIS — R0609 Other forms of dyspnea: Secondary | ICD-10-CM

## 2017-03-22 DIAGNOSIS — R9389 Abnormal findings on diagnostic imaging of other specified body structures: Secondary | ICD-10-CM

## 2017-03-22 DIAGNOSIS — E039 Hypothyroidism, unspecified: Secondary | ICD-10-CM

## 2017-03-22 DIAGNOSIS — R05 Cough: Secondary | ICD-10-CM

## 2017-03-22 DIAGNOSIS — R058 Other specified cough: Secondary | ICD-10-CM | POA: Insufficient documentation

## 2017-03-22 LAB — SEDIMENTATION RATE: SED RATE: 34 mm/h — AB (ref 0–30)

## 2017-03-22 LAB — CBC WITH DIFFERENTIAL/PLATELET
BASOS ABS: 0.1 10*3/uL (ref 0.0–0.1)
Basophils Relative: 1 % (ref 0.0–3.0)
EOS PCT: 3.4 % (ref 0.0–5.0)
Eosinophils Absolute: 0.3 10*3/uL (ref 0.0–0.7)
HCT: 41.4 % (ref 36.0–46.0)
HEMOGLOBIN: 13.8 g/dL (ref 12.0–15.0)
LYMPHS ABS: 1.7 10*3/uL (ref 0.7–4.0)
Lymphocytes Relative: 21.5 % (ref 12.0–46.0)
MCHC: 33.3 g/dL (ref 30.0–36.0)
MCV: 90.6 fl (ref 78.0–100.0)
MONOS PCT: 10.9 % (ref 3.0–12.0)
Monocytes Absolute: 0.9 10*3/uL (ref 0.1–1.0)
NEUTROS PCT: 63.2 % (ref 43.0–77.0)
Neutro Abs: 5 10*3/uL (ref 1.4–7.7)
Platelets: 280 10*3/uL (ref 150.0–400.0)
RBC: 4.57 Mil/uL (ref 3.87–5.11)
RDW: 12.6 % (ref 11.5–15.5)
WBC: 7.8 10*3/uL (ref 4.0–10.5)

## 2017-03-22 LAB — BRAIN NATRIURETIC PEPTIDE: PRO B NATRI PEPTIDE: 232 pg/mL — AB (ref 0.0–100.0)

## 2017-03-22 LAB — BASIC METABOLIC PANEL
BUN: 13 mg/dL (ref 6–23)
CHLORIDE: 95 meq/L — AB (ref 96–112)
CO2: 28 meq/L (ref 19–32)
Calcium: 9.9 mg/dL (ref 8.4–10.5)
Creatinine, Ser: 0.73 mg/dL (ref 0.40–1.20)
GFR: 81.57 mL/min (ref >60.00)
Glucose, Bld: 103 mg/dL — ABNORMAL HIGH (ref 70–99)
POTASSIUM: 4.5 meq/L (ref 3.5–5.1)
SODIUM: 131 meq/L — AB (ref 135–145)

## 2017-03-22 LAB — TSH: TSH: 0.24 u[IU]/mL — ABNORMAL LOW (ref 0.35–4.50)

## 2017-03-22 MED ORDER — FAMOTIDINE 20 MG PO TABS: ORAL_TABLET | ORAL | 2 refills | Status: DC

## 2017-03-22 MED ORDER — PANTOPRAZOLE SODIUM 40 MG PO TBEC: 40.0000 mg | DELAYED_RELEASE_TABLET | Freq: Every day | ORAL | 2 refills | Status: DC

## 2017-03-22 NOTE — Patient Instructions (Addendum)
Pantoprazole (protonix) 40 mg   Take  30-60 min before first meal of the day and Pepcid (famotidine)  20 mg one @  bedtime until return to office - this is the best way to tell whether stomach acid is contributing to your problem.    GERD (REFLUX)  is an extremely common cause of respiratory symptoms just like yours , many times with no obvious heartburn at all.    It can be treated with medication, but also with lifestyle changes including elevation of the head of your bed (ideally with 6 inch  bed blocks),  Smoking cessation, avoidance of late meals, excessive alcohol, and avoid fatty foods, chocolate, peppermint, colas, red wine, and acidic juices such as orange juice.  NO MINT OR MENTHOL PRODUCTS SO NO COUGH DROPS   USE SUGARLESS CANDY INSTEAD (Jolley ranchers or Stover's or Life Savers) or even ice chips will also do - the key is to swallow to prevent all throat clearing. NO OIL BASED VITAMINS - use powdered substitutes and change the calcium carbonate to calcium gluconate    Please remember to go to the lab and x-ray department downstairs in the basement  for your tests - we will call you with the results when they are available.       Please schedule a follow up office visit in 4 weeks, sooner if needed with pfts on return

## 2017-03-22 NOTE — Progress Notes (Signed)
LMTCB for Shelly Fields Called pt's cell and NA no option to leave msg

## 2017-03-22 NOTE — Progress Notes (Signed)
Subjective:     Patient ID: Shelly Fields, female   DOB: [birth year],    MRN: [id number]  HPI  63 yowf never smoked started with  Chronic cough  Developed 12/2015 mostly with bending over with mostly yellowish with steaks of blood while xarelto but for the most part no blood and w/u ? MAI in [place] so referred to pulmonary clinic 03/22/2017 by Nicanor Bake    03/22/2017 1st  Pulmonary office visit/ Jakobee Brackins   Chief Complaint  Patient presents with  . Pulmonary Consult    Referred by Dr. Woody Seller for eval of [facility]. Pt c/o occ dry cough.   most days x 15 months only "spits up" mucus (clears throat "I don't cough" )  in afternoons < tbsp yellowish thick mucus  Not limited by breathing from desired activities   Assoc with voice fatigue No noct or early am symptoms  No epistaxis or obvious nasal discharge   No obvious day to day or daytime variability or assoc excess/ purulent sputum or mucus plugs or hemoptysis or cp or chest tightness, subjective wheeze or overt sinus or hb symptoms. No unusual exp hx or h/o childhood pna/ asthma or knowledge of premature birth.  Sleeping ok flat without nocturnal  or early am exacerbation  of respiratory  c/o's or need for noct saba. Also denies any obvious fluctuation of symptoms with weather or environmental changes or other aggravating or alleviating factors except as outlined above   Current Allergies, Complete Past Medical History, Past Surgical History, Family History, and Social History were reviewed in [employer] record.  ROS  The following are not active complaints unless bolded Hoarseness, sore throat, dysphagia, dental problems, itching, sneezing,  nasal congestion or discharge of excess mucus or purulent secretions, ear ache,   fever, chills, sweats, unintended wt loss or wt gain, classically pleuritic or exertional cp,  orthopnea pnd or leg swelling, presyncope, palpitations, abdominal pain, anorexia, nausea, vomiting,  diarrhea  or change in bowel habits or change in bladder habits, change in stools or change in urine, dysuria, hematuria,  rash, arthralgias, visual complaints, headache, numbness, weakness or ataxia or problems with walking or coordination,  change in mood/affect or memory.        Current Meds  Medication Sig  . Cholecalciferol (VITAMIN D3) 5000 UNITS CAPS Take 5,000 Units by mouth daily.  Marland Kitchen docusate sodium (COLACE) 100 MG capsule Take 200 mg by mouth at bedtime.   . furosemide (LASIX) 20 MG tablet Take 1 tablet (20 mg total) by mouth daily as needed.  Marland Kitchen levothyroxine (SYNTHROID, LEVOTHROID) 75 MCG tablet Take 75 mcg by mouth daily before breakfast.  . metoprolol succinate (TOPROL-XL) 25 MG 24 hr tablet TAKE 1 TABLET BY MOUTH AT  BEDTIME  . metoprolol succinate (TOPROL-XL) 50 MG 24 hr tablet Take 1 tablet (50 mg total) by mouth daily. Take with or immediately following a meal.  . Multiple Vitamin (MULTIVITAMIN) tablet Take 1 tablet by mouth 2 (two) times a week.   . rivaroxaban (XARELTO) 20 MG TABS tablet Take 1 tablet (20 mg total) by mouth daily.  . [DISCONTINUED] calcium carbonate (OS-CAL) 600 MG TABS Take 600 mg by mouth 2 (two) times daily with a meal.          Review of Systems     Objective:   Physical Exam    thin amb wf nad  Wt Readings from Last 3 Encounters:  03/22/17 101 lb (45.8 kg)  03/18/17 101 lb (45.8  kg)  09/15/16 97 lb 12.8 oz (44.4 kg)    Vital signs reviewed   - Note on arrival 02 sats  100% on RA and pulse 132 with afib on ekg      HEENT: nl dentition, turbinates bilaterally, and oropharynx. Nl external ear canals without cough reflex   NECK :  without JVD/Nodes/TM/ nl carotid upstrokes bilaterally   LUNGS: no acc muscle use,  Nl contour chest which is clear to A and P bilaterally without cough on insp or exp maneuvers   CV:  IRIR  @ 130 settled to 96 p sitting 10 min   no s3 or murmur or increase in P2, and no edema   ABD:  soft and nontender  with nl inspiratory excursion in the supine position. No bruits or organomegaly appreciated, bowel sounds nl  MS:  Nl gait/ ext warm without deformities, calf tenderness, cyanosis or clubbing No obvious joint restrictions   SKIN: warm and dry without lesions    NEURO:  alert, approp, nl sensorium with  no motor or cerebellar deficits apparent.    I personally reviewed images and agree with radiology impression as follows:  Chest CT  01/06/17  C/w MAI with "bronciolectasis" L > R bases / no real change since  02/11/16  but new findings c/w Lebo    CXR PA and Lateral:   03/22/2017 :    I personally reviewed images and agree with radiology impression as follows:    cardiomegaly/ mild non specific increased markings   Labs ordered/ reviewed:   EKG 03/22/2017   Afib at 96 s def ischemic changes     Chemistry      Component Value Date/Time   NA 131 (L) 03/22/2017 1622   K 4.5 03/22/2017 1622   CL 95 (L) 03/22/2017 1622   CO2 28 03/22/2017 1622   BUN 13 03/22/2017 1622   CREATININE 0.73 03/22/2017 1622   CREATININE 0.83 09/04/2015 0903      Component Value Date/Time   CALCIUM 9.9 03/22/2017 1622        Lab Results  Component Value Date   WBC 7.8 03/22/2017   HGB 13.8 03/22/2017   HCT 41.4 03/22/2017   MCV 90.6 03/22/2017   PLT 280.0 03/22/2017         Lab Results  Component Value Date   TSH 0.24 (L) 03/22/2017     Lab Results  Component Value Date   PROBNP 232.0 (H) 03/22/2017       Lab Results  Component Value Date   ESRSEDRATE 34 (H) 03/22/2017          Assessment:

## 2017-03-23 ENCOUNTER — Telehealth: Payer: Self-pay | Admitting: Internal Medicine

## 2017-03-23 DIAGNOSIS — J479 Bronchiectasis, uncomplicated: Secondary | ICD-10-CM | POA: Insufficient documentation

## 2017-03-23 DIAGNOSIS — E039 Hypothyroidism, unspecified: Secondary | ICD-10-CM | POA: Insufficient documentation

## 2017-03-23 DIAGNOSIS — I272 Pulmonary hypertension, unspecified: Secondary | ICD-10-CM | POA: Insufficient documentation

## 2017-03-23 NOTE — Progress Notes (Signed)
Spoke with pt and notified of results per Dr. Wert. Pt verbalized understanding and denied any questions. 

## 2017-03-23 NOTE — Assessment & Plan Note (Signed)
Probably more related to ht dz (right vs left needs to be worked out) and hyperthryroidism than lung dz at this point

## 2017-03-23 NOTE — Telephone Encounter (Signed)
Called Joe and advised results. He understood and I will fax over lab results to her doctor. Nothing further is needed. I called Dr. Woody Seller office and their fax machine is down at the moment so I will mail to his office.

## 2017-03-23 NOTE — Assessment & Plan Note (Signed)
TSH 0.23 on 75 mcg per day and RAF on arrival to office 03/22/2017 > rec hold for a week and recheck tsh then

## 2017-03-23 NOTE — Assessment & Plan Note (Signed)
Allergy profile 03/22/2017 >  Eos 0.3 /  IgE    Cough is not typical of bronchiectasis in that worse in afternoon than hs or in am and more throat clearing than deep cough based on my observation which is typical of Upper airway cough syndrome (previously labeled PNDS),  is so named because it's frequently impossible to sort out how much is  CR/sinusitis with freq throat clearing (which can be related to primary GERD)   vs  causing  secondary (" extra esophageal")  GERD from wide swings in gastric pressure that occur with throat clearing, often  promoting self use of mint and menthol lozenges that reduce the lower esophageal sphincter tone and exacerbate the problem further in a cyclical fashion.   These are the same pts (now being labeled as having "irritable larynx syndrome" by some cough centers) who not infrequently have a history of having failed to tolerate ace inhibitors,  dry powder inhalers or biphosphonates or report having atypical/extraesophageal reflux symptoms that don't respond to standard doses of PPI  and are easily confused as having aecopd or asthma flares by even experienced allergists/ pulmonologists (myself included).   rec trial of gerd/ avoid throat clearing and consider sinus ct to complete the w/u when returns.

## 2017-03-23 NOTE — Assessment & Plan Note (Signed)
Echocardiogram 02/01/2017 [facility]): Normal LV wall thickness with LVEF 65-70%, normal left atrial chamber size, moderate right atrial enlargement, mildly calcified aortic valve with mild aortic regurgitation, mild mitral annular calcification with mild mitral regurgitation, severe tricuspid regurgitation with RVSP 30-40 mmHg - PFT's rec 03/22/2017   She is already on xarelto though CTEPAH is still a possibility but does not appear to have enough lung dz for cor pulmole - will start with PFTs and ono RA to sort out pulmonary component and would consider LHC/RHC later if def trend toward worse PH s explanation

## 2017-03-23 NOTE — Assessment & Plan Note (Signed)
This is an extremely common benign condition in the elderly and does not warrant aggressive eval/ rx at this point unless there is a clinical correlation suggesting unaddressed pulmonary infection (purulent sputum, night sweats, unintended wt loss, doe) or evolution of  obvious changes on plain cxr (as opposed to serial CT, which is way over sensitive to make clinical decisions re intervention and treatment in the elderly, who tend to tolerate both dx and treatment poorly) .   Will see back for pfts and regroup then but for now no fob/ did warn to hold xarelto for flare of hemoptysis   Discussed in detail all the  indications, usual  risks and alternatives  relative to the benefits with patient who agrees to proceed with conservative f/u as outlined    Total time devoted to counseling  > 50 % of initial 60 min office visit:  review case with pt/ discussion of options/alternatives/ personally creating written customized instructions  in presence of pt  then going over those specific  Instructions directly with the pt including how to use all of the meds but in particular covering each new medication in detail and the difference between the maintenance= "automatic" meds and the prns using an action plan format for the latter (If this problem/symptom => do that organization reading Left to right).  Please see AVS from this visit for a full list of these instructions which I personally wrote for this pt and  are unique to this visit.

## 2017-03-23 NOTE — Assessment & Plan Note (Signed)
She arrived in rapid a fib on arrival after toprol 50 mg in am with TSH of 0.23 so may just need her thyroid med reduced - see hyothyrodism.

## 2017-03-24 LAB — RESPIRATORY ALLERGY PROFILE REGION II ~~LOC~~
Allergen, A. alternata, m6: <0.1 kU/L
Allergen, Cedar tree, t12: <0.1 kU/L
Allergen, Cottonwood, t14: <0.1 kU/L
Allergen, D pternoyssinus,d7: <0.1 kU/L
Allergen, Mouse Urine Protein, e78: <0.1 kU/L
Allergen, Mulberry, t76: <0.1 kU/L
Allergen, Oak,t7: <0.1 kU/L
Allergen, P. notatum, m1: <0.1 kU/L
Aspergillus fumigatus, m3: 0.49 kU/L — ABNORMAL HIGH
Bermuda Grass: <0.1 kU/L
CLASS: 0
CLASS: 0
CLASS: 0
CLASS: 0
CLASS: 0
CLASS: 0
CLASS: 0
CLASS: 0
CLASS: 0
CLASS: 0
CLASS: 0
Cat Dander: <0.1 kU/L
Class: 0
Class: 0
Class: 0
Class: 0
Class: 0
Class: 0
Class: 0
Class: 0
Class: 0
Class: 0
Class: 0
Class: 0
Class: 1
Cockroach: <0.1 kU/L
D. farinae: <0.1 kU/L
IGE (IMMUNOGLOBULIN E), SERUM: 153 kU/L — AB (ref <=114)
Rough Pigweed  IgE: <0.1 kU/L
Sheep Sorrel IgE: <0.1 kU/L
Timothy Grass: <0.1 kU/L

## 2017-03-24 LAB — INTERPRETATION:

## 2017-03-25 ENCOUNTER — Telehealth: Payer: Self-pay | Admitting: Internal Medicine

## 2017-03-25 NOTE — Telephone Encounter (Signed)
Spoke with the pt and went over her lab results and recs again  She verbalized understanding  I refaxed the TSH results to her PCP

## 2017-03-25 NOTE — Progress Notes (Signed)
Spoke with pt and notified of results per Dr. Wert. Pt verbalized understanding and denied any questions. 

## 2017-05-13 ENCOUNTER — Ambulatory Visit: Payer: Medicare Other | Admitting: Internal Medicine

## 2017-05-13 ENCOUNTER — Encounter: Payer: Self-pay | Admitting: Internal Medicine

## 2017-05-13 ENCOUNTER — Ambulatory Visit (INDEPENDENT_AMBULATORY_CARE_PROVIDER_SITE_OTHER): Payer: Medicare Other | Admitting: Internal Medicine

## 2017-05-13 VITALS — BP 136/84 | HR 113 | Ht 62.5 in | Wt 102.0 lb

## 2017-05-13 DIAGNOSIS — I272 Pulmonary hypertension, unspecified: Secondary | ICD-10-CM

## 2017-05-13 DIAGNOSIS — J479 Bronchiectasis, uncomplicated: Secondary | ICD-10-CM

## 2017-05-13 DIAGNOSIS — R05 Cough: Secondary | ICD-10-CM

## 2017-05-13 DIAGNOSIS — R058 Other specified cough: Secondary | ICD-10-CM

## 2017-05-13 LAB — PULMONARY FUNCTION TEST
DL/VA % pred: 103 %
DL/VA: 4.77 ml/min/mmHg/L
DLCO COR: 14.11 ml/min/mmHg
DLCO UNC % PRED: 59 %
DLCO cor % pred: 63 %
DLCO unc: 13.21 ml/min/mmHg
FEF 25-75 Pre: 0.67 L/sec
FEF2575-%Pred-Pre: 50 %
FEV1-%Pred-Pre: 85 %
FEV1-Pre: 1.54 L
FEV1FVC-%PRED-PRE: 86 %
FEV6-%PRED-PRE: 102 %
FEV6-Pre: 2.35 L
FEV6FVC-%Pred-Pre: 103 %
FVC-%PRED-PRE: 98 %
FVC-Pre: 2.4 L
PRE FEV6/FVC RATIO: 98 %
Pre FEV1/FVC ratio: 64 %
RV % PRED: 155 %
RV: 3.6 L
TLC % PRED: 115 %
TLC: 5.58 L

## 2017-05-13 NOTE — Patient Instructions (Addendum)
No change in your medications until the pantaprazole (protonix)  expires (all the refills)  Then stop it  Continue the diet and the pepcid (famotidine) at bedtime and when you run out of the pantaprazole refills then take the pepcid after bfast also   Please schedule a follow up visit in 3 months but call sooner if needed with cxr on return

## 2017-05-13 NOTE — Progress Notes (Signed)
Subjective:     Patient ID: Shelly Fields, female   DOB: 09/19/1934,    MRN: [id number]    Brief patient profile:  88 yowf never smoked started with  Chronic cough  Developed 12/2015 mostly with bending over with mostly yellowish with steaks of blood while xarelto but for the most part no blood and w/u ? MAI in [place] so referred to pulmonary clinic 03/22/2017 by Nicanor Bake     History of Present Illness  03/22/2017 1st [facility] office visit/ Sabian Kuba   Chief Complaint  Patient presents with  . Pulmonary Consult    Referred by Dr. Woody Seller for eval of [facility]. Pt c/o occ dry cough.   most days x 15 months only "spits up" mucus (clears throat "I don't cough" )  in afternoons < tbsp yellowish thick mucus  Not limited by breathing from desired activities   Assoc with voice fatigue rec Pantoprazole (protonix) 40 mg   Take  30-60 min before first meal of the day and Pepcid (famotidine)  20 mg one @  bedtime until return to office - this is the best way to tell whether stomach acid is contributing to your problem.   GERD (REFLUX)  Diet    05/13/2017  f/u ov/Issabella Rix re:   MAI/ cough has resolved on GERD rx  Not limited by breathing from desired activities   No am excess mucus  No obvious day to day or daytime variability or assoc  purulent sputum or mucus plugs or hemoptysis or cp or chest tightness, subjective wheeze or overt sinus or hb symptoms. No unusual exposure hx or h/o childhood pna/ asthma or knowledge of premature birth.  Sleeping ok flat without nocturnal  or early am exacerbation  of respiratory  c/o's or need for noct saba. Also denies any obvious fluctuation of symptoms with weather or environmental changes or other aggravating or alleviating factors except as outlined above   Current Allergies, Complete Past Medical History, Past Surgical History, Family History, and Social History were reviewed in [employer] record.  ROS  The following are not active  complaints unless bolded Hoarseness, sore throat, dysphagia, dental problems, itching, sneezing,  nasal congestion or discharge of excess mucus or purulent secretions, ear ache,   fever, chills, sweats, unintended wt loss or wt gain, classically pleuritic or exertional cp,  orthopnea pnd or leg swelling, presyncope, palpitations, abdominal pain, anorexia, nausea, vomiting, diarrhea  or change in bowel habits or change in bladder habits, change in stools or change in urine, dysuria, hematuria,  rash, arthralgias, visual complaints, headache, numbness, weakness or ataxia or problems with walking or coordination,  change in mood/affect or memory.        Current Meds  Medication Sig  . CALCIUM GLUCONATE PO Take 1 tablet by mouth 2 (two) times daily.  Marland Kitchen docusate sodium (COLACE) 100 MG capsule Take 200 mg by mouth at bedtime.   . famotidine (PEPCID) 20 MG tablet One at bedtime  . levothyroxine (SYNTHROID, LEVOTHROID) 75 MCG tablet Take 75 mcg by mouth daily before breakfast.  . loratadine (CLARITIN) 10 MG tablet Take 10 mg by mouth daily as needed for allergies.  . metoprolol succinate (TOPROL-XL) 25 MG 24 hr tablet TAKE 1 TABLET BY MOUTH AT  BEDTIME  . metoprolol succinate (TOPROL-XL) 50 MG 24 hr tablet Take 1 tablet (50 mg total) by mouth daily. Take with or immediately following a meal.  . pantoprazole (PROTONIX) 40 MG tablet Take 1 tablet (40 mg  total) by mouth daily. Take 30-60 min before first meal of the day  . rivaroxaban (XARELTO) 20 MG TABS tablet Take 1 tablet (20 mg total) by mouth daily.  Marland Kitchen senna (SENOKOT) 8.6 MG tablet Take 1 tablet by mouth at bedtime as needed for constipation.                Objective:   Physical Exam  Thin amb hoarse wf nad       05/13/2017      102   03/22/17 101 lb (45.8 kg)  03/18/17 101 lb (45.8 kg)  09/15/16 97 lb 12.8 oz (44.4 kg)    Vital signs reviewed - Note on arrival 02 sats  100% on RA        HEENT: nl dentition, turbinates bilaterally, and  oropharynx. Nl external ear canals without cough reflex   NECK :  without JVD/Nodes/TM/ nl carotid upstrokes bilaterally   LUNGS: no acc muscle use,  Nl contour chest which is clear to A and P bilaterally without cough on insp or exp maneuvers   CV:  IRIR   no s3 or murmur or increase in P2, and no edema   ABD:  soft and nontender with nl inspiratory excursion in the supine position. No bruits or organomegaly appreciated, bowel sounds nl  MS:  Nl gait/ ext warm without deformities, calf tenderness, cyanosis or clubbing No obvious joint restrictions   SKIN: warm and dry without lesions    NEURO:  alert, approp, nl sensorium with  no motor or cerebellar deficits apparent.        Assessment:

## 2017-05-13 NOTE — Progress Notes (Signed)
PFT done today. 

## 2017-05-15 ENCOUNTER — Encounter: Payer: Self-pay | Admitting: Internal Medicine

## 2017-05-15 NOTE — Assessment & Plan Note (Signed)
Allergy profile 03/22/2017 >  Eos 0.3 /  IgE  153  RAST pos Aspergillus  - resolved on gerd rx as of 05/13/2017     Much better so rec complete 3 months rx then try off and ? GI eval if cough relapses off acid suppression

## 2017-05-15 NOTE — Assessment & Plan Note (Signed)
Chest CT  01/06/17  C/w MAI with "bronciolectasis" L > R bases / no real change since  02/11/16  but new findings c/w PH - PFT's  05/13/2017  FEV1 1.54 (85 % ) ratio 64   p nothing prior to study with DLCO  59/63c % corrects to 103 % for alv volume    She does have very mild airflow obstruction but no pulmonary symptoms at this point   No need for specific rx/ hold xarelto for any significant hemoptysis.  Could treat flares with levaquin 500 x 7-10 days if needed

## 2017-05-15 NOTE — Assessment & Plan Note (Signed)
Echocardiogram 02/01/2017 [facility]): Normal LV wall thickness with LVEF 65-70%, normal left atrial chamber size, moderate right atrial enlargement, mildly calcified aortic valve with mild aortic regurgitation, mild mitral annular calcification with mild mitral regurgitation, severe tricuspid regurgitation with RVSP 30-40 mmHg  No symptoms assoc with this dx/ would need [facility] to confirm if symptoms warrant.

## 2017-08-11 ENCOUNTER — Telehealth: Payer: Self-pay | Admitting: Internal Medicine

## 2017-08-11 ENCOUNTER — Ambulatory Visit: Payer: Medicare Other | Admitting: Internal Medicine

## 2017-08-11 ENCOUNTER — Encounter: Payer: Self-pay | Admitting: Internal Medicine

## 2017-08-11 ENCOUNTER — Ambulatory Visit (INDEPENDENT_AMBULATORY_CARE_PROVIDER_SITE_OTHER)
Admission: RE | Admit: 2017-08-11 | Discharge: 2017-08-11 | Disposition: A | Discharge status: Home / Self Care | Payer: Medicare Other | Source: Ambulatory Visit | Attending: Internal Medicine | Admitting: Internal Medicine

## 2017-08-11 VITALS — BP 144/88 | HR 90 | Ht 62.5 in | Wt 102.0 lb

## 2017-08-11 DIAGNOSIS — J479 Bronchiectasis, uncomplicated: Secondary | ICD-10-CM | POA: Diagnosis not present

## 2017-08-11 DIAGNOSIS — I272 Pulmonary hypertension, unspecified: Secondary | ICD-10-CM | POA: Diagnosis not present

## 2017-08-11 NOTE — Patient Instructions (Signed)
For cough mucinex 600 mg up to 2 every 12 hours    If the mucus turns nasty or bloody please call    Please schedule a follow up visit in 6 months but call sooner if needed with cxr  On retrun

## 2017-08-11 NOTE — Telephone Encounter (Signed)
Called and spoke with patient, she states that she has not gained weight. The scale said 102 and not 107. Patient does not want MW to think she has gained weight when she has not. MW sending this as an Pharmacist, hospital

## 2017-08-11 NOTE — Progress Notes (Signed)
Subjective:     Patient ID: Shelly Fields, female   DOB: 1935-02-11,    MRN: [id number]    Brief patient profile:  33 yowf never smoked started with  Chronic cough  Developed 12/2015 mostly with bending over with mostly yellowish with steaks of blood while xarelto but for the most part no blood and w/u ? MAI in [place] so referred to pulmonary clinic 03/22/2017 by Nicanor Bake     History of Present Illness  03/22/2017 1st [facility] office visit/ Wert   Chief Complaint  Patient presents with  . Pulmonary Consult    Referred by Dr. Woody Seller for eval of [facility]. Pt c/o occ dry cough.   most days x 15 months only "spits up" mucus (clears throat "I don't cough" )  in afternoons < tbsp yellowish thick mucus  Not limited by breathing from desired activities   Assoc with voice fatigue rec Pantoprazole (protonix) 40 mg   Take  30-60 min before first meal of the day and Pepcid (famotidine)  20 mg one @  bedtime until return to office - this is the best way to tell whether stomach acid is contributing to your problem.   GERD (REFLUX)  Diet    05/13/2017  f/u ov/Wert re:   MAI/ cough has resolved on GERD rx  Not limited by breathing from desired activities   No am excess mucus rec No change in your medications until the pantaprazole (protonix)  expires (all the refills)  Then stop it Continue the diet and the pepcid (famotidine) at bedtime and when you run out of the pantaprazole refills then take the pepcid after bfast also    08/11/2017  f/u ov/Wert re: Bronchiectasis, no worse symptoms off ppi/h2  Chief Complaint  Patient presents with  . Follow-up    Cxr done today. Cough is overall improved. She has occ cough with yellow sputum.    Dyspnea: Not limited by breathing from desired activities   Cough: minimal light yellow/ never bloody/ most notable in am  Sleep: ok s am flare of cough  SABA use:  NONE      No  obvious day to day or daytime variability or assoc  mucus plugs or  hemoptysis or cp or chest tightness, subjective wheeze or overt sinus or hb symptoms. No unusual exposure hx or h/o childhood pna/ asthma or knowledge of premature birth.  Sleeping ok flat without nocturnal  or early am exacerbation  of respiratory  c/o's or need for noct saba. Also denies any obvious fluctuation of symptoms with weather or environmental changes or other aggravating or alleviating factors except as outlined above   Current Allergies, Complete Past Medical History, Past Surgical History, Family History, and Social History were reviewed in [employer] record.  ROS  The following are not active complaints unless bolded Hoarseness, sore throat, dysphagia, dental problems, itching, sneezing,  nasal congestion or discharge of excess mucus or purulent secretions, ear ache,   fever, chills, sweats, unintended wt loss or wt gain, classically pleuritic or exertional cp,  orthopnea pnd or leg swelling, presyncope, palpitations, abdominal pain, anorexia, nausea, vomiting, diarrhea  or change in bowel habits or change in bladder habits, change in stools or change in urine, dysuria, hematuria,  rash, arthralgias, visual complaints, headache, numbness, weakness or ataxia or problems with walking or coordination,  change in mood/affect or memory.        Current Meds  Medication Sig  . CALCIUM GLUCONATE PO Take  1 tablet by mouth 2 (two) times daily.  Marland Kitchen docusate sodium (COLACE) 100 MG capsule Take 200 mg by mouth at bedtime.   Marland Kitchen levothyroxine (SYNTHROID, LEVOTHROID) 75 MCG tablet Take 75 mcg by mouth daily before breakfast.  . loratadine (CLARITIN) 10 MG tablet Take 10 mg by mouth daily as needed for allergies.  . metoprolol succinate (TOPROL-XL) 25 MG 24 hr tablet TAKE 1 TABLET BY MOUTH AT  BEDTIME  . metoprolol succinate (TOPROL-XL) 50 MG 24 hr tablet Take 1 tablet (50 mg total) by mouth daily. Take with or immediately following a meal.  . rivaroxaban (XARELTO) 20 MG TABS  tablet Take 1 tablet (20 mg total) by mouth daily.  Marland Kitchen senna (SENOKOT) 8.6 MG tablet Take 1 tablet by mouth at bedtime as needed for constipation.             Objective:   Physical Exam  Thin very pleasant amb wf nad      08/11/2017         102   05/13/2017      102   03/22/17 101 lb (45.8 kg)  03/18/17 101 lb (45.8 kg)  09/15/16 97 lb 12.8 oz (44.4 kg)      Vital signs reviewed - Note on arrival 02 sats  97% on RA     CV:  IRIR     Min rhonchi bilat   HEENT: nl dentition, turbinates bilaterally, and oropharynx. Nl external ear canals without cough reflex   NECK :  without JVD/Nodes/TM/ nl carotid upstrokes bilaterally   LUNGS: no acc muscle use,  Nl contour chest  With non-localized insp / exp rhonchi bilaterally    CV: IRIR no s3 or murmur or increase in P2, and no edema   ABD:  soft and nontender with nl inspiratory excursion in the supine position. No bruits or organomegaly appreciated, bowel sounds nl  MS:  Nl gait/ ext warm without deformities, calf tenderness, cyanosis or clubbing No obvious joint restrictions   SKIN: warm and dry without lesions    NEURO:  alert, approp, nl sensorium with  no motor or cerebellar deficits apparent.         CXR PA and Lateral:   08/11/2017 :    I personally reviewed images and agree with radiology impression as follows:   COPD. Right middle lobe airspace disease as noted previously. No new findings.           Assessment:

## 2017-08-11 NOTE — Telephone Encounter (Signed)
aware

## 2017-08-12 ENCOUNTER — Encounter: Payer: Self-pay | Admitting: Internal Medicine

## 2017-08-12 ENCOUNTER — Telehealth: Payer: Self-pay | Admitting: Internal Medicine

## 2017-08-12 NOTE — Telephone Encounter (Signed)
Called and spoke with patient, she is aware of results and verbalized understanding.

## 2017-08-12 NOTE — Progress Notes (Signed)
lmtcb

## 2017-08-12 NOTE — Assessment & Plan Note (Addendum)
Chest CT  01/06/17  C/w MAI with "bronciolectasis" L > R bases / no real change since  02/11/16  but new findings c/w PH - PFT's  05/13/2017  FEV1 1.54 (85 % ) ratio 64   p nothing prior to study with DLCO  59/63c % corrects to 103 % for alv volume  No evidence of clinical or radiographic progression off bronchodilators and gerd rx so appears to have adequate control on present rx, reviewed in detail with pt > no change in rx needed  / concerned for risk hemoptysis on xarelto for AFib and reminded of need to d/c xarelto for any bleeding noted and use mucinex max doses prn   I had an extended discussion with the patient reviewing all relevant studies completed to date and  lasting 15 to 20 minutes of a 25 minute visit    Each maintenance medication was reviewed in detail including most importantly the difference between maintenance and prns and under what circumstances the prns are to be triggered using an action plan format that is not reflected in the computer generated alphabetically organized AVS.    Please see AVS for specific instructions unique to this visit that I personally wrote and verbalized to the the pt in detail and then reviewed with pt  by my nurse highlighting any  changes in therapy recommended at today's visit to their plan of care.

## 2017-08-12 NOTE — Assessment & Plan Note (Signed)
Echocardiogram 02/01/2017 [facility]): Normal LV wall thickness with LVEF 65-70%, normal left atrial chamber size, moderate right atrial enlargement, mildly calcified aortic valve with mild aortic regurgitation, mild mitral annular calcification with mild mitral regurgitation, severe tricuspid regurgitation with RVSP 30-40 mmHg   No symptoms of cor pulmonale but may have a low grade WHO group III pattern and if any worsening first step would be ONO RA to be sure not desaturating related to bronchiectasis rather than w/u as for PAH, which I don't think she's likely to have.  Discussed in detail all the  indications, usual  risks and alternatives  relative to the benefits with patient who agrees to proceed with conservative f/u as outlined

## 2017-08-26 ENCOUNTER — Other Ambulatory Visit: Payer: Self-pay | Admitting: Cardiology

## 2017-09-08 ENCOUNTER — Other Ambulatory Visit: Payer: Self-pay

## 2017-09-08 ENCOUNTER — Ambulatory Visit (INDEPENDENT_AMBULATORY_CARE_PROVIDER_SITE_OTHER): Payer: Medicare Other

## 2017-09-08 DIAGNOSIS — I361 Nonrheumatic tricuspid (valve) insufficiency: Secondary | ICD-10-CM

## 2017-09-10 ENCOUNTER — Other Ambulatory Visit (HOSPITAL_COMMUNITY): Payer: Self-pay | Admitting: Nurse Practitioner

## 2017-09-10 ENCOUNTER — Telehealth: Payer: Self-pay

## 2017-09-10 DIAGNOSIS — Z1231 Encounter for screening mammogram for malignant neoplasm of breast: Secondary | ICD-10-CM

## 2017-09-10 NOTE — Telephone Encounter (Signed)
Patient notified. Routed to PCP 

## 2017-09-10 NOTE — Telephone Encounter (Signed)
-----   Message from Acquanetta Chain, LPN sent at 01/06/8562  3:38 PM EDT -----   ----- Message ----- From: Satira Sark, MD Sent: 09/09/2017   9:45 AM To: Merlene Laughter, LPN  Results reviewed.  Tricuspid regurgitation reported in severe range similar to previous outside study at [facility].  RV function only mildly reduced.  Continue with current follow-up plan. A copy of this test should be forwarded to [place], [place].

## 2017-09-16 ENCOUNTER — Other Ambulatory Visit: Payer: Medicare Other

## 2017-10-14 ENCOUNTER — Ambulatory Visit: Payer: Medicare Other | Admitting: Cardiology

## 2017-10-15 ENCOUNTER — Other Ambulatory Visit: Payer: Self-pay | Admitting: Cardiology

## 2017-10-18 ENCOUNTER — Encounter (HOSPITAL_COMMUNITY): Payer: Self-pay

## 2017-10-18 ENCOUNTER — Ambulatory Visit (HOSPITAL_COMMUNITY)
Admission: RE | Admit: 2017-10-18 | Discharge: 2017-10-18 | Disposition: A | Discharge status: Home / Self Care | Payer: Medicare Other | Source: Ambulatory Visit | Attending: Nurse Practitioner | Admitting: Nurse Practitioner

## 2017-10-18 DIAGNOSIS — Z1231 Encounter for screening mammogram for malignant neoplasm of breast: Secondary | ICD-10-CM | POA: Diagnosis not present

## 2017-10-22 ENCOUNTER — Encounter (INDEPENDENT_AMBULATORY_CARE_PROVIDER_SITE_OTHER): Payer: Self-pay | Admitting: *Deleted

## 2017-10-22 ENCOUNTER — Telehealth (INDEPENDENT_AMBULATORY_CARE_PROVIDER_SITE_OTHER): Payer: Self-pay | Admitting: *Deleted

## 2017-10-22 ENCOUNTER — Ambulatory Visit (INDEPENDENT_AMBULATORY_CARE_PROVIDER_SITE_OTHER): Payer: Medicare Other | Admitting: Internal Medicine

## 2017-10-22 ENCOUNTER — Encounter (INDEPENDENT_AMBULATORY_CARE_PROVIDER_SITE_OTHER): Payer: Self-pay | Admitting: Internal Medicine

## 2017-10-22 VITALS — BP 114/82 | HR 84 | Temp 97.6°F | Ht 62.0 in | Wt 101.7 lb

## 2017-10-22 DIAGNOSIS — R195 Other fecal abnormalities: Secondary | ICD-10-CM | POA: Diagnosis not present

## 2017-10-22 MED ORDER — PEG 3350-KCL-NA BICARB-NACL 420 G PO SOLR: 4000.0000 mL | Freq: Once | ORAL | 0 refills | Status: AC

## 2017-10-22 NOTE — Progress Notes (Signed)
   Subjective:    Patient ID: Shelly Fields, female    DOB: 12/23/1934, 83 y.o.   MRN: [id number]  HPI  Here today for f/u. Last seen in 2016 with positive stool card. Checked in office and was negative. Three stool cards sent home with patient and all were negative.   She went to a wellness check at [facility] in April of this year . She says she did a stool card and it was positive.  She says she has not seen any blood. Stools are brown in color. One stool a day. No change in her stools. Her appetite is good. No weight loss. Hx of atrial fib and maintained on Xarelto.    01/12/2013 Colonoscopy  Indications: Patient is 83 year old Caucasian female with history of colonic adenoma and family history of colonic polyps.  Impression:  Examination performed to cecum. 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 Left-sided diverticulosis. External hemorrhoids and single anal papilla. No evidence of recurrent polyps.   Review of Systems     Objective:   Physical Exam Blood pressure 114/82, pulse 84, temperature 97.6 F (36.4 C), height 5\' 2"  (1.575 m), weight 101 lb 11.2 oz (46.1 kg). Alert and oriented. Skin warm and dry. Oral mucosa is moist.   . Sclera anicteric, conjunctivae is pink. Thyroid not enlarged. No cervical lymphadenopathy. Lungs clear. Heart rate irregula.  Abdomen is soft. Bowel sounds are positive. No hepatomegaly. No abdominal masses felt. No tenderness.  No edema to lower extremities.           Assessment & Plan:  Guaiac positive. Colonic neoplasm needs to be ruled out. Bleeding hemorrhoids in the differential.

## 2017-10-22 NOTE — Patient Instructions (Signed)
The risks of bleeding, perforation and infection were reviewed with patient.  

## 2017-10-22 NOTE — Telephone Encounter (Signed)
Patient needs trilyte 

## 2017-10-22 NOTE — Telephone Encounter (Signed)
Patient is scheduled for colonoscopy 11/18/17 -- she needs to stop xarelto 2 days before procedure -- please advise if ok to stop, thanks

## 2017-10-24 NOTE — Telephone Encounter (Signed)
She may hold Xarelto as requested prior to colonoscopy.

## 2017-10-25 NOTE — Telephone Encounter (Signed)
Patient aware.

## 2017-10-26 NOTE — Progress Notes (Signed)
Cardiology Office Note  Date: 10/27/2017   ID: Shelly Fields, DOB 09-20-1934, MRN [id number]  PCP: Berenice Primas  Primary Cardiologist: Rozann Lesches, MD   Chief Complaint  Patient presents with  . Atrial Fibrillation    History of Present Illness: Shelly Fields is an 83 y.o. female last seen in October 2018.  She presents for a routine visit.  Reports overall stable dyspnea on exertion, no significant hemoptysis or progressive cough.  She remains active with ADLs, enjoys working in her flowers outside but has to pace herself.  Still goes to the [facility] to do light exercises with friends.  Follow-up echocardiogram done in April revealed LVEF 60 to 65% with mild to moderate mitral regurgitation, mild aortic regurgitation, and stable severe tricuspid regurgitation with significant right atrial enlargement, estimated PASP 26 mmHg.  Current cardiac regimen includes Toprol-XL for heart rate control and Xarelto for stroke prophylaxis.  She will be having lab work with PCP this summer.  Past Medical History:  Diagnosis Date  . Diverticulosis   . Essential hypertension, benign   . Hypothyroidism   . Internal hemorrhoids   . Paroxysmal atrial fibrillation (HCC)   . Seasonal allergies   . Shingles     Past Surgical History:  Procedure Laterality Date  . ABDOMINAL HYSTERECTOMY    . CATARACT EXTRACTION    . COLONOSCOPY N/A 01/12/2013   Procedure: COLONOSCOPY;  Surgeon: Rogene Houston, MD;  [facility];  Service: Endoscopy;  Laterality: N/A;  1030    Current Outpatient Medications  Medication Sig Dispense Refill  . CALCIUM GLUCONATE PO Take 2 tablets by mouth 2 (two) times daily. 300mg     . docusate sodium (COLACE) 100 MG capsule Take 200 mg by mouth at bedtime.     Marland Kitchen levothyroxine (SYNTHROID, LEVOTHROID) 50 MCG tablet Take 50 mcg by mouth every other day.    . loratadine (CLARITIN) 10 MG tablet Take 10 mg by mouth daily as needed for allergies.    . metoprolol  succinate (TOPROL-XL) 25 MG 24 hr tablet TAKE 1 TABLET BY MOUTH AT  BEDTIME 90 tablet 3  . metoprolol succinate (TOPROL-XL) 50 MG 24 hr tablet TAKE 1 TABLET BY MOUTH  DAILY WITH OR IMMEDIATLEY  FOLLOWING A MEAL 90 tablet 0  . senna (SENOKOT) 8.6 MG tablet Take 1 tablet by mouth at bedtime as needed for constipation.    Alveda Reasons 20 MG TABS tablet TAKE 1 TABLET BY MOUTH  DAILY 30 tablet 0   No current facility-administered medications for this visit.    Allergies:  Patient has no known allergies.   Social History: The patient  reports that she has never smoked. She has never used smokeless tobacco. She reports that she does not drink alcohol or use drugs.   ROS:  Please see the history of present illness. Otherwise, complete review of systems is positive for hearing loss.  All other systems are reviewed and negative.   Physical Exam: VS:  BP 130/78   Pulse 68   Ht 5\' 2"  (1.575 m)   Wt 101 lb 9.6 oz (46.1 kg)   SpO2 99%   BMI 18.58 kg/m , BMI Body mass index is 18.58 kg/m.  Wt Readings from Last 3 Encounters:  10/27/17 101 lb 9.6 oz (46.1 kg)  10/22/17 101 lb 11.2 oz (46.1 kg)  08/11/17 102 lb (46.3 kg)    General: Thin elderly woman, appears comfortable at rest. HEENT: Conjunctiva and lids normal, oropharynx  clear. Neck: Supple, no elevated JVP or carotid bruits, no thyromegaly. Lungs: No crackles or wheezing, nonlabored breathing at rest. Cardiac: Irregularly irregular, no S3, soft apical systolic murmur, no pericardial rub. Abdomen: Soft, nontender, bowel sounds present. Extremities: Trace ankle edema, distal pulses 2+. Skin: Warm and dry. Musculoskeletal: Mild kyphosis. Neuropsychiatric: Alert and oriented x3, affect grossly appropriate.  ECG: I personally reviewed the tracing from 03/22/2017 which showed atrial fibrillation with R' in V1 and V2.  Recent Labwork: 03/22/2017: BUN 13; Creatinine, Ser 0.73; Hemoglobin 13.8; Platelets 280.0; Potassium 4.5; Pro B Natriuretic  peptide (BNP) 232.0; Sodium 131; TSH 0.24   Other Studies Reviewed Today:  Echocardiogram 09/08/2017: Study Conclusions  - Left ventricle: The cavity size was normal. Wall thickness was   normal. Systolic function was normal. The estimated ejection   fraction was in the range of 60% to 65%. Wall motion was normal;   there were no regional wall motion abnormalities. The study was   not technically sufficient to allow evaluation of LV diastolic   dysfunction due to atrial fibrillation. - Aortic valve: There was mild regurgitation. - Mitral valve: There was mild to moderate regurgitation. - Left atrium: The atrium was severely dilated. - Right atrium: The atrium was massively dilated. - Tricuspid valve: There was severe regurgitation. - Systemic veins: IVC is dilated with normal respiratory variation.   Estimated CVP 8 mmHg.  Assessment and Plan:  1.  Persistent atrial fibrillation with CHADSVASC score of 5.  She reports no significant palpitations and heart rate control looks to be adequate on current dose of Toprol-XL.  Continue Xarelto for stroke prophylaxis.  She has follow-up lab work with PCP this summer.  2.  Severe tricuspid regurgitation, stable by recent echocardiogram and PASP estimated 26 mmHg.  She does have chronic lung disease which could be contributing.  3.  Essential hypertension, blood pressure control is adequate today.  4.  Hypothyroidism, on Synthroid with follow-up per PCP.  Current medicines were reviewed with the patient today.  Disposition: Follow-up in 6 months.  Signed, Satira Sark, MD, [facility] 10/27/2017 9:00 AM    [facility] [place], [place], [place] [address] Phone: [phone]; Fax: [phone]

## 2017-10-27 ENCOUNTER — Ambulatory Visit: Payer: Medicare Other | Admitting: Cardiology

## 2017-10-27 ENCOUNTER — Encounter: Payer: Self-pay | Admitting: Cardiology

## 2017-10-27 ENCOUNTER — Encounter (INDEPENDENT_AMBULATORY_CARE_PROVIDER_SITE_OTHER): Payer: Self-pay | Admitting: *Deleted

## 2017-10-27 VITALS — BP 130/78 | HR 68 | Ht 62.0 in | Wt 101.6 lb

## 2017-10-27 DIAGNOSIS — E039 Hypothyroidism, unspecified: Secondary | ICD-10-CM | POA: Diagnosis not present

## 2017-10-27 DIAGNOSIS — I10 Essential (primary) hypertension: Secondary | ICD-10-CM

## 2017-10-27 DIAGNOSIS — I361 Nonrheumatic tricuspid (valve) insufficiency: Secondary | ICD-10-CM

## 2017-10-27 DIAGNOSIS — I481 Persistent atrial fibrillation: Secondary | ICD-10-CM | POA: Diagnosis not present

## 2017-10-27 DIAGNOSIS — I4819 Other persistent atrial fibrillation: Secondary | ICD-10-CM

## 2017-10-27 NOTE — Patient Instructions (Signed)

## 2017-10-28 ENCOUNTER — Telehealth (INDEPENDENT_AMBULATORY_CARE_PROVIDER_SITE_OTHER): Payer: Self-pay | Admitting: *Deleted

## 2017-10-28 NOTE — Telephone Encounter (Signed)
Patient wants to make you have note din her chart that she has a irregular heart burn

## 2017-11-02 NOTE — Telephone Encounter (Signed)
Noted in my notes

## 2017-11-12 ENCOUNTER — Other Ambulatory Visit: Payer: Self-pay | Admitting: Cardiology

## 2017-11-18 ENCOUNTER — Encounter (HOSPITAL_COMMUNITY): Payer: Self-pay | Admitting: *Deleted

## 2017-11-18 ENCOUNTER — Ambulatory Visit (HOSPITAL_COMMUNITY)
Admission: RE | Admit: 2017-11-18 | Discharge: 2017-11-18 | Disposition: A | Discharge status: Home / Self Care | Payer: Medicare Other | Source: Ambulatory Visit | Attending: Internal Medicine | Admitting: Internal Medicine

## 2017-11-18 ENCOUNTER — Encounter (HOSPITAL_COMMUNITY)
Admission: RE | Disposition: A | Discharge status: Home / Self Care | Payer: Self-pay | Source: Ambulatory Visit | Attending: Internal Medicine

## 2017-11-18 ENCOUNTER — Other Ambulatory Visit: Payer: Self-pay

## 2017-11-18 DIAGNOSIS — I10 Essential (primary) hypertension: Secondary | ICD-10-CM | POA: Insufficient documentation

## 2017-11-18 DIAGNOSIS — K6389 Other specified diseases of intestine: Secondary | ICD-10-CM | POA: Insufficient documentation

## 2017-11-18 DIAGNOSIS — Z79890 Hormone replacement therapy (postmenopausal): Secondary | ICD-10-CM | POA: Diagnosis not present

## 2017-11-18 DIAGNOSIS — K5730 Diverticulosis of large intestine without perforation or abscess without bleeding: Secondary | ICD-10-CM | POA: Diagnosis not present

## 2017-11-18 DIAGNOSIS — D123 Benign neoplasm of transverse colon: Secondary | ICD-10-CM | POA: Diagnosis not present

## 2017-11-18 DIAGNOSIS — I480 Paroxysmal atrial fibrillation: Secondary | ICD-10-CM | POA: Insufficient documentation

## 2017-11-18 DIAGNOSIS — K648 Other hemorrhoids: Secondary | ICD-10-CM | POA: Insufficient documentation

## 2017-11-18 DIAGNOSIS — K644 Residual hemorrhoidal skin tags: Secondary | ICD-10-CM | POA: Insufficient documentation

## 2017-11-18 DIAGNOSIS — R195 Other fecal abnormalities: Secondary | ICD-10-CM | POA: Diagnosis not present

## 2017-11-18 DIAGNOSIS — Z79899 Other long term (current) drug therapy: Secondary | ICD-10-CM | POA: Insufficient documentation

## 2017-11-18 DIAGNOSIS — Z7901 Long term (current) use of anticoagulants: Secondary | ICD-10-CM | POA: Insufficient documentation

## 2017-11-18 DIAGNOSIS — E039 Hypothyroidism, unspecified: Secondary | ICD-10-CM | POA: Diagnosis not present

## 2017-11-18 HISTORY — PX: COLONOSCOPY: SHX5424

## 2017-11-18 HISTORY — PX: POLYPECTOMY: SHX5525

## 2017-11-18 SURGERY — COLONOSCOPY
Anesthesia: Moderate Sedation

## 2017-11-18 MED ORDER — SODIUM CHLORIDE 0.9 % IV SOLN
INTRAVENOUS | Status: DC
  Administered 2017-11-18: 13:00:00 via INTRAVENOUS

## 2017-11-18 MED ORDER — MEPERIDINE HCL 50 MG/ML IJ SOLN
INTRAMUSCULAR | Status: AC
  Filled 2017-11-18: qty 1

## 2017-11-18 MED ORDER — MEPERIDINE HCL 50 MG/ML IJ SOLN
INTRAMUSCULAR | Status: DC | PRN
  Administered 2017-11-18: 25 mg via INTRAVENOUS

## 2017-11-18 MED ORDER — STERILE WATER FOR IRRIGATION IR SOLN
Status: DC | PRN
  Administered 2017-11-18: 14:00:00

## 2017-11-18 MED ORDER — MIDAZOLAM HCL 5 MG/5ML IJ SOLN
INTRAMUSCULAR | Status: AC
  Filled 2017-11-18: qty 10

## 2017-11-18 MED ORDER — MIDAZOLAM HCL 5 MG/5ML IJ SOLN
INTRAMUSCULAR | Status: DC | PRN
  Administered 2017-11-18 (×3): 1 mg via INTRAVENOUS

## 2017-11-18 NOTE — Op Note (Signed)
[facility] Patient Name: Shelly Fields Procedure Date: 11/18/2017 1:16 PM MRN: [id number] Date of Birth: [birth year] Attending MD: Hildred Laser , MD CSN: [id number] Age: 83 Admit Type: Outpatient Procedure:                Colonoscopy Indications:              Heme positive stool Providers:                Hildred Laser, MD, Otis Peak B. Sharon Seller, RN, Aram Candela Referring MD:             Virl Son Hairfield, FNP Medicines:                Meperidine 25 mg IV, Midazolam 3 mg IV Complications:            No immediate complications. Estimated Blood Loss:     Estimated blood loss was minimal. Procedure:                Pre-Anesthesia Assessment:                           - Prior to the procedure, a History and Physical                            was performed, and patient medications and                            allergies were reviewed. The patient's tolerance of                            previous anesthesia was also reviewed. The risks                            and benefits of the procedure and the sedation                            options and risks were discussed with the patient.                            All questions were answered, and informed consent                            was obtained. Prior Anticoagulants: The patient                            last took Xarelto (rivaroxaban) 3 days prior to the                            procedure. ASA Grade Assessment: III - A patient                            with severe systemic disease. After reviewing the  risks and benefits, the patient was deemed in                            satisfactory condition to undergo the procedure.                           After obtaining informed consent, the colonoscope                            was passed under direct vision. Throughout the                            procedure, the patient's blood pressure, pulse, and     oxygen saturations were monitored continuously. The                            [id number] ([id number]) scope was introduced through                            the anus and advanced to the the cecum, identified                            by appendiceal orifice and ileocecal valve. The                            colonoscopy was somewhat difficult due to a                            tortuous colon. Successful completion of the                            procedure was aided by changing the patient's                            position, using manual pressure and withdrawing and                            reinserting the scope. The patient tolerated the                            procedure well. The quality of the bowel                            preparation was excellent. The ileocecal valve,                            appendiceal orifice, and rectum were photographed. Scope In: 1:50:35 PM Scope Out: 2:14:19 PM Scope Withdrawal Time: 0 hours 9 minutes 10 seconds  Total Procedure Duration: 0 hours 23 minutes 44 seconds  Findings:      Skin tags were found on perianal exam.      A small polyp was found in the proximal transverse colon. The polyp was       sessile. Biopsies were taken with a cold forceps for histology.  Scattered medium-mouthed diverticula were found in the sigmoid colon.      A localized area of mild melanosis was found in the cecum.      External and internal hemorrhoids were found during retroflexion. The       hemorrhoids were medium-sized. Impression:               - Perianal skin tags found on perianal exam.                           - One small polyp in the proximal transverse colon.                            Biopsied.                           - Diverticulosis in the sigmoid colon.                           - Melanosis in the colon.                           - External and internal hemorrhoids. Moderate Sedation:      Moderate (conscious) sedation was administered  by the endoscopy nurse       and supervised by the endoscopist. The following parameters were       monitored: oxygen saturation, heart rate, blood pressure, CO2       capnography and response to care. Total physician intraservice time was       28 minutes. Recommendation:           - Patient has a contact number available for                            emergencies. The signs and symptoms of potential                            delayed complications were discussed with the                            patient. Return to normal activities tomorrow.                            Written discharge instructions were provided to the                            patient.                           - High fiber diet today.                           - Continue present medications.                           - Resume Xarelto (rivaroxaban) at prior dose  tomorrow.                           - Await pathology results.                           - No repeat colonoscopy due to age. Procedure Code(s):        --- Professional ---                           [phone], Colonoscopy, flexible; with biopsy, single                            or multiple                           G0500, Moderate sedation services provided by the                            same physician or other qualified health care                            professional performing a gastrointestinal                            endoscopic service that sedation supports,                            requiring the presence of an independent trained                            observer to assist in the monitoring of the                            patient's level of consciousness and physiological                            status; initial 15 minutes of intra-service time;                            patient age 17 years or older (additional time may                            be reported with [phone], as appropriate)                            [phone], Moderate sedation services provided by the                            same physician or other qualified health care                            professional performing the diagnostic or                            therapeutic  service that the sedation supports,                            requiring the presence of an independent trained                            observer to assist in the monitoring of the                            patient's level of consciousness and physiological                            status; each additional 15 minutes intraservice                            time (List separately in addition to code for                            primary service) Diagnosis Code(s):        --- Professional ---                           D12.3, Benign neoplasm of transverse colon (hepatic                            flexure or splenic flexure)                           K64.8, Other hemorrhoids                           K63.89, Other specified diseases of intestine                           K64.4, Residual hemorrhoidal skin tags                           R19.5, Other fecal abnormalities                           K57.30, Diverticulosis of large intestine without                            perforation or abscess without bleeding CPT copyright 2017 American Medical Association. All rights reserved. The codes documented in this report are preliminary and upon coder review may  be revised to meet current compliance requirements. Hildred Laser, MD Hildred Laser, MD 11/18/2017 2:21:44 PM This report has been signed electronically. Number of Addenda: 0

## 2017-11-18 NOTE — H&P (Signed)
Shelly Fields is an 83 y.o. female.   Chief Complaint: Patient is here for colonoscopy. HPI: Patient is 83 year old Caucasian female who was last colonoscopy was in August 2014 was noted to have heme positive stool and is here for diagnostic colonoscopy.  She has chronic constipation.  She takes stool softener daily and vegetable laxative every other day.  She denies melena or rectal bleeding or abdominal pain.  She has not had a recent hemoglobin.  Hemoglobin back in October 2018 was normal.  She does not take OTC NSAIDs. Patient has been off Xarelto for 3 days. Family history is negative for CRC.  Past Medical History:  Diagnosis Date  . Diverticulosis   . Essential hypertension, benign   . Hypothyroidism   . Internal hemorrhoids   . Paroxysmal atrial fibrillation (HCC)   . Seasonal allergies   . Shingles     Past Surgical History:  Procedure Laterality Date  . ABDOMINAL HYSTERECTOMY    . CATARACT EXTRACTION    . COLONOSCOPY N/A 01/12/2013   Procedure: COLONOSCOPY;  Surgeon: Rogene Houston, MD;  [facility];  Service: Endoscopy;  Laterality: N/A;  1030    Family History  Problem Relation Age of Onset  . Heart disease Mother   . Hypertension Father   . Colon cancer Neg Hx    Social History:  reports that she has never smoked. She has never used smokeless tobacco. She reports that she does not drink alcohol or use drugs.  Allergies: No Known Allergies  Medications Prior to Admission  Medication Sig Dispense Refill  . CALCIUM GLUCONATE PO Take 2 tablets by mouth 2 (two) times daily.    . Carboxymethylcellulose Sodium (THERATEARS OP) Place 1 drop into both eyes 2 (two) times daily.    Marland Kitchen levothyroxine (SYNTHROID, LEVOTHROID) 50 MCG tablet Take 50 mcg by mouth every other day.    . loratadine (CLARITIN) 10 MG tablet Take 10 mg by mouth daily as needed for allergies.    . metoprolol succinate (TOPROL-XL) 50 MG 24 hr tablet Take 50 mg by mouth at lunch and take 25 mg by  mouth at bedtime 135 tablet 2  . Multiple Vitamins-Minerals (CENTRUM SILVER 50+WOMEN PO) Take 1 tablet by mouth daily.    Marland Kitchen senna (SENOKOT) 8.6 MG tablet Take 1 tablet by mouth See admin instructions. Take 1 tablet by mouth every other night    . XARELTO 20 MG TABS tablet TAKE 1 TABLET BY MOUTH  DAILY (Patient taking differently: Take 20 mg by mouth in the evening) 30 tablet 0  . docusate sodium (COLACE) 100 MG capsule Take 100 mg by mouth at bedtime.       No results found for this or any previous visit (from the past 48 hour(s)). No results found.  ROS  Blood pressure (!) 150/100, pulse (!) 111, temperature 97.6 F (36.4 C), temperature source Oral, resp. rate 17, height 5\' 2"  (1.575 m), weight 101 lb (45.8 kg), SpO2 96 %. Physical Exam  Constitutional:  Well-developed thin Caucasian female in NAD.  HENT:  Mouth/Throat: Oropharynx is clear and moist.  Eyes: Conjunctivae are normal. No scleral icterus.  Neck: No thyromegaly present.  Cardiovascular:  Irregular rhythm normal S1 and S2.  No murmur or gallop noted.  Respiratory: Effort normal and breath sounds normal.  GI: Soft. She exhibits no distension and no mass. There is no tenderness.  Pfannenstiel scar.  Musculoskeletal: She exhibits no edema.  Lymphadenopathy:    She has no  cervical adenopathy.  Neurological: She is alert.  Skin: Skin is warm and dry.     Assessment/Plan Heme positive stool. Diagnostic colonoscopy.  Hildred Laser, MD 11/18/2017, 1:41 PM

## 2017-11-18 NOTE — Discharge Instructions (Signed)
Resume Xarelto on 11/19/2017. Resume other medications as before. High-fiber diet. No driving for 24 hours. Physician will call with biopsy results.  Colonoscopy, Adult, Care After This sheet gives you information about how to care for yourself after your procedure. Your health care provider may also give you more specific instructions. If you have problems or questions, contact your health care provider. What can I expect after the procedure? After the procedure, it is common to have:  A small amount of blood in your stool for 24 hours after the procedure.  Some gas.  Mild abdominal cramping or bloating.  Follow these instructions at home: General instructions   For the first 24 hours after the procedure: ? Do not drive or use machinery. ? Do not sign important documents. ? Do not drink alcohol. ? Do your regular daily activities at a slower pace than normal. ? Eat soft, easy-to-digest foods. ? Rest often.  Take over-the-counter or prescription medicines only as told by your health care provider.  It is up to you to get the results of your procedure. Ask your health care provider, or the department performing the procedure, when your results will be ready. Relieving cramping and bloating  Try walking around when you have cramps or feel bloated.  Apply heat to your abdomen as told by your health care provider. Use a heat source that your health care provider recommends, such as a moist heat pack or a heating pad. ? Place a towel between your skin and the heat source. ? Leave the heat on for 20-30 minutes. ? Remove the heat if your skin turns bright red. This is especially important if you are unable to feel pain, heat, or cold. You may have a greater risk of getting burned. Eating and drinking  Drink enough fluid to keep your urine clear or pale yellow.  Resume your normal diet as instructed by your health care provider. Avoid heavy or fried foods that are hard to  digest.  Avoid drinking alcohol for as long as instructed by your health care provider. Contact a health care provider if:  You have blood in your stool 2-3 days after the procedure. Get help right away if:  You have more than a small spotting of blood in your stool.  You pass large blood clots in your stool.  Your abdomen is swollen.  You have nausea or vomiting.  You have a fever.  You have increasing abdominal pain that is not relieved with medicine. This information is not intended to replace advice given to you by your health care provider. Make sure you discuss any questions you have with your health care provider. Document Released: 01/07/2004 Document Revised: 02/17/2016 Document Reviewed: 08/06/2015 Elsevier Interactive Patient Education  2018 [place].  High-Fiber Diet Fiber, also called dietary fiber, is a type of carbohydrate found in fruits, vegetables, whole grains, and beans. A high-fiber diet can have many health benefits. Your health care provider may recommend a high-fiber diet to help:  Prevent constipation. Fiber can make your bowel movements more regular.  Lower your cholesterol.  Relieve hemorrhoids, uncomplicated diverticulosis, or irritable bowel syndrome.  Prevent overeating as part of a weight-loss plan.  Prevent heart disease, type 2 diabetes, and certain cancers.  What is my plan? The recommended daily intake of fiber includes:  38 grams for men under age 50.  57 grams for men over age 70.  9 grams for women under age 24.  51 grams for women over age 16.  You can get the recommended daily intake of dietary fiber by eating a variety of fruits, vegetables, grains, and beans. Your health care provider may also recommend a fiber supplement if it is not possible to get enough fiber through your diet. What do I need to know about a high-fiber diet?  Fiber supplements have not been widely studied for their effectiveness, so it is better to  get fiber through food sources.  Always check the fiber content on thenutrition facts label of any prepackaged food. Look for foods that contain at least 5 grams of fiber per serving.  Ask your dietitian if you have questions about specific foods that are related to your condition, especially if those foods are not listed in the following section.  Increase your daily fiber consumption gradually. Increasing your intake of dietary fiber too quickly may cause bloating, cramping, or gas.  Drink plenty of water. Water helps you to digest fiber. What foods can I eat? Grains Whole-grain breads. Multigrain cereal. Oats and oatmeal. Brown rice. Barley. Bulgur wheat. [place]. Bran muffins. Popcorn. Rye wafer crackers. Vegetables Sweet potatoes. Spinach. Kale. Artichokes. Cabbage. Broccoli. Green peas. Carrots. Squash. Fruits Berries. Pears. Apples. Oranges. Avocados. Prunes and raisins. Dried figs. Meats and Other Protein Sources Navy, kidney, pinto, and soy beans. Split peas. Lentils. Nuts and seeds. Dairy Fiber-fortified yogurt. Beverages Fiber-fortified soy milk. Fiber-fortified orange juice. Other Fiber bars. The items listed above may not be a complete list of recommended foods or beverages. Contact your dietitian for more options. What foods are not recommended? Grains White bread. Pasta made with refined flour. White rice. Vegetables Fried potatoes. Canned vegetables. Well-cooked vegetables. Fruits Fruit juice. Cooked, strained fruit. Meats and Other Protein Sources Fatty cuts of meat. Fried Sales executive or fried fish. Dairy Milk. Yogurt. Cream cheese. Sour cream. Beverages Soft drinks. Other Cakes and pastries. Butter and oils. The items listed above may not be a complete list of foods and beverages to avoid. Contact your dietitian for more information. What are some tips for including high-fiber foods in my diet?  Eat a wide variety of high-fiber foods.  Make sure that half of  all grains consumed each day are whole grains.  Replace breads and cereals made from refined flour or white flour with whole-grain breads and cereals.  Replace white rice with brown rice, bulgur wheat, or millet.  Start the day with a breakfast that is high in fiber, such as a cereal that contains at least 5 grams of fiber per serving.  Use beans in place of meat in soups, salads, or pasta.  Eat high-fiber snacks, such as berries, raw vegetables, nuts, or popcorn. This information is not intended to replace advice given to you by your health care provider. Make sure you discuss any questions you have with your health care provider. Document Released: 05/25/2005 Document Revised: 10/31/2015 Document Reviewed: 11/07/2013 Elsevier Interactive Patient Education  [employer].

## 2017-11-25 ENCOUNTER — Encounter (HOSPITAL_COMMUNITY): Payer: Self-pay | Admitting: Internal Medicine

## 2018-01-05 ENCOUNTER — Telehealth: Payer: Self-pay | Admitting: Cardiology

## 2018-01-05 NOTE — Telephone Encounter (Signed)
Patient called requesting samples of  Xarelto 20 mg.

## 2018-01-05 NOTE — Telephone Encounter (Signed)
Patient coming to get samples in the morning.

## 2018-01-06 ENCOUNTER — Encounter: Payer: Self-pay | Admitting: Cardiology

## 2018-02-11 ENCOUNTER — Ambulatory Visit (INDEPENDENT_AMBULATORY_CARE_PROVIDER_SITE_OTHER)
Admission: RE | Admit: 2018-02-11 | Discharge: 2018-02-11 | Disposition: A | Discharge status: Home / Self Care | Payer: Medicare Other | Source: Ambulatory Visit | Attending: Internal Medicine | Admitting: Internal Medicine

## 2018-02-11 ENCOUNTER — Ambulatory Visit: Payer: Medicare Other | Admitting: Internal Medicine

## 2018-02-11 ENCOUNTER — Encounter: Payer: Self-pay | Admitting: Internal Medicine

## 2018-02-11 VITALS — BP 126/74 | HR 80 | Ht 61.25 in | Wt 100.0 lb

## 2018-02-11 DIAGNOSIS — J479 Bronchiectasis, uncomplicated: Secondary | ICD-10-CM

## 2018-02-11 NOTE — Patient Instructions (Signed)
Bronchiectasis =   you have scarring of your bronchial tubes which means that they don't function perfectly normally and mucus tends to pool in certain areas of your lung which can cause pneumonia and further scarring of your lung and bronchial tubes  Whenever you develop cough congestion take mucinex or mucinex dm up to 1200 mg every 12 hours as needed  > these will help keep the mucus loose and flowing but if your condition worsens you need to seek help immediately preferably here or somewhere inside the [facility] system to compare xrays ( worse = darker or bloody mucus or pain on breathing in)     Please schedule a follow up office visit in 6 months , call sooner if needed

## 2018-02-11 NOTE — Progress Notes (Signed)
Subjective:     Patient ID: Shelly Fields, female   DOB: Aug 27, 1934,    MRN: [id number]    Brief patient profile:  64 yowf never smoked started with  Chronic cough  Developed 12/2015 mostly with bending over with mostly yellowish with steaks of blood while xarelto but for the most part no blood and w/u bronchiolectasis with  ? MAI in [place] so referred to pulmonary clinic 03/22/2017 by Nicanor Bake     History of Present Illness  03/22/2017 1st [facility] office visit/ Shelly Fields   Chief Complaint  Patient presents with  . Pulmonary Consult    Referred by Dr. Woody Seller for eval of [facility]. Pt c/o occ dry cough.   most days x 15 months only "spits up" mucus (clears throat "I don't cough" )  in afternoons < tbsp yellowish thick mucus  Not limited by breathing from desired activities   Assoc with voice fatigue rec Pantoprazole (protonix) 40 mg   Take  30-60 min before first meal of the day and Pepcid (famotidine)  20 mg one @  bedtime until return to office - this is the best way to tell whether stomach acid is contributing to your problem.   GERD (REFLUX)  Diet    05/13/2017  f/u ov/Shelly Fields re:   MAI/ cough has resolved on GERD rx  Not limited by breathing from desired activities   No am excess mucus rec No change in your medications until the pantaprazole (protonix)  expires (all the refills)  Then stop it Continue the diet and the pepcid (famotidine) at bedtime and when you run out of the pantaprazole refills then take the pepcid after bfast also    08/11/2017  f/u ov/Shelly Fields re: bronchiolectasis, no worse symptoms off ppi/h2  Chief Complaint  Patient presents with  . Follow-up    Cxr done today. Cough is overall improved. She has occ cough with yellow sputum.    Dyspnea: Not limited by breathing from desired activities   Cough: minimal light yellow/ never bloody/ most notable in am  Sleep: ok s am flare of cough  SABA use:  NONE  rec For cough mucinex 600 mg up to 2 every 12 hours  iff  the mucus turns nasty or bloody please call       02/11/2018  f/u ov/Shelly Fields re:  bronchiolectasis? mai  Chief Complaint  Patient presents with  . Follow-up    She only has occ cough- yellow sputum.    Dyspnea:  MMRC1 = can walk nl pace, flat grade, can't hurry or go uphills or steps s sob   Cough: sometimes supine / does not keep her up or flare in am's Sleeping: elevated and rotated R side one pillow  SABA use: none 02: none       No obvious day to day or daytime variability or assoc   mucus plugs or hemoptysis or cp or chest tightness, subjective wheeze or overt sinus or hb symptoms.   Sleeping as above  without nocturnal  or early am exacerbation  of respiratory  c/o's or need for noct saba. Also denies any obvious fluctuation of symptoms with weather or environmental changes or other aggravating or alleviating factors except as outlined above   No unusual exposure hx or h/o childhood pna/ asthma or knowledge of premature birth.  Current Allergies, Complete Past Medical History, Past Surgical History, Family History, and Social History were reviewed in [employer] record.  ROS  The following are not  active complaints unless bolded Hoarseness, sore throat, dysphagia, dental problems, itching, sneezing,  nasal congestion or discharge of excess mucus or purulent secretions, ear ache,   fever, chills, sweats, unintended wt loss or wt gain, classically pleuritic or exertional cp,  orthopnea pnd or arm/hand swelling  or leg swelling, presyncope, palpitations, abdominal pain, anorexia, nausea, vomiting, diarrhea  or change in bowel habits or change in bladder habits, change in stools or change in urine, dysuria, hematuria,  rash, arthralgias, visual complaints, headache, numbness, weakness or ataxia or problems with walking or coordination,  change in mood or  memory.        Current Meds  Medication Sig  . CALCIUM GLUCONATE PO Take 2 tablets by mouth 2 (two) times daily.   . Carboxymethylcellulose Sodium (THERATEARS OP) Place 1 drop into both eyes 2 (two) times daily.  Marland Kitchen docusate sodium (COLACE) 100 MG capsule Take 100 mg by mouth at bedtime.   Marland Kitchen levothyroxine (SYNTHROID, LEVOTHROID) 50 MCG tablet Take 50 mcg by mouth every other day.  . loratadine (CLARITIN) 10 MG tablet Take 10 mg by mouth daily as needed for allergies.  . metoprolol succinate (TOPROL-XL) 50 MG 24 hr tablet Take 50 mg by mouth at lunch and take 25 mg by mouth at bedtime  . Multiple Vitamins-Minerals (CENTRUM SILVER 50+WOMEN PO) Take 1 tablet by mouth daily.  . rivaroxaban (XARELTO) 20 MG TABS tablet Take 1 tablet (20 mg total) by mouth daily.  Marland Kitchen senna (SENOKOT) 8.6 MG tablet Take 1 tablet by mouth See admin instructions. Take 1 tablet by mouth every other night                 Objective:   Physical Exam  amb wf nad     02/11/2018          100  08/11/2017         102   05/13/2017      102   03/22/17 101 lb (45.8 kg)  03/18/17 101 lb (45.8 kg)  09/15/16 97 lb 12.8 oz (44.4 kg)       Vital signs reviewed - Note on arrival 02 sats  100% on RA       HEENT: nl dentition, turbinates bilaterally, and oropharynx. Nl external ear canals without cough reflex   NECK :  without JVD/Nodes/TM/ nl carotid upstrokes bilaterally   LUNGS: no acc muscle use,  Nl contour chest  With a few scattered insp pops/ squeaks bilaterally without cough on insp or exp maneuvers   CV:  slt IRIR   no s3 or murmur or increase in P2, and no edema   ABD:  soft and nontender with nl inspiratory excursion in the supine position. No bruits or organomegaly appreciated, bowel sounds nl  MS:  Nl gait/ ext warm without deformities, calf tenderness, cyanosis or clubbing No obvious joint restrictions   SKIN: warm and dry without lesions    NEURO:  alert, approp, nl sensorium with  no motor or cerebellar deficits apparent.        CXR PA and Lateral:   02/11/2018 :    I personally reviewed images and agree with  radiology impression as follows:    Emphysematous changes and scattered fibrosis in the lungs. No evidence of active pulmonary disease.         Assessment:

## 2018-02-13 ENCOUNTER — Encounter: Payer: Self-pay | Admitting: Internal Medicine

## 2018-02-13 NOTE — Assessment & Plan Note (Signed)
Chest CT  01/06/17  C/w MAI with "bronciolectasis" L > R bases / no real change since  02/11/16  but new findings c/w PH - PFT's  05/13/2017  FEV1 1.54 (85 % ) ratio 64   p nothing prior to study with DLCO  59/63c % corrects to 103 % for alv volume    Relatively well compensated with no further hemoptysis even on maint rx with xarelto  I had an extended discussion with the patient reviewing all relevant studies completed to date and  lasting 15 to 20 minutes of a 25 minute visit on the following ongoing concerns:   Reviewed pathophysilogy of bronchiectasis/ mucociliary dysfunction emphasizing the chronic nature and limited management options   Each maintenance medication was reviewed in detail including most importantly the difference between maintenance and as needed and under what circumstances the prns are to be used.  Please see AVS for specific  Instructions which are unique to this visit and I personally typed out  which were reviewed in detail in writing with the patient and a copy provided.

## 2018-02-14 NOTE — Progress Notes (Signed)
Spoke with pt and notified of results per Dr. Wert. Pt verbalized understanding and denied any questions. 

## 2018-04-29 NOTE — Progress Notes (Signed)
Cardiology Office Note  Date: 05/02/2018   Shelly Fields, DOB 05-18-1935, MRN [id number]  Shelly Fields  Shelly Cardiologist: Rozann Lesches, Shelly Fields   Chief Complaint  Patient presents with  . Atrial Fibrillation    History of Present Illness: Shelly Fields Shelly an 83 y.o. Fields last seen in May.  She Shelly here for a routine visit.  States that she has intermittent palpitations, stable dyspnea on exertion.  She has had no exertional chest pain.  I reviewed interval lab work from August as outlined below.  She does not report any spontaneous bleeding problems on Xarelto.  I personally reviewed her ECG today which showed atrial fibrillation and 83 bpm, rightward axis and R prime in lead V1 and V2.  Her heart rate at rest Shelly in the 80s to 90s, increases fairly quickly when she gets up and moves around the room.  We discussed increasing her Toprol-XL somewhat more.  She has had no dizziness or syncope.  Past Medical History:  Diagnosis Date  . Diverticulosis   . Essential hypertension, benign   . Hypothyroidism   . Internal hemorrhoids   . Paroxysmal atrial fibrillation (HCC)   . Seasonal allergies   . Shingles     Past Surgical History:  Procedure Laterality Date  . ABDOMINAL HYSTERECTOMY    . CATARACT EXTRACTION    . COLONOSCOPY N/A 01/12/2013   Procedure: COLONOSCOPY;  Surgeon: Rogene Houston, Shelly Fields;  [facility];  Service: Endoscopy;  Laterality: N/A;  1030  . COLONOSCOPY N/A 11/18/2017   Procedure: COLONOSCOPY;  Surgeon: Rogene Houston, Shelly Fields;  [facility];  Service: Endoscopy;  Laterality: N/A;  . POLYPECTOMY  11/18/2017   Procedure: POLYPECTOMY;  Surgeon: Rogene Houston, Shelly Fields;  [facility];  Service: Endoscopy;;  colon    Current Outpatient Medications  Medication Sig Dispense Refill  . Carboxymethylcellulose Sodium (THERATEARS OP) Place 1 drop into both eyes 2 (two) times daily.    Marland Kitchen denosumab (PROLIA) 60 MG/ML SOSY  injection Inject 60 mg into the skin every 6 (six) months.    . docusate sodium (COLACE) 100 MG capsule Take 100 mg by mouth at bedtime.     Marland Kitchen levothyroxine (SYNTHROID, LEVOTHROID) 50 MCG tablet Take 50 mcg by mouth every other day.    . loratadine (CLARITIN) 10 MG tablet Take 10 mg by mouth daily as needed for allergies.    . metoprolol succinate (TOPROL-XL) 50 MG 24 hr tablet Take 1 tablet (50 mg total) by mouth 2 (two) times daily. 180 tablet 2  . Multiple Vitamins-Minerals (CENTRUM SILVER 50+WOMEN PO) Take 1 tablet by mouth daily.    . rivaroxaban (XARELTO) 20 MG TABS tablet Take 1 tablet (20 mg total) by mouth daily. 21 tablet 0  . senna (SENOKOT) 8.6 MG tablet Take 1 tablet by mouth See admin instructions. Take 1 tablet by mouth every other night     No current facility-administered medications for this visit.    Allergies:  Patient has no known allergies.   Social History: The patient  reports that she has never smoked. She has never used smokeless tobacco. She reports that she does not drink alcohol or use drugs.   ROS:  Please see the history of present illness. Otherwise, complete review of systems Shelly positive for hearing loss.  All other systems are reviewed and negative.   Physical Exam: VS:  BP (!) 150/98   Pulse 83  Ht 5\' 2"  (1.575 m)   Wt 101 lb (45.8 kg)   SpO2 98%   BMI 18.47 kg/m , BMI Body mass index Shelly 18.47 kg/m.  Wt Readings from Last 3 Encounters:  05/02/18 101 lb (45.8 kg)  02/11/18 100 lb (45.4 kg)  11/18/17 101 lb (45.8 kg)    General: Thin elderly woman, appears comfortable at rest. HEENT: Conjunctiva and lids normal, oropharynx clear. Neck: Supple, no elevated JVP or carotid bruits, no thyromegaly. Lungs: Clear to auscultation, nonlabored breathing at rest. Cardiac: Irregularly irregular, no S3, soft apical systolic murmur. Abdomen: Soft, nontender, bowel sounds present. Extremities: Trace ankle edema, distal pulses 2+. Skin: Warm and  dry. Musculoskeletal: No kyphosis. Neuropsychiatric: Alert and oriented x3, affect grossly appropriate.  ECG: I personally reviewed the tracing from 03/22/2017 which showed atrial fibrillation with R' in lead V1 and V2.  Recent Labwork:  August 2019: Hemoglobin 13.7, platelets 288, BUN 11, creatinine 0.8, potassium 4.8, AST 26, ALT 20, cholesterol 133, triglycerides 71, HDL 55, LDL 64, TSH 1.77  Other Studies Reviewed Today:  Echocardiogram 09/08/2017: Study Conclusions  - Left ventricle: The cavity size was normal. Wall thickness was normal. Systolic function was normal. The estimated ejection fraction was in the range of 60% to 65%. Wall motion was normal; there were no regional wall motion abnormalities. The study was not technically sufficient to allow evaluation of LV diastolic dysfunction due to atrial fibrillation. - Aortic valve: There was mild regurgitation. - Mitral valve: There was mild to moderate regurgitation. - Left atrium: The atrium was severely dilated. - Right atrium: The atrium was massively dilated. - Tricuspid valve: There was severe regurgitation. - Systemic veins: IVC Shelly dilated with normal respiratory variation. Estimated CVP 8 mmHg.  Assessment and Plan:  1.  Permanent atrial fibrillation, CHADSVASC score Shelly 5.  Continue strategy of heart rate control and anticoagulation.  Increase Toprol-XL to 50 mg twice daily.  2.  Essential hypertension, blood pressure elevated today, but all of her home blood pressure checks have been quite good.  No changes to current regimen.  3.  Hypothyroidism, continues on Synthroid with follow-up per PCP.  4.  Severe tricuspid regurgitation, stable by echocardiogram in April with normal PASP.  Current medicines were reviewed with the patient today.   Orders Placed This Encounter  Procedures  . EKG 12-Lead    Disposition: Follow-up in 6 months.  Signed, Satira Sark, Shelly Fields, [facility] 05/02/2018 12:07 PM     [facility] [place], [place], [place] [address] Phone: [phone]; Fax: [phone]

## 2018-05-02 ENCOUNTER — Ambulatory Visit: Payer: Medicare Other | Admitting: Cardiology

## 2018-05-02 ENCOUNTER — Encounter: Payer: Self-pay | Admitting: Cardiology

## 2018-05-02 VITALS — BP 150/98 | HR 83 | Ht 62.0 in | Wt 101.0 lb

## 2018-05-02 DIAGNOSIS — I4821 Permanent atrial fibrillation: Secondary | ICD-10-CM

## 2018-05-02 DIAGNOSIS — E039 Hypothyroidism, unspecified: Secondary | ICD-10-CM

## 2018-05-02 DIAGNOSIS — I361 Nonrheumatic tricuspid (valve) insufficiency: Secondary | ICD-10-CM | POA: Diagnosis not present

## 2018-05-02 DIAGNOSIS — I10 Essential (primary) hypertension: Secondary | ICD-10-CM | POA: Diagnosis not present

## 2018-05-02 MED ORDER — RIVAROXABAN 20 MG PO TABS: 20.0000 mg | ORAL_TABLET | Freq: Every day | ORAL | 0 refills | Status: DC

## 2018-05-02 MED ORDER — METOPROLOL SUCCINATE ER 50 MG PO TB24: 50.0000 mg | ORAL_TABLET | Freq: Two times a day (BID) | ORAL | 2 refills | Status: DC

## 2018-05-02 NOTE — Patient Instructions (Addendum)
Medication Instructions:   Your physician has recommended you make the following change in your medication:   Increase your metoprolol succinate (toprol xl) to 50 mg by mouth twice daily.  Continue all other medications the same.  Labwork:  NONE  Testing/Procedures:  NONE  Follow-Up:  Your physician recommends that you schedule a follow-up appointment in: 6 months. You will receive a reminder letter in the mail in about 4 months reminding you to call and schedule your appointment. If you don't receive this letter, please contact our office.  Any Other Special Instructions Will Be Listed Below (If Applicable).  If you need a refill on your cardiac medications before your next appointment, please call your pharmacy.

## 2018-06-11 ENCOUNTER — Other Ambulatory Visit: Payer: Self-pay | Admitting: Cardiology

## 2018-06-23 ENCOUNTER — Ambulatory Visit (INDEPENDENT_AMBULATORY_CARE_PROVIDER_SITE_OTHER): Payer: Medicare Other | Admitting: Otolaryngology

## 2018-06-23 DIAGNOSIS — H903 Sensorineural hearing loss, bilateral: Secondary | ICD-10-CM

## 2018-08-16 ENCOUNTER — Ambulatory Visit: Payer: Medicare Other | Admitting: Internal Medicine

## 2018-08-25 ENCOUNTER — Encounter: Payer: Self-pay | Admitting: Cardiovascular Disease

## 2018-08-29 ENCOUNTER — Telehealth: Payer: Self-pay | Admitting: Physician Assistant

## 2018-08-29 NOTE — Telephone Encounter (Signed)
I left a message for the patient to call back concerning her upcoming appointment with Dr. Domenic Polite 09/01/2018 due to the coronavirus.  After reviewing her chart she did have a recent hospitalization at [facility] and may need to be seen.  Please do the proper screening when she calls back.

## 2018-08-29 NOTE — Telephone Encounter (Signed)
Spoke with patient concerning her upcoming appointment with Dr. Domenic Polite 09/01/2018.  Patient had recent hospitalization for heart failure and is still having trouble with swelling and would like to be seen.  Please keep this appointment for her at 11:00 am    Primary Cardiologist: Rozann Lesches, MD   Pt contacted.  History and symptoms reviewed.  Pt will f/u with [facility] provider as scheduled.  Pt. advised that we are restricting visitors at this time and request that only patients present for check-in prior to their appointment.  All other visitors should remain in their car.  If necessary, only one visitor may come with the patient, into the building. For everyone's safety, all patients and visitor entering our practice area should expect to be screened again prior to entering our waiting area.  Ermalinda Barrios, PA-C  08/29/2018 10:31 AM

## 2018-09-01 ENCOUNTER — Encounter: Payer: Self-pay | Admitting: Cardiology

## 2018-09-01 ENCOUNTER — Telehealth: Payer: Self-pay

## 2018-09-01 ENCOUNTER — Ambulatory Visit (INDEPENDENT_AMBULATORY_CARE_PROVIDER_SITE_OTHER): Payer: Medicare Other | Admitting: Cardiology

## 2018-09-01 ENCOUNTER — Other Ambulatory Visit: Payer: Self-pay

## 2018-09-01 VITALS — BP 140/94 | HR 108 | Ht 62.0 in | Wt 112.0 lb

## 2018-09-01 DIAGNOSIS — I5033 Acute on chronic diastolic (congestive) heart failure: Secondary | ICD-10-CM | POA: Diagnosis not present

## 2018-09-01 DIAGNOSIS — I361 Nonrheumatic tricuspid (valve) insufficiency: Secondary | ICD-10-CM

## 2018-09-01 DIAGNOSIS — I4819 Other persistent atrial fibrillation: Secondary | ICD-10-CM

## 2018-09-01 DIAGNOSIS — E871 Hypo-osmolality and hyponatremia: Secondary | ICD-10-CM | POA: Diagnosis not present

## 2018-09-01 DIAGNOSIS — E039 Hypothyroidism, unspecified: Secondary | ICD-10-CM

## 2018-09-01 MED ORDER — FUROSEMIDE 20 MG PO TABS: 40.0000 mg | ORAL_TABLET | Freq: Every day | ORAL | 3 refills | Status: DC

## 2018-09-01 MED ORDER — METOPROLOL SUCCINATE ER 25 MG PO TB24: 25.0000 mg | ORAL_TABLET | Freq: Every day | ORAL | 3 refills | Status: DC

## 2018-09-01 MED ORDER — POTASSIUM CHLORIDE ER 10 MEQ PO TBCR: 10.0000 meq | EXTENDED_RELEASE_TABLET | Freq: Every day | ORAL | 3 refills | Status: DC

## 2018-09-01 MED ORDER — METOPROLOL SUCCINATE ER 50 MG PO TB24: 50.0000 mg | ORAL_TABLET | Freq: Two times a day (BID) | ORAL | 2 refills | Status: DC

## 2018-09-01 NOTE — Telephone Encounter (Signed)
error 

## 2018-09-01 NOTE — Patient Instructions (Addendum)
Medication Instructions:  INCREASE Toprol XL to 75 mg BID  Take your 50 mg tablet AND your 25 mg tablet TWICE a day    INCREASE Lasix to 40 mg daily   START Potassium 10 meq daily  If you need a refill on your cardiac medications before your next appointment, please call your pharmacy.   Lab work: None If you have labs (blood work) drawn today and your tests are completely normal, you will receive your results only by: Marland Kitchen MyChart Message (if you have MyChart) OR . A paper copy in the mail If you have any lab test that is abnormal or we need to change your treatment, we will call you to review the results.  Testing/Procedures: None  Follow-Up: At [facility], you and your health needs are our priority.  As part of our continuing mission to provide you with exceptional heart care, we have created designated Provider Care Teams.  These Care Teams include your primary Cardiologist (physician) and Advanced Practice Providers (APPs -  Physician Assistants and Nurse Practitioners) who all work together to provide you with the care you need, when you need it. You will need a follow up appointment in 2 weeks Telephone visit.

## 2018-09-01 NOTE — Progress Notes (Signed)
Cardiology Office Note  Date: 09/01/2018   ID: Lori, Popowski 1934/08/02, MRN [id number]  PCP: Berenice Primas  Primary Cardiologist: Rozann Lesches, MD   Chief Complaint  Patient presents with  . Hospitalization Follow-up    History of Present Illness: Shelly Fields is an 84 y.o. female last seen in November 2019.  She is here for a post hospital visit, was admitted to [facility] earlier in the month.  I reviewed available records which indicate presentation with shortness of breath and generalized edema, found to be hyponatremic and also in persistent atrial fibrillation with RVR.  She was seen by the [facility] cardiologist with echocardiogram reporting LVEF 60 to 65%, severely dilated right ventricle with moderate decreased contraction, severe tricuspid regurgitation, PASP estimated greater than 45 mmHg, severe right atrial enlargement.  She was treated with IV diuresis and also heart rate control measures.  She is here today with her son for follow-up.  Weight is about 11 pounds over baseline, she states that she did feel better after IV diuresis but that her weight has been gradually increasing since then despite Lasix 20 mg daily which was started at discharge.  She had follow-up lab work with PCP yesterday, BUN 11, creatinine 0.74, sodium 131, potassium 4.3.  She remains in atrial fibrillation, heart rate 100-110.  I suspect that the precipitant for her heart failure symptoms was persistent atrial fibrillation with uncontrolled heart rate.  She has had severe tricuspid regurgitation by prior evaluation.  She reports compliance with Xarelto.  Past Medical History:  Diagnosis Date  . Diverticulosis   . Essential hypertension, benign   . Hypothyroidism   . Internal hemorrhoids   . Paroxysmal atrial fibrillation (HCC)   . Seasonal allergies   . Shingles     Past Surgical History:  Procedure Laterality Date  . ABDOMINAL HYSTERECTOMY    . CATARACT  EXTRACTION    . COLONOSCOPY N/A 01/12/2013   Procedure: COLONOSCOPY;  Surgeon: Rogene Houston, MD;  [facility];  Service: Endoscopy;  Laterality: N/A;  1030  . COLONOSCOPY N/A 11/18/2017   Procedure: COLONOSCOPY;  Surgeon: Rogene Houston, MD;  [facility];  Service: Endoscopy;  Laterality: N/A;  . POLYPECTOMY  11/18/2017   Procedure: POLYPECTOMY;  Surgeon: Rogene Houston, MD;  [facility];  Service: Endoscopy;;  colon    Current Outpatient Medications  Medication Sig Dispense Refill  . Carboxymethylcellulose Sodium (THERATEARS OP) Place 1 drop into both eyes 2 (two) times daily.    Marland Kitchen denosumab (PROLIA) 60 MG/ML SOSY injection Inject 60 mg into the skin every 6 (six) months.    . docusate sodium (COLACE) 100 MG capsule Take 100 mg by mouth at bedtime.     . furosemide (LASIX) 20 MG tablet Take 20 mg by mouth daily.    Marland Kitchen levothyroxine (SYNTHROID, LEVOTHROID) 50 MCG tablet Take 50 mcg by mouth every other day.    . metoprolol succinate (TOPROL-XL) 50 MG 24 hr tablet Take 1 tablet (50 mg total) by mouth 2 (two) times daily. 180 tablet 2  . Multiple Vitamins-Minerals (CENTRUM SILVER 50+WOMEN PO) Take 1 tablet by mouth daily.    Marland Kitchen senna (SENOKOT) 8.6 MG tablet Take 1 tablet by mouth See admin instructions. Take 1 tablet by mouth every other night    . XARELTO 20 MG TABS tablet TAKE 1 TABLET BY MOUTH  DAILY 90 tablet 3  . metoprolol succinate (TOPROL XL) 25  MG 24 hr tablet Take 1 tablet (25 mg total) by mouth daily. 180 tablet 3   No current facility-administered medications for this visit.    Allergies:  Patient has no known allergies.   Social History: The patient  reports that she has never smoked. She has never used smokeless tobacco. She reports that she does not drink alcohol or use drugs.   Family History: The patient's family history includes Heart disease in her mother; Hypertension in her father.   ROS:  Please see the history of present illness.  Otherwise, complete review of systems is positive for leg swelling.  All other systems are reviewed and negative.   Physical Exam: VS:  BP (!) 140/94 (BP Location: Right Arm)   Pulse (!) 108   Ht 5\' 2"  (1.575 m)   Wt 112 lb (50.8 kg)   SpO2 94%   BMI 20.49 kg/m , BMI Body mass index is 20.49 kg/m.  Wt Readings from Last 3 Encounters:  09/01/18 112 lb (50.8 kg)  05/02/18 101 lb (45.8 kg)  02/11/18 100 lb (45.4 kg)    Patient had a negative COVID-19 screen today on arrival to the office.  General: Elderly woman in no distress. HEENT: Conjunctiva and lids normal, oropharynx clear. Neck: Supple, elevated JVP, no carotid bruits, no thyromegaly. Lungs: Decreased breath sounds at the bases predominantly, nonlabored breathing at rest. Cardiac: Irregularly irregular, no S3, soft apical systolic murmur, no pericardial rub. Abdomen: Soft, nontender, bowel sounds present, no guarding or rebound. Extremities: 2+ lower leg edema, distal pulses 1-2+. Skin: Warm and dry. Musculoskeletal: Mild kyphosis. Neuropsychiatric: Alert and oriented x3, affect grossly appropriate.  ECG: I personally reviewed the tracing from 08/17/2018 which showed rapid atrial fibrillation at 135 bpm.  Recent Labwork:  March 2020: BUN 10, creatinine 0.55, potassium 3.6, sodium 127 up from 123,, AST 17, ALT 8, magnesium 2.1, troponin T negative, NT-proBNP up to 3122, hemoglobin 11.7, platelets 455, urine culture positive for Enterococcus faecalis  Other Studies Reviewed Today:  Echocardiogram 09/25/2017: Study Conclusions  - Left ventricle: The cavity size was normal. Wall thickness was   normal. Systolic function was normal. The estimated ejection   fraction was in the range of 60% to 65%. Wall motion was normal;   there were no regional wall motion abnormalities. The study was   not technically sufficient to allow evaluation of LV diastolic   dysfunction due to atrial fibrillation. - Aortic valve: There was mild  regurgitation. - Mitral valve: There was mild to moderate regurgitation. - Left atrium: The atrium was severely dilated. - Right atrium: The atrium was massively dilated. - Tricuspid valve: There was severe regurgitation. - Systemic veins: IVC is dilated with normal respiratory variation.   Estimated CVP 8 mmHg.  Chest x-ray 08/17/2018: Right lobe airspace disease and effusion, marked cardiomegaly.  Assessment and Plan:  1.  Acute on chronic diastolic heart failure complicated by persistent atrial fibrillation.  She still has evidence of fluid overload, weight gradually increasing since recent hospital stay on low-dose Lasix.  We are going to double her Lasix to 40 mg daily and add a potassium supplement at 10 mEq daily.  Would also increase Toprol-XL to 75 mg twice daily to try and achieve better heart rate control, continue Coumadin.  We may ultimately need to consider a cardioversion if heart rate control strategy is ineffective, but we had previously tried to manage her with strategy of permanent atrial fibrillation.  2.  Persistent atrial fibrillation, heart rate not  optimally controlled.  As noted above we will increase Toprol-XL to 75 mg twice daily, continue Xarelto.  3.  Hyponatremia complicated by fluid overload.  Sodium has increased from 127 at hospital discharge to 131 by recent lab work.  4.  History of hypothyroidism, she continues on Synthroid with follow-up TSH per PCP.  5.  Severe tricuspid regurgitation by history and recent follow-up echocardiogram.  She has right ventricular dysfunction as well.  She is on anticoagulation for atrial fibrillation making the likelihood of thromboembolic pulmonary disease as etiology unlikely.  Also no substantial degree of mitral regurgitation.  Current medicines were reviewed with the patient today.  Disposition: Anticipate phone visit telehealth encounter in 2 weeks.  Signed, Satira Sark, MD, [facility] 09/01/2018 11:29 AM    [place] [facility] [address], [place], [place] [address] Phone: [phone]; Fax: [phone]

## 2018-09-03 ENCOUNTER — Telehealth: Payer: Self-pay | Admitting: Cardiology

## 2018-09-03 NOTE — Telephone Encounter (Signed)
Called by [facility] noting that patient was seen 3/26 and since then has gained 3lbs and 5lbs up for the week. Recent 2D echo showed normal LVF with EF 60-65% with severe RVE and moderate RV dysfunction and severe TR. The also has afib on Xarelto.   At [place] 3/26 weight was 112lbs (110lbs on patient's scales).  Now up 3lbs since OV to 113.6lbs mild crackles at bases and LE edema.  Apparently on 3/18 she was placed on NaCl tablets for low sodium.    Instructed nurse to stop NaCl tablets and increase Lasix to 40mg  BID for 3 days.  Nurse will come over tomorrow to reassess and call if weight is still up or LE edema or crackles are persistent.  Ordered BMET for Monday.

## 2018-09-05 NOTE — Telephone Encounter (Signed)
Spoke with patient who says the home health nurse has not been to her home yet but is due to see her today. Patient advised to have home health nurse contact our office with an update on her symptoms and assessment findings.

## 2018-09-06 ENCOUNTER — Telehealth: Payer: Self-pay | Admitting: Cardiology

## 2018-09-06 NOTE — Telephone Encounter (Addendum)
Labs were scanned into [employer]

## 2018-09-06 NOTE — Telephone Encounter (Signed)
Janett Billow w/ [facility] wanted to make sure that we got the lab work results and to please give the patient a call back.

## 2018-09-07 NOTE — Telephone Encounter (Signed)
I relayed Dr.McDowell's message to patient

## 2018-09-07 NOTE — Telephone Encounter (Signed)
Patient took BID of Lasix 40 mg for 3 days, now states "it worked", she is doing better.  Her weight was 113.6 lbs, now 107 lbs  (6.6 lbs loss)   BP today 129/81 HR 94   She is elevating legs and wearing compression stockings   She states she was told to stop the sodium tablets and wants to be sure she should not resume them

## 2018-09-07 NOTE — Telephone Encounter (Signed)
I reviewed the recent lab work.  Sodium 128, renal function stable with potassium 4.0.  Need to find out how she did with the Lasix at 40 mg twice daily since the weekend.  Follow-up on weights and leg edema.  I can make adjustments from there.

## 2018-09-07 NOTE — Telephone Encounter (Signed)
I will inform Dr.McDowell of scanned labs

## 2018-09-07 NOTE — Telephone Encounter (Signed)
I am glad she is feeling better.  Let's continue with the current plan of Lasix 40 mg once daily and KCl 10 mEq daily.  Agree with stopping the sodium tablets.

## 2018-09-07 NOTE — Telephone Encounter (Signed)
[id number] 

## 2018-09-07 NOTE — Telephone Encounter (Signed)
Returned call

## 2018-09-14 ENCOUNTER — Telehealth: Payer: Self-pay | Admitting: *Deleted

## 2018-09-15 ENCOUNTER — Telehealth (INDEPENDENT_AMBULATORY_CARE_PROVIDER_SITE_OTHER): Payer: Medicare Other | Admitting: Cardiology

## 2018-09-15 ENCOUNTER — Encounter: Payer: Self-pay | Admitting: Cardiology

## 2018-09-15 VITALS — BP 134/76 | HR 87 | Wt 101.2 lb

## 2018-09-15 DIAGNOSIS — I4819 Other persistent atrial fibrillation: Secondary | ICD-10-CM | POA: Diagnosis not present

## 2018-09-15 DIAGNOSIS — I361 Nonrheumatic tricuspid (valve) insufficiency: Secondary | ICD-10-CM

## 2018-09-15 DIAGNOSIS — Z7901 Long term (current) use of anticoagulants: Secondary | ICD-10-CM

## 2018-09-15 DIAGNOSIS — I5032 Chronic diastolic (congestive) heart failure: Secondary | ICD-10-CM | POA: Diagnosis not present

## 2018-09-15 DIAGNOSIS — Z79899 Other long term (current) drug therapy: Secondary | ICD-10-CM

## 2018-09-15 NOTE — Patient Instructions (Signed)
Medication Instructions:   Your physician recommends that you continue on your current medications as directed. Please refer to the Current Medication list given to you today.  Labwork:  Your physician recommends that you return for lab work in: June 2020 just before your next visit to check your BMET.-Lab order mailed.  Testing/Procedures:  NONE  Follow-Up:  Your physician recommends that you schedule a follow-up appointment in: early June 2020 with Dr. Domenic Polite.  Any Other Special Instructions Will Be Listed Below (If Applicable).  If you need a refill on your cardiac medications before your next appointment, please call your pharmacy.

## 2018-09-15 NOTE — Telephone Encounter (Signed)
Medications and allergies reviewed with patient  Aware to check BP, HR and weight prior to visit today No new lab work or hospitalizations since last visit  The patient verbally consented for a telehealth visit today with [facility] and understands that her insurance company will be billed for the encounter.

## 2018-09-15 NOTE — Progress Notes (Addendum)
Virtual Visit via Video Note   This visit type was conducted due to national recommendations for restrictions regarding the COVID-19 Pandemic (e.g. social distancing) in an effort to limit this patient's exposure and mitigate transmission in our community.  Due to her co-morbid illnesses, this patient is at least at moderate risk for complications without adequate follow up.  This format is felt to be most appropriate for this patient at this time.  All issues noted in this document were discussed and addressed.  A limited physical exam was performed with this format.  Please refer to the patient's chart for her consent to telehealth for [facility].   Evaluation Performed:  Follow-up visit  Date:  09/15/2018   ID:  Shelly, Fields 1934/08/28, MRN [id number]  Patient Location: Home  Provider Location: Office  PCP:  Berenice Primas  Cardiologist:  Rozann Lesches, MD  Chief Complaint:  Follow-up heart failure and atrial fibriillation  History of Present Illness:    Shelly Fields is an 84 y.o. female who presents via audio/video conferencing for a telehealth visit today.  She was seen recently on March 26.  Medications were titrated at that time for improved diuresis and better heart rate control in atrial fibrillation.  She tells me that she is feeling better, improved energy and functional capacity.  She does not report any palpitations or chest pain.  Her weight is down 11 pounds which is at prior baseline.  Lasix is at 40 mg total daily.  She still has home health nursing once a week.  I reviewed her follow-up lab work which is outlined below.  Function has been stable.  The patient does not have symptoms concerning for COVID-19 infection (fever, chills, cough, or new shortness of breath).    Past Medical History:  Diagnosis Date  . Diverticulosis   . Essential hypertension, benign   . Hypothyroidism   . Internal hemorrhoids   . Paroxysmal atrial fibrillation (HCC)    . Seasonal allergies   . Shingles    Past Surgical History:  Procedure Laterality Date  . ABDOMINAL HYSTERECTOMY    . CATARACT EXTRACTION    . COLONOSCOPY N/A 01/12/2013   Procedure: COLONOSCOPY;  Surgeon: Rogene Houston, MD;  [facility];  Service: Endoscopy;  Laterality: N/A;  1030  . COLONOSCOPY N/A 11/18/2017   Procedure: COLONOSCOPY;  Surgeon: Rogene Houston, MD;  [facility];  Service: Endoscopy;  Laterality: N/A;  . POLYPECTOMY  11/18/2017   Procedure: POLYPECTOMY;  Surgeon: Rogene Houston, MD;  [facility];  Service: Endoscopy;;  colon     Current Meds  Medication Sig  . Carboxymethylcellulose Sodium (THERATEARS OP) Place 1 drop into both eyes 2 (two) times daily.  Marland Kitchen denosumab (PROLIA) 60 MG/ML SOSY injection Inject 60 mg into the skin every 6 (six) months.  . docusate sodium (COLACE) 100 MG capsule Take 100 mg by mouth at bedtime.   . furosemide (LASIX) 20 MG tablet Take 2 tablets (40 mg total) by mouth daily.  Marland Kitchen levothyroxine (SYNTHROID, LEVOTHROID) 50 MCG tablet Take 50 mcg by mouth every other day.  . metoprolol succinate (TOPROL-XL) 25 MG 24 hr tablet Take 25 mg by mouth 2 (two) times daily.  . metoprolol succinate (TOPROL-XL) 50 MG 24 hr tablet Take 1 tablet (50 mg total) by mouth 2 (two) times daily.  . Multiple Vitamins-Minerals (CENTRUM SILVER 50+WOMEN PO) Take 1 tablet by mouth daily.  . potassium chloride (K-DUR)  10 MEQ tablet Take 1 tablet (10 mEq total) by mouth daily.  Marland Kitchen senna (SENOKOT) 8.6 MG tablet Take 1 tablet by mouth See admin instructions. Take 1 tablet by mouth every other night  . XARELTO 20 MG TABS tablet TAKE 1 TABLET BY MOUTH  DAILY     Allergies:   Patient has no known allergies.   Social History   Tobacco Use  . Smoking status: Never Smoker  . Smokeless tobacco: Never Used  Substance Use Topics  . Alcohol use: No    Alcohol/week: 0.0 standard drinks  . Drug use: No     Family Hx: The patient's  family history includes Heart disease in her mother; Hypertension in her father. There is no history of Colon cancer.  ROS:   Please see the history of present illness.    Hearing loss. All other systems reviewed and are negative.   Prior CV studies:   The following studies were reviewed today:  Echocardiogram 09/25/2017: Study Conclusions  - Left ventricle: The cavity size was normal. Wall thickness was normal. Systolic function was normal. The estimated ejection fraction was in the range of 60% to 65%. Wall motion was normal; there were no regional wall motion abnormalities. The study was not technically sufficient to allow evaluation of LV diastolic dysfunction due to atrial fibrillation. - Aortic valve: There was mild regurgitation. - Mitral valve: There was mild to moderate regurgitation. - Left atrium: The atrium was severely dilated. - Right atrium: The atrium was massively dilated. - Tricuspid valve: There was severe regurgitation. - Systemic veins: IVC is dilated with normal respiratory variation. Estimated CVP 8 mmHg.  Labs/Other Tests and Data Reviewed:    EKG:   08/17/2018 which showed rapid atrial fibrillation at 135 bpm.  Recent Labs:  September 05, 2018: BUN 14, creatinine 0.82, potassium 4.0, sodium 128  Wt Readings from Last 3 Encounters:  09/15/18 101 lb 3.2 oz (45.9 kg)  09/01/18 112 lb (50.8 kg)  05/02/18 101 lb (45.8 kg)     Objective:    Vital Signs:  BP 134/76   Pulse 87   Wt 101 lb 3.2 oz (45.9 kg)   BMI 18.51 kg/m    Patient appears comfortable at rest, speaking to me from within her home. She is hard of hearing, but we were able to communicate with the volume turned up. Skin color pale. She speaks in full sentences spontaneously without shortness of breath at rest. Affect is appropriate.   ASSESSMENT & PLAN:    1.  Chronic diastolic heart failure, symptomatically improved with 11 pound weight loss and at prior baseline.  She  will continue checking weight daily, no change to current Lasix dose or potassium supplement.  Follow-up BMET for next visit.  2.  Persistent atrial fibrillation.  Heart rate is also better controlled, in the 80s today and consistent with recent home vital sign checks per home health nursing.  Plan to continue Toprol-XL and heart rate control strategy rather than pursuing cardioversion attempt.  Continue Xarelto for stroke prophylaxis.  3.  Severe tricuspid regurgitation with right ventricular dysfunction.  Recent echocardiogram noted above.  COVID-19 Education: The signs and symptoms of COVID-19 were discussed with the patient and how to seek care for testing (follow up with PCP or arrange E-visit).  The importance of social distancing was discussed today.  Time:   Today, I have spent 15 minutes with the patient with telehealth technology discussing the above problems.     Medication Adjustments/Labs  and Tests Ordered: Current medicines are reviewed at length with the patient today.  Concerns regarding medicines are outlined above.  Tests Ordered: Orders Placed This Encounter  Procedures  . Basic metabolic panel   Medication Changes: No orders of the defined types were placed in this encounter.   Disposition:  Follow up early June as scheduled.  Signed, Rozann Lesches, MD  09/15/2018 2:57 PM    [facility]

## 2018-09-19 ENCOUNTER — Telehealth: Payer: Self-pay | Admitting: Cardiology

## 2018-09-19 MED ORDER — FUROSEMIDE 20 MG PO TABS: 20.0000 mg | ORAL_TABLET | Freq: Every day | ORAL | 3 refills | Status: DC

## 2018-09-19 NOTE — Telephone Encounter (Signed)
Yes, change Lasix to 20 mg daily and continue KCl 10 mEq daily.  If weight goes up 2 to 3 pounds in 24 hours or 5 pounds in a week, she should increase Lasix back to 40 mg daily for a few days until weight returns to baseline.

## 2018-09-19 NOTE — Telephone Encounter (Signed)
Patient informed and verbalized understanding of plan. 

## 2018-09-19 NOTE — Telephone Encounter (Signed)
Janett Billow with [facility] [phone])  States that patient has lost 14lbs in the past 2 weeks. Her weight is 98.8 09/19/2018 . Patient is wanting to know if she can go to Lasix 20 mg instead of taking the 40 mg.

## 2018-10-28 ENCOUNTER — Other Ambulatory Visit (HOSPITAL_COMMUNITY): Payer: Self-pay | Admitting: Nurse Practitioner

## 2018-10-28 DIAGNOSIS — Z1231 Encounter for screening mammogram for malignant neoplasm of breast: Secondary | ICD-10-CM

## 2018-11-02 LAB — BASIC METABOLIC PANEL
BUN: 16 mg/dL (ref 7–25)
CO2: 28 mmol/L (ref 20–32)
Calcium: 9.1 mg/dL (ref 8.6–10.4)
Chloride: 97 mmol/L — ABNORMAL LOW (ref 98–110)
Creat: 0.72 mg/dL (ref 0.60–0.88)
Glucose, Bld: 88 mg/dL (ref 65–99)
Potassium: 4.6 mmol/L (ref 3.5–5.3)
Sodium: 132 mmol/L — ABNORMAL LOW (ref 135–146)

## 2018-11-03 ENCOUNTER — Telehealth: Payer: Self-pay | Admitting: *Deleted

## 2018-11-03 NOTE — Telephone Encounter (Signed)
Patient informed. Copy sent to PCP °

## 2018-11-03 NOTE — Telephone Encounter (Signed)
-----   Message from Satira Sark, MD sent at 11/02/2018  2:23 PM EDT ----- Results reviewed.  Renal function remains normal as does potassium.

## 2018-11-07 ENCOUNTER — Ambulatory Visit: Payer: Medicare Other | Admitting: Cardiology

## 2018-11-17 ENCOUNTER — Other Ambulatory Visit: Payer: Self-pay

## 2018-11-17 ENCOUNTER — Ambulatory Visit (HOSPITAL_COMMUNITY)
Admission: RE | Admit: 2018-11-17 | Discharge: 2018-11-17 | Disposition: A | Discharge status: Home / Self Care | Payer: Medicare Other | Source: Ambulatory Visit | Attending: Nurse Practitioner | Admitting: Nurse Practitioner

## 2018-11-17 DIAGNOSIS — Z1231 Encounter for screening mammogram for malignant neoplasm of breast: Secondary | ICD-10-CM | POA: Diagnosis not present

## 2018-11-28 ENCOUNTER — Ambulatory Visit: Payer: Medicare Other | Admitting: Internal Medicine

## 2018-11-28 ENCOUNTER — Other Ambulatory Visit: Payer: Self-pay

## 2018-11-28 ENCOUNTER — Encounter: Payer: Self-pay | Admitting: Internal Medicine

## 2018-11-28 DIAGNOSIS — R0609 Other forms of dyspnea: Secondary | ICD-10-CM

## 2018-11-28 DIAGNOSIS — J479 Bronchiectasis, uncomplicated: Secondary | ICD-10-CM | POA: Diagnosis not present

## 2018-11-28 NOTE — Progress Notes (Signed)
Subjective:     Patient ID: Shelly Fields, female   DOB: 05/15/1935,    MRN: [id number]    Brief patient profile:  63 yowf never smoked started with  Chronic cough  Developed 12/2015 mostly with bending over with mostly yellowish with steaks of blood while xarelto but for the most part no blood and w/u bronchiolectasis with  ? MAI in [place] so referred to pulmonary clinic 03/22/2017 by Nicanor Bake     History of Present Illness  03/22/2017 1st [facility] office visit/ Samarra Ridgely   Chief Complaint  Patient presents with  . Pulmonary Consult    Referred by Dr. Woody Seller for eval of [facility]. Pt c/o occ dry cough.   most days x 15 months only "spits up" mucus (clears throat "I don't cough" )  in afternoons < tbsp yellowish thick mucus  Not limited by breathing from desired activities   Assoc with voice fatigue rec Pantoprazole (protonix) 40 mg   Take  30-60 min before first meal of the day and Pepcid (famotidine)  20 mg one @  bedtime until return to office - this is the best way to tell whether stomach acid is contributing to your problem.   GERD (REFLUX)  Diet    05/13/2017  f/u ov/Mylah Baynes re:   MAI/ cough has resolved on GERD rx  Not limited by breathing from desired activities   No am excess mucus rec No change in your medications until the pantaprazole (protonix)  expires (all the refills)  Then stop it Continue the diet and the pepcid (famotidine) at bedtime and when you run out of the pantaprazole refills then take the pepcid after bfast also    08/11/2017  f/u ov/Sedalia Greeson re: bronchiolectasis, no worse symptoms off ppi/h2  Chief Complaint  Patient presents with  . Follow-up    Cxr done today. Cough is overall improved. She has occ cough with yellow sputum.    Dyspnea: Not limited by breathing from desired activities   Cough: minimal light yellow/ never bloody/ most notable in am  Sleep: ok s am flare of cough  SABA use:  NONE  rec For cough mucinex 600 mg up to 2 every 12 hours  iff  the mucus turns nasty or bloody please call       02/11/2018  f/u ov/Kaedyn Belardo re:  bronchiolectasis? mai  Chief Complaint  Patient presents with  . Follow-up    She only has occ cough- yellow sputum.    Dyspnea:  MMRC1 = can walk nl pace, flat grade, can't hurry or go uphills or steps s sob   Cough: sometimes supine / does not keep her up or flare in am's Sleeping: elevated and rotated R side one pillow  SABA use: none 02: none    rec Bronchiectasis pathyophysiology and rx     11/28/2018  f/u ov/Soraya Paquette re: bronchiolectasis  Chief Complaint  Patient presents with  . Follow-up    Breathing is overall doing well. She has a runny nose. Not coughing much.  Dyspnea:  MMRC2 = can't walk a nl pace on a flat grade s sob but does fine slow and flat / more slowed by  Fatigue and palpitations  Cough: none Sleeping: slt elevelation hob > sleeps ok  SABA use: none  02: none    No obvious day to day or daytime variability or assoc excess/ purulent sputum or mucus plugs or hemoptysis or cp or chest tightness, subjective wheeze or overt sinus or hb symptoms.  Sleeping as above  without nocturnal  or early am exacerbation  of respiratory  c/o's or need for noct saba. Also denies any obvious fluctuation of symptoms with weather or environmental changes or other aggravating or alleviating factors except as outlined above   No unusual exposure hx or h/o childhood pna/ asthma or knowledge of premature birth.  Current Allergies, Complete Past Medical History, Past Surgical History, Family History, and Social History were reviewed in [employer] record.  ROS  The following are not active complaints unless bolded Hoarseness, sore throat, dysphagia, dental problems, itching, sneezing,  nasal congestion or discharge of excess mucus or purulent secretions, ear ache,   fever, chills, sweats, unintended wt loss or wt gain, classically pleuritic or exertional cp,  orthopnea pnd or arm/hand  swelling  or leg swelling, presyncope, palpitations, abdominal pain, anorexia, nausea, vomiting, diarrhea  or change in bowel habits or change in bladder habits, change in stools or change in urine, dysuria, hematuria,  rash, arthralgias, visual complaints, headache, numbness, weakness or ataxia or problems with walking or coordination,  change in mood or  memory.        Current Meds  Medication Sig  . Carboxymethylcellulose Sodium (THERATEARS OP) Place 1 drop into both eyes 2 (two) times daily.  Marland Kitchen denosumab (PROLIA) 60 MG/ML SOSY injection Inject 60 mg into the skin every 6 (six) months.  . docusate sodium (COLACE) 100 MG capsule Take 100 mg by mouth at bedtime.   . furosemide (LASIX) 20 MG tablet Take 1 tablet (20 mg total) by mouth daily. For weight gain of 2-3 lbs in 24 hours or 5 lbs in a week, increase to 40 mg daily for few days until weight is returns to baseline  . levothyroxine (SYNTHROID, LEVOTHROID) 50 MCG tablet Take 50 mcg by mouth every other day.  . metoprolol succinate (TOPROL-XL) 25 MG 24 hr tablet Take 25 mg by mouth 2 (two) times daily.  . metoprolol succinate (TOPROL-XL) 50 MG 24 hr tablet Take 1 tablet (50 mg total) by mouth 2 (two) times daily.  . Multiple Vitamins-Minerals (CENTRUM SILVER 50+WOMEN PO) Take 1 tablet by mouth daily.  . potassium chloride (K-DUR) 10 MEQ tablet Take 1 tablet by mouth daily.  Marland Kitchen senna (SENOKOT) 8.6 MG tablet Take 1 tablet by mouth See admin instructions. Take 1 tablet by mouth every other night  . XARELTO 20 MG TABS tablet TAKE 1 TABLET BY MOUTH  DAILY  . [DISCONTINUED] furosemide (LASIX) 20 MG tablet Take 1 tablet by mouth daily.  . [DISCONTINUED] potassium chloride (K-DUR) 10 MEQ tablet Take 1 tablet (10 mEq total) by mouth daily.                  Objective:   Physical Exam  amb wf very hard of hearing     11/28/2018        97   02/11/2018          100  08/11/2017         102   05/13/2017      102   03/22/17 101 lb (45.8 kg)   03/18/17 101 lb (45.8 kg)  09/15/16 97 lb 12.8 oz (44.4 kg)      Vital signs reviewed - Note on arrival 02 sats  96% on RA         HEENT: nl dentition, turbinates bilaterally, and oropharynx. Nl external ear canals without cough reflex   NECK :  without JVD/Nodes/TM/ nl carotid upstrokes  bilaterally   LUNGS: no acc muscle use,  Nl contour chest with scattered insp pops/squeaks  bilaterally without cough on insp or exp maneuvers   CV:  IRIR apical rate around 100  no s3 or murmur or increase in P2, and no edema   ABD:  soft and nontender with nl inspiratory excursion in the supine position. No bruits or organomegaly appreciated, bowel sounds nl  MS:  Nl gait/ ext warm without deformities, calf tenderness, cyanosis or clubbing No obvious joint restrictions   SKIN: warm and dry without lesions    NEURO:  alert, approp, nl sensorium with  no motor or cerebellar deficits apparent.         I personally reviewed images and agree with radiology impression as follows:   Chest CT 08/23/18 Bilateral lower lobe bronchiectasis with scarring rml/lingula        Assessment:

## 2018-11-28 NOTE — Patient Instructions (Signed)
Ok to return in one year - call sooner if needed

## 2018-11-29 ENCOUNTER — Encounter: Payer: Self-pay | Admitting: Internal Medicine

## 2018-11-29 ENCOUNTER — Telehealth: Payer: Self-pay | Admitting: Internal Medicine

## 2018-11-29 NOTE — Assessment & Plan Note (Addendum)
Onset of cough 2017  Chest CT  01/06/17  C/w MAI with "bronciolectasis" L > R bases / no real change since  02/11/16  but new findings c/w PH - PFT's  05/13/2017  FEV1 1.54 (85 % ) ratio 64   p nothing prior to study with DLCO  59/63c % corrects to 103 % for alv volume   - CT 08/23/18 no change    This is an extremely common benign condition in the elderly and does not warrant aggressive eval/ rx at this point unless there is a clinical correlation suggesting unaddressed pulmonary infection (purulent sputum, night sweats, unintended wt loss, doe) or evolution of  obvious changes on plain cxr (as opposed to serial CT, which is way over sensitive to make clinical decisions re intervention and treatment in the elderly, who tend to tolerate both dx and treatment poorly) .   No evidence of clinical or radiographic worsening by CT, which as above is overly sensitive in terms of helping making decisions re w/u and rx - she is very reluctant to even return to this clinic at this point as she feels so much better than last ov and says she'd rather just call if needed  I suggested a yearly cxr to keep up with "macroscopic" cxr changes and she agreed   Each maintenance medication was reviewed in detail including most importantly the difference between maintenance and as needed and under what circumstances the prns are to be used.  Please see AVS for specific  Instructions which are unique to this visit and I personally typed out  which were reviewed in detail in writing with the patient and a copy provided.    Total time of visit = 25 min with majority  spent in counseling.

## 2018-11-29 NOTE — Telephone Encounter (Signed)
Attempted to call pt but unable to reach. Left message for pt to return call. 

## 2018-11-29 NOTE — Assessment & Plan Note (Signed)
Echo 08/17/18  Dilated right ventricle - severe  Moderately decreased right ventricular systolic function  Tricuspid regurgitation - severe  Normal left ventricular systolic function, ejection fraction 60 to 49%  Diastolic dysfunction - grade III (severely elevated filling pressures)  Dilated right atrium - severe  Dilated left atrium - moderate to severe  Elevated right atrial pressure  Echo reviewed -  I doubt she has PAH but more likely WHO III pattern PH related to severe diastolic dysfunction > cards f/u planned with ? RHC needed if more symptomatic.

## 2018-11-30 NOTE — Telephone Encounter (Signed)
Called and spoke with Patient.  Patient stated on her AVS, potassium was under discontinued meds, but she stated she is still on potassium,and it was on her AVS, on med list.  Potassium was removed, but is still on current med list.  Explained  potassium was on list twice, and one was removed.  Patient stated understanding.  Nothing further at this time.

## 2018-12-08 IMAGING — MG DIGITAL SCREENING BILATERAL MAMMOGRAM WITH TOMO AND CAD
8 series · 9 of 24 positions shown · non-contrast
Comparison: Previous exam(s).

CLINICAL DATA: Screening.

EXAM:
DIGITAL SCREENING BILATERAL MAMMOGRAM WITH TOMO AND CAD

[L CC]
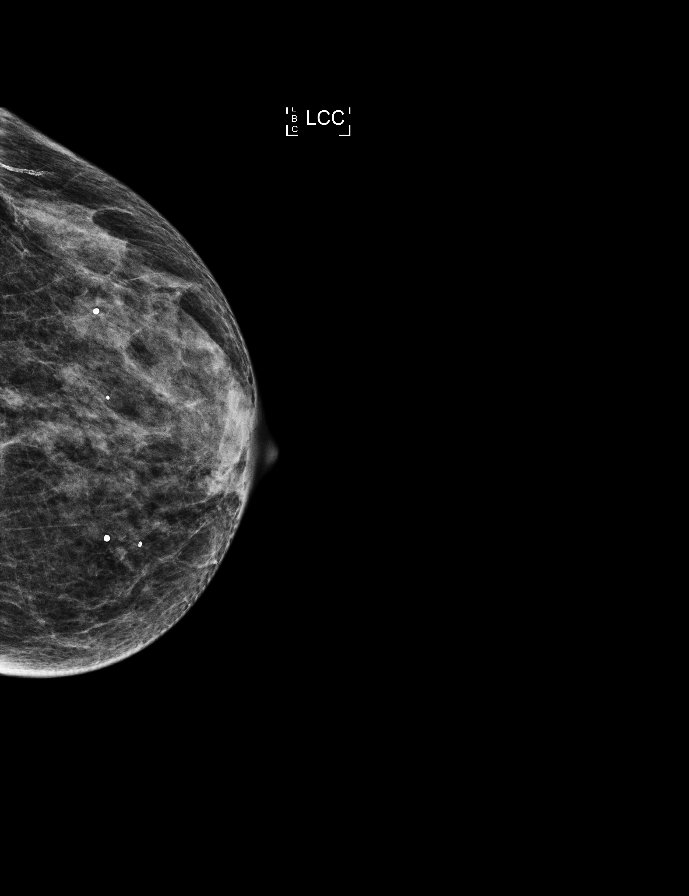

[L MLO]
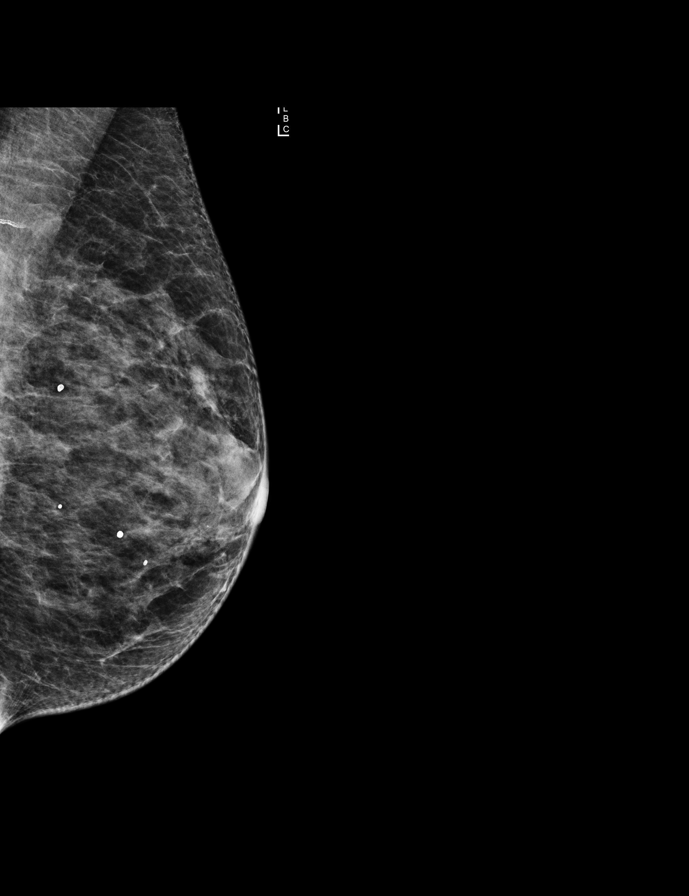

[R MLO]
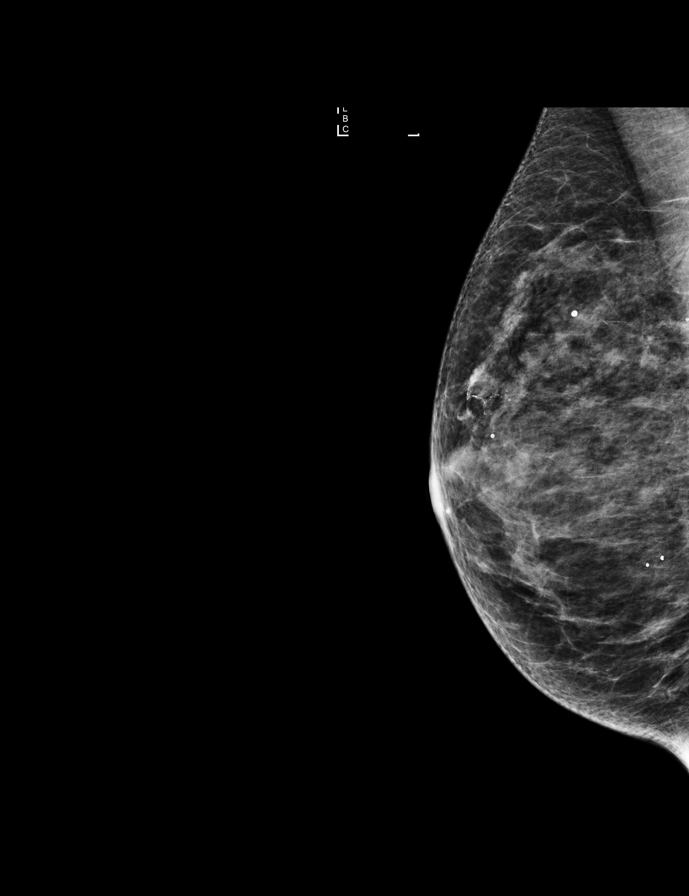

[R CC]
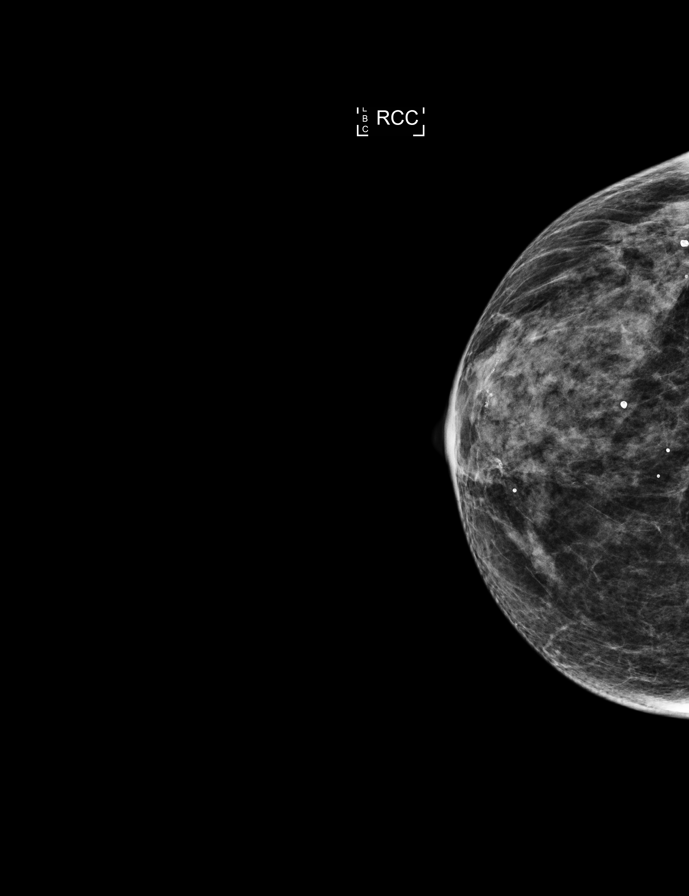

[L CC tomo · 2 of 39 frames shown]
[frame 13/39]
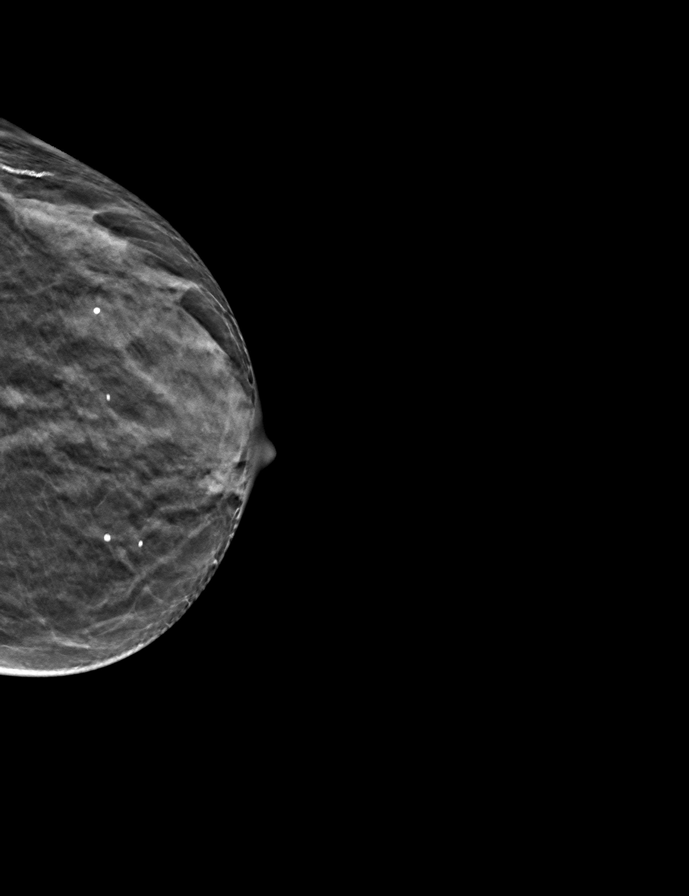
[frame 20/39]
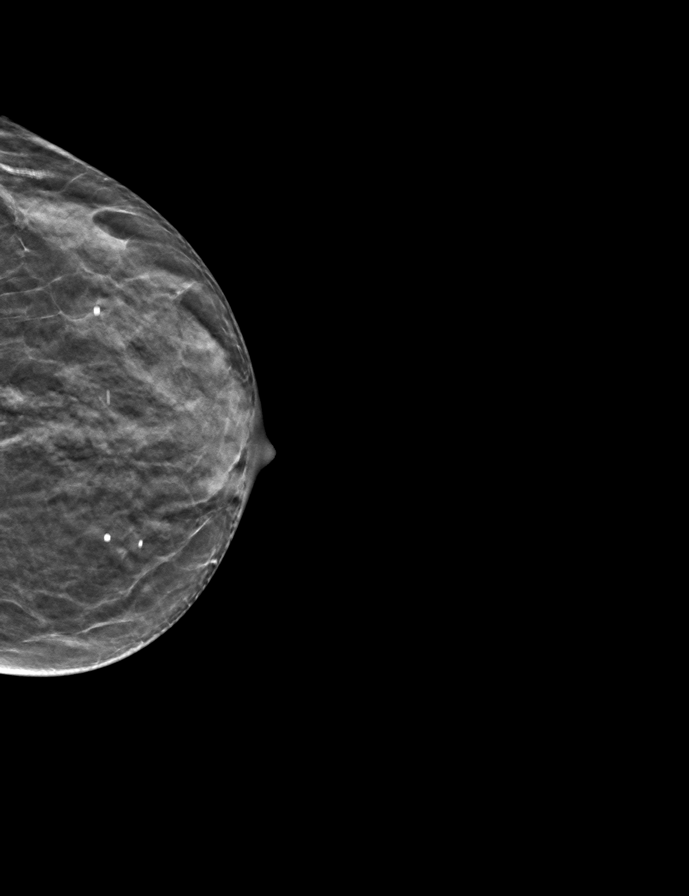

[L MLO tomo · tomo slice 21/40.0]
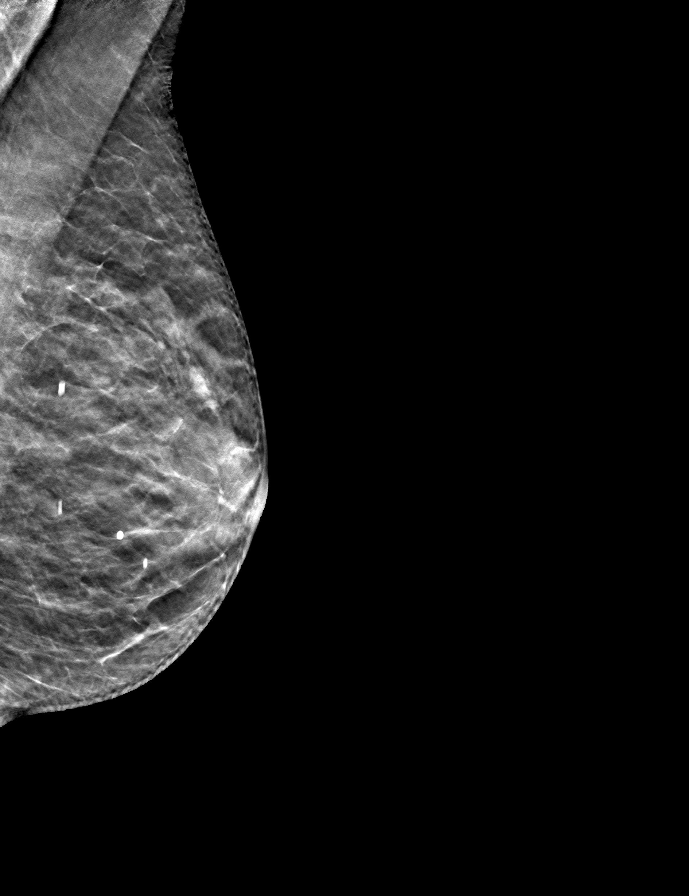

[R CC tomo · tomo slice 21/41.0]
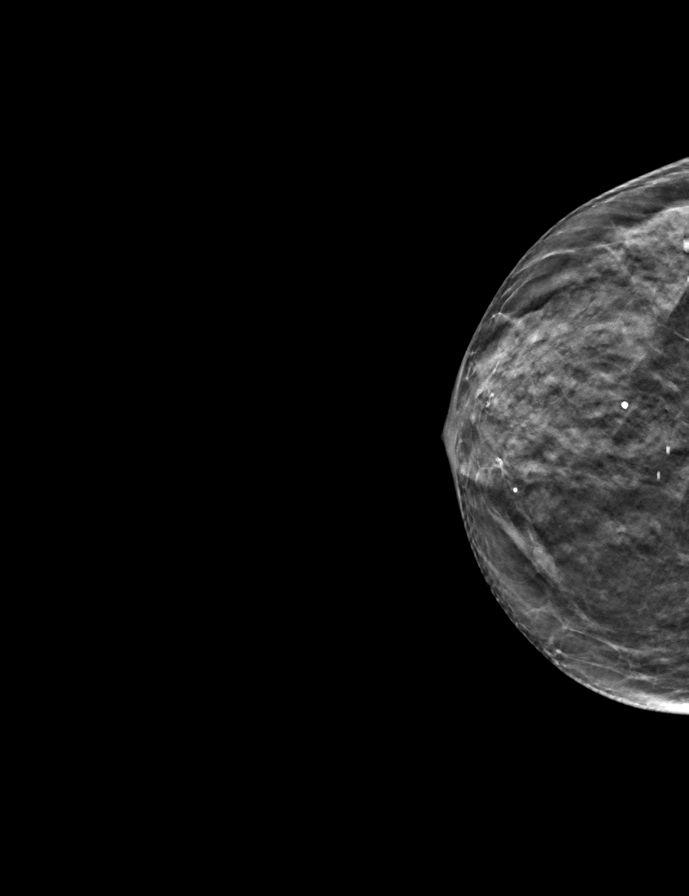

[R MLO tomo · tomo slice 21/40.0]
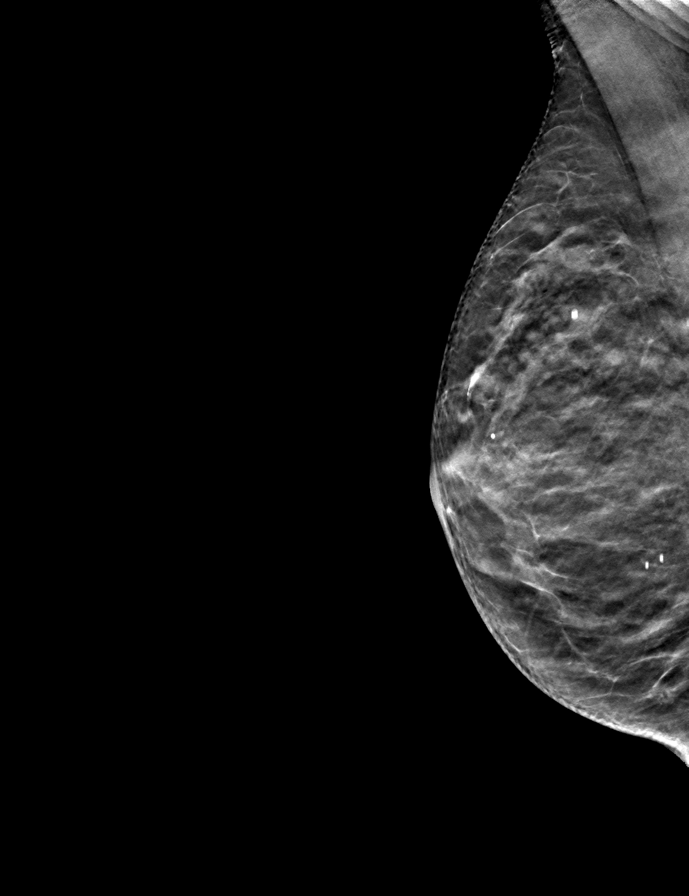

[9 of 24 positions shown; findings below may reference images not displayed]

ACR Breast Density Category c: The breast tissue is heterogeneously
dense, which may obscure small masses.
FINDINGS: There are no findings suspicious for malignancy. Images were
processed with CAD.
IMPRESSION: No mammographic evidence of malignancy. A result letter of this
screening mammogram will be mailed directly to the patient.

RECOMMENDATION:
Screening mammogram in one year. (Code:FT-U-LHB)

BI-RADS CATEGORY  1: Negative.

## 2019-01-04 ENCOUNTER — Ambulatory Visit: Payer: Medicare Other | Admitting: Cardiology

## 2019-01-06 ENCOUNTER — Telehealth: Payer: Self-pay | Admitting: *Deleted

## 2019-01-06 NOTE — Telephone Encounter (Signed)
The patient verbally consented for a telehealth phone visit with [facility] and understands that his/her insurance company will be billed for the encounter.   She will have vitals & medications.

## 2019-01-12 ENCOUNTER — Other Ambulatory Visit: Payer: Self-pay | Admitting: *Deleted

## 2019-01-12 ENCOUNTER — Telehealth (INDEPENDENT_AMBULATORY_CARE_PROVIDER_SITE_OTHER): Payer: Medicare Other | Admitting: Cardiology

## 2019-01-12 ENCOUNTER — Encounter: Payer: Self-pay | Admitting: Cardiology

## 2019-01-12 VITALS — BP 117/72 | HR 78 | Ht 62.0 in | Wt 94.2 lb

## 2019-01-12 DIAGNOSIS — I4819 Other persistent atrial fibrillation: Secondary | ICD-10-CM

## 2019-01-12 DIAGNOSIS — I361 Nonrheumatic tricuspid (valve) insufficiency: Secondary | ICD-10-CM | POA: Diagnosis not present

## 2019-01-12 DIAGNOSIS — Z79899 Other long term (current) drug therapy: Secondary | ICD-10-CM

## 2019-01-12 DIAGNOSIS — I10 Essential (primary) hypertension: Secondary | ICD-10-CM

## 2019-01-12 DIAGNOSIS — I519 Heart disease, unspecified: Secondary | ICD-10-CM

## 2019-01-12 MED ORDER — FUROSEMIDE 20 MG PO TABS: 20.0000 mg | ORAL_TABLET | Freq: Every day | ORAL | 2 refills | Status: DC

## 2019-01-12 MED ORDER — RIVAROXABAN 20 MG PO TABS: 20.0000 mg | ORAL_TABLET | Freq: Every day | ORAL | 0 refills | Status: DC

## 2019-01-12 MED ORDER — POTASSIUM CHLORIDE ER 10 MEQ PO TBCR: 10.0000 meq | EXTENDED_RELEASE_TABLET | Freq: Every day | ORAL | 2 refills | Status: DC

## 2019-01-12 MED ORDER — METOPROLOL SUCCINATE ER 25 MG PO TB24: 25.0000 mg | ORAL_TABLET | Freq: Two times a day (BID) | ORAL | 2 refills | Status: DC

## 2019-01-12 NOTE — Progress Notes (Signed)
Virtual Visit via Telephone Note   This visit type was conducted due to national recommendations for restrictions regarding the COVID-19 Pandemic (e.g. social distancing) in an effort to limit this patient's exposure and mitigate transmission in our community.  Due to her co-morbid illnesses, this patient is at least at moderate risk for complications without adequate follow up.  This format is felt to be most appropriate for this patient at this time.  The patient did not have access to video technology/had technical difficulties with video requiring transitioning to audio format only (telephone).  All issues noted in this document were discussed and addressed.  No physical exam could be performed with this format.  Please refer to the patient's chart for her  consent to telehealth for [facility].   Date:  01/12/2019   ID:  Shelly Fields, DOB [birth year], MRN [id number]  Patient Location: Home Provider Location: Office  PCP:  Berenice Primas  Cardiologist:  Rozann Lesches, MD Electrophysiologist:  None   Evaluation Performed:  Follow-Up Visit  Chief Complaint:   Cardiac follow-up  History of Present Illness:    Shelly Fields is an 84 y.o. female last assessed via telehealth encounter in April.  She did not have video access and we spoke by phone today.  She tells me that she has done well, weight has not crept back up on diuretics, she does not report any worsening leg swelling, no orthopnea or PND.  She does not describe any palpitations or chest pain.  She does walk for exercise, has had some difficulty with arthritic pains.  We went over her medications which are listed below.  She reports no bleeding problems.  Lab work from May is outlined below.  The patient does not have symptoms concerning for COVID-19 infection (fever, chills, cough, or new shortness of breath).    Past Medical History:  Diagnosis Date  . Diverticulosis   . Essential hypertension   .  Hypothyroidism   . Internal hemorrhoids   . Paroxysmal atrial fibrillation (HCC)   . Seasonal allergies   . Shingles    Past Surgical History:  Procedure Laterality Date  . ABDOMINAL HYSTERECTOMY    . CATARACT EXTRACTION    . COLONOSCOPY N/A 01/12/2013   Procedure: COLONOSCOPY;  Surgeon: Rogene Houston, MD;  Location: [facility];  Service: Endoscopy;  Laterality: N/A;  1030  . COLONOSCOPY N/A 11/18/2017   Procedure: COLONOSCOPY;  Surgeon: Rogene Houston, MD;  Location: [facility];  Service: Endoscopy;  Laterality: N/A;  . POLYPECTOMY  11/18/2017   Procedure: POLYPECTOMY;  Surgeon: Rogene Houston, MD;  Location: [facility];  Service: Endoscopy;;  colon     Current Meds  Medication Sig  . calcium-vitamin D 250-100 MG-UNIT tablet Take 1 tablet by mouth every other day.  . Carboxymethylcellulose Sodium (THERATEARS OP) Place 1 drop into both eyes daily.   Marland Kitchen denosumab (PROLIA) 60 MG/ML SOSY injection Inject 60 mg into the skin every 6 (six) months.  . docusate sodium (COLACE) 100 MG capsule Take 100 mg by mouth at bedtime.   . furosemide (LASIX) 20 MG tablet Take 1 tablet (20 mg total) by mouth daily. For weight gain of 2-3 lbs in 24 hours or 5 lbs in a week, increase to 40 mg daily for few days until weight is returns to baseline  . levothyroxine (SYNTHROID, LEVOTHROID) 50 MCG tablet Take 50 mcg by mouth every other day.  . metoprolol succinate (TOPROL-XL) 25 MG  24 hr tablet Take 1 tablet (25 mg total) by mouth 2 (two) times daily.  . metoprolol succinate (TOPROL-XL) 50 MG 24 hr tablet Take 1 tablet (50 mg total) by mouth 2 (two) times daily.  . Multiple Vitamins-Minerals (CENTRUM SILVER 50+WOMEN PO) Take 1 tablet by mouth every other day.   . potassium chloride (K-DUR) 10 MEQ tablet Take 1 tablet (10 mEq total) by mouth daily.  Marland Kitchen senna (SENOKOT) 8.6 MG tablet Take 1 tablet by mouth daily as needed for constipation. Take 1 tablet by mouth every other night  . XARELTO 20 MG TABS  tablet TAKE 1 TABLET BY MOUTH  DAILY  . [DISCONTINUED] furosemide (LASIX) 20 MG tablet Take 1 tablet (20 mg total) by mouth daily. For weight gain of 2-3 lbs in 24 hours or 5 lbs in a week, increase to 40 mg daily for few days until weight is returns to baseline  . [DISCONTINUED] metoprolol succinate (TOPROL-XL) 25 MG 24 hr tablet Take 25 mg by mouth 2 (two) times daily.  . [DISCONTINUED] potassium chloride (K-DUR) 10 MEQ tablet Take 1 tablet by mouth daily.     Allergies:   Patient has no known allergies.   Social History   Tobacco Use  . Smoking status: Never Smoker  . Smokeless tobacco: Never Used  Substance Use Topics  . Alcohol use: No    Alcohol/week: 0.0 standard drinks  . Drug use: No     Family Hx: The patient's family history includes Heart disease in her mother; Hypertension in her father. There is no history of Colon cancer.  ROS:   Please see the history of present illness.    All other systems reviewed and are negative.   Prior CV studies:   The following studies were reviewed today:  Echocardiogram 09/25/2017: Study Conclusions  - Left ventricle: The cavity size was normal. Wall thickness was normal. Systolic function was normal. The estimated ejection fraction was in the range of 60% to 65%. Wall motion was normal; there were no regional wall motion abnormalities. The study was not technically sufficient to allow evaluation of LV diastolic dysfunction due to atrial fibrillation. - Aortic valve: There was mild regurgitation. - Mitral valve: There was mild to moderate regurgitation. - Left atrium: The atrium was severely dilated. - Right atrium: The atrium was massively dilated. - Tricuspid valve: There was severe regurgitation. - Systemic veins: IVC is dilated with normal respiratory variation. Estimated CVP 8 mmHg.  Labs/Other Tests and Data Reviewed:    EKG:  An ECG dated 08/17/2018 was personally reviewed today and demonstrated:  Rapid  atrial fibrillation 135 bpm.  Recent Labs: 11/02/2018: BUN 16; Creat 0.72; Potassium 4.6; Sodium 132  May 2020: Hemoglobin 12.3, platelets 238, BUN 13, creatinine 0.79, potassium 4.8, AST 21, ALT 12, cholesterol 135, triglycerides of 65, HDL 52, LDL 70, TSH 3.5  Wt Readings from Last 3 Encounters:  01/12/19 94 lb 3.2 oz (42.7 kg)  11/28/18 97 lb 6.4 oz (44.2 kg)  09/15/18 101 lb 3.2 oz (45.9 kg)     Objective:    Vital Signs:  BP 117/72   Pulse 78   Ht 5\' 2"  (1.575 m)   Wt 94 lb 3.2 oz (42.7 kg)   BMI 17.23 kg/m    Patient spoke in full sentences, not short of breath. No audible wheezing or coughing. Speech pattern normal.  ASSESSMENT & PLAN:    1.  Persistent atrial fibrillation.  Continue with current regimen including Toprol-XL and Xarelto.  She is symptomatically stable, reports no palpitations or breathlessness.  We will most likely manage a strategy of permanent atrial fibrillation going forward.  2.  Chronic diastolic heart failure.  Weight is down, she reports no progressive leg swelling.  Continue Lasix.  Follow-up BMET for next visit.  3.  Severe tricuspid regurgitation with right ventricular dysfunction, also contributing to weight gain and leg swelling.  This has been stable.  COVID-19 Education: The signs and symptoms of COVID-19 were discussed with the patient and how to seek care for testing (follow up with PCP or arrange E-visit).  The importance of social distancing was discussed today.  Time:   Today, I have spent 9 minutes with the patient with telehealth technology discussing the above problems.     Medication Adjustments/Labs and Tests Ordered: Current medicines are reviewed at length with the patient today.  Concerns regarding medicines are outlined above.   Tests Ordered: Orders Placed This Encounter  Procedures  . Basic metabolic panel  . CBC    Medication Changes: Meds ordered this encounter  Medications  . potassium chloride (K-DUR) 10 MEQ  tablet    Sig: Take 1 tablet (10 mEq total) by mouth daily.    Dispense:  90 tablet    Refill:  2  . metoprolol succinate (TOPROL-XL) 25 MG 24 hr tablet    Sig: Take 1 tablet (25 mg total) by mouth 2 (two) times daily.    Dispense:  180 tablet    Refill:  2  . furosemide (LASIX) 20 MG tablet    Sig: Take 1 tablet (20 mg total) by mouth daily. For weight gain of 2-3 lbs in 24 hours or 5 lbs in a week, increase to 40 mg daily for few days until weight is returns to baseline    Dispense:  180 tablet    Refill:  2    04/13//20 dose decrease    Follow Up:  In Person 4 months in the [place] office.  Signed, Rozann Lesches, MD  01/12/2019 9:55 AM    [place]

## 2019-01-12 NOTE — Patient Instructions (Addendum)
Medication Instructions:   Your physician recommends that you continue on your current medications as directed. Please refer to the Current Medication list given to you today.  Labwork:  Your physician recommends that you return for lab work in: 4 months just before your next visit to check your BMET & CBC. Please have this done at [facility]. You do not have to fast for this lab work. Lab orders will be picked up by patient.  Testing/Procedures:  NONE  Follow-Up:  Your physician recommends that you schedule a follow-up appointment in: 4 month office visit.  Any Other Special Instructions Will Be Listed Below (If Applicable).  If you need a refill on your cardiac medications before your next appointment, please call your pharmacy.

## 2019-01-20 ENCOUNTER — Other Ambulatory Visit: Payer: Self-pay | Admitting: *Deleted

## 2019-01-20 MED ORDER — POTASSIUM CHLORIDE ER 10 MEQ PO TBCR: 10.0000 meq | EXTENDED_RELEASE_TABLET | Freq: Every day | ORAL | 1 refills | Status: DC

## 2019-01-20 NOTE — Telephone Encounter (Signed)
Notification received from [facility] that K-Dur 10 meq has been discontinued by the manufacturer and is no longer available. Requesting a new prescription replacement. Klor-con 10 meq sent to [facility].

## 2019-02-07 ENCOUNTER — Other Ambulatory Visit: Payer: Self-pay | Admitting: *Deleted

## 2019-02-07 MED ORDER — METOPROLOL SUCCINATE ER 50 MG PO TB24: 50.0000 mg | ORAL_TABLET | Freq: Two times a day (BID) | ORAL | 2 refills | Status: DC

## 2019-03-14 ENCOUNTER — Other Ambulatory Visit: Payer: Self-pay | Admitting: Pharmacist

## 2019-03-14 ENCOUNTER — Telehealth: Payer: Self-pay | Admitting: Pharmacist

## 2019-03-14 MED ORDER — RIVAROXABAN 15 MG PO TABS: 15.0000 mg | ORAL_TABLET | Freq: Every day | ORAL | 1 refills | Status: DC

## 2019-03-14 NOTE — Telephone Encounter (Signed)
Left message to explain below message again to pt.

## 2019-03-14 NOTE — Telephone Encounter (Signed)
Patient got notification that  Xarelto prescription was sent into her mail order pharmacy and they would be shipping it to her.   She is confused as to why it was sent in.  She thought that she was to finish the medication she currently had due to cost of medication for her.  She would like someone to call her and explain and she would like to know that Dr Domenic Polite is ok with changes being made.

## 2019-03-14 NOTE — Telephone Encounter (Addendum)
Pt is aware of dose reduction, she wishes to use the rest of her 20mg  samples before changing to the 15mg  dose. Explained rationale for dose reduction. New rx has been sent to pharmacy so that pt can start taking this once she is done using her samples, confirmed she uses mail order pharmacy. Will forward to Dr Domenic Polite as an Juluis Rainier regarding Xarelto dose reduction.

## 2019-03-14 NOTE — Telephone Encounter (Signed)
Called pt and left message - she requires dose reduction of Xarelto from 20mg  to 15mg  daily due to CrCl of 104mL/min.

## 2019-03-15 NOTE — Telephone Encounter (Signed)
Spoke with patient. Explained again why her dose changed. Reassured her she could take the rest of the 20mg  and start the 15mg  when she was finshed with the 20s. Patient appreciative of the call

## 2019-05-10 LAB — BASIC METABOLIC PANEL
BUN: 14 mg/dL (ref 7–25)
CO2: 29 mmol/L (ref 20–32)
Calcium: 9.4 mg/dL (ref 8.6–10.4)
Chloride: 96 mmol/L — ABNORMAL LOW (ref 98–110)
Creat: 0.74 mg/dL (ref 0.60–0.88)
Glucose, Bld: 61 mg/dL — ABNORMAL LOW (ref 65–139)
Potassium: 4.6 mmol/L (ref 3.5–5.3)
Sodium: 131 mmol/L — ABNORMAL LOW (ref 135–146)

## 2019-05-10 LAB — CBC
HCT: 39.1 % (ref 35.0–45.0)
Hemoglobin: 12.4 g/dL (ref 11.7–15.5)
MCH: 27.6 pg (ref 27.0–33.0)
MCHC: 31.7 g/dL — ABNORMAL LOW (ref 32.0–36.0)
MCV: 87.1 fL (ref 80.0–100.0)
MPV: 10.6 fL (ref 7.5–12.5)
Platelets: 222 10*3/uL (ref 140–400)
RBC: 4.49 10*6/uL (ref 3.80–5.10)
RDW: 12.5 % (ref 11.0–15.0)
WBC: 6.4 10*3/uL (ref 3.8–10.8)

## 2019-05-11 ENCOUNTER — Telehealth: Payer: Self-pay | Admitting: *Deleted

## 2019-05-11 NOTE — Telephone Encounter (Signed)
-----   Message from Satira Sark, MD sent at 05/10/2019  4:43 PM EST ----- Results reviewed.  Renal function and potassium are stable.  No change in current regimen.

## 2019-05-11 NOTE — Telephone Encounter (Signed)
-----   Message from Satira Sark, MD sent at 05/10/2019  2:59 PM EST ----- Results reviewed.  Hemoglobin normal at 12.4.  Continue with current therapy.

## 2019-05-11 NOTE — Telephone Encounter (Signed)
Patient informed. Copy sent to PCP °

## 2019-05-16 ENCOUNTER — Ambulatory Visit: Payer: Medicare Other | Admitting: Family Medicine

## 2019-05-16 ENCOUNTER — Encounter: Payer: Self-pay | Admitting: Cardiology

## 2019-05-16 ENCOUNTER — Other Ambulatory Visit: Payer: Self-pay

## 2019-05-16 VITALS — BP 132/70 | HR 71 | Ht 62.0 in | Wt 99.0 lb

## 2019-05-16 DIAGNOSIS — I10 Essential (primary) hypertension: Secondary | ICD-10-CM | POA: Diagnosis not present

## 2019-05-16 DIAGNOSIS — I4819 Other persistent atrial fibrillation: Secondary | ICD-10-CM

## 2019-05-16 DIAGNOSIS — I361 Nonrheumatic tricuspid (valve) insufficiency: Secondary | ICD-10-CM | POA: Diagnosis not present

## 2019-05-16 DIAGNOSIS — I5032 Chronic diastolic (congestive) heart failure: Secondary | ICD-10-CM | POA: Diagnosis not present

## 2019-05-16 MED ORDER — RIVAROXABAN 15 MG PO TABS: 15.0000 mg | ORAL_TABLET | Freq: Every day | ORAL | 0 refills | Status: DC

## 2019-05-16 NOTE — Patient Instructions (Addendum)
Medication Instructions:    Your physician recommends that you continue on your current medications as directed. Please refer to the Current Medication list given to you today.  Labwork:  Your physician recommends that you return for non-fasting lab work in: 6 months just before your next visit to check your BMET & CBC. We will notify you when this is due.  Testing/Procedures:  NONE  Follow-Up:  Your physician recommends that you schedule a follow-up appointment in: 6 months. You will receive a reminder letter in the mail in about 4 months reminding you to call and schedule your appointment. If you don't receive this letter, please contact our office.  Any Other Special Instructions Will Be Listed Below (If Applicable).  If you need a refill on your cardiac medications before your next appointment, please call your pharmacy.

## 2019-05-16 NOTE — Progress Notes (Addendum)
Cardiology Office Note  Date: 05/16/2019   ID: Kynsey, Susko [birth year], MRN [id number]  PCP:  Berenice Primas  Cardiologist:  Rozann Lesches, MD Electrophysiologist:  None   Chief Complaint  Patient presents with  . Follow-up    History of Present Illness: Shelly Fields is a 84 y.o. female last seen in August 2020 via telehealth visit for follow-up related to her persistent atrial fibrillation, chronic diastolic heart failure, severe tricuspid regurgitation/pulmonary hypertension.  At last visit patient had been doing well with no increase in weight on diuretics.  She did not report any significant swelling or edema.  She denied any significant orthopnea, PND or dyspnea on exertion.  She was walking some at home per her statement at last visit but was limited by arthritic pain. Denies any recent significant palpitations.  She had a subsequent echocardiogram in March of this year at [facility].  Results are below.  Patient states she has been doing well from a cardiac standpoint.  She denies any, dyspnea, edema, palpitations, weight gain.  She is taking her Lasix and Toprol as directed   Recent lab work CBC and BMP reviewed with patient.     Past Medical History:  Diagnosis Date  . Diverticulosis   . Essential hypertension   . Hypothyroidism   . Internal hemorrhoids   . Paroxysmal atrial fibrillation (HCC)   . Persistent atrial fibrillation ([place])   . Seasonal allergies   . Shingles     Past Surgical History:  Procedure Laterality Date  . ABDOMINAL HYSTERECTOMY    . CATARACT EXTRACTION    . COLONOSCOPY N/A 01/12/2013   Procedure: COLONOSCOPY;  Surgeon: Rogene Houston, MD;  [facility];  Service: Endoscopy;  Laterality: N/A;  1030  . COLONOSCOPY N/A 11/18/2017   Procedure: COLONOSCOPY;  Surgeon: Rogene Houston, MD;  [facility];  Service: Endoscopy;  Laterality: N/A;  . POLYPECTOMY  11/18/2017   Procedure: POLYPECTOMY;   Surgeon: Rogene Houston, MD;  [facility];  Service: Endoscopy;;  colon    Current Outpatient Medications  Medication Sig Dispense Refill  . calcium-vitamin D 250-100 MG-UNIT tablet Take 1 tablet by mouth every other day.    . Carboxymethylcellulose Sodium (THERATEARS OP) Place 1 drop into both eyes daily.     Marland Kitchen denosumab (PROLIA) 60 MG/ML SOSY injection Inject 60 mg into the skin every 6 (six) months.    . docusate sodium (COLACE) 100 MG capsule Take 100 mg by mouth at bedtime.     . furosemide (LASIX) 20 MG tablet Take 1 tablet (20 mg total) by mouth daily. For weight gain of 2-3 lbs in 24 hours or 5 lbs in a week, increase to 40 mg daily for few days until weight is returns to baseline 180 tablet 2  . levothyroxine (SYNTHROID, LEVOTHROID) 50 MCG tablet Take 50 mcg by mouth every other day.    . metoprolol succinate (TOPROL-XL) 50 MG 24 hr tablet Take 1 tablet (50 mg total) by mouth 2 (two) times daily. 180 tablet 2  . Multiple Vitamins-Minerals (CENTRUM SILVER 50+WOMEN PO) Take 1 tablet by mouth every other day.     . potassium chloride (KLOR-CON 10) 10 MEQ tablet Take 1 tablet (10 mEq total) by mouth daily. 90 tablet 1  . Rivaroxaban (XARELTO) 15 MG TABS tablet Take 1 tablet (15 mg total) by mouth daily with supper. 14 tablet 0  . senna (SENOKOT) 8.6 MG tablet Take  1 tablet by mouth daily as needed for constipation. Take 1 tablet by mouth every other night     No current facility-administered medications for this visit.    Allergies:  Patient has no known allergies.   Social History: The patient  reports that she has never smoked. She has never used smokeless tobacco. She reports that she does not drink alcohol or use drugs.   Family History: The patient's family history includes Heart disease in her mother; Hypertension in her father.   ROS:  Please see the history of present illness. Otherwise, complete review of systems is positive for none.  All other systems are  reviewed and negative.   Physical Exam: VS:  BP 132/70   Pulse 71   Ht 5\' 2"  (1.575 m)   Wt 99 lb (44.9 kg)   SpO2 98%   BMI 18.11 kg/m , BMI Body mass index is 18.11 kg/m.  Wt Readings from Last 3 Encounters:  05/16/19 99 lb (44.9 kg)  01/12/19 94 lb 3.2 oz (42.7 kg)  11/28/18 97 lb 6.4 oz (44.2 kg)    General: Patient appears comfortable at rest.. Neck: Supple, no elevated JVP or carotid bruits, no thyromegaly. Lungs: Clear to auscultation, nonlabored breathing at rest. Cardiac: irregularly irregular,  2+ RLSB systolic murmur, no pericardial rub. Extremities: No pitting edema, distal pulses 2+. Skin: Warm and dry. Neuropsychiatric: Alert and oriented x3, affect grossly appropriate.  ECG:  An ECG dated August 17, 2018 was personally reviewed today and demonstrated:  Atrial fibrillation rate of 135, anteroseptal infarct age undetermined.  MI finding now present  Recent Labwork: 05/10/2019: BUN 14; Creat 0.74; Hemoglobin 12.4; Platelets 222; Potassium 4.6; Sodium 131  No results found for: CHOL, TRIG, HDL, CHOLHDL, VLDL, LDLCALC, LDLDIRECT  Other Studies Reviewed Today: Echocardiogram [facility]  08/17/2018 Assessment and Plan:  1. Essential hypertension, benign   2. Persistent atrial fibrillation ([place])   3. Chronic diastolic heart failure ([place])   4. Non-rheumatic tricuspid valve insufficiency    1. Essential hypertension, benign Blood pressure within normal limits today.  Patient states it has been very well controlled at home.  She takes her blood pressure every day. Continue to monitor BP at home and report sustained BP at or > 140/90.  2. Persistent atrial fibrillation (HCC) Heart rate is irregularly irregular today with a rate of 71.  Continue metoprolol XL 75 mg daily. CrCL 42 today. Continue Xarelto 15 mg daily. Get BMET / CBC prior to next visit.  3. Chronic diastolic heart failure (HCC) Recent echocardiogram showed grade 3 diastolic dysfunction with severely elevated  filling pressures.  Continue diuretic therapy.  Advised to continue monitoring weight daily, restrict salt intake, monitor for increased edema.  Patient states she continues to walk around [place] farm as able without significant dyspnea.  4. Non-rheumatic tricuspid valve insufficiency Severe regurgitation on echocardiogram in March 2020.  No complaints of edema, shortness of breath.  Euvolemic today on exam.  Medication Adjustments/Labs and Tests Ordered: Current medicines are reviewed at length with the patient today.  Concerns regarding medicines are outlined above.    Patient Instructions  Medication Instructions:     Labwork:    Testing/Procedures:    Follow-Up:  Your physician recommends that you schedule a follow-up appointment in:   Any Other Special Instructions Will Be Listed Below (If Applicable).  If you need a refill on your cardiac medications before your next appointment, please call your pharmacy.        Signed, Mitzi Hansen  Leonides Sake, NP 05/16/2019 10:17 AM    [place] at [place], [place], [place] [address] Phone: [phone]; Fax: [phone]

## 2019-06-26 DIAGNOSIS — H903 Sensorineural hearing loss, bilateral: Secondary | ICD-10-CM | POA: Diagnosis not present

## 2019-06-26 DIAGNOSIS — H6121 Impacted cerumen, right ear: Secondary | ICD-10-CM | POA: Diagnosis not present

## 2019-06-28 ENCOUNTER — Other Ambulatory Visit: Payer: Self-pay | Admitting: Cardiology

## 2019-09-05 DIAGNOSIS — M810 Age-related osteoporosis without current pathological fracture: Secondary | ICD-10-CM | POA: Diagnosis not present

## 2019-09-12 DIAGNOSIS — Z789 Other specified health status: Secondary | ICD-10-CM | POA: Diagnosis not present

## 2019-09-12 DIAGNOSIS — I4891 Unspecified atrial fibrillation: Secondary | ICD-10-CM | POA: Diagnosis not present

## 2019-09-12 DIAGNOSIS — J449 Chronic obstructive pulmonary disease, unspecified: Secondary | ICD-10-CM | POA: Diagnosis not present

## 2019-09-12 DIAGNOSIS — I5032 Chronic diastolic (congestive) heart failure: Secondary | ICD-10-CM | POA: Diagnosis not present

## 2019-09-12 DIAGNOSIS — Z299 Encounter for prophylactic measures, unspecified: Secondary | ICD-10-CM | POA: Diagnosis not present

## 2019-09-27 ENCOUNTER — Other Ambulatory Visit: Payer: Self-pay | Admitting: Cardiology

## 2019-09-27 DIAGNOSIS — I4819 Other persistent atrial fibrillation: Secondary | ICD-10-CM

## 2019-10-16 ENCOUNTER — Other Ambulatory Visit (HOSPITAL_COMMUNITY): Payer: Self-pay | Admitting: Nurse Practitioner

## 2019-10-16 DIAGNOSIS — Z1231 Encounter for screening mammogram for malignant neoplasm of breast: Secondary | ICD-10-CM

## 2019-10-21 ENCOUNTER — Other Ambulatory Visit: Payer: Self-pay | Admitting: Cardiology

## 2019-10-23 ENCOUNTER — Telehealth: Payer: Self-pay | Admitting: Cardiology

## 2019-10-23 MED ORDER — METOPROLOL SUCCINATE ER 50 MG PO TB24: ORAL_TABLET | ORAL | 1 refills | Status: DC

## 2019-10-23 NOTE — Telephone Encounter (Signed)
According to the chart she is taking 75 mg Toprol XL p.o. twice daily.

## 2019-10-23 NOTE — Telephone Encounter (Signed)
Last office appt in December dictation says continue Toprol XL 75 mg daily med list says 75 mg bid - will confirm with provider on which dose she is supposed to be taking

## 2019-10-23 NOTE — Telephone Encounter (Signed)
Patient called asking if she is suppose to be taking Metoprolol .

## 2019-10-23 NOTE — Telephone Encounter (Signed)
Spoke with pt who confirmed she had been taking Toprol XL 75 mg bid - will send to [facility] as requested

## 2019-11-03 ENCOUNTER — Telehealth: Payer: Self-pay | Admitting: *Deleted

## 2019-11-03 NOTE — Telephone Encounter (Signed)
Patient reminded of lab work due before visit. Patient says she is having lab work for her PCP in June prior to visit at our office and will bring a copy to her visit on 11/17/19. Also bmet and cbc from March 2021 available in care everywhere.

## 2019-11-03 NOTE — Telephone Encounter (Signed)
-----   Message from Merlene Laughter, RN sent at 05/16/2019 10:59 AM EST ----- Regarding: will need lab orders for bmet and cbc when this comes-due before 11/17/2019 visit

## 2019-11-09 DIAGNOSIS — E7800 Pure hypercholesterolemia, unspecified: Secondary | ICD-10-CM | POA: Diagnosis not present

## 2019-11-09 DIAGNOSIS — Z7189 Other specified counseling: Secondary | ICD-10-CM | POA: Diagnosis not present

## 2019-11-09 DIAGNOSIS — Z79899 Other long term (current) drug therapy: Secondary | ICD-10-CM | POA: Diagnosis not present

## 2019-11-09 DIAGNOSIS — Z0000 Encounter for general adult medical examination without abnormal findings: Secondary | ICD-10-CM | POA: Diagnosis not present

## 2019-11-09 DIAGNOSIS — I10 Essential (primary) hypertension: Secondary | ICD-10-CM | POA: Diagnosis not present

## 2019-11-09 DIAGNOSIS — R5383 Other fatigue: Secondary | ICD-10-CM | POA: Diagnosis not present

## 2019-11-09 DIAGNOSIS — M810 Age-related osteoporosis without current pathological fracture: Secondary | ICD-10-CM | POA: Diagnosis not present

## 2019-11-09 DIAGNOSIS — Z1211 Encounter for screening for malignant neoplasm of colon: Secondary | ICD-10-CM | POA: Diagnosis not present

## 2019-11-09 DIAGNOSIS — Z681 Body mass index (BMI) 19 or less, adult: Secondary | ICD-10-CM | POA: Diagnosis not present

## 2019-11-09 DIAGNOSIS — Z299 Encounter for prophylactic measures, unspecified: Secondary | ICD-10-CM | POA: Diagnosis not present

## 2019-11-09 DIAGNOSIS — E039 Hypothyroidism, unspecified: Secondary | ICD-10-CM | POA: Diagnosis not present

## 2019-11-17 ENCOUNTER — Other Ambulatory Visit: Payer: Self-pay

## 2019-11-17 ENCOUNTER — Ambulatory Visit: Payer: Medicare Other | Admitting: Cardiology

## 2019-11-17 ENCOUNTER — Encounter: Payer: Self-pay | Admitting: Cardiology

## 2019-11-17 VITALS — BP 138/80 | HR 80 | Ht 62.0 in | Wt 101.4 lb

## 2019-11-17 DIAGNOSIS — I4819 Other persistent atrial fibrillation: Secondary | ICD-10-CM | POA: Diagnosis not present

## 2019-11-17 DIAGNOSIS — I272 Pulmonary hypertension, unspecified: Secondary | ICD-10-CM | POA: Diagnosis not present

## 2019-11-17 DIAGNOSIS — I5032 Chronic diastolic (congestive) heart failure: Secondary | ICD-10-CM

## 2019-11-17 MED ORDER — RIVAROXABAN 15 MG PO TABS: ORAL_TABLET | ORAL | 0 refills | Status: DC

## 2019-11-17 NOTE — Progress Notes (Signed)
Cardiology Office Note  Date: 11/17/2019   ID: Shelly Fields, DOB 1934-10-31, MRN [id number]  PCP:  Berenice Primas  Cardiologist:  Rozann Lesches, MD Electrophysiologist:  None   Chief Complaint  Patient presents with  . Cardiac follow-up    History of Present Illness: Shelly Fields is an 85 y.o. female last seen in December 2020 Leonides Sake NP.  She presents for a routine visit.  In general doing well, no progressive sense of palpitations and stable dyspnea exertion.  She walks for about 30 to 45 minutes a day [place], also walks with a group of ladies once a week.  She describes NYHA class II-III dyspnea depending on level of activity.  No syncope.  Echocardiogram from March of last year at [facility] as outlined below.  She had normal LVEF at 60 to 65%, but restrictive diastolic filling pattern, and RV dysfunction with at least moderate pulmonary hypertension.  Recent lab work is outlined below.  We went over her medications, she does not report any bleeding problems on Xarelto.  I personally reviewed her ECG today which shows atrial fibrillation with controlled rate.  Past Medical History:  Diagnosis Date  . Cor pulmonale (chronic) ([place])   . Diverticulosis   . Essential hypertension   . Hypothyroidism   . Internal hemorrhoids   . Persistent atrial fibrillation ([place])   . Pulmonary hypertension ([place])   . Seasonal allergies   . Shingles     Past Surgical History:  Procedure Laterality Date  . ABDOMINAL HYSTERECTOMY    . CATARACT EXTRACTION    . COLONOSCOPY N/A 01/12/2013   Procedure: COLONOSCOPY;  Surgeon: Rogene Houston, MD;  [facility];  Service: Endoscopy;  Laterality: N/A;  1030  . COLONOSCOPY N/A 11/18/2017   Procedure: COLONOSCOPY;  Surgeon: Rogene Houston, MD;  [facility];  Service: Endoscopy;  Laterality: N/A;  . POLYPECTOMY  11/18/2017   Procedure: POLYPECTOMY;  Surgeon: Rogene Houston, MD;  [facility];   Service: Endoscopy;;  colon    Current Outpatient Medications  Medication Sig Dispense Refill  . calcium-vitamin D 250-100 MG-UNIT tablet Take 1 tablet by mouth every other day.    . Carboxymethylcellulose Sodium (THERATEARS OP) Place 1 drop into both eyes daily.     Marland Kitchen denosumab (PROLIA) 60 MG/ML SOSY injection Inject 60 mg into the skin every 6 (six) months.    . docusate sodium (COLACE) 100 MG capsule Take 100 mg by mouth at bedtime.     . furosemide (LASIX) 20 MG tablet Take 1 tablet (20 mg total) by mouth daily. For weight gain of 2-3 lbs in 24 hours or 5 lbs in a week, increase to 40 mg daily for few days until weight is returns to baseline 180 tablet 2  . levothyroxine (SYNTHROID, LEVOTHROID) 50 MCG tablet Take 50 mcg by mouth every other day.    . metoprolol succinate (TOPROL-XL) 50 MG 24 hr tablet TAKE 1.5 TABLETS TWICE DAILY 270 tablet 1  . Multiple Vitamins-Minerals (CENTRUM SILVER 50+WOMEN PO) Take 1 tablet by mouth every other day.     . potassium chloride (KLOR-CON) 10 MEQ tablet TAKE 1 TABLET BY MOUTH  DAILY 90 tablet 3  . Rivaroxaban (XARELTO) 15 MG TABS tablet TAKE 1 TABLET BY MOUTH  DAILY WITH SUPPER 28 tablet 0  . senna (SENOKOT) 8.6 MG tablet Take 1 tablet by mouth daily as needed for constipation. Take 1 tablet by mouth  every other night     No current facility-administered medications for this visit.   Allergies:  Patient has no known allergies.   ROS:  Hearing loss.  Physical Exam: VS:  BP 138/80   Pulse 80   Ht 5\' 2"  (1.575 m)   Wt 101 lb 6.4 oz (46 kg)   SpO2 98%   BMI 18.55 kg/m , BMI Body mass index is 18.55 kg/m.  Wt Readings from Last 3 Encounters:  11/17/19 101 lb 6.4 oz (46 kg)  05/16/19 99 lb (44.9 kg)  01/12/19 94 lb 3.2 oz (42.7 kg)    General: Elderly woman, appears comfortable at rest. HEENT: Conjunctiva and lids normal, wearing a mask. Neck: Supple, no elevated JVP or carotid bruits, no thyromegaly. Lungs: Clear to auscultation, nonlabored  breathing at rest. Cardiac: Irregularly irregular, 2/6 apical systolic murmur, no pericardial rub. Abdomen: Soft, bowel sounds present, no guarding or rebound. Extremities: No pitting edema, distal pulses 2+.  ECG:  An ECG dated 08/17/2018 was personally reviewed today and demonstrated:  Rapid atrial fibrillation.  Recent Labwork: 05/10/2019: BUN 14; Creat 0.74; Hemoglobin 12.4; Platelets 222; Potassium 4.6; Sodium 131  June 2021: Hemoglobin 13.7, platelets 178, BUN 13, creatinine 0.85, sodium 131, potassium 4.5, AST 22, ALT 16, cholesterol 131, triglycerides 63, HDL 48, LDL 70, TSH 8.12  Other Studies Reviewed Today:  Echocardiogram 08/17/2018 [facility]): LVEF 60 to 65% with grade 3 diastolic dysfunction, trivial mitral regurgitation, moderate to severe left atrial enlargement, mild aortic regurgitation, trivial pulmonic regurgitation, severe RV dilatation with moderately decreased contraction, severe tricuspid regurgitation with evidence of at least moderate pulmonary hypertension, estimated PASP greater than 45 mmHg, severe right atrial enlargement, trivial pericardial effusion.  Assessment and Plan:  1.  Permanent atrial fibrillation, CHA2DS2-VASc score of at least 4.  Continue Xarelto for stroke prophylaxis, recent lab work reviewed.  She is on Toprol-XL with good heart rate control and no sense of palpitations.  ECG reviewed today.  Check CBC and BMET for next visit.  2.  Chronic diastolic heart failure, also significant RV dysfunction with at least moderate pulmonary hypertension.  She continues on diuretic with stable weights.  We will obtain a follow-up echocardiogram prior to her next visit.  Medication Adjustments/Labs and Tests Ordered: Current medicines are reviewed at length with the patient today.  Concerns regarding medicines are outlined above.   Tests Ordered: Orders Placed This Encounter  Procedures  . EKG 12-Lead  . ECHOCARDIOGRAM COMPLETE    Medication  Changes: Meds ordered this encounter  Medications  . Rivaroxaban (XARELTO) 15 MG TABS tablet    Sig: TAKE 1 TABLET BY MOUTH  DAILY WITH SUPPER    Dispense:  28 tablet    Refill:  0    Lot # [id number] Exp 12/2019    Disposition:  Follow up 6 months in the [place] office.  Signed, Satira Sark, MD, [facility] 11/17/2019 11:27 AM    [place] at [place], [place], [place] [address] Phone: [phone]; Fax: [phone]

## 2019-11-17 NOTE — Patient Instructions (Addendum)
Medication Instructions:    Your physician recommends that you continue on your current medications as directed. Please refer to the Current Medication list given to you today.  Labwork:  Your physician recommends that you return for non-fasting lab work in: 6 months just before your next visit to check your BMET & CBC.  Testing/Procedures: Your physician has requested that you have an echocardiogram in 6 months just before your next visit. Echocardiography is a painless test that uses sound waves to create images of your heart. It provides your doctor with information about the size and shape of your heart and how well your heart's chambers and valves are working. This procedure takes approximately one hour. There are no restrictions for this procedure.  Follow-Up:  Your physician recommends that you schedule a follow-up appointment in: 6 months (office).  Any Other Special Instructions Will Be Listed Below (If Applicable).  If you need a refill on your cardiac medications before your next appointment, please call your pharmacy.

## 2019-11-20 ENCOUNTER — Other Ambulatory Visit: Payer: Self-pay

## 2019-11-20 ENCOUNTER — Ambulatory Visit (HOSPITAL_COMMUNITY)
Admission: RE | Admit: 2019-11-20 | Discharge: 2019-11-20 | Disposition: A | Discharge status: Home / Self Care | Payer: Medicare Other | Source: Ambulatory Visit | Attending: Nurse Practitioner | Admitting: Nurse Practitioner

## 2019-11-20 DIAGNOSIS — Z1231 Encounter for screening mammogram for malignant neoplasm of breast: Secondary | ICD-10-CM | POA: Insufficient documentation

## 2019-11-22 DIAGNOSIS — Z1283 Encounter for screening for malignant neoplasm of skin: Secondary | ICD-10-CM | POA: Diagnosis not present

## 2019-11-22 DIAGNOSIS — Z85828 Personal history of other malignant neoplasm of skin: Secondary | ICD-10-CM | POA: Diagnosis not present

## 2019-11-22 DIAGNOSIS — D225 Melanocytic nevi of trunk: Secondary | ICD-10-CM | POA: Diagnosis not present

## 2019-11-22 DIAGNOSIS — L570 Actinic keratosis: Secondary | ICD-10-CM | POA: Diagnosis not present

## 2019-11-22 DIAGNOSIS — C44729 Squamous cell carcinoma of skin of left lower limb, including hip: Secondary | ICD-10-CM | POA: Diagnosis not present

## 2019-11-22 DIAGNOSIS — Z08 Encounter for follow-up examination after completed treatment for malignant neoplasm: Secondary | ICD-10-CM | POA: Diagnosis not present

## 2019-11-28 ENCOUNTER — Ambulatory Visit (INDEPENDENT_AMBULATORY_CARE_PROVIDER_SITE_OTHER): Payer: Medicare Other

## 2019-11-28 ENCOUNTER — Encounter: Payer: Self-pay | Admitting: Internal Medicine

## 2019-11-28 ENCOUNTER — Other Ambulatory Visit: Payer: Self-pay

## 2019-11-28 ENCOUNTER — Ambulatory Visit: Payer: Medicare Other | Admitting: Internal Medicine

## 2019-11-28 DIAGNOSIS — R05 Cough: Secondary | ICD-10-CM | POA: Diagnosis not present

## 2019-11-28 DIAGNOSIS — R911 Solitary pulmonary nodule: Secondary | ICD-10-CM

## 2019-11-28 DIAGNOSIS — J479 Bronchiectasis, uncomplicated: Secondary | ICD-10-CM | POA: Diagnosis not present

## 2019-11-28 DIAGNOSIS — I517 Cardiomegaly: Secondary | ICD-10-CM | POA: Diagnosis not present

## 2019-11-28 NOTE — Progress Notes (Addendum)
Subjective:     Patient ID: Shelly Fields, female   DOB: 11-19-1934,    MRN: [id number]    Brief patient profile:  22 yowf never smoked started with  Chronic cough  Developed 12/2015 mostly with bending over with mostly yellowish with steaks of blood while xarelto but for the most part no blood and w/u bronchiolectasis with  Dx ? MAI in [place] so referred to pulmonary clinic 03/22/2017 by Nicanor Bake     History of Present Illness  03/22/2017 1st [facility] office visit/ Shelly Fields   Chief Complaint  Patient presents with  . Pulmonary Consult    Referred by Dr. Woody Seller for eval of [facility]. Pt c/o occ dry cough.   most days x 15 months only "spits up" mucus (clears throat "I don't cough" )  in afternoons < tbsp yellowish thick mucus  Not limited by breathing from desired activities   Assoc with voice fatigue rec Pantoprazole (protonix) 40 mg   Take  30-60 min before first meal of the day and Pepcid (famotidine)  20 mg one @  bedtime until return to office - this is the best way to tell whether stomach acid is contributing to your problem.   GERD (REFLUX)  Diet    05/13/2017  f/u ov/Shelly Fields re:   MAI/ cough has resolved on GERD rx  Not limited by breathing from desired activities   No am excess mucus rec No change in your medications until the pantaprazole (protonix)  expires (all the refills)  Then stop it Continue the diet and the pepcid (famotidine) at bedtime and when you run out of the pantaprazole refills then take the pepcid after bfast also    08/11/2017  f/u ov/Shelly Fields re: bronchiolectasis, no worse symptoms off ppi/h2  Chief Complaint  Patient presents with  . Follow-up    Cxr done today. Cough is overall improved. She has occ cough with yellow sputum.    Dyspnea: Not limited by breathing from desired activities   Cough: minimal light yellow/ never bloody/ most notable in am  Sleep: ok s am flare of cough  SABA use:  NONE  rec For cough mucinex 600 mg up to 2 every 12 hours   iff the mucus turns nasty or bloody please call       02/11/2018  f/u ov/Shelly Fields re:  bronchiolectasis? mai  Chief Complaint  Patient presents with  . Follow-up    She only has occ cough- yellow sputum.    Dyspnea:  MMRC1 = can walk nl pace, flat grade, can't hurry or go uphills or steps s sob   Cough: sometimes supine / does not keep her up or flare in am's Sleeping: elevated and rotated R side one pillow  SABA use: none 02: none    rec Bronchiectasis pathyophysiology and rx     11/28/2018  f/u ov/Shelly Fields re: bronchiolectasis  Chief Complaint  Patient presents with  . Follow-up    Breathing is overall doing well. She has a runny nose. Not coughing much.  Dyspnea:  MMRC2 = can't walk a nl pace on a flat grade s sob but does fine slow and flat / more slowed by  Fatigue and palpitations  Cough: none Sleeping: slt elevelation hob > sleeps ok  SABA use: none  02: none   rec f/u yearly    11/28/2019  f/u ov/Shelly Fields re: bronchiolectasis with possible MAI  - s/p second  [facility] 08/04/19  Chief Complaint  Patient presents with  . Follow-up  pts says she doing about the same. Has some brown mucus in the mornings.   Dyspnea:  No change MMRC 2  Cough: variably slt tan Sleeping: sleeping on bed blocks  SABA use: none  02: none   No obvious day to day or daytime variability or assoc mucus plugs or hemoptysis or cp or chest tightness, subjective wheeze or overt sinus or hb symptoms.   Sleeping as above  without nocturnal  or early am exacerbation  of respiratory  c/o's or need for noct saba. Also denies any obvious fluctuation of symptoms with weather or environmental changes or other aggravating or alleviating factors except as outlined above   No unusual exposure hx or h/o childhood pna/ asthma or knowledge of premature birth.  Current Allergies, Complete Past Medical History, Past Surgical History, Family History, and Social History were reviewed in [employer]  record.  ROS  The following are not active complaints unless bolded Hoarseness, sore throat, dysphagia, dental problems, itching, sneezing,  nasal congestion or discharge of excess mucus or purulent secretions, ear ache,   fever, chills, sweats, unintended wt loss or wt gain, classically pleuritic or exertional cp,  orthopnea pnd or arm/hand swelling  or leg swelling, presyncope, palpitations, abdominal pain, anorexia, nausea, vomiting, diarrhea  or change in bowel habits or change in bladder habits, change in stools or change in urine, dysuria, hematuria,  rash, arthralgias, visual complaints, headache, numbness, weakness or ataxia or problems with walking or coordination,  change in mood or  memory.        Current Meds  Medication Sig  . calcium-vitamin D 250-100 MG-UNIT tablet Take 1 tablet by mouth every other day.  . Carboxymethylcellulose Sodium (THERATEARS OP) Place 1 drop into both eyes daily.   Marland Kitchen denosumab (PROLIA) 60 MG/ML SOSY injection Inject 60 mg into the skin every 6 (six) months.  . docusate sodium (COLACE) 100 MG capsule Take 100 mg by mouth at bedtime.   Marland Kitchen levothyroxine (SYNTHROID, LEVOTHROID) 50 MCG tablet Take 50 mcg by mouth every other day.  . metoprolol succinate (TOPROL-XL) 50 MG 24 hr tablet TAKE 1.5 TABLETS TWICE DAILY  . potassium chloride (KLOR-CON) 10 MEQ tablet TAKE 1 TABLET BY MOUTH  DAILY  . Rivaroxaban (XARELTO) 15 MG TABS tablet TAKE 1 TABLET BY MOUTH  DAILY WITH SUPPER  . senna (SENOKOT) 8.6 MG tablet Take 1 tablet by mouth daily as needed for constipation. Take 1 tablet by mouth every other night         Objective:   Physical Exam   pleasant amb wf squeaky voice    11/28/2019       101 11/28/2018        97   02/11/2018          100  08/11/2017         102   05/13/2017      102   03/22/17 101 lb (45.8 kg)  03/18/17 101 lb (45.8 kg)  09/15/16 97 lb 12.8 oz (44.4 kg)      Vital signs reviewed  11/28/2019  - Note at rest 02 sats  100% on RA       HEENT :  pt wearing mask not removed for exam due to covid -19 concerns.    NECK :  without JVD/Nodes/TM/ nl carotid upstrokes bilaterally   LUNGS: no acc muscle use,  slt kyphotic contour chest  With minimal pops/ squeaks bases bilaterally without cough on insp or exp maneuvers  CV:  slt irreg  S1S2  no s3 or murmur or increase in P2, and no edema   ABD:  soft and nontender with nl inspiratory excursion in the supine position. No bruits or organomegaly appreciated, bowel sounds nl  MS:  Nl gait/ ext warm without deformities, calf tenderness, cyanosis or clubbing No obvious joint restrictions   SKIN: warm and dry without lesions    NEURO:  alert, approp, nl sensorium with  no motor or cerebellar deficits apparent.           CXR PA and Lateral:   11/28/2019 :    I personally reviewed images and agree with radiology impression as follows:    Focal slightly nodular 2.0 cm opacity within the medial aspect of the right lung in the infrahilar region appears new from prior. Although findings could reflect an infectious or inflammatory process, a CT chest should be considered to exclude the presence of an underlying nodule.        Assessment:

## 2019-11-28 NOTE — Patient Instructions (Addendum)
No change in medications   Please remember to go to the  x-ray department  for your tests - we will call you with the results when they are available   Please schedule a follow up visit in 12 months but call sooner if needed - we can see you at [facility] office    Add:  .rec levquin 500mg  mg daily x 10 days then repeat cxr in 2 weeks at [facility]

## 2019-11-29 ENCOUNTER — Encounter: Payer: Self-pay | Admitting: Internal Medicine

## 2019-11-29 NOTE — Assessment & Plan Note (Addendum)
Onset of cough 2017  Chest CT  01/06/17  C/w MAI with "bronciolectasis" L > R bases / no real change since  02/11/16  but new findings c/w PH - PFT's  05/13/2017  FEV1 1.54 (85 % ) ratio 64   p nothing prior to study with DLCO  59/63c % corrects to 103 % for alv volume   - CT 08/23/18 no change   This is an extremely common benign condition in the elderly and does not warrant aggressive eval/ rx at this point unless there is a clinical correlation suggesting unaddressed pulmonary infection (purulent sputum, night sweats, unintended wt loss, doe) or evolution of  obvious changes on plain cxr (as opposed to serial CT, which is way over sensitive to make clinical decisions re intervention and treatment in the elderly, who tend to tolerate both dx and treatment poorly) .   No clinical  evidence of worsening dz > But there is a new area of density behind the heart on the R that can't be viz on lateral > rec levquin 500mg  mg daily x 10 days then repeat cxr in 2 weeks    Discussed in detail all the  indications, usual  risks and alternatives  relative to the benefits with patient who agrees to proceed with Rx as outlined.           Each maintenance medication was reviewed in detail including emphasizing most importantly the difference between maintenance and prns and under what circumstances the prns are to be triggered using an action plan format where appropriate.  Total time for H and P, chart review, counseling, teaching device and generating customized AVS unique to this office visit / charting = 20 min

## 2019-12-05 MED ORDER — LEVOFLOXACIN 500 MG PO TABS
500.0000 mg | ORAL_TABLET | Freq: Every day | ORAL | 0 refills | Status: AC
Start: 2019-12-05 — End: 2019-12-15

## 2019-12-05 NOTE — Progress Notes (Signed)
LMTCB x 1 

## 2019-12-13 ENCOUNTER — Telehealth: Payer: Self-pay | Admitting: Internal Medicine

## 2019-12-13 NOTE — Telephone Encounter (Signed)
Im not sure of how to do this for the patient I will send to triage

## 2019-12-13 NOTE — Telephone Encounter (Signed)
   Patient Instructions by Tanda Rockers, MD at 11/28/2019 10:30 AM Author: Tanda Rockers, MD Author Type: Physician Filed: 12/05/2019 5:27 AM  Note Status: Addendum Cosign: Cosign Not Required Encounter Date: 11/28/2019  Editor: Tanda Rockers, MD (Physician)      Prior Versions: 1. Tanda Rockers, MD (Physician) at 11/28/2019 11:03 AM - Addendum   2. Tanda Rockers, MD (Physician) at 11/28/2019 11:00 AM - Signed      No change in medications   Please remember to go to the  x-ray department  for your tests - we will call you with the results when they are available   Please schedule a follow up visit in 12 months but call sooner if needed - we can see you at [facility] office    Add:  .rec levquin 500mg  mg daily x 10 days then repeat cxr in 2 weeks at [facility] and spoke with pt. Pt stated she will be taking last dose of abx tomorrow 7/8. Stated to pt that she could go to [facility] for cxr and she verbalized understanding. Nothing further needed.

## 2019-12-18 ENCOUNTER — Other Ambulatory Visit: Payer: Self-pay

## 2019-12-18 ENCOUNTER — Ambulatory Visit (HOSPITAL_COMMUNITY)
Admission: RE | Admit: 2019-12-18 | Discharge: 2019-12-18 | Disposition: A | Discharge status: Home / Self Care | Payer: Medicare Other | Source: Ambulatory Visit | Attending: Internal Medicine | Admitting: Internal Medicine

## 2019-12-18 DIAGNOSIS — J9811 Atelectasis: Secondary | ICD-10-CM | POA: Diagnosis not present

## 2019-12-18 DIAGNOSIS — J479 Bronchiectasis, uncomplicated: Secondary | ICD-10-CM | POA: Diagnosis not present

## 2019-12-18 DIAGNOSIS — I517 Cardiomegaly: Secondary | ICD-10-CM | POA: Diagnosis not present

## 2019-12-18 DIAGNOSIS — R911 Solitary pulmonary nodule: Secondary | ICD-10-CM | POA: Diagnosis not present

## 2019-12-18 DIAGNOSIS — J90 Pleural effusion, not elsewhere classified: Secondary | ICD-10-CM | POA: Diagnosis not present

## 2019-12-19 ENCOUNTER — Telehealth: Payer: Self-pay | Admitting: Internal Medicine

## 2019-12-19 ENCOUNTER — Other Ambulatory Visit: Payer: Self-pay | Admitting: Internal Medicine

## 2019-12-19 DIAGNOSIS — J479 Bronchiectasis, uncomplicated: Secondary | ICD-10-CM

## 2019-12-19 NOTE — Progress Notes (Signed)
Spoke with pt and notified of results per Dr. Wert. Pt verbalized understanding and denied any questions. 

## 2019-12-19 NOTE — Telephone Encounter (Signed)
Patient contacted with CXR results, see result notes.

## 2019-12-19 NOTE — Telephone Encounter (Signed)
Called and spoke with pt letting her know the date of her CT scan and she verbalized understanding. Nothing further needed.

## 2019-12-25 DIAGNOSIS — Z08 Encounter for follow-up examination after completed treatment for malignant neoplasm: Secondary | ICD-10-CM | POA: Diagnosis not present

## 2019-12-25 DIAGNOSIS — Z85828 Personal history of other malignant neoplasm of skin: Secondary | ICD-10-CM | POA: Diagnosis not present

## 2019-12-27 ENCOUNTER — Other Ambulatory Visit: Payer: Self-pay

## 2019-12-27 ENCOUNTER — Ambulatory Visit (HOSPITAL_COMMUNITY)
Admission: RE | Admit: 2019-12-27 | Discharge: 2019-12-27 | Disposition: A | Discharge status: Home / Self Care | Payer: Medicare Other | Source: Ambulatory Visit | Attending: Internal Medicine | Admitting: Internal Medicine

## 2019-12-27 DIAGNOSIS — I700 Atherosclerosis of aorta: Secondary | ICD-10-CM | POA: Diagnosis not present

## 2019-12-27 DIAGNOSIS — J984 Other disorders of lung: Secondary | ICD-10-CM | POA: Diagnosis not present

## 2019-12-27 DIAGNOSIS — J479 Bronchiectasis, uncomplicated: Secondary | ICD-10-CM | POA: Diagnosis not present

## 2019-12-27 DIAGNOSIS — S2220XD Unspecified fracture of sternum, subsequent encounter for fracture with routine healing: Secondary | ICD-10-CM | POA: Diagnosis not present

## 2019-12-29 ENCOUNTER — Telehealth: Payer: Self-pay | Admitting: Internal Medicine

## 2019-12-29 NOTE — Telephone Encounter (Signed)
See results note. 

## 2019-12-29 NOTE — Progress Notes (Signed)
LMTCB

## 2019-12-29 NOTE — Progress Notes (Signed)
Spoke with pt and notified of results per Dr. Wert. Pt verbalized understanding and denied any questions. 

## 2020-01-10 DIAGNOSIS — E039 Hypothyroidism, unspecified: Secondary | ICD-10-CM | POA: Diagnosis not present

## 2020-01-11 DIAGNOSIS — Z299 Encounter for prophylactic measures, unspecified: Secondary | ICD-10-CM | POA: Diagnosis not present

## 2020-01-11 DIAGNOSIS — Z681 Body mass index (BMI) 19 or less, adult: Secondary | ICD-10-CM | POA: Diagnosis not present

## 2020-01-11 DIAGNOSIS — E039 Hypothyroidism, unspecified: Secondary | ICD-10-CM | POA: Diagnosis not present

## 2020-01-11 DIAGNOSIS — E440 Moderate protein-calorie malnutrition: Secondary | ICD-10-CM | POA: Diagnosis not present

## 2020-01-11 DIAGNOSIS — I10 Essential (primary) hypertension: Secondary | ICD-10-CM | POA: Diagnosis not present

## 2020-01-11 DIAGNOSIS — I4891 Unspecified atrial fibrillation: Secondary | ICD-10-CM | POA: Diagnosis not present

## 2020-01-11 DIAGNOSIS — J449 Chronic obstructive pulmonary disease, unspecified: Secondary | ICD-10-CM | POA: Diagnosis not present

## 2020-01-14 ENCOUNTER — Other Ambulatory Visit: Payer: Self-pay | Admitting: Cardiology

## 2020-01-23 DIAGNOSIS — H43813 Vitreous degeneration, bilateral: Secondary | ICD-10-CM | POA: Diagnosis not present

## 2020-02-13 ENCOUNTER — Other Ambulatory Visit: Payer: Self-pay

## 2020-02-13 ENCOUNTER — Ambulatory Visit (INDEPENDENT_AMBULATORY_CARE_PROVIDER_SITE_OTHER): Payer: Medicare Other | Admitting: Internal Medicine

## 2020-02-13 ENCOUNTER — Encounter: Payer: Self-pay | Admitting: Internal Medicine

## 2020-02-13 DIAGNOSIS — J479 Bronchiectasis, uncomplicated: Secondary | ICD-10-CM

## 2020-02-13 MED ORDER — LEVOFLOXACIN 500 MG PO TABS
500.0000 mg | ORAL_TABLET | Freq: Every day | ORAL | 0 refills | Status: DC
Start: 2020-02-13 — End: 2020-05-27

## 2020-02-13 NOTE — Patient Instructions (Signed)
If the mucus is getting nastier or more plentiful > levaquin 500 mg daily x 10 days - ok to call for refills  Take vaccines at least a week apart (for example flu or booster for Covid)  Please schedule a follow up visit in 6 months but call sooner if needed

## 2020-02-13 NOTE — Progress Notes (Signed)
Subjective:     Patient ID: Shelly Fields, female   DOB: [birth year],    MRN: [id number]    Brief patient profile:  17 yowf never smoked started with  Chronic cough  Developed 12/2015 mostly with bending over with mostly yellowish with steaks of blood while xarelto but for the most part no blood and w/u bronchiolectasis with  Dx ? MAI in [place] so referred to pulmonary clinic 03/22/2017 by Nicanor Bake     History of Present Illness  03/22/2017 1st [facility] office visit/ [name]   Chief Complaint  Patient presents with   Pulmonary Consult    Referred by Dr. Woody Seller for eval of [facility]. Pt c/o occ dry cough.   most days x 15 months only "spits up" mucus (clears throat "I don't cough" )  in afternoons < tbsp yellowish thick mucus  Not limited by breathing from desired activities   Assoc with voice fatigue rec Pantoprazole (protonix) 40 mg   Take  30-60 min before first meal of the day and Pepcid (famotidine)  20 mg one @  bedtime until return to office - this is the best way to tell whether stomach acid is contributing to your problem.   GERD (REFLUX)  Diet    05/13/2017  f/u ov/[name] re:   MAI/ cough has resolved on GERD rx  Not limited by breathing from desired activities   No am excess mucus rec No change in your medications until the pantaprazole (protonix)  expires (all the refills)  Then stop it Continue the diet and the pepcid (famotidine) at bedtime and when you run out of the pantaprazole refills then take the pepcid after bfast also    08/11/2017  f/u ov/[name] re: bronchiolectasis, no worse symptoms off ppi/h2  Chief Complaint  Patient presents with   Follow-up    Cxr done today. Cough is overall improved. She has occ cough with yellow sputum.    Dyspnea: Not limited by breathing from desired activities   Cough: minimal light yellow/ never bloody/ most notable in am  Sleep: ok s am flare of cough  SABA use:  NONE  rec For cough mucinex 600 mg up to 2 every 12 hours   iff the mucus turns nasty or bloody please call       02/11/2018  f/u ov/[name] re:  bronchiolectasis? mai  Chief Complaint  Patient presents with   Follow-up    She only has occ cough- yellow sputum.    Dyspnea:  MMRC1 = can walk nl pace, flat grade, can't hurry or go uphills or steps s sob   Cough: sometimes supine / does not keep her up or flare in am's Sleeping: elevated and rotated R side one pillow  SABA use: none 02: none    rec Bronchiectasis pathyophysiology and rx    11/28/2019  f/u ov/[name] re: bronchiolectasis with possible MAI  - s/p second  moderna 08/04/19  Chief Complaint  Patient presents with   Follow-up    pts says she doing about the same. Has some brown mucus in the mornings.   Dyspnea:  No change MMRC 2  Cough: variably slt tan Sleeping: sleeping on bed blocks  SABA use: none  02: none rec No change in medications      02/13/2020  f/u ov/[facility]/[name] re: bronchiectasis  Chief Complaint  Patient presents with   Follow-up    Breathing has overall been doing well. She has some brown sputum- minimal some mornings.  Dyspnea:  MMRC1 = can walk nl pace, flat grade, can't hurry or go uphills or steps s sob   Cough: better overall / min darker just in am sev x a week Sleeping: bed blocks ok  SABA use: no inhalers 02: none   No obvious day to day or daytime variability or assoc excess  sputum or mucus plugs or hemoptysis or cp or chest tightness, subjective wheeze or overt sinus or hb symptoms.   Sleeping  without nocturnal   exacerbation  of respiratory  c/o's or need for noct saba. Also denies any obvious fluctuation of symptoms with weather or environmental changes or other aggravating or alleviating factors except as outlined above.  No unusual exposure hx or h/o childhood pna/ asthma or knowledge of premature birth.  Current Allergies, Complete Past Medical History, Past Surgical History, Family History, and Social History were reviewed in  [employer] record.  ROS  The following are not active complaints unless bolded Hoarseness, sore throat, dysphagia, dental problems, itching, sneezing,  nasal congestion or discharge of excess mucus or purulent secretions, ear ache,   fever, chills, sweats, unintended wt loss or wt gain, classically pleuritic or exertional cp,  orthopnea pnd or arm/hand swelling  or leg swelling, presyncope, palpitations, abdominal pain, anorexia, nausea, vomiting, diarrhea  or change in bowel habits or change in bladder habits, change in stools or change in urine, dysuria, hematuria,  rash, arthralgias, visual complaints, headache, numbness, weakness or ataxia or problems with walking or coordination,  change in mood or  memory.        Current Meds  Medication Sig   calcium-vitamin D 250-100 MG-UNIT tablet Take 1 tablet by mouth every other day.   Carboxymethylcellulose Sodium (THERATEARS OP) Place 1 drop into both eyes daily.    denosumab (PROLIA) 60 MG/ML SOSY injection Inject 60 mg into the skin every 6 (six) months.   docusate sodium (COLACE) 100 MG capsule Take 100 mg by mouth at bedtime.    furosemide (LASIX) 20 MG tablet TAKE 1 TABLET DAILY, AND  INCREASE TO 2 TABLETS DAILY FOR A FEW DAYS FOR WEIGHT  GAIN 2 TO 3 LBS IN 24 HOURS OR 5 LBS IN A WEEK.   levothyroxine (SYNTHROID, LEVOTHROID) 50 MCG tablet Take 50 mcg by mouth daily before breakfast.    metoprolol succinate (TOPROL-XL) 50 MG 24 hr tablet TAKE 1.5 TABLETS TWICE DAILY   Multiple Vitamins-Minerals (CENTRUM SILVER 50+WOMEN PO) Take 1 tablet by mouth every other day.    potassium chloride (KLOR-CON) 10 MEQ tablet TAKE 1 TABLET BY MOUTH  DAILY   Rivaroxaban (XARELTO) 15 MG TABS tablet TAKE 1 TABLET BY MOUTH  DAILY WITH SUPPER   senna (SENOKOT) 8.6 MG tablet Take 1 tablet by mouth daily as needed for constipation. Take 1 tablet by mouth every other night                Objective:   Physical Exam  Pleasant amb  wf nad / very hard of hearing    02/13/2020          100 11/28/2019       101 11/28/2018        97   02/11/2018          100  08/11/2017         102   05/13/2017      102   03/22/17 101 lb (45.8 kg)  03/18/17 101 lb (45.8 kg)  09/15/16 97 lb 12.8  oz (44.4 kg)     Vital signs reviewed  02/13/2020  - Note at rest 02 sats  99% on RA         slt kyphotic contour chest  With minimal pops/ squeaks bases bilaterally     HEENT : pt wearing mask not removed for exam due to covid -19 concerns.    NECK :  without JVD/Nodes/TM/ nl carotid upstrokes bilaterally   LUNGS: no acc muscle use,  Mild  kyphotic contour chest with minimal squeaks/ pops bases  bilaterally without cough on insp or exp maneuvers   CV:  RRR  no s3 or murmur or increase in P2, and no edema   ABD:  soft and nontender with nl inspiratory excursion in the supine position. No bruits or organomegaly appreciated, bowel sounds nl  MS:  Nl gait/ ext warm without deformities, calf tenderness, cyanosis or clubbing No obvious joint restrictions   SKIN: warm and dry without lesions    NEURO:  alert, approp, nl sensorium with  no motor or cerebellar deficits apparent.                I personally reviewed images and agree with radiology impression as follows:   Chest CT 12/27/19 1. Similar appearance of cylindrical bronchiectasis in the right middle lobe with associated airway impaction and clustered micro nodularity. 2. Similar appearance of cylindrical bronchiectasis in the left lower lobe although there is more pronounced airway impaction in the posterior left lower lobe today than on the previous exam. 3. Interval development of a 7 mm anterior left upper lobe pulmonary nodule in the region of clustered micro nodularity associated with a new 5 mm perifissural nodule in the right upper lobe along the minor fissure        Assessment:

## 2020-02-13 NOTE — Assessment & Plan Note (Signed)
Onset of cough 2017  Chest CT  01/06/17  C/w MAI with "bronciolectasis" L > R bases / no real change since  02/11/16  but new findings c/w PH - PFT's  05/13/2017  FEV1 1.54 (85 % ) ratio 64   p nothing prior to study with DLCO  59/63c % corrects to 103 % for alv volume   - CT 08/23/18 no change  - CT chest 12/28/2019 1. Similar appearance of cylindrical bronchiectasis in the right middle lobe with associated airway impaction and clustered micro nodularity. 2. Similar appearance of cylindrical bronchiectasis in the left lower lobe although there is more pronounced airway impaction in the posterior left lower lobe today than on the previous exam. 3. Interval development of a 7 mm anterior left upper lobe pulmonary nodule in the region of clustered micro nodularity associated with a new 5 mm perifissural nodule in the right upper lobe along the minor fissure.   This is an extremely common benign condition in the elderly and does not warrant aggressive eval/ rx at this point unless there is a clinical correlation suggesting unaddressed pulmonary infection (purulent sputum, night sweats, unintended wt loss, doe) or evolution of  obvious changes on plain cxr (as opposed to serial CT, which is way over sensitive to make clinical decisions re intervention and treatment in the elderly, who tend to tolerate both dx and treatment poorly) .   rec repeat cxr in 6  M,  rx acute flares with levaquin 500 mg x 10 day cycles    Discussed in detail all the  indications, usual  risks and alternatives  relative to the benefits with patient who agrees to proceed with conservative rx andf/u as outlined           Each maintenance medication was reviewed in detail including emphasizing most importantly the difference between maintenance and prns and under what circumstances the prns are to be triggered using an action plan format where appropriate.  Total time for H and P, chart review, counseling, teaching device and  generating customized AVS unique to this office visit / charting = 20 min

## 2020-02-14 DIAGNOSIS — M810 Age-related osteoporosis without current pathological fracture: Secondary | ICD-10-CM | POA: Insufficient documentation

## 2020-02-15 DIAGNOSIS — M810 Age-related osteoporosis without current pathological fracture: Secondary | ICD-10-CM | POA: Diagnosis not present

## 2020-02-25 ENCOUNTER — Other Ambulatory Visit: Payer: Self-pay | Admitting: Cardiology

## 2020-04-11 DIAGNOSIS — E039 Hypothyroidism, unspecified: Secondary | ICD-10-CM | POA: Diagnosis not present

## 2020-04-12 DIAGNOSIS — I4891 Unspecified atrial fibrillation: Secondary | ICD-10-CM | POA: Diagnosis not present

## 2020-04-12 DIAGNOSIS — I5032 Chronic diastolic (congestive) heart failure: Secondary | ICD-10-CM | POA: Diagnosis not present

## 2020-04-12 DIAGNOSIS — Z299 Encounter for prophylactic measures, unspecified: Secondary | ICD-10-CM | POA: Diagnosis not present

## 2020-04-12 DIAGNOSIS — E039 Hypothyroidism, unspecified: Secondary | ICD-10-CM | POA: Diagnosis not present

## 2020-04-12 DIAGNOSIS — J449 Chronic obstructive pulmonary disease, unspecified: Secondary | ICD-10-CM | POA: Diagnosis not present

## 2020-05-14 ENCOUNTER — Encounter: Payer: Self-pay | Admitting: *Deleted

## 2020-05-14 ENCOUNTER — Telehealth: Payer: Self-pay | Admitting: *Deleted

## 2020-05-14 DIAGNOSIS — I4819 Other persistent atrial fibrillation: Secondary | ICD-10-CM

## 2020-05-14 DIAGNOSIS — Z79899 Other long term (current) drug therapy: Secondary | ICD-10-CM

## 2020-05-14 NOTE — Telephone Encounter (Signed)
Orders for bmet and cbc placed will give to patient at office on 05/22/20 for echo visit.

## 2020-05-17 DIAGNOSIS — Z79899 Other long term (current) drug therapy: Secondary | ICD-10-CM | POA: Diagnosis not present

## 2020-05-17 DIAGNOSIS — I4819 Other persistent atrial fibrillation: Secondary | ICD-10-CM | POA: Diagnosis not present

## 2020-05-17 LAB — CBC
HCT: 40.6 % (ref 35.0–45.0)
Hemoglobin: 13 g/dL (ref 11.7–15.5)
MCH: 27.9 pg (ref 27.0–33.0)
MCHC: 32 g/dL (ref 32.0–36.0)
MCV: 87.1 fL (ref 80.0–100.0)
MPV: 10 fL (ref 7.5–12.5)
Platelets: 273 10*3/uL (ref 140–400)
RBC: 4.66 10*6/uL (ref 3.80–5.10)
RDW: 11.8 % (ref 11.0–15.0)
WBC: 6.6 10*3/uL (ref 3.8–10.8)

## 2020-05-17 LAB — BASIC METABOLIC PANEL
BUN: 16 mg/dL (ref 7–25)
CO2: 27 mmol/L (ref 20–32)
Calcium: 9.6 mg/dL (ref 8.6–10.4)
Chloride: 95 mmol/L — ABNORMAL LOW (ref 98–110)
Creat: 0.81 mg/dL (ref 0.60–0.88)
Glucose, Bld: 82 mg/dL (ref 65–139)
Potassium: 4.7 mmol/L (ref 3.5–5.3)
Sodium: 130 mmol/L — ABNORMAL LOW (ref 135–146)

## 2020-05-20 ENCOUNTER — Telehealth: Payer: Self-pay | Admitting: *Deleted

## 2020-05-20 NOTE — Telephone Encounter (Signed)
-----   Message from Satira Sark, MD sent at 05/19/2020  9:59 AM EST ----- Results reviewed.  Hyponatremia is chronic.  Renal function normal as is hemoglobin.  Continue with current medications and follow-up plan.

## 2020-05-20 NOTE — Telephone Encounter (Signed)
Patient informed. Copy sent to PCP °

## 2020-05-22 ENCOUNTER — Ambulatory Visit (INDEPENDENT_AMBULATORY_CARE_PROVIDER_SITE_OTHER): Payer: Medicare Other

## 2020-05-22 DIAGNOSIS — I272 Pulmonary hypertension, unspecified: Secondary | ICD-10-CM | POA: Diagnosis not present

## 2020-05-22 LAB — ECHOCARDIOGRAM COMPLETE
AR max vel: 1.68 cm2
AV Area VTI: 1.67 cm2
AV Area mean vel: 1.64 cm2
AV Mean grad: 1.6 mmHg
AV Peak grad: 3 mmHg
Ao pk vel: 0.87 m/s
Area-P 1/2: 4.76 cm2
Calc EF: 71.2 %
MV M vel: 3.37 m/s
MV Peak grad: 45.5 mmHg
P 1/2 time: 1099 msec
S' Lateral: 2.7 cm
Single Plane A2C EF: 72.6 %
Single Plane A4C EF: 66.5 %

## 2020-05-23 ENCOUNTER — Telehealth: Payer: Self-pay | Admitting: *Deleted

## 2020-05-23 NOTE — Telephone Encounter (Signed)
-----   Message from Satira Sark, MD sent at 05/22/2020  4:58 PM EST ----- Results reviewed.  LVEF normal at 60 to 65%.  Significant RV enlargement as noted previously with biatrial enlargement and borderline elevation in pulmonary pressures.  Continue with current medications and follow-up plan.

## 2020-05-23 NOTE — Telephone Encounter (Signed)
Patient informed. Copy sent to PCP °

## 2020-05-27 ENCOUNTER — Ambulatory Visit (INDEPENDENT_AMBULATORY_CARE_PROVIDER_SITE_OTHER): Payer: Medicare Other | Admitting: Cardiology

## 2020-05-27 ENCOUNTER — Encounter: Payer: Self-pay | Admitting: Cardiology

## 2020-05-27 VITALS — BP 150/82 | HR 92 | Ht 62.0 in | Wt 101.0 lb

## 2020-05-27 DIAGNOSIS — I5032 Chronic diastolic (congestive) heart failure: Secondary | ICD-10-CM

## 2020-05-27 DIAGNOSIS — I4819 Other persistent atrial fibrillation: Secondary | ICD-10-CM | POA: Diagnosis not present

## 2020-05-27 MED ORDER — RIVAROXABAN 15 MG PO TABS: ORAL_TABLET | ORAL | 0 refills | Status: DC

## 2020-05-27 NOTE — Patient Instructions (Addendum)

## 2020-05-27 NOTE — Progress Notes (Signed)
Cardiology Office Note  Date: 05/27/2020   ID: Nola, Botkins [birth year], MRN [id number]  PCP:  Berenice Primas  Cardiologist:  Rozann Lesches, MD Electrophysiologist:  None   Chief Complaint  Patient presents with  . Cardiac follow-up    History of Present Illness: Shelly Fields is an 85 y.o. female last seen in June.  She presents for a routine visit.  Reports no progressive sense of palpitations or increasing shortness of breath with typical activities.  Her weight has been stable and she denies any significant leg swelling or increasing abdominal girth on current diuretic regimen.  I reviewed her recent lab work and echocardiogram as noted below.  We went over these results today as well.  I reviewed her medications, she does not report any spontaneous bleeding problems on Xarelto.  Past Medical History:  Diagnosis Date  . Cor pulmonale (chronic) ([place])   . Diverticulosis   . Essential hypertension   . Hypothyroidism   . Internal hemorrhoids   . Persistent atrial fibrillation ([place])   . Pulmonary hypertension ([place])   . Seasonal allergies   . Shingles     Past Surgical History:  Procedure Laterality Date  . ABDOMINAL HYSTERECTOMY    . CATARACT EXTRACTION    . COLONOSCOPY N/A 01/12/2013   Procedure: COLONOSCOPY;  Surgeon: Rogene Houston, MD;  Location: [facility] ENDO SUITE;  Service: Endoscopy;  Laterality: N/A;  1030  . COLONOSCOPY N/A 11/18/2017   Procedure: COLONOSCOPY;  Surgeon: Rogene Houston, MD;  Location: [facility] END[facility] SUITE;  Service: Endoscopy;  Laterality: N/A;  . POLYPECTOMY  11/18/2017   Procedure: POLYPECTOMY;  Surgeon: Rogene Houston, MD;  Location: [facility] SUITE;  Service: Endoscopy;;  colon    Current Outpatient Medications  Medication Sig Dispense Refill  . calcium-vitamin D 250-100 MG-UNIT tablet Take 1 tablet by mouth every other day.    . Carboxymethylcellulose Sodium (THERATEARS OP) Place 1 drop into both eyes daily.     Marland Kitchen denosumab  (PROLIA) 60 MG/ML SOSY injection Inject 60 mg into the skin every 6 (six) months.    . docusate sodium (COLACE) 100 MG capsule Take 100 mg by mouth at bedtime.     . furosemide (LASIX) 20 MG tablet TAKE 1 TABLET DAILY, AND  INCREASE TO 2 TABLETS DAILY FOR A FEW DAYS FOR WEIGHT  GAIN 2 TO 3 LBS IN 24 HOURS OR 5 LBS IN A WEEK. 180 tablet 3  . levothyroxine (SYNTHROID, LEVOTHROID) 50 MCG tablet Take 50 mcg by mouth daily before breakfast.     . metoprolol succinate (TOPROL-XL) 50 MG 24 hr tablet TAKE 1 AND 1/2 TABLETS BY  MOUTH TWICE DAILY 270 tablet 1  . Multiple Vitamins-Minerals (CENTRUM SILVER 50+WOMEN PO) Take 1 tablet by mouth every other day.     . potassium chloride (KLOR-CON) 10 MEQ tablet TAKE 1 TABLET BY MOUTH  DAILY 90 tablet 3  . senna (SENOKOT) 8.6 MG tablet Take 1 tablet by mouth daily as needed for constipation. Take 1 tablet by mouth every other night    . Rivaroxaban (XARELTO) 15 MG TABS tablet TAKE 1 TABLET BY MOUTH  DAILY WITH SUPPER 28 tablet 0   No current facility-administered medications for this visit.   Allergies:  Patient has no known allergies.   ROS: Hearing loss.  Physical Exam: VS:  BP (!) 150/82   Pulse 92   Ht 5\' 2"  (1.575 m)   Wt 101 lb (45.8 kg)  SpO2 95%   BMI 18.47 kg/m , BMI Body mass index is 18.47 kg/m.  Wt Readings from Last 3 Encounters:  05/27/20 101 lb (45.8 kg)  02/13/20 100 lb 6.4 oz (45.5 kg)  11/28/19 101 lb 6.4 oz (46 kg)    General: Elderly woman, appears comfortable at rest. HEENT: Conjunctiva and lids normal, wearing a mask. Neck: Supple, no elevated JVP or carotid bruits, no thyromegaly. Lungs: Clear to auscultation, nonlabored breathing at rest. Cardiac: Irregularly irregular, 2/6 systolic murmur, no gallop. Extremities: No pitting edema.  ECG:  An ECG dated 11/17/2019 was personally reviewed today and demonstrated:  Atrial fibrillation with controlled rate.  Recent Labwork: 05/17/2020: BUN 16; Creat 0.81; Hemoglobin 13.0;  Platelets 273; Potassium 4.7; Sodium 130   Other Studies Reviewed Today:  Echocardiogram 05/22/2020: 1. Left ventricular ejection fraction, by estimation, is 60 to 65%. The  left ventricle has normal function. The left ventricle has no regional  wall motion abnormalities. Left ventricular diastolic parameters are  indeterminate.  2. Right ventricular systolic function is low normal. The right  ventricular size is severely enlarged.  3. Left atrial size was severely dilated.  4. Right atrial size was severely dilated.  5. The pericardial effusion is circumferential.  6. The mitral valve is normal in structure. No evidence of mitral valve  regurgitation. No evidence of mitral stenosis.  7. The tricuspid valve is abnormal. Tricuspid valve regurgitation is  severe.  8. The aortic valve is tricuspid. Aortic valve regurgitation is mild. No  aortic stenosis is present.  9. Borderline pulmonary HTN, PASP is 30 mmHg.  10. The inferior vena cava is dilated in size with <50% respiratory  variability, suggesting right atrial pressure of 15 mmHg.   Assessment and Plan:  1.  Permanent atrial fibrillation.  CHA2DS2-VASc score is at least 4.  She is doing well without progressive palpitations, continues on Toprol-XL for heart rate control.  She is also on renally adjusted Xarelto 15 mg daily.  No spontaneous bleeding problems.  2.  Chronic diastolic heart failure and RV dysfunction.  RVSP only minimally increased by most recent follow-up echocardiogram.  LVEF remains normal at 60 to 65%.  Continue with present Lasix dosing.  Medication Adjustments/Labs and Tests Ordered: Current medicines are reviewed at length with the patient today.  Concerns regarding medicines are outlined above.   Tests Ordered: No orders of the defined types were placed in this encounter.   Medication Changes: Meds ordered this encounter  Medications  . Rivaroxaban (XARELTO) 15 MG TABS tablet    Sig: TAKE 1  TABLET BY MOUTH  DAILY WITH SUPPER    Dispense:  28 tablet    Refill:  0    Lot # [id number] Exp 06/2020    Disposition:  Follow up 6 months in the [place] office.  Signed, Satira Sark, MD, [facility] 05/27/2020 1:24 PM    [facility] [place], [place], [place] [address] Phone: [phone]; Fax: [phone]

## 2020-06-19 DIAGNOSIS — H903 Sensorineural hearing loss, bilateral: Secondary | ICD-10-CM | POA: Diagnosis not present

## 2020-07-01 DIAGNOSIS — L821 Other seborrheic keratosis: Secondary | ICD-10-CM | POA: Diagnosis not present

## 2020-07-01 DIAGNOSIS — L308 Other specified dermatitis: Secondary | ICD-10-CM | POA: Diagnosis not present

## 2020-07-01 DIAGNOSIS — Z08 Encounter for follow-up examination after completed treatment for malignant neoplasm: Secondary | ICD-10-CM | POA: Diagnosis not present

## 2020-07-01 DIAGNOSIS — L570 Actinic keratosis: Secondary | ICD-10-CM | POA: Diagnosis not present

## 2020-07-01 DIAGNOSIS — Z85828 Personal history of other malignant neoplasm of skin: Secondary | ICD-10-CM | POA: Diagnosis not present

## 2020-07-01 DIAGNOSIS — X32XXXD Exposure to sunlight, subsequent encounter: Secondary | ICD-10-CM | POA: Diagnosis not present

## 2020-07-09 ENCOUNTER — Other Ambulatory Visit: Payer: Self-pay | Admitting: Family Medicine

## 2020-07-29 DIAGNOSIS — E039 Hypothyroidism, unspecified: Secondary | ICD-10-CM | POA: Diagnosis not present

## 2020-07-30 DIAGNOSIS — I4891 Unspecified atrial fibrillation: Secondary | ICD-10-CM | POA: Diagnosis not present

## 2020-07-30 DIAGNOSIS — J479 Bronchiectasis, uncomplicated: Secondary | ICD-10-CM | POA: Diagnosis not present

## 2020-07-30 DIAGNOSIS — J449 Chronic obstructive pulmonary disease, unspecified: Secondary | ICD-10-CM | POA: Diagnosis not present

## 2020-07-30 DIAGNOSIS — Z299 Encounter for prophylactic measures, unspecified: Secondary | ICD-10-CM | POA: Diagnosis not present

## 2020-07-30 DIAGNOSIS — R1903 Right lower quadrant abdominal swelling, mass and lump: Secondary | ICD-10-CM | POA: Diagnosis not present

## 2020-07-30 DIAGNOSIS — I10 Essential (primary) hypertension: Secondary | ICD-10-CM | POA: Diagnosis not present

## 2020-08-08 DIAGNOSIS — Z9049 Acquired absence of other specified parts of digestive tract: Secondary | ICD-10-CM | POA: Diagnosis not present

## 2020-08-08 DIAGNOSIS — N200 Calculus of kidney: Secondary | ICD-10-CM | POA: Diagnosis not present

## 2020-08-08 DIAGNOSIS — R1031 Right lower quadrant pain: Secondary | ICD-10-CM | POA: Diagnosis not present

## 2020-08-08 DIAGNOSIS — K5730 Diverticulosis of large intestine without perforation or abscess without bleeding: Secondary | ICD-10-CM | POA: Diagnosis not present

## 2020-08-15 DIAGNOSIS — M810 Age-related osteoporosis without current pathological fracture: Secondary | ICD-10-CM | POA: Diagnosis not present

## 2020-09-02 DIAGNOSIS — R319 Hematuria, unspecified: Secondary | ICD-10-CM | POA: Diagnosis not present

## 2020-09-02 DIAGNOSIS — I10 Essential (primary) hypertension: Secondary | ICD-10-CM | POA: Diagnosis not present

## 2020-09-02 DIAGNOSIS — Z299 Encounter for prophylactic measures, unspecified: Secondary | ICD-10-CM | POA: Diagnosis not present

## 2020-09-02 DIAGNOSIS — N390 Urinary tract infection, site not specified: Secondary | ICD-10-CM | POA: Diagnosis not present

## 2020-09-02 DIAGNOSIS — I5032 Chronic diastolic (congestive) heart failure: Secondary | ICD-10-CM | POA: Diagnosis not present

## 2020-09-12 DIAGNOSIS — J449 Chronic obstructive pulmonary disease, unspecified: Secondary | ICD-10-CM | POA: Diagnosis not present

## 2020-09-12 DIAGNOSIS — I10 Essential (primary) hypertension: Secondary | ICD-10-CM | POA: Diagnosis not present

## 2020-09-12 DIAGNOSIS — D6869 Other thrombophilia: Secondary | ICD-10-CM | POA: Diagnosis not present

## 2020-09-12 DIAGNOSIS — Z299 Encounter for prophylactic measures, unspecified: Secondary | ICD-10-CM | POA: Diagnosis not present

## 2020-09-12 DIAGNOSIS — I4891 Unspecified atrial fibrillation: Secondary | ICD-10-CM | POA: Diagnosis not present

## 2020-09-12 DIAGNOSIS — I5032 Chronic diastolic (congestive) heart failure: Secondary | ICD-10-CM | POA: Diagnosis not present

## 2020-10-13 ENCOUNTER — Other Ambulatory Visit: Payer: Self-pay | Admitting: Cardiology

## 2020-10-18 ENCOUNTER — Other Ambulatory Visit (HOSPITAL_COMMUNITY): Payer: Self-pay | Admitting: Nurse Practitioner

## 2020-10-18 DIAGNOSIS — Z1231 Encounter for screening mammogram for malignant neoplasm of breast: Secondary | ICD-10-CM

## 2020-11-21 ENCOUNTER — Ambulatory Visit (HOSPITAL_COMMUNITY)
Admission: RE | Admit: 2020-11-21 | Discharge: 2020-11-21 | Disposition: A | Discharge status: Home / Self Care | Payer: Medicare Other | Source: Ambulatory Visit | Attending: Nurse Practitioner | Admitting: Nurse Practitioner

## 2020-11-21 DIAGNOSIS — Z1231 Encounter for screening mammogram for malignant neoplasm of breast: Secondary | ICD-10-CM | POA: Diagnosis not present

## 2020-11-27 ENCOUNTER — Encounter: Payer: Self-pay | Admitting: Internal Medicine

## 2020-11-27 ENCOUNTER — Ambulatory Visit: Payer: Medicare Other | Admitting: Internal Medicine

## 2020-11-27 ENCOUNTER — Ambulatory Visit (HOSPITAL_COMMUNITY)
Admission: RE | Admit: 2020-11-27 | Discharge: 2020-11-27 | Disposition: A | Discharge status: Home / Self Care | Payer: Medicare Other | Source: Ambulatory Visit | Attending: Internal Medicine | Admitting: Internal Medicine

## 2020-11-27 ENCOUNTER — Other Ambulatory Visit: Payer: Self-pay

## 2020-11-27 DIAGNOSIS — J479 Bronchiectasis, uncomplicated: Secondary | ICD-10-CM | POA: Insufficient documentation

## 2020-11-27 DIAGNOSIS — J984 Other disorders of lung: Secondary | ICD-10-CM | POA: Diagnosis not present

## 2020-11-27 DIAGNOSIS — R059 Cough, unspecified: Secondary | ICD-10-CM | POA: Diagnosis not present

## 2020-11-27 MED ORDER — LEVOFLOXACIN 500 MG PO TABS: 500.0000 mg | ORAL_TABLET | Freq: Every day | ORAL | 5 refills | Status: DC

## 2020-11-27 NOTE — Patient Instructions (Signed)
For nasty mucus > levaquin 500 mg daily x 10 days (keep on hand)  Please remember to go to the x-ray department at [facility]   for your tests - we will call you with the results when they are available.      Please schedule a follow up visit in 12 months but call sooner if needed

## 2020-11-27 NOTE — Progress Notes (Signed)
Subjective:     Patient ID: Shelly Fields, female   DOB: 05/12/1935,    MRN: [id number]    Brief patient profile:  76 yowf never smoked started with  Chronic cough  Developed 12/2015 mostly with bending over with mostly yellowish with steaks of blood while xarelto but for the most part no blood and w/u bronchiolectasis with  Dx ? MAI in [place] so referred to pulmonary clinic 03/22/2017 by Shelly Fields     History of Present Illness  03/22/2017 1st [facility] office visit/ Shelly Fields   Chief Complaint  Patient presents with   Pulmonary Consult    Referred by Shelly Fields for eval of [facility] Pt c/o occ dry cough.   most days x 15 months only "spits up" mucus (clears throat "I don't cough" )  in afternoons < tbsp yellowish thick mucus  Not limited by breathing from desired activities   Assoc with voice fatigue rec Pantoprazole (protonix) 40 mg   Take  30-60 min before first meal of the day and Pepcid (famotidine)  20 mg one @  bedtime until return to office - this is the best way to tell whether stomach acid is contributing to your problem.   GERD (REFLUX)  Diet    05/13/2017  f/u ov/Shelly Fields re:   MAI/ cough has resolved on GERD rx  Not limited by breathing from desired activities   No am excess mucus rec No change in your medications until the pantaprazole (protonix)  expires (all the refills)  Then stop it Continue the diet and the pepcid (famotidine) at bedtime and when you run out of the pantaprazole refills then take the pepcid after bfast also     02/13/2020  f/u ov/[place] office/Shelly Fields re: bronchiectasis / possible MAI  Chief Complaint  Patient presents with   Follow-up    Breathing has overall been doing well. She has some brown sputum- minimal some mornings.    Dyspnea:  MMRC1 = can walk nl pace, flat grade, can't hurry or go uphills or steps s sob   Cough: better overall / min darker just in am sev x a week Sleeping: bed blocks ok  SABA use: no inhalers 02: none Rec If the  mucus is getting nastier or more plentiful > levaquin 500 mg daily x 10 days - ok to call for refills   11/27/2020  f/u ov/[place] office/Shelly Fields re: bronchiectasis  Chief Complaint  Patient presents with   Follow-up    Breathing is unchanged since the last visit. She is not coughing much and feels she is overall doing well.     Dyspnea:  MMRC1 = can walk nl pace, flat grade, can't hurry or go uphills or steps s sob   Cough: minimal  Sleeping: bed blocks  SABA use: none  02: none  Covid status: x 3 vax  Lung cancer screening: n/a    No obvious day to day or daytime variability or assoc excess/ purulent sputum or mucus plugs or hemoptysis or cp or chest tightness, subjective wheeze or overt sinus or hb symptoms.   Sleeping ok  without nocturnal  or early am exacerbation  of respiratory  c/o's or need for noct saba. Also denies any obvious fluctuation of symptoms with weather or environmental changes or other aggravating or alleviating factors except as outlined above   No unusual exposure hx or h/o childhood pna/ asthma or knowledge of premature birth.  Current Allergies, Complete Past Medical History, Past Surgical History, Family History, and  Social History were reviewed in [employer] record.  ROS  The following are not active complaints unless bolded Hoarseness, sore throat, dysphagia, dental problems, itching, sneezing,  nasal congestion or discharge of excess mucus or purulent secretions, ear ache,   fever, chills, sweats, unintended wt loss or wt gain, classically pleuritic or exertional cp,  orthopnea pnd or arm/hand swelling  or leg swelling, presyncope, palpitations, abdominal pain, anorexia, nausea, vomiting, diarrhea  or change in bowel habits or change in bladder habits, change in stools or change in urine, dysuria, hematuria,  rash, arthralgias, visual complaints, headache, numbness, weakness or ataxia or problems with walking or coordination,  change in  mood or  memory.        Current Meds  Medication Sig   calcium-vitamin D 250-100 MG-UNIT tablet Take 1 tablet by mouth every other day.   Carboxymethylcellulose Sodium (THERATEARS OP) Place 1 drop into both eyes daily.    denosumab (PROLIA) 60 MG/ML SOSY injection Inject 60 mg into the skin every 6 (six) months.   docusate sodium (COLACE) 100 MG capsule Take 100 mg by mouth at bedtime.    furosemide (LASIX) 20 MG tablet Take 20 mg by mouth.   levothyroxine (SYNTHROID, LEVOTHROID) 50 MCG tablet Take 50 mcg by mouth daily before breakfast.    metoprolol succinate (TOPROL-XL) 50 MG 24 hr tablet TAKE 1 AND 1/2 TABLETS BY  MOUTH TWICE DAILY   potassium chloride (KLOR-CON) 10 MEQ tablet TAKE 1 TABLET BY MOUTH  DAILY   Rivaroxaban (XARELTO) 15 MG TABS tablet TAKE 1 TABLET BY MOUTH  DAILY WITH SUPPER   senna (SENOKOT) 8.6 MG tablet Take 1 tablet by mouth daily as needed for constipation. Take 1 tablet by mouth every other night   [DISCONTINUED] furosemide (LASIX) 20 MG tablet TAKE 1 TABLET DAILY, AND  INCREASE TO 2 TABLETS DAILY FOR A FEW DAYS FOR WEIGHT  GAIN 2 TO 3 LBS IN 24 HOURS OR 5 LBS IN A WEEK.                                Objective:   Physical Exam     11/27/2020       99  02/13/2020          100 11/28/2019       101 11/28/2018        97   02/11/2018          100  08/11/2017         102   05/13/2017      102   03/22/17 101 lb (45.8 kg)  03/18/17 101 lb (45.8 kg)  09/15/16 97 lb 12.8 oz (44.4 kg)     Vital signs reviewed  11/27/2020  - Note at rest 02 sats  99% on RA   General appearance:   slt hoarse amb wf nad       HEENT : pt wearing mask not removed for exam due to covid -19 concerns.    NECK :  without JVD/Nodes/TM/ nl carotid upstrokes bilaterally   LUNGS: no acc muscle use,   mildly kyphotic contour chest with minimal squeaks/pops bases bilaterally without cough on insp or exp maneuvers   CV:  IRIR no s3 or murmur or increase in P2, and no edema   ABD:  soft  and nontender with nl inspiratory excursion in the supine position. No bruits or organomegaly appreciated, bowel sounds nl  MS:  Nl gait/ ext warm without deformities, calf tenderness, cyanosis or clubbing No obvious joint restrictions   SKIN: warm and dry without lesions    NEURO:  alert, approp, nl sensorium with  no motor or cerebellar deficits apparent.           CXR PA and Lateral:   11/27/2020 :    I personally reviewed images and  impression as follows:    Cm with chronic scarring/ kyphosis         Assessment:

## 2020-11-28 ENCOUNTER — Encounter: Payer: Self-pay | Admitting: Internal Medicine

## 2020-11-28 ENCOUNTER — Encounter: Payer: Self-pay | Admitting: Cardiology

## 2020-11-28 DIAGNOSIS — Z299 Encounter for prophylactic measures, unspecified: Secondary | ICD-10-CM | POA: Diagnosis not present

## 2020-11-28 DIAGNOSIS — Z0000 Encounter for general adult medical examination without abnormal findings: Secondary | ICD-10-CM | POA: Diagnosis not present

## 2020-11-28 DIAGNOSIS — Z789 Other specified health status: Secondary | ICD-10-CM | POA: Diagnosis not present

## 2020-11-28 DIAGNOSIS — Z681 Body mass index (BMI) 19 or less, adult: Secondary | ICD-10-CM | POA: Diagnosis not present

## 2020-11-28 DIAGNOSIS — Z7189 Other specified counseling: Secondary | ICD-10-CM | POA: Diagnosis not present

## 2020-11-28 DIAGNOSIS — I5032 Chronic diastolic (congestive) heart failure: Secondary | ICD-10-CM | POA: Diagnosis not present

## 2020-11-28 DIAGNOSIS — E039 Hypothyroidism, unspecified: Secondary | ICD-10-CM | POA: Diagnosis not present

## 2020-11-28 DIAGNOSIS — E7800 Pure hypercholesterolemia, unspecified: Secondary | ICD-10-CM | POA: Diagnosis not present

## 2020-11-28 DIAGNOSIS — I10 Essential (primary) hypertension: Secondary | ICD-10-CM | POA: Diagnosis not present

## 2020-11-28 DIAGNOSIS — R5383 Other fatigue: Secondary | ICD-10-CM | POA: Diagnosis not present

## 2020-11-28 DIAGNOSIS — Z79899 Other long term (current) drug therapy: Secondary | ICD-10-CM | POA: Diagnosis not present

## 2020-11-28 NOTE — Assessment & Plan Note (Signed)
Onset of cough 2017/never smoker  Chest CT  01/06/17  C/w MAI with "bronciolectasis" L > R bases / no real change since  02/11/16  but new findings c/w PH - PFT's  05/13/2017  FEV1 1.54 (85 % ) ratio 64   p nothing prior to study with DLCO  59/63c % corrects to 103 % for alv volume   - CT 08/23/18 no change  - CT chest 12/28/2019 1. Similar appearance of cylindrical bronchiectasis in the right middle lobe with associated airway impaction and clustered micro nodularity. 2. Similar appearance of cylindrical bronchiectasis in the left lower lobe although there is more pronounced airway impaction in the posterior left lower lobe today than on the previous exam. cxr 11/27/2020 no change bilateral scarring      No clinical or radiographic progression, no changes needed  = levaquin 500 x 10 days for flare/ f/u yearly         Each maintenance medication was reviewed in detail including emphasizing most importantly the difference between maintenance and prns and under what circumstances the prns are to be triggered using an action plan format where appropriate.  Total time for H and P, chart review, counseling, reviewing action plan for levaquin,  and generating customized AVS unique to this office visit / same day charting = 25 min

## 2020-11-29 ENCOUNTER — Telehealth: Payer: Self-pay | Admitting: Internal Medicine

## 2020-11-29 NOTE — Telephone Encounter (Signed)
Shelly Rockers, MD  11/29/2020  8:53 AM EDT      Call pt:  Reviewed cxr and no acute change so no change in recommendations madeat ov   Spoke with pt and notified of results per Dr. Melvyn Novas. Pt verbalized understanding and denied any questions.

## 2020-11-29 NOTE — Progress Notes (Signed)
Called the pt, she was unable to hear me- issues with her bluetooth and she said she would call back. Will await her call.

## 2020-12-03 NOTE — Progress Notes (Signed)
Spoke with pt and notified of results per Dr. Wert. Pt verbalized understanding and denied any questions. 

## 2020-12-19 ENCOUNTER — Ambulatory Visit: Payer: Medicare Other | Admitting: Cardiology

## 2020-12-20 ENCOUNTER — Ambulatory Visit: Payer: Medicare Other | Admitting: Cardiology

## 2020-12-20 ENCOUNTER — Encounter: Payer: Self-pay | Admitting: Cardiology

## 2020-12-20 VITALS — BP 148/82 | HR 83 | Ht 62.0 in | Wt 101.0 lb

## 2020-12-20 DIAGNOSIS — I4819 Other persistent atrial fibrillation: Secondary | ICD-10-CM

## 2020-12-20 DIAGNOSIS — I5032 Chronic diastolic (congestive) heart failure: Secondary | ICD-10-CM

## 2020-12-20 MED ORDER — RIVAROXABAN 15 MG PO TABS: ORAL_TABLET | ORAL | 0 refills | Status: DC

## 2020-12-20 NOTE — Progress Notes (Signed)
Cardiology Office Note  Date: 12/20/2020   ID: Shelly Fields, DOB 02-11-1935, MRN [id number]  PCP:  Berenice Primas  Cardiologist:  Rozann Lesches, MD Electrophysiologist:  None   Chief Complaint  Patient presents with   Cardiac follow-up    History of Present Illness: Shelly Fields is an 86 y.o. female last seen in December 2021.  She is here for a routine visit.  She does not report any sense of palpitations or chest pain.  I reviewed her medications which are noted below.  She continues on Xarelto 15 mg daily, has had some minor bleeding from spider veins in her legs at times, but no blood major bleeding episodes.  I reviewed her recent lab work from June.  Hemoglobin was normal.  I personally reviewed her ECG today which shows rate controlled atrial fibrillation with rightward axis and R' in lead V1 and V2.  Past Medical History:  Diagnosis Date   Cor pulmonale (chronic) (HCC)    Diverticulosis    Essential hypertension    Hypothyroidism    Internal hemorrhoids    Persistent atrial fibrillation ([place])    Pulmonary hypertension (HCC)    Seasonal allergies    Shingles     Past Surgical History:  Procedure Laterality Date   ABDOMINAL HYSTERECTOMY     CATARACT EXTRACTION     COLONOSCOPY N/A 01/12/2013   Procedure: COLONOSCOPY;  Surgeon: Rogene Houston, MD;  [facility];  Service: Endoscopy;  Laterality: N/A;  1030   COLONOSCOPY N/A 11/18/2017   Procedure: COLONOSCOPY;  Surgeon: Rogene Houston, MD;  [facility];  Service: Endoscopy;  Laterality: N/A;   POLYPECTOMY  11/18/2017   Procedure: POLYPECTOMY;  Surgeon: Rogene Houston, MD;  [facility];  Service: Endoscopy;;  colon    Current Outpatient Medications  Medication Sig Dispense Refill   Carboxymethylcellulose Sodium (THERATEARS OP) Place 1 drop into both eyes daily.      denosumab (PROLIA) 60 MG/ML SOSY injection Inject 60 mg into the skin every 6 (six) months.      docusate sodium (COLACE) 100 MG capsule Take 100 mg by mouth at bedtime.      furosemide (LASIX) 20 MG tablet Take 20 mg by mouth.     levofloxacin (LEVAQUIN) 500 MG tablet Take 1 tablet (500 mg total) by mouth daily. 10 tablet 5   levothyroxine (SYNTHROID, LEVOTHROID) 50 MCG tablet Take 1 1/2 tablet by mouth (75 mg total) daily before breaskfast     metoprolol succinate (TOPROL-XL) 50 MG 24 hr tablet TAKE 1 AND 1/2 TABLETS BY  MOUTH TWICE DAILY 270 tablet 3   potassium chloride (KLOR-CON) 10 MEQ tablet TAKE 1 TABLET BY MOUTH  DAILY 90 tablet 1   senna (SENOKOT) 8.6 MG tablet Take 1 tablet by mouth daily as needed for constipation. Take 1 tablet by mouth every other night     Rivaroxaban (XARELTO) 15 MG TABS tablet TAKE 1 TABLET BY MOUTH  DAILY WITH SUPPER 21 tablet 0   No current facility-administered medications for this visit.   Allergies:  Patient has no known allergies.   ROS: No orthopnea or PND.  No syncope.  Physical Exam: VS:  BP (!) 148/82   Pulse 83   Ht 5\' 2"  (1.575 m)   Wt 101 lb (45.8 kg)   SpO2 99%   BMI 18.47 kg/m , BMI Body mass index is 18.47 kg/m.  Wt Readings from Last 3 Encounters:  12/20/20 101 lb (45.8 kg)  11/27/20 101 lb (45.8 kg)  05/27/20 101 lb (45.8 kg)    General: Elderly woman, appears comfortable at rest. HEENT: Conjunctiva and lids normal, wearing a mask. Neck: Supple, no elevated JVP or carotid bruits, no thyromegaly. Lungs: Clear to auscultation, nonlabored breathing at rest. Cardiac: irregularly irregular, no S3, 2/6 systolic murmur. Extremities: No pitting edema.  Venous stasis and spider veins.  ECG:  An ECG dated 11/17/2019 was personally reviewed today and demonstrated:  Atrial fibrillation with controlled rate.  Recent Labwork: 05/17/2020: BUN 16; Creat 0.81; Hemoglobin 13.0; Platelets 273; Potassium 4.7; Sodium 130  June 2022: Hemoglobin 13.6, platelets 192, BUN 10, creatinine 0.74, potassium 5.0, AST 25, ALT 19, cholesterol 125,  triglycerides 61, HDL 48, LDL 64, TSH 6.63  Other Studies Reviewed Today:  Echocardiogram 05/22/2020:  1. Left ventricular ejection fraction, by estimation, is 60 to 65%. The  left ventricle has normal function. The left ventricle has no regional  wall motion abnormalities. Left ventricular diastolic parameters are  indeterminate.   2. Right ventricular systolic function is low normal. The right  ventricular size is severely enlarged.   3. Left atrial size was severely dilated.   4. Right atrial size was severely dilated.   5. The pericardial effusion is circumferential.   6. The mitral valve is normal in structure. No evidence of mitral valve  regurgitation. No evidence of mitral stenosis.   7. The tricuspid valve is abnormal. Tricuspid valve regurgitation is  severe.   8. The aortic valve is tricuspid. Aortic valve regurgitation is mild. No  aortic stenosis is present.   9. Borderline pulmonary HTN, PASP is 30 mmHg.  10. The inferior vena cava is dilated in size with <50% respiratory  variability, suggesting right atrial pressure of 15 mmHg.  Assessment and Plan:  1.  Permanent atrial fibrillation with CHA2DS2-VASc score of 4.  She is doing well with strategy of heart rate control and anticoagulation.  Continue Xarelto 15 mg daily and present dose of Toprol-XL.  2.  History of chronic diastolic heart failure and RV dysfunction.  She remains on low-dose Lasix with potassium supplement.  Weight is stable.  Medication Adjustments/Labs and Tests Ordered: Current medicines are reviewed at length with the patient today.  Concerns regarding medicines are outlined above.   Tests Ordered: Orders Placed This Encounter  Procedures   EKG 12-Lead   EKG 12-Lead    Medication Changes: Meds ordered this encounter  Medications   Rivaroxaban (XARELTO) 15 MG TABS tablet    Sig: TAKE 1 TABLET BY MOUTH  DAILY WITH SUPPER    Dispense:  21 tablet    Refill:  0    Lot # [id number] Exp 09/2022      Disposition:  Follow up  6 months.  Signed, Satira Sark, MD, [facility] 12/20/2020 3:03 PM    [facility] [place], [place], [place] [address] Phone: [phone]; Fax: [phone]

## 2020-12-20 NOTE — Patient Instructions (Addendum)

## 2020-12-23 ENCOUNTER — Other Ambulatory Visit: Payer: Self-pay | Admitting: Cardiology

## 2020-12-23 DIAGNOSIS — I4819 Other persistent atrial fibrillation: Secondary | ICD-10-CM

## 2020-12-23 NOTE — Telephone Encounter (Signed)
Prescription refill request for Xarelto received.  Indication: Atrial fib Last office visit: 12/20/20  Myles Gip MD Weight: 45.8kg Age: 86 Scr: 0.74 on 11/28/20 CrCl: 41.65  Based on above findings Xarelto 15mg  once daily is the appropriate dose.  Refill approved.

## 2021-01-07 ENCOUNTER — Other Ambulatory Visit: Payer: Self-pay | Admitting: Cardiology

## 2021-02-03 DIAGNOSIS — X32XXXD Exposure to sunlight, subsequent encounter: Secondary | ICD-10-CM | POA: Diagnosis not present

## 2021-02-03 DIAGNOSIS — I781 Nevus, non-neoplastic: Secondary | ICD-10-CM | POA: Diagnosis not present

## 2021-02-03 DIAGNOSIS — Z08 Encounter for follow-up examination after completed treatment for malignant neoplasm: Secondary | ICD-10-CM | POA: Diagnosis not present

## 2021-02-03 DIAGNOSIS — Z85828 Personal history of other malignant neoplasm of skin: Secondary | ICD-10-CM | POA: Diagnosis not present

## 2021-02-03 DIAGNOSIS — L570 Actinic keratosis: Secondary | ICD-10-CM | POA: Diagnosis not present

## 2021-02-03 DIAGNOSIS — I8310 Varicose veins of unspecified lower extremity with inflammation: Secondary | ICD-10-CM | POA: Diagnosis not present

## 2021-02-14 DIAGNOSIS — E871 Hypo-osmolality and hyponatremia: Secondary | ICD-10-CM | POA: Diagnosis not present

## 2021-02-20 DIAGNOSIS — M810 Age-related osteoporosis without current pathological fracture: Secondary | ICD-10-CM | POA: Diagnosis not present

## 2021-03-07 DIAGNOSIS — I10 Essential (primary) hypertension: Secondary | ICD-10-CM | POA: Diagnosis not present

## 2021-03-07 DIAGNOSIS — E7800 Pure hypercholesterolemia, unspecified: Secondary | ICD-10-CM | POA: Diagnosis not present

## 2021-04-07 DIAGNOSIS — I10 Essential (primary) hypertension: Secondary | ICD-10-CM | POA: Diagnosis not present

## 2021-04-07 DIAGNOSIS — E7800 Pure hypercholesterolemia, unspecified: Secondary | ICD-10-CM | POA: Diagnosis not present

## 2021-04-09 ENCOUNTER — Telehealth: Payer: Self-pay | Admitting: Cardiology

## 2021-04-09 DIAGNOSIS — I4819 Other persistent atrial fibrillation: Secondary | ICD-10-CM

## 2021-04-09 MED ORDER — RIVAROXABAN 15 MG PO TABS: 15.0000 mg | ORAL_TABLET | Freq: Every day | ORAL | 0 refills | Status: DC

## 2021-04-09 NOTE — Telephone Encounter (Signed)
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   She's going to be out around town getting her flu shot and has her cell phone   [phone]

## 2021-04-09 NOTE — Telephone Encounter (Signed)
Patient requesting Xarelto samples:  Indication: Atrial fib Last office visit: 12/20/20  Myles Gip MD Weight: 45.8kg Age: 86 Scr: 0.74 on 11/28/20 CrCl: 41.65   Based on above findings Xarelto 15mg  once daily is the appropriate dose.  OK to give samples if available.

## 2021-05-11 ENCOUNTER — Other Ambulatory Visit: Payer: Self-pay | Admitting: Cardiology

## 2021-05-11 DIAGNOSIS — I4819 Other persistent atrial fibrillation: Secondary | ICD-10-CM

## 2021-05-12 NOTE — Telephone Encounter (Signed)
Prescription refill request for Xarelto received.  Indication: PAF Last office visit: 12/20/20  Myles Gip MD Weight: 45.8kg Age: 86 Scr: 0.74 on 11/28/20 CrCl: 41.65  Based on above findings Xarelto 15mg  daily Ren[place]) is the appropriate dose.  Refill approved x 2.  Pt has appt with Dr Domenic Polite on 06/26/21.  Requested CBC/BMP at that time.

## 2021-05-15 DIAGNOSIS — Z23 Encounter for immunization: Secondary | ICD-10-CM | POA: Diagnosis not present

## 2021-05-17 ENCOUNTER — Other Ambulatory Visit: Payer: Self-pay | Admitting: Cardiology

## 2021-06-26 ENCOUNTER — Ambulatory Visit (INDEPENDENT_AMBULATORY_CARE_PROVIDER_SITE_OTHER): Payer: Medicare Other | Admitting: Cardiology

## 2021-06-26 ENCOUNTER — Encounter: Payer: Self-pay | Admitting: Cardiology

## 2021-06-26 ENCOUNTER — Other Ambulatory Visit: Payer: Self-pay

## 2021-06-26 VITALS — BP 132/82 | HR 64 | Ht 62.0 in | Wt 98.6 lb

## 2021-06-26 DIAGNOSIS — I4819 Other persistent atrial fibrillation: Secondary | ICD-10-CM

## 2021-06-26 DIAGNOSIS — I5032 Chronic diastolic (congestive) heart failure: Secondary | ICD-10-CM

## 2021-06-26 DIAGNOSIS — Z79899 Other long term (current) drug therapy: Secondary | ICD-10-CM

## 2021-06-26 MED ORDER — RIVAROXABAN 15 MG PO TABS: 15.0000 mg | ORAL_TABLET | Freq: Every day | ORAL | 0 refills | Status: DC

## 2021-06-26 NOTE — Patient Instructions (Addendum)
Medication Instructions:  Your physician recommends that you continue on your current medications as directed. Please refer to the Current Medication list given to you today.  Labwork: BMET & CBC as soon as possible Non-fasting [employer]  or [employer] or [employer] in [place] M-F 7am-4pm No appointment needed  Testing/Procedures: none  Follow-Up: Your physician recommends that you schedule a follow-up appointment in: 6 months  Any Other Special Instructions Will Be Listed Below (If Applicable).  If you need a refill on your cardiac medications before your next appointment, please call your pharmacy.

## 2021-06-26 NOTE — Progress Notes (Signed)
Cardiology Office Note  Date: 06/26/2021   ID: LYLIANA DICENSO, DOB [birth year], MRN [id number]  PCP:  Berenice Primas  Cardiologist:  Rozann Lesches, MD Electrophysiologist:  None   Chief Complaint  Patient presents with   Cardiac follow-up    History of Present Illness: Shelly Fields is an 87 y.o. female last seen in July 2022.  She is here for a routine visit.  Reports no change in symptoms other than a single episode of chest tightness that occurred one Sunday after walking into Sunday school class.  This has not been a recurring problem.  She does not report any regular palpitations and states that she has been taking her medications regularly.  She is due for follow-up lab work on [facility].  We discussed getting this arranged.  She reports home blood pressure ranging 120s to 130s over 70s to 80s.  Medications are noted below.  Past Medical History:  Diagnosis Date   Cor pulmonale (chronic) (HCC)    Diverticulosis    Essential hypertension    Hypothyroidism    Internal hemorrhoids    Persistent atrial fibrillation ([place])    Pulmonary hypertension (HCC)    Seasonal allergies    Shingles     Past Surgical History:  Procedure Laterality Date   ABDOMINAL HYSTERECTOMY     CATARACT EXTRACTION     COLONOSCOPY N/A 01/12/2013   Procedure: COLONOSCOPY;  Surgeon: Rogene Houston, MD;  [facility];  Service: Endoscopy;  Laterality: N/A;  1030   COLONOSCOPY N/A 11/18/2017   Procedure: COLONOSCOPY;  Surgeon: Rogene Houston, MD;  [facility];  Service: Endoscopy;  Laterality: N/A;   POLYPECTOMY  11/18/2017   Procedure: POLYPECTOMY;  Surgeon: Rogene Houston, MD;  [facility];  Service: Endoscopy;;  colon    Current Outpatient Medications  Medication Sig Dispense Refill   Calcium Carb-Cholecalciferol (CALCIUM + D3 PO) Take 1 tablet by mouth daily.     Carboxymethylcellulose Sodium (THERATEARS OP) Place 1 drop into both eyes daily.       denosumab (PROLIA) 60 MG/ML SOSY injection Inject 60 mg into the skin every 6 (six) months.     docusate sodium (COLACE) 100 MG capsule Take 100 mg by mouth at bedtime.      furosemide (LASIX) 20 MG tablet TAKE 1 TABLET BY MOUTH  DAILY, AND INCREASE TO 2  TABS DAILY FOR A FEW DAYS  FOR WEIGHT GAIN 2-3 LBS IN  24 HRS OR 5 LBS IN A WEEK. 180 tablet 3   levothyroxine (SYNTHROID, LEVOTHROID) 50 MCG tablet Take 1 1/2 tablet by mouth (75 mg total) daily before breaskfast     metoprolol succinate (TOPROL-XL) 50 MG 24 hr tablet TAKE 1 AND 1/2 TABLETS BY  MOUTH TWICE DAILY 270 tablet 3   potassium chloride (KLOR-CON) 10 MEQ tablet TAKE 1 TABLET BY MOUTH  DAILY 90 tablet 3   senna (SENOKOT) 8.6 MG tablet Take 1 tablet by mouth daily as needed for constipation. Take 1 tablet by mouth every other night     Rivaroxaban (XARELTO) 15 MG TABS tablet Take 1 tablet (15 mg total) by mouth daily with supper. 28 tablet 0   No current facility-administered medications for this visit.   Allergies:  Patient has no known allergies.   ROS: Hearing loss.  Physical Exam: VS:  BP 132/82    Pulse 64    Ht 5\' 2"  (1.575 m)    Wt 98 lb  9.6 oz (44.7 kg)    BMI 18.03 kg/m , BMI Body mass index is 18.03 kg/m.  Wt Readings from Last 3 Encounters:  06/26/21 98 lb 9.6 oz (44.7 kg)  12/20/20 101 lb (45.8 kg)  11/27/20 101 lb (45.8 kg)    General: Patient appears comfortable at rest. HEENT: Conjunctiva and lids normal, wearing a mask. Neck: Supple, no elevated JVP or carotid bruits, no thyromegaly. Lungs: Clear to auscultation, nonlabored breathing at rest. Cardiac: Irregularly irregular with more prominent S2, 2/6 stock murmur. Extremities: No pitting edema.  Stable venous stasis and spider veins.  ECG:  An ECG dated 12/20/2020 was personally reviewed today and demonstrated:  Atrial fibrillation with rightward axis and R' in lead V1 and V2.  Recent Labwork:  June 2022: Hemoglobin 13.6, platelets 192, BUN 10, creatinine  0.74, potassium 5.0, AST 25, ALT 19, cholesterol 125, triglycerides 61, HDL 48, LDL 64, TSH 6.63  Other Studies Reviewed Today:  Echocardiogram 05/22/2020:  1. Left ventricular ejection fraction, by estimation, is 60 to 65%. The  left ventricle has normal function. The left ventricle has no regional  wall motion abnormalities. Left ventricular diastolic parameters are  indeterminate.   2. Right ventricular systolic function is low normal. The right  ventricular size is severely enlarged.   3. Left atrial size was severely dilated.   4. Right atrial size was severely dilated.   5. The pericardial effusion is circumferential.   6. The mitral valve is normal in structure. No evidence of mitral valve  regurgitation. No evidence of mitral stenosis.   7. The tricuspid valve is abnormal. Tricuspid valve regurgitation is  severe.   8. The aortic valve is tricuspid. Aortic valve regurgitation is mild. No  aortic stenosis is present.   9. Borderline pulmonary HTN, PASP is 30 mmHg.  10. The inferior vena cava is dilated in size with <50% respiratory  variability, suggesting right atrial pressure of 15 mmHg.  Assessment and Plan:  1.  Permanent atrial fibrillation with CHA2DS2-VASc score of 4.  Plan to continue Toprol-XL at present dose, recheck CBC and BMET on Xarelto presently dosed at 15 mg daily.  She does not report any spontaneous bleeding problems.  2.  HFpEF, RV contraction low normal with upper normal estimated PASP by echocardiogram in December 2021.  She remains on Lasix with potassium supplement and shows no evidence of fluid overload.  Medication Adjustments/Labs and Tests Ordered: Current medicines are reviewed at length with the patient today.  Concerns regarding medicines are outlined above.   Tests Ordered: Orders Placed This Encounter  Procedures   CBC   Basic metabolic panel    Medication Changes: Meds ordered this encounter  Medications   Rivaroxaban (XARELTO) 15 MG  TABS tablet    Sig: Take 1 tablet (15 mg total) by mouth daily with supper.    Dispense:  28 tablet    Refill:  0    Lot # [id number] Exp 09/2022    Disposition:  Follow up  6 months.  Signed, Satira Sark, MD, [facility] 06/26/2021 1:18 PM    [facility] at [place], [place], [place] [address] Phone: [phone]; Fax: [phone]

## 2021-06-27 ENCOUNTER — Telehealth: Payer: Self-pay | Admitting: *Deleted

## 2021-06-27 NOTE — Telephone Encounter (Signed)
-----   Message from Satira Sark, MD sent at 06/26/2021  4:56 PM EST ----- Results reviewed.  Potassium is mildly elevated at 5.3, renal function otherwise stable.  Continue Xarelto at 15 mg daily.  Would recommend that she stop potassium supplement at this point.

## 2021-06-27 NOTE — Telephone Encounter (Signed)
Patient informed. Copy sent to PCP °

## 2021-07-08 DIAGNOSIS — I10 Essential (primary) hypertension: Secondary | ICD-10-CM | POA: Diagnosis not present

## 2021-07-08 DIAGNOSIS — E7800 Pure hypercholesterolemia, unspecified: Secondary | ICD-10-CM | POA: Diagnosis not present

## 2021-08-12 ENCOUNTER — Other Ambulatory Visit: Payer: Self-pay | Admitting: Cardiology

## 2021-08-12 DIAGNOSIS — I4819 Other persistent atrial fibrillation: Secondary | ICD-10-CM

## 2021-08-12 NOTE — Telephone Encounter (Signed)
Prescription refill request for Xarelto received.  ?Indication: Atrial fib ?Last office visit: 06/26/21  Myles Gip MD ?Weight: 44.7kg ?Age: 87 ?Scr: 0.84 on 06/26/21 ?CrCl: 35.18 ? ?Based on above findings Xarelto '15mg'$  daily is the appropriate dose.  Refill approved. ? ?

## 2021-08-19 DIAGNOSIS — M810 Age-related osteoporosis without current pathological fracture: Secondary | ICD-10-CM | POA: Diagnosis not present

## 2021-09-03 DIAGNOSIS — H903 Sensorineural hearing loss, bilateral: Secondary | ICD-10-CM | POA: Diagnosis not present

## 2021-10-01 ENCOUNTER — Other Ambulatory Visit: Payer: Self-pay | Admitting: Cardiology

## 2021-10-22 ENCOUNTER — Other Ambulatory Visit (HOSPITAL_COMMUNITY): Payer: Self-pay | Admitting: Nurse Practitioner

## 2021-10-22 DIAGNOSIS — Z1231 Encounter for screening mammogram for malignant neoplasm of breast: Secondary | ICD-10-CM

## 2021-11-27 ENCOUNTER — Ambulatory Visit: Payer: Medicare Other | Admitting: Internal Medicine

## 2021-11-27 ENCOUNTER — Encounter: Payer: Self-pay | Admitting: Internal Medicine

## 2021-11-27 DIAGNOSIS — J479 Bronchiectasis, uncomplicated: Secondary | ICD-10-CM | POA: Diagnosis not present

## 2021-11-27 NOTE — Patient Instructions (Signed)
No change in medications   If any active obvious blood the xarelto immediately and then call for advice about further dosing   Please schedule a follow up visit in 12 months but call sooner if needed

## 2021-11-27 NOTE — Assessment & Plan Note (Signed)
Onset of cough 2017/never smoker  Chest CT  01/06/17  C/w MAI with "bronciolectasis" L > R bases / no real change since  02/11/16  but new findings c/w PH - PFT's  05/13/2017  FEV1 1.54 (85 % ) ratio 64   p nothing prior to study with DLCO  59/63c % corrects to 103 % for alv volume   - CT 08/23/18 no change  - CT chest 12/28/2019 1. Similar appearance of cylindrical bronchiectasis in the right middle lobe with associated airway impaction and clustered micro nodularity. 2. Similar appearance of cylindrical bronchiectasis in the left lower lobe although there is more pronounced airway impaction in the posterior left lower lobe today than on the previous exam. cxr 11/27/2020 no change bilateral scarring   No flares since last ov or clinical evidence of progression so no need for cxr's  Advised re stop xarelto for active bleeding/ take levaquin 500 x 10 days for flare   .>>> f/u q 12 m, sooner prn          Each maintenance medication was reviewed in detail including emphasizing most importantly the difference between maintenance and prns and under what circumstances the prns are to be triggered using an action plan format where appropriate.  Total time for H and P, chart review, counseling,   and generating customized AVS unique to this office visit / same day charting =20 min

## 2021-12-03 DIAGNOSIS — Z681 Body mass index (BMI) 19 or less, adult: Secondary | ICD-10-CM | POA: Diagnosis not present

## 2021-12-03 DIAGNOSIS — Z0000 Encounter for general adult medical examination without abnormal findings: Secondary | ICD-10-CM | POA: Diagnosis not present

## 2021-12-03 DIAGNOSIS — M810 Age-related osteoporosis without current pathological fracture: Secondary | ICD-10-CM | POA: Diagnosis not present

## 2021-12-03 DIAGNOSIS — I5032 Chronic diastolic (congestive) heart failure: Secondary | ICD-10-CM | POA: Diagnosis not present

## 2021-12-03 DIAGNOSIS — I10 Essential (primary) hypertension: Secondary | ICD-10-CM | POA: Diagnosis not present

## 2021-12-03 DIAGNOSIS — Z299 Encounter for prophylactic measures, unspecified: Secondary | ICD-10-CM | POA: Diagnosis not present

## 2021-12-03 DIAGNOSIS — I4891 Unspecified atrial fibrillation: Secondary | ICD-10-CM | POA: Diagnosis not present

## 2021-12-03 DIAGNOSIS — J449 Chronic obstructive pulmonary disease, unspecified: Secondary | ICD-10-CM | POA: Diagnosis not present

## 2021-12-03 DIAGNOSIS — Z7189 Other specified counseling: Secondary | ICD-10-CM | POA: Diagnosis not present

## 2021-12-04 ENCOUNTER — Encounter (INDEPENDENT_AMBULATORY_CARE_PROVIDER_SITE_OTHER): Payer: Self-pay | Admitting: *Deleted

## 2021-12-10 DIAGNOSIS — Z79899 Other long term (current) drug therapy: Secondary | ICD-10-CM | POA: Diagnosis not present

## 2021-12-10 DIAGNOSIS — R5383 Other fatigue: Secondary | ICD-10-CM | POA: Diagnosis not present

## 2021-12-10 DIAGNOSIS — E7800 Pure hypercholesterolemia, unspecified: Secondary | ICD-10-CM | POA: Diagnosis not present

## 2021-12-11 DIAGNOSIS — Z299 Encounter for prophylactic measures, unspecified: Secondary | ICD-10-CM | POA: Diagnosis not present

## 2021-12-11 DIAGNOSIS — R609 Edema, unspecified: Secondary | ICD-10-CM | POA: Diagnosis not present

## 2021-12-11 DIAGNOSIS — Z789 Other specified health status: Secondary | ICD-10-CM | POA: Diagnosis not present

## 2021-12-11 DIAGNOSIS — I10 Essential (primary) hypertension: Secondary | ICD-10-CM | POA: Diagnosis not present

## 2021-12-15 ENCOUNTER — Ambulatory Visit (HOSPITAL_COMMUNITY)
Admission: RE | Admit: 2021-12-15 | Discharge: 2021-12-15 | Disposition: A | Discharge status: Home / Self Care | Payer: Medicare Other | Source: Ambulatory Visit | Attending: Nurse Practitioner | Admitting: Nurse Practitioner

## 2021-12-15 DIAGNOSIS — Z1231 Encounter for screening mammogram for malignant neoplasm of breast: Secondary | ICD-10-CM | POA: Insufficient documentation

## 2021-12-18 DIAGNOSIS — Z299 Encounter for prophylactic measures, unspecified: Secondary | ICD-10-CM | POA: Diagnosis not present

## 2021-12-18 DIAGNOSIS — R609 Edema, unspecified: Secondary | ICD-10-CM | POA: Diagnosis not present

## 2021-12-18 DIAGNOSIS — I10 Essential (primary) hypertension: Secondary | ICD-10-CM | POA: Diagnosis not present

## 2021-12-18 DIAGNOSIS — Z789 Other specified health status: Secondary | ICD-10-CM | POA: Diagnosis not present

## 2021-12-22 NOTE — Progress Notes (Unsigned)
Cardiology Office Note  Date: 12/23/2021   ID: Shelly Fields, Shelly Fields [birth year], MRN [id number]  PCP:  Berenice Primas  Cardiologist:  Rozann Lesches, MD Electrophysiologist:  None   Chief Complaint  Patient presents with   Cardiac follow-up    History of Present Illness: Shelly Fields is an 87 y.o. female last seen in January.  She is here for a follow-up visit.  Reports no progressive sense of palpitations with stable dyspnea on exertion, NYHA class II-III.  She has been having some trouble with loose stools and hemorrhoidal bleeding, scheduled to see GI by PCP.  I reviewed her recent lab work as noted below.  Hemoglobin normal and creatinine stable at 0.81.  Potassium also normal.  I personally reviewed her ECG today which shows rate controlled atrial fibrillation with rightward axis.  She had follow-up with Dr. Melvyn Novas in June for bronchiolectasis and MAI, I reviewed the note.  Past Medical History:  Diagnosis Date   Cor pulmonale (chronic) (HCC)    Diverticulosis    Essential hypertension    Hypothyroidism    Internal hemorrhoids    Persistent atrial fibrillation ([place])    Pulmonary hypertension (HCC)    Seasonal allergies    Shingles     Past Surgical History:  Procedure Laterality Date   ABDOMINAL HYSTERECTOMY     CATARACT EXTRACTION     COLONOSCOPY N/A 01/12/2013   Procedure: COLONOSCOPY;  Surgeon: Rogene Houston, MD;  [facility]: [facility];  Service: Endoscopy;  Laterality: N/A;  1030   COLONOSCOPY N/A 11/18/2017   Procedure: COLONOSCOPY;  Surgeon: Rogene Houston, MD;  Location: [facility];  Service: Endoscopy;  Laterality: N/A;   POLYPECTOMY  11/18/2017   Procedure: POLYPECTOMY;  Surgeon: Rogene Houston, MD;  Location: [facility];  Service: Endoscopy;;  colon    Current Outpatient Medications  Medication Sig Dispense Refill   Carboxymethylcellulose Sodium (THERATEARS OP) Place 1 drop into both eyes daily.      denosumab (PROLIA) 60 MG/ML SOSY  injection Inject 60 mg into the skin every 6 (six) months.     furosemide (LASIX) 20 MG tablet TAKE 1 TABLET BY MOUTH  DAILY, AND INCREASE TO 2  TABS DAILY FOR A FEW DAYS  FOR WEIGHT GAIN 2-3 LBS IN  24 HRS OR 5 LBS IN A WEEK. 180 tablet 3   levothyroxine (SYNTHROID) 88 MCG tablet Take 88 mcg by mouth daily.     metoprolol succinate (TOPROL-XL) 50 MG 24 hr tablet TAKE 1 AND 1/2 TABLETS BY  MOUTH TWICE DAILY 270 tablet 3   XARELTO 15 MG TABS tablet TAKE 1 TABLET BY MOUTH DAILY  WITH SUPPER 90 tablet 3   No current facility-administered medications for this visit.   Allergies:  Patient has no known allergies.   ROS: No orthopnea or PND.  Physical Exam: VS:  BP (!) 142/92 (BP Location: Right Arm, Patient Position: Sitting, Cuff Size: Normal)   Pulse 98   Ht '5\' 2"'$  (1.575 m)   Wt 102 lb (46.3 kg)   SpO2 100%   BMI 18.66 kg/m , BMI Body mass index is 18.66 kg/m.  Wt Readings from Last 3 Encounters:  12/23/21 102 lb (46.3 kg)  11/27/21 99 lb 9.6 oz (45.2 kg)  06/26/21 98 lb 9.6 oz (44.7 kg)    General: Patient appears comfortable at rest. HEENT: Conjunctiva and lids normal, oropharynx clear. Neck: Supple, no elevated JVP or carotid bruits, no thyromegaly. Lungs: Coarse  breath sounds without wheezing. Cardiac: Irregularly irregular, prominent S2, 2/6 systolic murmur. Extremities: Mild lower leg edema.  Venous stasis.  ECG:  An ECG dated 12/20/2020 was personally reviewed today and demonstrated:  Atrial fibrillation and rightward axis and R' in lead V1 and V2.  Recent Labwork:  July 2023: Hgb 12.7, platelets 225, BUN 11, creatinine 0.81, potassium 4.2, ALT 13, AST 19, cholesterol 106, triglycerides 61, HDL 39, LDL 53, TSH 5.910  Other Studies Reviewed Today:  Echocardiogram 05/22/2020:  1. Left ventricular ejection fraction, by estimation, is 60 to 65%. The  left ventricle has normal function. The left ventricle has no regional  wall motion abnormalities. Left ventricular diastolic  parameters are  indeterminate.   2. Right ventricular systolic function is low normal. The right  ventricular size is severely enlarged.   3. Left atrial size was severely dilated.   4. Right atrial size was severely dilated.   5. The pericardial effusion is circumferential.   6. The mitral valve is normal in structure. No evidence of mitral valve  regurgitation. No evidence of mitral stenosis.   7. The tricuspid valve is abnormal. Tricuspid valve regurgitation is  severe.   8. The aortic valve is tricuspid. Aortic valve regurgitation is mild. No  aortic stenosis is present.   9. Borderline pulmonary HTN, PASP is 30 mmHg.  10. The inferior vena cava is dilated in size with <50% respiratory  variability, suggesting right atrial pressure of 15 mmHg.  Assessment and Plan:  1.  Permanent atrial fibrillation with CHA2DS2-VASc score of 4.  She remains asymptomatic as far as palpitations and heart rate control is adequate on present dose of Toprol-XL.  Continue Xarelto for stroke prophylaxis.  I reviewed her recent lab work.  ECG stable.  2.  HFpEF with low normal RV contraction and upper normal estimated PASP.  Continue current diuretic plan, she takes Lasix 20 mg daily.  Was given a course of Demadex for 3 days per PCP due to increase in leg edema.  We could go to [facility] instead of Lasix if this is ultimately more effective.  Presently not requiring potassium supplement.  Medication Adjustments/Labs and Tests Ordered: Current medicines are reviewed at length with the patient today.  Concerns regarding medicines are outlined above.   Tests Ordered: Orders Placed This Encounter  Procedures   EKG 12-Lead    Medication Changes: No orders of the defined types were placed in this encounter.   Disposition:  Follow up  6 months.  Signed, Satira Sark, MD, [facility] 12/23/2021 10:10 AM    [facility] [place], [place], [place] [address] Phone: [phone]; Fax: [phone]

## 2021-12-23 ENCOUNTER — Encounter: Payer: Self-pay | Admitting: Cardiology

## 2021-12-23 ENCOUNTER — Ambulatory Visit: Payer: Medicare Other | Admitting: Cardiology

## 2021-12-23 VITALS — BP 142/92 | HR 98 | Ht 62.0 in | Wt 102.0 lb

## 2021-12-23 DIAGNOSIS — I4819 Other persistent atrial fibrillation: Secondary | ICD-10-CM

## 2021-12-23 DIAGNOSIS — I5032 Chronic diastolic (congestive) heart failure: Secondary | ICD-10-CM

## 2021-12-23 MED ORDER — RIVAROXABAN 15 MG PO TABS: 15.0000 mg | ORAL_TABLET | Freq: Every day | ORAL | 0 refills | Status: DC

## 2021-12-23 NOTE — Addendum Note (Signed)
Addended by: Sung Amabile on: 12/23/2021 10:21 AM   Modules accepted: Orders

## 2021-12-23 NOTE — Patient Instructions (Signed)

## 2021-12-23 NOTE — Addendum Note (Signed)
Addended by: Sung Amabile on: 12/23/2021 10:19 AM   Modules accepted: Orders

## 2022-01-02 DIAGNOSIS — Z299 Encounter for prophylactic measures, unspecified: Secondary | ICD-10-CM | POA: Diagnosis not present

## 2022-01-02 DIAGNOSIS — J479 Bronchiectasis, uncomplicated: Secondary | ICD-10-CM | POA: Diagnosis not present

## 2022-01-02 DIAGNOSIS — I272 Pulmonary hypertension, unspecified: Secondary | ICD-10-CM | POA: Diagnosis not present

## 2022-01-02 DIAGNOSIS — R197 Diarrhea, unspecified: Secondary | ICD-10-CM | POA: Diagnosis not present

## 2022-01-02 DIAGNOSIS — I10 Essential (primary) hypertension: Secondary | ICD-10-CM | POA: Diagnosis not present

## 2022-01-02 DIAGNOSIS — Z681 Body mass index (BMI) 19 or less, adult: Secondary | ICD-10-CM | POA: Diagnosis not present

## 2022-01-20 ENCOUNTER — Emergency Department (HOSPITAL_COMMUNITY): Payer: Medicare Other

## 2022-01-20 ENCOUNTER — Emergency Department (HOSPITAL_COMMUNITY)
Admission: EM | Admit: 2022-01-20 | Discharge: 2022-01-20 | Disposition: A | Discharge status: Home / Self Care | Payer: Medicare Other | Attending: Emergency Medicine | Admitting: Emergency Medicine

## 2022-01-20 ENCOUNTER — Other Ambulatory Visit: Payer: Self-pay

## 2022-01-20 ENCOUNTER — Encounter (HOSPITAL_COMMUNITY): Payer: Self-pay

## 2022-01-20 DIAGNOSIS — Z789 Other specified health status: Secondary | ICD-10-CM | POA: Diagnosis not present

## 2022-01-20 DIAGNOSIS — R531 Weakness: Secondary | ICD-10-CM | POA: Diagnosis not present

## 2022-01-20 DIAGNOSIS — R609 Edema, unspecified: Secondary | ICD-10-CM | POA: Diagnosis not present

## 2022-01-20 DIAGNOSIS — Z7901 Long term (current) use of anticoagulants: Secondary | ICD-10-CM | POA: Insufficient documentation

## 2022-01-20 DIAGNOSIS — I509 Heart failure, unspecified: Secondary | ICD-10-CM | POA: Insufficient documentation

## 2022-01-20 DIAGNOSIS — R0602 Shortness of breath: Secondary | ICD-10-CM | POA: Diagnosis not present

## 2022-01-20 DIAGNOSIS — E871 Hypo-osmolality and hyponatremia: Secondary | ICD-10-CM | POA: Diagnosis not present

## 2022-01-20 DIAGNOSIS — Z299 Encounter for prophylactic measures, unspecified: Secondary | ICD-10-CM | POA: Diagnosis not present

## 2022-01-20 DIAGNOSIS — I5030 Unspecified diastolic (congestive) heart failure: Secondary | ICD-10-CM | POA: Diagnosis not present

## 2022-01-20 DIAGNOSIS — J90 Pleural effusion, not elsewhere classified: Secondary | ICD-10-CM | POA: Diagnosis not present

## 2022-01-20 DIAGNOSIS — R7989 Other specified abnormal findings of blood chemistry: Secondary | ICD-10-CM | POA: Diagnosis not present

## 2022-01-20 DIAGNOSIS — R5383 Other fatigue: Secondary | ICD-10-CM | POA: Diagnosis not present

## 2022-01-20 DIAGNOSIS — R0989 Other specified symptoms and signs involving the circulatory and respiratory systems: Secondary | ICD-10-CM | POA: Diagnosis not present

## 2022-01-20 DIAGNOSIS — R600 Localized edema: Secondary | ICD-10-CM | POA: Insufficient documentation

## 2022-01-20 DIAGNOSIS — R935 Abnormal findings on diagnostic imaging of other abdominal regions, including retroperitoneum: Secondary | ICD-10-CM | POA: Diagnosis not present

## 2022-01-20 LAB — CBC WITH DIFFERENTIAL/PLATELET
Abs Immature Granulocytes: 0.02 10*3/uL (ref 0.00–0.07)
Basophils Absolute: 0.1 10*3/uL (ref 0.0–0.1)
Basophils Relative: 1 %
Eosinophils Absolute: 0.1 10*3/uL (ref 0.0–0.5)
Eosinophils Relative: 2 %
HCT: 42.5 % (ref 36.0–46.0)
Hemoglobin: 13.3 g/dL (ref 12.0–15.0)
Immature Granulocytes: 0 %
Lymphocytes Relative: 16 %
Lymphs Abs: 0.9 10*3/uL (ref 0.7–4.0)
MCH: 27.8 pg (ref 26.0–34.0)
MCHC: 31.3 g/dL (ref 30.0–36.0)
MCV: 88.7 fL (ref 80.0–100.0)
Monocytes Absolute: 0.7 10*3/uL (ref 0.1–1.0)
Monocytes Relative: 12 %
Neutro Abs: 4.1 10*3/uL (ref 1.7–7.7)
Neutrophils Relative %: 69 %
Platelets: 263 10*3/uL (ref 150–400)
RBC: 4.79 MIL/uL (ref 3.87–5.11)
RDW: 13.7 % (ref 11.5–15.5)
WBC: 5.9 10*3/uL (ref 4.0–10.5)
nRBC: 0 % (ref 0.0–0.2)

## 2022-01-20 LAB — COMPREHENSIVE METABOLIC PANEL
ALT: 18 U/L (ref 0–44)
AST: 25 U/L (ref 15–41)
Albumin: 3.8 g/dL (ref 3.5–5.0)
Alkaline Phosphatase: 95 U/L (ref 38–126)
Anion gap: 10 (ref 5–15)
BUN: 15 mg/dL (ref 8–23)
CO2: 31 mmol/L (ref 22–32)
Calcium: 9.8 mg/dL (ref 8.9–10.3)
Chloride: 92 mmol/L — ABNORMAL LOW (ref 98–111)
Creatinine, Ser: 0.75 mg/dL (ref 0.44–1.00)
GFR, Estimated: >60 mL/min (ref >60)
Glucose, Bld: 128 mg/dL — ABNORMAL HIGH (ref 70–99)
Potassium: 4.4 mmol/L (ref 3.5–5.1)
Sodium: 133 mmol/L — ABNORMAL LOW (ref 135–145)
Total Bilirubin: 1.2 mg/dL (ref 0.3–1.2)
Total Protein: 8.1 g/dL (ref 6.5–8.1)

## 2022-01-20 LAB — URINALYSIS, ROUTINE W REFLEX MICROSCOPIC
Bilirubin Urine: NEGATIVE
Glucose, UA: NEGATIVE mg/dL
Hgb urine dipstick: NEGATIVE
Ketones, ur: NEGATIVE mg/dL
Leukocytes,Ua: NEGATIVE
Nitrite: NEGATIVE
Protein, ur: NEGATIVE mg/dL
Specific Gravity, Urine: 1.005 (ref 1.005–1.030)
pH: 7 (ref 5.0–8.0)

## 2022-01-20 LAB — BRAIN NATRIURETIC PEPTIDE: B Natriuretic Peptide: 510 pg/mL — ABNORMAL HIGH (ref 0.0–100.0)

## 2022-01-20 LAB — TROPONIN I (HIGH SENSITIVITY)
Troponin I (High Sensitivity): 8 ng/L (ref <18)
Troponin I (High Sensitivity): 7 ng/L (ref <18)

## 2022-01-20 MED ORDER — FUROSEMIDE 10 MG/ML IJ SOLN
20.0000 mg | Freq: Once | INTRAMUSCULAR | Status: AC
  Administered 2022-01-20: 20 mg via INTRAVENOUS
  Filled 2022-01-20: qty 2

## 2022-01-20 NOTE — ED Provider Notes (Signed)
[facility] EMERGENCY DEPARTMENT Provider Note   CSN: [id number] Arrival date & time: 01/20/22  1509     History {Add pertinent medical, surgical, social history, OB history to HPI:1} Chief Complaint  Patient presents with   Weakness    Shelly Fields is a 87 y.o. female.  Patient has a history of congestive heart failure.  She complains of more swelling in her ankles and just weakness   Weakness      Home Medications Prior to Admission medications   Medication Sig Start Date End Date Taking? Authorizing Provider  ALPRAZolam Duanne Moron) 0.25 MG tablet Take 0.25 mg by mouth daily as needed for sleep. 01/02/22  Yes [provider]  Carboxymethylcellulose Sodium (THERATEARS OP) Place 1 drop into both eyes daily.    Yes [provider]  denosumab (PROLIA) 60 MG/ML SOSY injection Inject 60 mg into the skin every 6 (six) months.   Yes [provider]  furosemide (LASIX) 20 MG tablet TAKE 1 TABLET BY MOUTH  DAILY, AND INCREASE TO 2  TABS DAILY FOR A FEW DAYS  FOR WEIGHT GAIN 2-3 LBS IN  24 HRS OR 5 LBS IN A WEEK. Patient taking differently: Take 20 mg by mouth See admin instructions. Take 1 tablet by mouth daily. Increase 2 tablets daily for a few days for weight gain 2-3 lbs in 24 hours or 5 lbs in a week 05/19/21  Yes Satira Sark, MD  levothyroxine (SYNTHROID) 88 MCG tablet Take 88 mcg by mouth daily. 12/11/21  Yes [provider]  metoprolol succinate (TOPROL-XL) 50 MG 24 hr tablet TAKE 1 AND 1/2 TABLETS BY  MOUTH TWICE DAILY 10/01/21  Yes Satira Sark, MD  Multiple Vitamin (MULTIVITAMIN) tablet Take 1 tablet by mouth daily.   Yes [provider]  psyllium (METAMUCIL) 58.6 % packet Take 1 packet by mouth daily.   Yes [provider]  Rivaroxaban (XARELTO) 15 MG TABS tablet Take 1 tablet (15 mg total) by mouth daily with supper. 12/23/21  Yes Satira Sark, MD  levothyroxine (SYNTHROID) 75 MCG tablet Take 75 mcg by mouth  daily. Patient not taking: Reported on 01/20/2022 10/20/21   [provider]  torsemide (DEMADEX) 20 MG tablet Take by mouth. Patient not taking: Reported on 01/20/2022 12/11/21   [provider]      Allergies    Patient has no known allergies.    Review of Systems   Review of Systems  Neurological:  Positive for weakness.    Physical Exam Updated Vital Signs BP (!) 149/73   Pulse 89   Temp 97.6 F (36.4 C) (Oral)   Resp (!) 22   SpO2 99%  Physical Exam  ED Results / Procedures / Treatments   Labs (all labs ordered are listed, but only abnormal results are displayed) Labs Reviewed  COMPREHENSIVE METABOLIC PANEL - Abnormal; Notable for the following components:      Result Value   Sodium 133 (*)    Chloride 92 (*)    Glucose, Bld 128 (*)    All other components within normal limits  BRAIN NATRIURETIC PEPTIDE - Abnormal; Notable for the following components:   B Natriuretic Peptide 510.0 (*)    All other components within normal limits  URINALYSIS, ROUTINE W REFLEX MICROSCOPIC - Abnormal; Notable for the following components:   Color, Urine STRAW (*)    All other components within normal limits  CBC WITH DIFFERENTIAL/PLATELET  TROPONIN I (HIGH SENSITIVITY)  TROPONIN I (HIGH  SENSITIVITY)    EKG None  Radiology DG Chest Port 1 View  Result Date: 01/20/2022 CLINICAL DATA:  Shortness of breath EXAM: PORTABLE CHEST 1 VIEW COMPARISON:  Same day chest radiograph FINDINGS: Unchanged cardiomegaly. Right middle lobe opacity, unchanged when compared with prior exam and likely due to chronic atypical infection. Small right pleural effusion. No new focal consolidation. No evidence of pneumothorax. IMPRESSION: 1. No new airspace opacity. 2. Unchanged right middle lobe consolidation, likely related to chronic atypical infection. 3. Small right pleural effusion. Electronically Signed   By: Yetta Glassman M.D.   On: 01/20/2022 17:52    Procedures Procedures   {Document cardiac monitor, telemetry assessment procedure when appropriate:1}  Medications Ordered in ED Medications  furosemide (LASIX) injection 20 mg (20 mg Intravenous Given 01/20/22 1809)    ED Course/ Medical Decision Making/ A&P  Patient with worsening congestive heart failure.  She improved with IV Lasix and will be discharged home                         Medical Decision Making Amount and/or Complexity of Data Reviewed Labs: ordered. Radiology: ordered. ECG/medicine tests: ordered.  Patient with worsening congestive heart failure.  We will increase her Lasix to 2 pills a day for 2 days  {Document critical care time when appropriate:1} {Document review of labs and clinical decision tools ie heart score, Chads2Vasc2 etc:1}  {Document your independent review of radiology images, and any outside records:1} {Document your discussion with family members, caretakers, and with consultants:1} {Document social determinants of health affecting pt's care:1} {Document your decision making why or why not admission, treatments were needed:1} Final Clinical Impression(s) / ED Diagnoses Final diagnoses:  Weakness    Rx / DC Orders ED Discharge Orders     None

## 2022-01-20 NOTE — ED Notes (Signed)
EDP made aware of pt's BP 

## 2022-01-20 NOTE — Discharge Instructions (Signed)
Increase your fluid pill so he take 2 pills a day for the next 2 days.  Then you can go back to taking 1 pill a day.  Follow-up with your doctor by next week

## 2022-01-20 NOTE — ED Notes (Signed)
Pt had to urgently urinate. Pt's family member assisted pt to urinate in the trash can. Pt placed on purewick

## 2022-01-20 NOTE — ED Provider Triage Note (Signed)
Emergency Medicine Provider Triage Evaluation Note  Shelly Fields , a 87 y.o. female  was evaluated in triage.  Pt complains of swelling in her legs.  Pt saw her md and was sent to lab for blood work and chest xray.  Pt's Md called her and advised her she is in chf and needed to come to ED.    Review of Systems  Positive: Ankle swelling and cough Negative: No fever,   Physical Exam  BP (!) 160/76   Pulse 96   Temp 97.6 F (36.4 C) (Oral)   Resp 16   SpO2 100%  Gen:   Awake, no distress   Resp:  Normal effort  MSK:   Moves extremities without difficulty  Other:  Heart irregular rate.   Medical Decision Making  Medically screening exam initiated at 3:45 PM.  Appropriate orders placed.  Shelly Fields Shelly Fields was informed that the remainder of the evaluation will be completed by another provider, this initial triage assessment does not replace that evaluation, and the importance of remaining in the ED until their evaluation is complete.     Fransico Meadow, [place] 01/20/22 1828

## 2022-01-20 NOTE — ED Triage Notes (Addendum)
Son reports that patient has been very tired recently. States that she has swelling in right ankle. Noted bilaterally to this nurse. Cough- son states is usual. Sent to [facility] for work-up and called by PCP and told to come to ED for CHF. Noted small pleural effusion on CXR from [facility].

## 2022-01-22 ENCOUNTER — Telehealth: Payer: Self-pay | Admitting: Cardiology

## 2022-01-22 DIAGNOSIS — I10 Essential (primary) hypertension: Secondary | ICD-10-CM | POA: Diagnosis not present

## 2022-01-22 DIAGNOSIS — I4891 Unspecified atrial fibrillation: Secondary | ICD-10-CM | POA: Diagnosis not present

## 2022-01-22 DIAGNOSIS — Z789 Other specified health status: Secondary | ICD-10-CM | POA: Diagnosis not present

## 2022-01-22 DIAGNOSIS — I5032 Chronic diastolic (congestive) heart failure: Secondary | ICD-10-CM | POA: Diagnosis not present

## 2022-01-22 DIAGNOSIS — Z299 Encounter for prophylactic measures, unspecified: Secondary | ICD-10-CM | POA: Diagnosis not present

## 2022-01-22 DIAGNOSIS — Z7689 Persons encountering health services in other specified circumstances: Secondary | ICD-10-CM | POA: Diagnosis not present

## 2022-01-22 NOTE — Progress Notes (Signed)
Cardiology Office Note:    Date:  01/23/2022   ID:  Shelly Fields, DOB 1935-03-16, MRN [id number]  PCP:  [facility] Providers Cardiologist:  Rozann Lesches, MD     Referring MD: Berenice Primas, NP   Chief Complaint: concerns for fluid overload  History of Present Illness:    Shelly Fields is a very pleasant 87 y.o. female with a hx of HTN, permanent atrial fibrillation on chronic anticoagulation, HFpE, and severe TR/pulmonary hypertension.  Initially seen in consultation at [facility] September 2014 with newly diagnosed atrial fibrillation which spontaneously converted to sinus rhythm.  Has been on flecainide in the past  Echo 05/22/2020 revealed LVEF 60 to 65%, significant RV enlargement as previously noted with biatrial enlargement and borderline elevation in pulmonary pressures.   She was last seen in our office on 12/23/2021 by Dr. Domenic Polite.  He reported stable CHF symptoms, NYHA class II-III.  Maintained on rate control and Xarelto for stroke prophylaxis for permanent a fib.  Advised to follow-up in 6 months.  Her granddaughter called our office on 01/22/2022 to report patient was in the hospital for weight gain of 4 pounds in 2 days.  She is originally went to hospital in [place] under advisement of PCP and NP was 3172 (reviewed and found to be a pro-BNP).  Was then sent to [facility] a few hours later and BNP was 510 (this was BNP normal range 0-100).  She was put on Lasix 20 mg 2 tablets daily and per granddaughter weight has decreased. She was scheduled for office visit.  Today, she is here with her son. Reports on 8/15 she was concerned about fatigue and fluid overload.  Contacted her PCP who advised her to go to [facility] where she had BNP checked.  There was concern for appropriate care at that location and she was advised to go to [facility].  Initially thought BNP was much lower, however upon review these would be  similar readings based on normal range for each health system. On that day she was having some increased SOB and leg swelling. Gets fatigued easily. She was under the impression that she needed to go to the hospital if her weight increased 2 lbs. Remains active by walking with friends. Lives alone but 2 sons and a granddaughter are very close and check on her regularly. Today, she reports concerns about fluid status. She denies chest pain, shortness of breath, palpitations, melena, hematuria, hemoptysis, diaphoresis, weakness, presyncope, syncope, orthopnea, and PND.    Past Medical History:  Diagnosis Date   Cor pulmonale (chronic) (HCC)    Diverticulosis    Essential hypertension    Hypothyroidism    Internal hemorrhoids    Persistent atrial fibrillation ([place])    Pulmonary hypertension (HCC)    Seasonal allergies    Shingles     Past Surgical History:  Procedure Laterality Date   ABDOMINAL HYSTERECTOMY     CATARACT EXTRACTION     COLONOSCOPY N/A 01/12/2013   Procedure: COLONOSCOPY;  Surgeon: Rogene Houston, MD;  Location: [facility];  Service: Endoscopy;  Laterality: N/A;  1030   COLONOSCOPY N/A 11/18/2017   Procedure: COLONOSCOPY;  Surgeon: Rogene Houston, MD;  Location: [facility];  Service: Endoscopy;  Laterality: N/A;   POLYPECTOMY  11/18/2017   Procedure: POLYPECTOMY;  Surgeon: Rogene Houston, MD;  Location: [facility];  Service: Endoscopy;;  colon    Current Medications:  Current Meds  Medication Sig   ALPRAZolam (XANAX) 0.25 MG tablet Take 0.25 mg by mouth daily as needed for sleep.   Carboxymethylcellulose Sodium (THERATEARS OP) Place 1 drop into both eyes daily.    denosumab (PROLIA) 60 MG/ML SOSY injection Inject 60 mg into the skin every 6 (six) months.   furosemide (LASIX) 20 MG tablet TAKE 1 TABLET BY MOUTH  DAILY, AND INCREASE TO 2  TABS DAILY FOR A FEW DAYS  FOR WEIGHT GAIN 2-3 LBS IN  24 HRS OR 5 LBS IN A WEEK.   levothyroxine (SYNTHROID) 88 MCG tablet  Take 88 mcg by mouth daily.   metoprolol succinate (TOPROL-XL) 50 MG 24 hr tablet TAKE 1 AND 1/2 TABLETS BY  MOUTH TWICE DAILY   psyllium (METAMUCIL) 58.6 % packet Take 1 packet by mouth daily.   Rivaroxaban (XARELTO) 15 MG TABS tablet Take 1 tablet (15 mg total) by mouth daily with supper.     Allergies:   Patient has no known allergies.   Social History   Socioeconomic History   Marital status: Widowed    Spouse name: Not on file   Number of children: Not on file   Years of education: Not on file   Highest education level: Not on file  Occupational History   Not on file  Tobacco Use   Smoking status: Never   Smokeless tobacco: Never  Vaping Use   Vaping Use: Never used  Substance and Sexual Activity   Alcohol use: No    Alcohol/week: 0.0 standard drinks of alcohol   Drug use: No   Sexual activity: Not on file  Other Topics Concern   Not on file  Social History Narrative   Not on file   Social Determinants of Health   Financial Resource Strain: Not on file  Food Insecurity: Not on file  Transportation Needs: Not on file  Physical Activity: Not on file  Stress: Not on file  Social Connections: Not on file     Family History: The patient's family history includes Heart disease in her mother; Hypertension in her father. There is no history of Colon cancer.  ROS:   Please see the history of present illness.    + bilateral LE edema  All other systems reviewed and are negative.  Labs/Other Studies Reviewed:    The following studies were reviewed today:   Echo 05/22/2020  1. Left ventricular ejection fraction, by estimation, is 60 to 65%. The  left ventricle has normal function. The left ventricle has no regional  wall motion abnormalities. Left ventricular diastolic parameters are  indeterminate.   2. Right ventricular systolic function is low normal. The right  ventricular size is severely enlarged.   3. Left atrial size was severely dilated.   4. Right  atrial size was severely dilated.   5. The pericardial effusion is circumferential.   6. The mitral valve is normal in structure. No evidence of mitral valve  regurgitation. No evidence of mitral stenosis.   7. The tricuspid valve is abnormal. Tricuspid valve regurgitation is  severe.   8. The aortic valve is tricuspid. Aortic valve regurgitation is mild. No  aortic stenosis is present.   9. Borderline pulmonary HTN, PASP is 30 mmHg.  10. The inferior vena cava is dilated in size with <50% respiratory  variability, suggesting right atrial pressure of 15 mmHg.   Comparison(s): Echocardiogram done at [facility] on 08/17/18 showed an EF of  60-65%.   Recent Labs: 01/20/2022: ALT 18;  B Natriuretic Peptide 510.0; BUN 15; Creatinine, Ser 0.75; Hemoglobin 13.3; Platelets 263; Potassium 4.4; Sodium 133  Recent Lipid Panel No results found for: "CHOL", "TRIG", "HDL", "CHOLHDL", "VLDL", "LDLCALC", "LDLDIRECT"   Risk Assessment/Calculations:    CHA2DS2-VASc Score = 5  This indicates a 7.2% annual risk of stroke. The patient's score is based upon: CHF History: 1 HTN History: 1 Diabetes History: 0 Stroke History: 0 Vascular Disease History: 0 Age Score: 2 Gender Score: 1    Physical Exam:    VS:  BP 132/78   Pulse 81   Ht '5\' 2"'$  (1.575 m)   Wt 100 lb 12.8 oz (45.7 kg)   SpO2 96%   BMI 18.44 kg/m     Wt Readings from Last 3 Encounters:  01/23/22 100 lb 12.8 oz (45.7 kg)  12/23/21 102 lb (46.3 kg)  11/27/21 99 lb 9.6 oz (45.2 kg)     GEN: Chronically ill appearing female in no acute distress HEENT: Normal NECK: No JVD; No carotid bruits CARDIAC: Irregular RR, no murmurs, rubs, gallops RESPIRATORY:  Clear to auscultation without rales, wheezing or rhonchi  ABDOMEN: Soft, non-tender, non-distended MUSCULOSKELETAL:  Bilateral 2+ pitting LE edema. Blue discoloration. No deformity. 2+ pedal pulses, equal bilaterally SKIN: Warm and dry NEUROLOGIC:  Alert and oriented x 3 PSYCHIATRIC:   Normal affect   EKG:  EKG is not ordered today.    Diagnoses:    1. Persistent atrial fibrillation ([place])   2. Chronic anticoagulation   3. Weight gain   4. Bilateral leg edema   5. Chronic fatigue    Assessment and Plan:     Weight gain/swelling: She is concerned about recent weight gain and swelling. Bilateral 2+ pitting LE edema. Previously advised to go to the hospital with weight gain of 2 lbs. I offered guidance to record daily weight with advice to increase Lasix to 40 mg if weight is increased > 2 lbs in 24 hours and if weight does not decrease the following day, to call our office.  She is careful to avoid high sodium foods.  She is walking frequently throughout the day. Likely that some swelling 2/2 venous insufficiency.  Emphasized the importance of daily weight and symptom monitoring instead of relying on leg swelling alone. Her son seems very involved in was given a set of instructions as well.  Persistent atrial fibrillation on chronic anticoagulation: Rate control and anticoagulation has been strategy per primary cardiologist. Advised patient and son to monitor home HR and notify us if HR is consistently > 120 bpm. No bleeding concerns.  Continue metoprolol, Xarelto.  Chronic HFpEF: LVEF 60 to 65%, significant RV enlargement, biatrial enlargement, indeterminate diastolic parameters on echo 05/2020.  As noted above, she has chronic bilateral lower extremity edema. Otherwise appears euvolemic on exam today. Denies dyspnea, orthopnea, PND. Advised her to monitor weight and symptoms of CHF rather than gauging fluid status by leg swelling alone. Parameters for additional Lasix given as noted in #1. Would favor repeat echo if she calls to report worsening symptoms.  We will have her return for soon follow-up.  Fatigue: Likely multifactorial with persistent atrial fibrillation, age, and deconditioning. She continues to do ADLs and goes out walking with a group of women. Encouraged her to  continue regular physical activity with the caution to rest when fatigued.   Disposition: 6 weeks with APP/Keep your appointment in January with Dr. Domenic Polite   Medication Adjustments/Labs and Tests Ordered: Current medicines are reviewed at length with the patient  today.  Concerns regarding medicines are outlined above.  No orders of the defined types were placed in this encounter.  No orders of the defined types were placed in this encounter.   Patient Instructions  Medication Instructions:  Your physician recommends that you continue on your current medications as directed. Please refer to the Current Medication list given to you today.  *If you need a refill on your cardiac medications before your next appointment, please call your pharmacy*   Lab Work: None Ordered If you have labs (blood work) drawn today and your tests are completely normal, you will receive your results only by: [place] (if you have MyChart) OR A paper copy in the mail If you have any lab test that is abnormal or we need to change your treatment, we will call you to review the results.   Testing/Procedures:    Follow-Up: At [facility], you and your health needs are our priority.  As part of our continuing mission to provide you with exceptional heart care, we have created designated Provider Care Teams.  These Care Teams include your primary Cardiologist (physician) and Advanced Practice Providers (APPs -  Physician Assistants and Nurse Practitioners) who all work together to provide you with the care you need, when you need it.  We recommend signing up for the patient portal called "MyChart".  Sign up information is provided on this After Visit Summary.  MyChart is used to connect with patients for Virtual Visits (Telemedicine).  Patients are able to view lab/test results, encounter notes, upcoming appointments, etc.  Non-urgent messages can be sent to your provider as well.   To learn more  about what you can do with MyChart, go to [email or web address].    Your next appointment:   Keep your appointment in January with Dr. Domenic Polite 4 month(s)  The format for your next appointment:   In Person  Provider:   Rozann Lesches, MD    Other Instructions Heart Failure Education: Weigh yourself EVERY morning after you go to the bathroom but before you eat or drink anything. Write this number down in a weight log/diary. If you gain 3 pounds overnight or 5 pounds in a week, call the office. Take your medicines as prescribed. If you have concerns about your medications, please call us before you stop taking them.  Eat low salt foods--Limit salt (sodium) to 2000 mg per day. Eat some healthy sources of sodium (pretzels, V8 juice, salt your vegetables)This will help prevent your body from holding onto fluid. Read food labels as many processed foods have a lot of sodium, especially canned goods and prepackaged meats. If you would like some assistance choosing low sodium foods, we would be happy to set you up with a nutritionist. Stay as active as you can everyday. Staying active will give you more energy and make your muscles stronger. Start with 5 minutes at a time and work your way up to 30 minutes a day. Break up your activities--do some in the morning and some in the afternoon. Start with 3 days per week and work your way up to 5 days as you can.  If you have chest pain, feel short of breath, dizzy, or lightheaded, STOP. If you don't feel better after a short rest, call 911. If you do feel better, call the office to let us know you have symptoms with exercise. Limit all fluids for the day to less than 2 liters. Fluid includes all drinks, coffee, juice, ice chips, soup, jello,  and all other liquids. If weight increases more than 2 lbs in 24 hours take an additional Lasix 20 mg tablet. If your weight does not go the next day, call our office.    For your  leg edema you  should do  the  following 1. Leg elevation - I recommend the Lounge Dr. Leg rest.  See below for details  2. Salt restriction  -  Use potassium chloride instead of regular salt as a salt substitute. 3. Walk regularly 4. Compression hose - Medical Supply store  5. Weight loss    Available on [email or web address] Or  Go to [email or web address]      Important Information About Sugar         Signed, Emmaline Life, NP  01/23/2022 4:51 PM     [facility]

## 2022-01-22 NOTE — Telephone Encounter (Signed)
Patient's granddaughter is calling stating the patient was in the hospital for swelling due to having weight gain of 4 lbs in two days. She states she originally went to the hospital in [place] per her PCP sending her there for testing and labs and her BMP came back as 3,172. She then went to the [facility] a few hours later and her BMP was 510. She was put on 20 MG Lasix two tablets daily and sent home. Patient's weight has came down to 100 lbs, but tomorrow is her last day of the increased dose of two tablets. Patient's PCP advised they call regarding this and see if Dr. Domenic Polite is wanting to work her in for a hospital f/u. Please advise.

## 2022-01-22 NOTE — Telephone Encounter (Signed)
Patient contacted to offer ED f/u appointment with Christen Bame NP for 01/23/2022 '@2'$ :20 pm at [facility]. Location in [place] and accepted. Advised patient that she needed to fill out a new DPR at visit so that we could speak with grand-daughter if she called. Verbalized understanding.

## 2022-01-23 ENCOUNTER — Ambulatory Visit: Payer: Medicare Other | Admitting: Nurse Practitioner

## 2022-01-23 ENCOUNTER — Encounter: Payer: Self-pay | Admitting: Nurse Practitioner

## 2022-01-23 VITALS — BP 132/78 | HR 81 | Ht 62.0 in | Wt 100.8 lb

## 2022-01-23 DIAGNOSIS — I4819 Other persistent atrial fibrillation: Secondary | ICD-10-CM | POA: Diagnosis not present

## 2022-01-23 DIAGNOSIS — R5382 Chronic fatigue, unspecified: Secondary | ICD-10-CM

## 2022-01-23 DIAGNOSIS — R600 Localized edema: Secondary | ICD-10-CM

## 2022-01-23 DIAGNOSIS — R635 Abnormal weight gain: Secondary | ICD-10-CM | POA: Diagnosis not present

## 2022-01-23 DIAGNOSIS — Z7901 Long term (current) use of anticoagulants: Secondary | ICD-10-CM

## 2022-01-23 NOTE — Patient Instructions (Signed)
Medication Instructions:  Your physician recommends that you continue on your current medications as directed. Please refer to the Current Medication list given to you today.  *If you need a refill on your cardiac medications before your next appointment, please call your pharmacy*   Lab Work: None Ordered If you have labs (blood work) drawn today and your tests are completely normal, you will receive your results only by: [place] (if you have MyChart) OR A paper copy in the mail If you have any lab test that is abnormal or we need to change your treatment, we will call you to review the results.   Testing/Procedures:    Follow-Up: At [facility], you and your health needs are our priority.  As part of our continuing mission to provide you with exceptional heart care, we have created designated Provider Care Teams.  These Care Teams include your primary Cardiologist (physician) and Advanced Practice Providers (APPs -  Physician Assistants and Nurse Practitioners) who all work together to provide you with the care you need, when you need it.  We recommend signing up for the patient portal called "MyChart".  Sign up information is provided on this After Visit Summary.  MyChart is used to connect with patients for Virtual Visits (Telemedicine).  Patients are able to view lab/test results, encounter notes, upcoming appointments, etc.  Non-urgent messages can be sent to your provider as well.   To learn more about what you can do with MyChart, go to [email or web address].    Your next appointment:   Keep your appointment in January with Dr. Domenic Polite 4 month(s)  The format for your next appointment:   In Person  Provider:   Rozann Lesches, MD    Other Instructions Heart Failure Education: Weigh yourself EVERY morning after you go to the bathroom but before you eat or drink anything. Write this number down in a weight log/diary. If you gain 3 pounds overnight or 5 pounds in  a week, call the office. Take your medicines as prescribed. If you have concerns about your medications, please call us before you stop taking them.  Eat low salt foods--Limit salt (sodium) to 2000 mg per day. Eat some healthy sources of sodium (pretzels, V8 juice, salt your vegetables)This will help prevent your body from holding onto fluid. Read food labels as many processed foods have a lot of sodium, especially canned goods and prepackaged meats. If you would like some assistance choosing low sodium foods, we would be happy to set you up with a nutritionist. Stay as active as you can everyday. Staying active will give you more energy and make your muscles stronger. Start with 5 minutes at a time and work your way up to 30 minutes a day. Break up your activities--do some in the morning and some in the afternoon. Start with 3 days per week and work your way up to 5 days as you can.  If you have chest pain, feel short of breath, dizzy, or lightheaded, STOP. If you don't feel better after a short rest, call 911. If you do feel better, call the office to let us know you have symptoms with exercise. Limit all fluids for the day to less than 2 liters. Fluid includes all drinks, coffee, juice, ice chips, soup, jello, and all other liquids. If weight increases more than 2 lbs in 24 hours take an additional Lasix 20 mg tablet. If your weight does not go the next day, call our office.    For  your  leg edema you  should do  the following 1. Leg elevation - I recommend the Lounge Dr. Leg rest.  See below for details  2. Salt restriction  -  Use potassium chloride instead of regular salt as a salt substitute. 3. Walk regularly 4. Compression hose - Medical Supply store  5. Weight loss    Available on [email or web address] Or  Go to [email or web address]      Important Information About Sugar

## 2022-01-27 ENCOUNTER — Encounter (INDEPENDENT_AMBULATORY_CARE_PROVIDER_SITE_OTHER): Payer: Self-pay | Admitting: Gastroenterology

## 2022-01-27 ENCOUNTER — Ambulatory Visit (INDEPENDENT_AMBULATORY_CARE_PROVIDER_SITE_OTHER): Payer: Medicare Other | Admitting: Gastroenterology

## 2022-01-27 VITALS — BP 178/101 | HR 90 | Temp 97.4°F | Ht 62.0 in | Wt 102.7 lb

## 2022-01-27 DIAGNOSIS — R195 Other fecal abnormalities: Secondary | ICD-10-CM

## 2022-01-27 DIAGNOSIS — K625 Hemorrhage of anus and rectum: Secondary | ICD-10-CM | POA: Diagnosis not present

## 2022-01-27 DIAGNOSIS — K643 Fourth degree hemorrhoids: Secondary | ICD-10-CM | POA: Diagnosis not present

## 2022-01-27 MED ORDER — HYDROCORTISONE (PERIANAL) 2.5 % EX CREA: 1.0000 | TOPICAL_CREAM | Freq: Three times a day (TID) | CUTANEOUS | 1 refills | Status: DC

## 2022-01-27 NOTE — Patient Instructions (Signed)
It was nice to meet you! Please continue metamucil and plenty of water Avoid straining when having a Bowel movement and try to avoid sitting on the toilet longer than 5 minutes at a time I am sending anusol for your hemorrhoids, you can apply externally and put some on a qtip to insert into your rectal area as well. This will help to decrease swelling/irritation of hemorrhoids.  Please let me know once Blood pressure and swelling are stable so we can schedule colonoscopy  Follow up 3 months

## 2022-01-27 NOTE — Progress Notes (Signed)
Referring Provider: Berenice Primas, NP Primary Care Physician:  Berenice Primas Primary GI Physician: new  Chief Complaint  Patient presents with   Hemorrhoids    New patient. Referred for rectal bleeding and hemorrhoids.    HPI:   Shelly Fields is a 87 y.o. female with past medical history of cor pulmonale, HTN, hypothyroidism, A fib, pulmonary HTN.   Patient presenting today as a new patient for rectal bleeding, change in stool caliber.   Last labs 01/20/22 with sodium 133, potassium 4.4, renal function and LFTs WNL, WBC 5.9, hgb 13.3, plt 263k  Patient reports she had diarrhea x2 weeks in June, she had upcoming physical with PCP so she waited to discuss this with her then. She also noticed very skinny stools, the diameter of her pinky finger. She has long history of hemorrhoids since she was a teenager. She notes she was seeing fresh blood on the toilet tissue. At baseline she has had to take a stool softener and eat a lot of veggies to keep bowels moving regularly. She states that diarrhea resolved, Patient states she started on metamucil as directed by her PCP and stools seem closer back to normal though they are still somewhat skinny in diameter. She denies any abdominal. She has occasional toilet tissue hematochezia but not every time. Denies any weight loss. No melena. Feels that appetite is good. She denies any pain, itching or burning to rectal area.   She does note that she has more fluid in her legs from her CHF. She is currently on lasix for this. Also with elevated BP today in clinic which is abnormal for her. She denies any headache, blurred vision, dizziness or mental status changes.   NSAID use: none Social hx:no etoh or tobacco  Fam hx: no crc, liver disease or pancreatic cancer   Last Colonoscopy:11/18/17 - Perianal skin tags found on perianal exam. - One small polyp in the proximal transverse colon. Biopsied-tubular adenoma  - Diverticulosis in the sigmoid  colon. - Melanosis in the colon. - External and internal hemorrhoids. Last Endoscopy: never  Recommendations:    Past Medical History:  Diagnosis Date   Cor pulmonale (chronic) (HCC)    Diverticulosis    Essential hypertension    Hypothyroidism    Internal hemorrhoids    Persistent atrial fibrillation (HCC)    Pulmonary hypertension (HCC)    Seasonal allergies    Shingles     Past Surgical History:  Procedure Laterality Date   ABDOMINAL HYSTERECTOMY     CATARACT EXTRACTION     COLONOSCOPY N/A 01/12/2013   Procedure: COLONOSCOPY;  Surgeon: Rogene Houston, MD;  [facility]: [facility];  Service: Endoscopy;  Laterality: N/A;  1030   COLONOSCOPY N/A 11/18/2017   Procedure: COLONOSCOPY;  Surgeon: Rogene Houston, MD;  Location: [facility];  Service: Endoscopy;  Laterality: N/A;   POLYPECTOMY  11/18/2017   Procedure: POLYPECTOMY;  Surgeon: Rogene Houston, MD;  Location: [facility];  Service: Endoscopy;;  colon    Current Outpatient Medications  Medication Sig Dispense Refill   ALPRAZolam (XANAX) 0.25 MG tablet Take 0.25 mg by mouth daily as needed for sleep.     Carboxymethylcellulose Sodium (THERATEARS OP) Place 1 drop into both eyes daily.      denosumab (PROLIA) 60 MG/ML SOSY injection Inject 60 mg into the skin every 6 (six) months.     furosemide (LASIX) 20 MG tablet TAKE 1 TABLET BY MOUTH  DAILY, AND INCREASE  TO 2  TABS DAILY FOR A FEW DAYS  FOR WEIGHT GAIN 2-3 LBS IN  24 HRS OR 5 LBS IN A WEEK. 180 tablet 3   levothyroxine (SYNTHROID) 88 MCG tablet Take 88 mcg by mouth daily.     metoprolol succinate (TOPROL-XL) 50 MG 24 hr tablet TAKE 1 AND 1/2 TABLETS BY  MOUTH TWICE DAILY 270 tablet 3   OVER THE COUNTER MEDICATION Vit D3     psyllium (METAMUCIL) 58.6 % packet Take 1 packet by mouth daily.     Rivaroxaban (XARELTO) 15 MG TABS tablet Take 1 tablet (15 mg total) by mouth daily with supper. 28 tablet 0   No current facility-administered medications for this visit.     Allergies as of 01/27/2022   (No Known Allergies)    Family History  Problem Relation Age of Onset   Heart disease Mother    Hypertension Father    Colon cancer Neg Hx     Social History   Socioeconomic History   Marital status: Widowed    Spouse name: Not on file   Number of children: Not on file   Years of education: Not on file   Highest education level: Not on file  Occupational History   Not on file  Tobacco Use   Smoking status: Never    Passive exposure: Past   Smokeless tobacco: Never  Vaping Use   Vaping Use: Never used  Substance and Sexual Activity   Alcohol use: No    Alcohol/week: 0.0 standard drinks of alcohol   Drug use: No   Sexual activity: Not on file  Other Topics Concern   Not on file  Social History Narrative   Not on file   Social Determinants of Health   Financial Resource Strain: Not on file  Food Insecurity: Not on file  Transportation Needs: Not on file  Physical Activity: Not on file  Stress: Not on file  Social Connections: Not on file   Review of systems General: negative for malaise, night sweats, fever, chills, weight loss Neck: Negative for lumps, goiter, pain and significant neck swelling  Resp: Negative for cough, wheezing, dyspnea at rest CV: Negative for chest pain, leg swelling, palpitations, orthopnea GI: denies melena, nausea, vomiting, diarrhea, constipation, dysphagia, odyonophagia, early satiety or unintentional weight loss. +rectal bleeding +change in stool habits MSK: Negative for joint pain or swelling, back pain, and muscle pain. Derm: Negative for itching or rash Psych: Denies depression, anxiety, memory loss, confusion. No homicidal or suicidal ideation.  Heme: Negative for prolonged bleeding, bruising easily, and swollen nodes. Endocrine: Negative for cold or heat intolerance, polyuria, polydipsia and goiter. Neuro: negative for tremor, gait imbalance, syncope and seizures. The remainder of the review of  systems is noncontributory.  Physical Exam: BP (!) 178/101 (BP Location: Left Arm, Patient Position: Sitting, Cuff Size: Normal)   Pulse 90   Temp (!) 97.4 F (36.3 C) (Oral)   Ht '5\' 2"'$  (1.575 m)   Wt 102 lb 11.2 oz (46.6 kg)   BMI 18.78 kg/m  General:   Alert and oriented. No distress noted. Pleasant and cooperative.  Head/neck:  Normocephalic and atraumatic.+JVD  Eyes:  Conjuctiva clear without scleral icterus. Mouth:  Oral mucosa pink and moist. Good dentition. No lesions. Heart: Normal rate and rhythm, s1 and s2 heart sounds present.  Lungs: Clear lung sounds in all lobes. Respirations equal and unlabored. Abdomen:  +BS, soft, non-tender and non-distended. No rebound or guarding. No HSM or masses noted.  Rectal: Marisa Cyphers LPN present as witness, multiple inflamed hemorrhoids, grade III/IV present, no active bleeding but evidence of likely recent bleeding. No internal exam done due to presence of inflamed hemorrhoids  Derm: No palmar erythema or jaundice Msk:  Symmetrical without gross deformities. Normal posture. Extremities:  Without edema. Neurologic:  Alert and  oriented x4 Psych:  Alert and cooperative. Normal mood and affect.  Invalid input(s): "6 MONTHS"   ASSESSMENT: Shelly Fields is a 87 y.o. female presenting today as a new patient for rectal bleeding and change in stool caliber.   Patient with 2 week stent of diarrhea in June with change in stool caliber and rectal bleeding. Diarrhea seemed to resolve and she is now using metamucil daily with more regular BMs, noting continued occasional toilet tissue hematochezia and some small diameter stools still. Denies weight loss, melena, UGI symptoms, abdominal pain or changes in appetite. Rectal exam with multiple inflamed Grade III/IV hemorrhoids, she is not a candidate for hemorrhoid banding due to use of xarelto. Will send Rx anusol TID x10 days, she should continue metamucil with good result as well as good water intake.  Though rectal bleeding likely secondary to inflamed hemorrhoids, I did discuss recommendation to proceed with Colonoscopy for further evaluation of bleeding and change in stool caliber, as last was 4 years ago. The patient is amenable. We will need to wait until her BP is more stable and fluid status is also better managed. Indications, risks and benefits of procedure discussed in detail with patient. Patient verbalized understanding and is in agreement to proceed with Colonoscopy.     PLAN:  Schedule colonoscopy once BP and fluid status is stable, ASA III 2. Continue metamucil and good water intake  3. Rx Anusol TID x10 then PRN therefater 4. Avoid straining, limit toilet time to <5 minutes  All questions were answered, patient verbalized understanding and is in agreement with plan as outlined above.    Follow Up: 3 months  Berton Butrick L. Alver Sorrow, MSN, APRN, AGNP-C Adult-Gerontology Nurse Practitioner [facility]

## 2022-02-03 DIAGNOSIS — Z299 Encounter for prophylactic measures, unspecified: Secondary | ICD-10-CM | POA: Diagnosis not present

## 2022-02-03 DIAGNOSIS — R5383 Other fatigue: Secondary | ICD-10-CM | POA: Diagnosis not present

## 2022-02-03 DIAGNOSIS — R609 Edema, unspecified: Secondary | ICD-10-CM | POA: Diagnosis not present

## 2022-02-03 DIAGNOSIS — I10 Essential (primary) hypertension: Secondary | ICD-10-CM | POA: Diagnosis not present

## 2022-02-03 DIAGNOSIS — Z789 Other specified health status: Secondary | ICD-10-CM | POA: Diagnosis not present

## 2022-02-06 ENCOUNTER — Telehealth: Payer: Self-pay | Admitting: Cardiology

## 2022-02-06 MED ORDER — TORSEMIDE 20 MG PO TABS: 20.0000 mg | ORAL_TABLET | Freq: Two times a day (BID) | ORAL | 1 refills | Status: DC

## 2022-02-06 NOTE — Telephone Encounter (Signed)
Pt c/o swelling: STAT is pt has developed SOB within 24 hours  If swelling, where is the swelling located? Both ankles  How much weight have you gained and in what time span? About 5 lbs in 3 days  Have you gained 3 pounds in a day or 5 pounds in a week? About 5 lbs in 3 days  Do you have a log of your daily weights (if so, list)? Normally is 98 lbs, but is now 103 lbs  Are you currently taking a fluid pill? Yes, has been taking an extra lasix for 3-4 days   Are you currently SOB? no  Have you traveled recently? No  Patient's son says the patient's swelling has not improved since her appointment. He says at first if seemed like taking an extra lasix helped a little but then she started swelling again.

## 2022-02-06 NOTE — Telephone Encounter (Signed)
Spoke to pt's son who stated that pts weight for the last few days are as follows: 8/29- 106 lb 8/30- 101.5 lb 8/31-102 lb 9/1- 103 lb  Pt has swelling in her legs up to her knees, but her ankles are the worst. Verbalized providers recommendations for Torsemide 20 mg BID. Pt's son agreeable and will contact office Tuesday with update.    Pt is not on a potassium supplement.

## 2022-02-10 ENCOUNTER — Telehealth: Payer: Self-pay | Admitting: Cardiology

## 2022-02-10 DIAGNOSIS — Z79899 Other long term (current) drug therapy: Secondary | ICD-10-CM

## 2022-02-10 MED ORDER — TORSEMIDE 20 MG PO TABS: 20.0000 mg | ORAL_TABLET | Freq: Every day | ORAL | 3 refills | Status: DC

## 2022-02-10 NOTE — Telephone Encounter (Signed)
Satira Sark, MD     I am glad she is feeling better.  Let's try to cut Demadex back to 20 mg once daily.  Continue to track weight and swelling in case this needs to be intensified intermittently.  Would recheck BMET in 2 weeks, currently not on a potassium supplement.

## 2022-02-10 NOTE — Telephone Encounter (Signed)
Pt c/o medication issue:  1. Name of Medication: torsemide (DEMADEX) 20 MG tablet  2. How are you currently taking this medication (dosage and times per day)? As prescribed   3. Are you having a reaction (difficulty breathing--STAT)? No   4. What is your medication issue? Patient's son Shelly Fields is calling to report the patient's weight has dropped from 103 lbs to 95.2 lbs since friday. He states she has improved tremendously with only a small amount of "puffiness" still in her feet. Shelly Fields is requesting a callback to discuss further. Please advise.

## 2022-02-10 NOTE — Telephone Encounter (Signed)
Pt son notified and verbalized understanding. Pt's son had no questions or concerns at this time. Medication list updated-labwork ordered.

## 2022-02-10 NOTE — Telephone Encounter (Signed)
Spoke to pt's son who stated that pt is much better. Pt's weight has decreased from 103 lb on Friday to 95.2 lb today. Pt has no swelling in her legs and only a little in her feet. Pt's son would like to know if pt is to continue on current dose of Torsemide.   Please advise.

## 2022-02-16 DIAGNOSIS — Z681 Body mass index (BMI) 19 or less, adult: Secondary | ICD-10-CM | POA: Diagnosis not present

## 2022-02-16 DIAGNOSIS — I4891 Unspecified atrial fibrillation: Secondary | ICD-10-CM | POA: Diagnosis not present

## 2022-02-16 DIAGNOSIS — Z299 Encounter for prophylactic measures, unspecified: Secondary | ICD-10-CM | POA: Diagnosis not present

## 2022-02-16 DIAGNOSIS — E039 Hypothyroidism, unspecified: Secondary | ICD-10-CM | POA: Diagnosis not present

## 2022-02-16 DIAGNOSIS — I10 Essential (primary) hypertension: Secondary | ICD-10-CM | POA: Diagnosis not present

## 2022-02-16 DIAGNOSIS — I5032 Chronic diastolic (congestive) heart failure: Secondary | ICD-10-CM | POA: Diagnosis not present

## 2022-02-17 NOTE — Progress Notes (Signed)
Cardiology Office Note:    Date:  02/23/2022   ID:  Shelly Fields, DOB 1935/02/09, MRN [id number]  PCP:  [facility] [place] [facility] Cardiologist:  Rozann Lesches, MD     Referring MD: Berenice Primas, NP   Chief Complaint:  Follow-up     History of Present Illness:   Shelly Fields is a 87 y.o. female with a hx of HTN, permanent atrial fibrillation on chronic anticoagulation, HFpEF, and severe TR/pulmonary hypertension.      Echo 05/22/2020 revealed LVEF 60 to 65%, significant RV enlargement as previously noted with biatrial enlargement and borderline elevation in pulmonary pressures.  Patient called in recently with weight gain and edema in feet. They increased demadex 20 mg bid and then back down to once daily.  She is doing better now with decreased fluid. Labs 02/18/22 stable.    Past Medical History:  Diagnosis Date   Cor pulmonale (chronic) (HCC)    Diverticulosis    Essential hypertension    Hypothyroidism    Internal hemorrhoids    Persistent atrial fibrillation (HCC)    Pulmonary hypertension (HCC)    Seasonal allergies    Shingles    Current Medications: Current Meds  Medication Sig   ALPRAZolam (XANAX) 0.25 MG tablet Take 0.25 mg by mouth daily as needed for sleep.   Carboxymethylcellulose Sodium (THERATEARS OP) Place 1 drop into both eyes daily.    denosumab (PROLIA) 60 MG/ML SOSY injection Inject 60 mg into the skin every 6 (six) months.   hydrocortisone (ANUSOL-HC) 2.5 % rectal cream Place 1 Application rectally 3 (three) times daily. Apply three times per day x10 days then PRN thereafter   levothyroxine (SYNTHROID) 88 MCG tablet Take 88 mcg by mouth daily.   metoprolol succinate (TOPROL-XL) 50 MG 24 hr tablet TAKE 1 AND 1/2 TABLETS BY  MOUTH TWICE DAILY   OVER THE COUNTER MEDICATION Vit D3   psyllium (METAMUCIL) 58.6 % packet Take 1 packet by mouth daily.   Rivaroxaban (XARELTO) 15 MG TABS tablet Take 1 tablet (15 mg total) by  mouth daily with supper.   torsemide (DEMADEX) 20 MG tablet Take 1 tablet (20 mg total) by mouth daily.    Allergies:   Patient has no known allergies.   Social History   Tobacco Use   Smoking status: Never    Passive exposure: Past   Smokeless tobacco: Never  Vaping Use   Vaping Use: Never used  Substance Use Topics   Alcohol use: No    Alcohol/week: 0.0 standard drinks of alcohol   Drug use: No    Family Hx: The patient's family history includes Heart disease in her mother; Hypertension in her father. There is no history of Colon cancer.  ROS   EKGs/Labs/Other Test Reviewed:    EKG:  EKG is  not ordered today.   Recent Labs: 01/20/2022: ALT 18; B Natriuretic Peptide 510.0; Hemoglobin 13.3; Platelets 263 02/18/2022: BUN 19; Creatinine, Ser 0.85; Potassium 4.3; Sodium 130   Recent Lipid Panel No results for input(s): "CHOL", "TRIG", "HDL", "VLDL", "LDLCALC", "LDLDIRECT" in the last 8760 hours.   Prior CV Studies: ECHO COMPLETE WO IMAGING ENHANCING AGENT 05/22/2020  Narrative ECHOCARDIOGRAM REPORT    Patient Name:   [place] Date of Exam: 05/22/2020 Medical Rec #:  [id number]      Height:       62.0 in Accession #:    [id number]     Weight:  100.4 lb Date of Birth:  02-14-1935     BSA:          1.426 m Patient Age:    5 years       BP:           116/74 mmHg Patient Gender: F              HR:           75 bpm. Exam Location:  [facility]  Procedure: 2D Echo, Cardiac Doppler and Color Doppler  Indications:     I27.20 Pulmonary hypertension  History:         Patient has prior history of Echocardiogram examinations, most recent 08/17/2018. Pulmonary HTN, Arrythmias:Atrial Fibrillation; Risk Factors:Hypertension and Non-Smoker.  Sonographer:     Jeneen Montgomery RDMS, RVT, RDCS Referring Phys:  [place] Diagnosing Phys: Carlyle Dolly MD  IMPRESSIONS   1. Left ventricular ejection fraction, by estimation, is 60 to 65%. The left ventricle has  normal function. The left ventricle has no regional wall motion abnormalities. Left ventricular diastolic parameters are indeterminate. 2. Right ventricular systolic function is low normal. The right ventricular size is severely enlarged. 3. Left atrial size was severely dilated. 4. Right atrial size was severely dilated. 5. The pericardial effusion is circumferential. 6. The mitral valve is normal in structure. No evidence of mitral valve regurgitation. No evidence of mitral stenosis. 7. The tricuspid valve is abnormal. Tricuspid valve regurgitation is severe. 8. The aortic valve is tricuspid. Aortic valve regurgitation is mild. No aortic stenosis is present. 9. Borderline pulmonary HTN, PASP is 30 mmHg. 10. The inferior vena cava is dilated in size with <50% respiratory variability, suggesting right atrial pressure of 15 mmHg.  Comparison(s): Echocardiogram done at [facility] on 08/17/18 showed an EF of 60-65%.  FINDINGS Left Ventricle: Left ventricular ejection fraction, by estimation, is 60 to 65%. The left ventricle has normal function. The left ventricle has no regional wall motion abnormalities. The left ventricular internal cavity size was normal in size. There is no left ventricular hypertrophy. Left ventricular diastolic parameters are indeterminate.  Right Ventricle: The right ventricular size is severely enlarged. No increase in right ventricular wall thickness. Right ventricular systolic function is low normal.  Left Atrium: Left atrial size was severely dilated.  Right Atrium: Right atrial size was severely dilated.  Pericardium: Trivial pericardial effusion is present. The pericardial effusion is circumferential.  Mitral Valve: The mitral valve is normal in structure. No evidence of mitral valve regurgitation. No evidence of mitral valve stenosis.  Tricuspid Valve: The tricuspid valve is abnormal. Tricuspid valve regurgitation is severe. No evidence of tricuspid stenosis.  Aortic  Valve: The aortic valve is tricuspid. Aortic valve regurgitation is mild. Aortic regurgitation PHT measures 1099 msec. No aortic stenosis is present. Aortic valve mean gradient measures 1.6 mmHg. Aortic valve peak gradient measures 3.0 mmHg. Aortic valve area, by VTI measures 1.67 cm.  Pulmonic Valve: The pulmonic valve was not well visualized. Pulmonic valve regurgitation is trivial. No evidence of pulmonic stenosis.  Aorta: The aortic root is normal in size and structure.  Pulmonary Artery: Borderline pulmonary HTN, PASP is 30 mmHg.  Venous: The inferior vena cava is dilated in size with less than 50% respiratory variability, suggesting right atrial pressure of 15 mmHg.  IAS/Shunts: No atrial level shunt detected by color flow Doppler.   LEFT VENTRICLE PLAX 2D LVIDd:         3.79 cm     Diastology LVIDs:  2.70 cm     LV e' medial:    8.86 cm/s LV PW:         0.81 cm     LV E/e' medial:  9.7 LV IVS:        0.90 cm     LV e' lateral:   10.77 cm/s LVOT diam:     1.90 cm     LV E/e' lateral: 8.0 LV SV:         32 LV SV Index:   22 LVOT Area:     2.84 cm  LV Volumes (MOD) LV vol d, MOD A2C: 33.9 ml LV vol d, MOD A4C: 32.2 ml LV vol s, MOD A2C: 9.3 ml LV vol s, MOD A4C: 10.8 ml LV SV MOD A2C:     24.6 ml LV SV MOD A4C:     32.2 ml LV SV MOD BP:      23.5 ml  RIGHT VENTRICLE RV S prime:     9.49 cm/s TAPSE (M-mode): 1.7 cm  LEFT ATRIUM             Index       RIGHT ATRIUM           Index LA diam:        3.00 cm 2.10 cm/m  RA Area:     28.30 cm LA Vol (A2C):   59.7 ml 41.86 ml/m RA Volume:   105.00 ml 73.62 ml/m LA Vol (A4C):   55.9 ml 39.19 ml/m LA Biplane Vol: 57.4 ml 40.24 ml/m AORTIC VALVE AV Area (Vmax):    1.68 cm AV Area (Vmean):   1.64 cm AV Area (VTI):     1.67 cm AV Vmax:           86.87 cm/s AV Vmean:          60.423 cm/s AV VTI:            0.190 m AV Peak Grad:      3.0 mmHg AV Mean Grad:      1.6 mmHg LVOT Vmax:         51.37 cm/s LVOT  Vmean:        34.967 cm/s LVOT VTI:          0.112 m LVOT/AV VTI ratio: 0.59 AI PHT:            1099 msec  AORTA Ao Root diam: 3.30 cm  MITRAL VALVE               TRICUSPID VALVE MV Area (PHT)              TR Peak grad:   14.0 mmHg MV Decel Time: 160 msec    TR Vmax:        187.00 cm/s MR Peak grad: 45.5 mmHg MR Vmax:      337.40 cm/s  SHUNTS MV E velocity: 86.15 cm/s  Systemic VTI:  0.11 m MV A velocity: 19.30 cm/s  Systemic Diam: 1.90 cm MV E/A ratio:  4.46  Carlyle Dolly MD Electronically signed by Carlyle Dolly MD Signature Date/Time: 05/22/2020/3:06:57 PM    Final         Risk Assessment/Calculations/Metrics:    CHA2DS2-VASc Score = 5   This indicates a 7.2% annual risk of stroke. The patient's score is based upon: CHF History: 1 HTN History: 1 Diabetes History: 0 Stroke History: 0 Vascular Disease History: 0 Age Score: 2 Gender Score: 1  Physical Exam:    VS:  BP 122/78   Pulse 76   Ht '5\' 2"'$  (1.575 m)   Wt 93 lb (42.2 kg)   SpO2 98%   BMI 17.01 kg/m     Wt Readings from Last 3 Encounters:  02/23/22 93 lb (42.2 kg)  01/27/22 102 lb 11.2 oz (46.6 kg)  01/23/22 100 lb 12.8 oz (45.7 kg)    Physical Exam  GEN: Thin, in no acute distress  Neck: no JVD, carotid bruits, or masses Cardiac: irreg irreg; no murmurs, rubs, or gallops  Respiratory:  clear to auscultation bilaterally, normal work of breathing GI: soft, nontender, nondistended, + BS Ext: varicose veins, without cyanosis, clubbing, or edema, Good distal pulses bilaterally Neuro:  Alert and Oriented x 3, Strength and sensation are intact Psych: euthymic mood, full affect       ASSESSMENT & PLAN:   No problem-specific Assessment & Plan notes found for this encounter.   Chronic diastolic CHF  LVEF 03-21% G1DD -doing better on torsemide and compression hose. 2 gm sodium diet. Will update echo with recent problems. Bmet last week stable  HTN well controlled.  Permanent  AFib on Xarelto and metoprolol   Severre TR/pulmonary HTN-update echo          Dispo:  No follow-ups on file.   Medication Adjustments/Labs and Tests Ordered: Current medicines are reviewed at length with the patient today.  Concerns regarding medicines are outlined above.  Tests Ordered: Orders Placed This Encounter  Procedures   ECHOCARDIOGRAM COMPLETE   Medication Changes: No orders of the defined types were placed in this encounter.  Signed, Ermalinda Barrios, PA-C  02/23/2022 1:19 PM    [facility] [address], [place], [place]  [address] Phone: [phone]; Fax: [phone]

## 2022-02-18 ENCOUNTER — Other Ambulatory Visit (HOSPITAL_COMMUNITY)
Admission: RE | Admit: 2022-02-18 | Discharge: 2022-02-18 | Disposition: A | Discharge status: Home / Self Care | Payer: Medicare Other | Source: Ambulatory Visit | Attending: Cardiology | Admitting: Cardiology

## 2022-02-18 DIAGNOSIS — Z79899 Other long term (current) drug therapy: Secondary | ICD-10-CM | POA: Diagnosis not present

## 2022-02-18 LAB — BASIC METABOLIC PANEL
Anion gap: 8 (ref 5–15)
BUN: 19 mg/dL (ref 8–23)
CO2: 32 mmol/L (ref 22–32)
Calcium: 9.6 mg/dL (ref 8.9–10.3)
Chloride: 90 mmol/L — ABNORMAL LOW (ref 98–111)
Creatinine, Ser: 0.85 mg/dL (ref 0.44–1.00)
GFR, Estimated: >60 mL/min (ref >60)
Glucose, Bld: 105 mg/dL — ABNORMAL HIGH (ref 70–99)
Potassium: 4.3 mmol/L (ref 3.5–5.1)
Sodium: 130 mmol/L — ABNORMAL LOW (ref 135–145)

## 2022-02-23 ENCOUNTER — Ambulatory Visit: Payer: Medicare Other | Attending: Physician Assistant | Admitting: Physician Assistant

## 2022-02-23 ENCOUNTER — Encounter: Payer: Self-pay | Admitting: Physician Assistant

## 2022-02-23 VITALS — BP 122/78 | HR 76 | Ht 62.0 in | Wt 93.0 lb

## 2022-02-23 DIAGNOSIS — I5032 Chronic diastolic (congestive) heart failure: Secondary | ICD-10-CM | POA: Diagnosis not present

## 2022-02-23 DIAGNOSIS — I272 Pulmonary hypertension, unspecified: Secondary | ICD-10-CM | POA: Diagnosis not present

## 2022-02-23 DIAGNOSIS — I4819 Other persistent atrial fibrillation: Secondary | ICD-10-CM | POA: Diagnosis not present

## 2022-02-23 DIAGNOSIS — I10 Essential (primary) hypertension: Secondary | ICD-10-CM

## 2022-02-23 NOTE — Patient Instructions (Signed)
Medication Instructions:  Your physician recommends that you continue on your current medications as directed. Please refer to the Current Medication list given to you today.   Labwork: None today  Testing/Procedures:  ECHO in the [place] [facility]  Follow-Up: 5 months Dr.McDowell in the [place] office  Any Other Special Instructions Will Be Listed Below (If Applicable).  If you need a refill on your cardiac medications before your next appointment, please call your pharmacy.   Two Gram Sodium Diet 2000 mg  What is Sodium? Sodium is a mineral found naturally in many foods. The most significant source of sodium in the diet is table salt, which is about 40% sodium.  Processed, convenience, and preserved foods also contain a large amount of sodium.  The body needs only 500 mg of sodium daily to function,  A normal diet provides more than enough sodium even if you do not use salt.  Why Limit Sodium? A build up of sodium in the body can cause thirst, increased blood pressure, shortness of breath, and water retention.  Decreasing sodium in the diet can reduce edema and risk of heart attack or stroke associated with high blood pressure.  Keep in mind that there are many other factors involved in these health problems.  Heredity, obesity, lack of exercise, cigarette smoking, stress and what you eat all play a role.  General Guidelines: Do not add salt at the table or in cooking.  One teaspoon of salt contains over 2 grams of sodium. Read food labels Avoid processed and convenience foods Ask your dietitian before eating any foods not dicussed in the menu planning guidelines Consult your physician if you wish to use a salt substitute or a sodium containing medication such as antacids.  Limit milk and milk products to 16 oz (2 cups) per day.  Shopping Hints: READ LABELS!! "Dietetic" does not necessarily mean low sodium. Salt and other sodium ingredients are often added to foods during  processing.    Menu Planning Guidelines Food Group Choose More Often Avoid  Beverages (see also the milk group All fruit juices, low-sodium, salt-free vegetables juices, low-sodium carbonated beverages Regular vegetable or tomato juices, commercially softened water used for drinking or cooking  Breads and Cereals Enriched white, wheat, rye and pumpernickel bread, hard rolls and dinner rolls; muffins, cornbread and waffles; most dry cereals, cooked cereal without added salt; unsalted crackers and breadsticks; low sodium or homemade bread crumbs Bread, rolls and crackers with salted tops; quick breads; instant hot cereals; pancakes; commercial bread stuffing; self-rising flower and biscuit mixes; regular bread crumbs or cracker crumbs  Desserts and Sweets Desserts and sweets mad with mild should be within allowance Instant pudding mixes and cake mixes  Fats Butter or margarine; vegetable oils; unsalted salad dressings, regular salad dressings limited to 1 Tbs; light, sour and heavy cream Regular salad dressings containing bacon fat, bacon bits, and salt pork; snack dips made with instant soup mixes or processed cheese; salted nuts  Fruits Most fresh, frozen and canned fruits Fruits processed with salt or sodium-containing ingredient (some dried fruits are processed with sodium sulfites        Vegetables Fresh, frozen vegetables and low- sodium canned vegetables Regular canned vegetables, sauerkraut, pickled vegetables, and others prepared in brine; frozen vegetables in sauces; vegetables seasoned with ham, bacon or salt pork  Condiments, Sauces, Miscellaneous  Salt substitute with physician's approval; pepper, herbs, spices; vinegar, lemon or lime juice; hot pepper sauce; garlic powder, onion powder, low sodium soy sauce (1  Tbs.); low sodium condiments (ketchup, chili sauce, mustard) in limited amounts (1 tsp.) fresh ground horseradish; unsalted tortilla chips, pretzels, potato chips, popcorn, salsa  (1/4 cup) Any seasoning made with salt including garlic salt, celery salt, onion salt, and seasoned salt; sea salt, rock salt, kosher salt; meat tenderizers; monosodium glutamate; mustard, regular soy sauce, barbecue, sauce, chili sauce, teriyaki sauce, steak sauce, Worcestershire sauce, and most flavored vinegars; canned gravy and mixes; regular condiments; salted snack foods, olives, picles, relish, horseradish sauce, catsup   Food preparation: Try these seasonings Meats:    Pork Sage, onion Serve with applesauce  Chicken Poultry seasoning, thyme, parsley Serve with cranberry sauce  Lamb Curry powder, rosemary, garlic, thyme Serve with mint sauce or jelly  Veal Marjoram, basil Serve with current jelly, cranberry sauce  Beef Pepper, bay leaf Serve with dry mustard, unsalted chive butter  Fish [place] leaf, dill Serve with unsalted lemon butter, unsalted parsley butter  Vegetables:    Asparagus Lemon juice   Broccoli Lemon juice   Carrots Mustard dressing parsley, mint, nutmeg, glazed with unsalted butter and sugar   Green beans Marjoram, lemon juice, nutmeg,dill seed   Tomatoes Basil, marjoram, onion   Spice /blend for [employer]" 4 tsp ground thyme 1 tsp ground sage 3 tsp ground rosemary 4 tsp ground marjoram   Test your knowledge A product that says "Salt Free" may still contain sodium. True or False Garlic Powder and Hot Pepper Sauce an be used as alternative seasonings.True or False Processed foods have more sodium than fresh foods.  True or False Canned Vegetables have less sodium than froze True or False   WAYS TO DECREASE YOUR SODIUM INTAKE Avoid the use of added salt in cooking and at the table.  Table salt (and other prepared seasonings which contain salt) is probably one of the greatest sources of sodium in the diet.  Unsalted foods can gain flavor from the sweet, sour, and butter taste sensations of herbs and spices.  Instead of using salt for seasoning, try the following  seasonings with the foods listed.  Remember: how you use them to enhance natural food flavors is limited only by your creativity... Allspice-Meat, fish, eggs, fruit, peas, red and yellow vegetables Almond Extract-Fruit baked goods Anise Seed-Sweet breads, fruit, carrots, beets, cottage cheese, cookies (tastes like licorice) Basil-Meat, fish, eggs, vegetables, rice, vegetables salads, soups, sauces Bay Leaf-Meat, fish, stews, poultry Burnet-Salad, vegetables (cucumber-like flavor) Caraway Seed-Bread, cookies, cottage cheese, meat, vegetables, cheese, rice Cardamon-Baked goods, fruit, soups Celery Powder or seed-Salads, salad dressings, sauces, meatloaf, soup, bread.Do not use  celery salt Chervil-Meats, salads, fish, eggs, vegetables, cottage cheese (parsley-like flavor) Chili Power-Meatloaf, chicken cheese, corn, eggplant, egg dishes Chives-Salads cottage cheese, egg dishes, soups, vegetables, sauces Cilantro-Salsa, casseroles Cinnamon-Baked goods, fruit, pork, lamb, chicken, carrots Cloves-Fruit, baked goods, fish, pot roast, green beans, beets, carrots Coriander-Pastry, cookies, meat, salads, cheese (lemon-orange flavor) Cumin-Meatloaf, fish,cheese, eggs, cabbage,fruit pie (caraway flavor) [employer], fruit, eggs, fish, poultry, cottage cheese, vegetables Dill Seed-Meat, cottage cheese, poultry, vegetables, fish, salads, bread Fennel Seed-Bread, cookies, apples, pork, eggs, fish, beets, cabbage, cheese, Licorice-like flavor Garlic-(buds or powder) Salads, meat, poultry, fish, bread, butter, vegetables, potatoes.Do not  use garlic salt Ginger-Fruit, vegetables, baked goods, meat, fish, poultry Horseradish Root-Meet, vegetables, butter Lemon Juice or Extract-Vegetables, fruit, tea, baked goods, fish salads Mace-Baked goods fruit, vegetables, fish, poultry (taste like nutmeg) Maple Extract-Syrups Marjoram-Meat, chicken, fish, vegetables, breads, green salads (taste like  Sage) Mint-Tea, lamb, sherbet, vegetables, desserts, carrots, cabbage Mustard, Dry or  Seed-Cheese, eggs, meats, vegetables, poultry Nutmeg-Baked goods, fruit, chicken, eggs, vegetables, desserts Onion Powder-Meat, fish, poultry, vegetables, cheese, eggs, bread, rice salads (Do not use   Onion salt) Orange Extract-Desserts, baked goods Oregano-Pasta, eggs, cheese, onions, pork, lamb, fish, chicken, vegetables, green salads Paprika-Meat, fish, poultry, eggs, cheese, vegetables Parsley Flakes-Butter, vegetables, meat fish, poultry, eggs, bread, salads (certain forms may   Contain sodium Pepper-Meat fish, poultry, vegetables, eggs Peppermint Extract-Desserts, baked goods Poppy Seed-Eggs, bread, cheese, fruit dressings, baked goods, noodles, vegetables, cottage  [employer], poultry, meat, fish, cauliflower, turnips,eggs bread Saffron-Rice, bread, veal, chicken, fish, eggs Sage-Meat, fish, poultry, onions, eggplant, tomateos, pork, stews Savory-Eggs, salads, poultry, meat, rice, vegetables, soups, pork Tarragon-Meat, poultry, fish, eggs, butter, vegetables (licorice-like flavor)  Thyme-Meat, poultry, fish, eggs, vegetables, (clover-like flavor), sauces, soups Tumeric-Salads, butter, eggs, fish, rice, vegetables (saffron-like flavor) Vanilla Extract-Baked goods, candy Vinegar-Salads, vegetables, meat marinades Walnut Extract-baked goods, candy   2. Choose your Foods Wisely   The following is a list of foods to avoid which are high in sodium:  Meats-Avoid all smoked, canned, salt cured, dried and kosher meat and fish as well as Anchovies   [employer] meats:Bologna, Liverwurst, Pastrami Canned meat or fish  Marinated herring Caviar    Pepperoni Corned Beef   Pizza Dried chipped beef  Salami Frozen breaded fish or meat Salt pork Frankfurters or hot dogs  Sardines Gefilte fish   Sausage Ham (boiled ham, Proscuitto Smoked butt     spiced ham)   Spam      TV Dinners Vegetables Canned vegetables (Regular) Relish Canned mushrooms  Sauerkraut Olives    Tomato juice Pickles  Bakery and Dessert Products Canned puddings  Cream pies Cheesecake   Decorated cakes Cookies  Beverages/Juices Tomato juice, regular  Gatorade   V-8 vegetable juice, regular  Breads and Cereals Biscuit mixes   Salted potato chips, corn chips, pretzels Bread stuffing mixes  Salted crackers and rolls Pancake and waffle mixes Self-rising flour  Seasonings Accent    Meat sauces Barbecue sauce  Meat tenderizer Catsup    Monosodium glutamate (MSG) Celery salt   Onion salt Chili sauce   Prepared mustard Garlic salt   Salt, seasoned salt, sea salt Gravy mixes   Soy sauce Horseradish   Steak sauce Ketchup   Tartar sauce Lite salt    Teriyaki sauce Marinade mixes   Worcestershire sauce  Others Baking powder   Cocoa and cocoa mixes Baking soda   Commercial casserole mixes Candy-caramels, chocolate  Dehydrated soups    Bars, fudge,nougats  Instant rice and pasta mixes Canned broth or soup  Maraschino cherries Cheese, aged and processed cheese and cheese spreads  Learning Assessment Quiz  Indicated T (for True) or F (for False) for each of the following statements:  _____ Fresh fruits and vegetables and unprocessed grains are generally low in sodium _____ Water may contain a considerable amount of sodium, depending on the source _____ You can always tell if a food is high in sodium by tasting it _____ Certain laxatives my be high in sodium and should be avoided unless prescribed   by a physician or pharmacist _____ Salt substitutes may be used freely by anyone on a sodium restricted diet _____ Sodium is present in table salt, food additives and as a natural component of   most foods _____ Table salt is approximately 90% sodium _____ Limiting sodium intake may help prevent excess fluid accumulation in the body _____ On a  sodium-restricted diet, seasonings such as bouillon soy sauce, and    cooking wine should be used in place of table salt _____ On an ingredient list, a product which lists monosodium glutamate as the first   ingredient is an appropriate food to include on a low sodium diet  Circle the best answer(s) to the following statements (Hint: there may be more than one correct answer)  11. On a low-sodium diet, some acceptable snack items are:    A. Olives  F. Bean dip   K. Grapefruit juice    B. Salted Pretzels G. Commercial Popcorn   L. Canned peaches    C. Carrot Sticks  H. Bouillon   M. Unsalted nuts   D. [place] fries  I. Peanut butter crackers N. Salami   E. Sweet pickles J. Tomato Juice   O. Pizza  12.  Seasonings that may be used freely on a reduced - sodium diet include   A. Lemon wedges F.Monosodium glutamate K. Celery seed    B.Soysauce   G. Pepper   L. Mustard powder   C. Sea salt  H. Cooking wine  M. Onion flakes   D. Vinegar  E. Prepared horseradish N. Salsa   E. Sage   J. Worcestershire sauce  O. Chutney

## 2022-02-25 ENCOUNTER — Telehealth: Payer: Self-pay

## 2022-02-25 ENCOUNTER — Ambulatory Visit: Payer: Medicare Other | Admitting: Physician Assistant

## 2022-02-25 ENCOUNTER — Ambulatory Visit: Payer: Medicare Other | Attending: Physician Assistant

## 2022-02-25 ENCOUNTER — Telehealth: Payer: Self-pay | Admitting: Cardiology

## 2022-02-25 DIAGNOSIS — I5032 Chronic diastolic (congestive) heart failure: Secondary | ICD-10-CM | POA: Diagnosis not present

## 2022-02-25 LAB — ECHOCARDIOGRAM COMPLETE
AR max vel: 1.95 cm2
AV Peak grad: 2.8 mmHg
AV Vena cont: 0.36 cm
Ao pk vel: 0.83 m/s
Area-P 1/2: 5.68 cm2
Calc EF: 61.3 %
MV M vel: 3.63 m/s
MV Peak grad: 52.8 mmHg
P 1/2 time: 628 msec
S' Lateral: 2.27 cm
Single Plane A2C EF: 64.8 %
Single Plane A4C EF: 55.3 %

## 2022-02-25 NOTE — Telephone Encounter (Signed)
-----   Message from Imogene Burn, [place] sent at 02/25/2022  2:21 PM EDT ----- Echo normal heart funciton severe TR and enlarged right ventrical. Evidence of fluid overload. Increase demadex 40 mg daily for 5 days then back to  20 mg once daily. Ask her how her edema and breathing are doing.

## 2022-02-25 NOTE — Telephone Encounter (Signed)
Patient wants to make sure Dr. Domenic Polite looks at her echo she had done today.  She said she was called today to increase her demadex to two tablets a day for 5 days, but she is scared to increase without getting Dr. Myles Gip approval first.

## 2022-02-25 NOTE — Telephone Encounter (Signed)
Patient notified and verbalized understanding. Patient had no questions or concerns at this time. Pt stated that she is not having any problems with Edema- weight has been consistently 91 lb and no trouble breathing.  PCP copied

## 2022-02-26 NOTE — Telephone Encounter (Signed)
Left a message for patient to call office back regarding medications.

## 2022-02-26 NOTE — Telephone Encounter (Signed)
Patient notified and verbalized understanding. Patient had no further questions or concerns at this time.  

## 2022-02-26 NOTE — Telephone Encounter (Signed)
Satira Sark, MD:    I actually read her echo which was done today.  I agree with the recent recommendations by Ms. Bonnell Public PA-C.

## 2022-03-03 DIAGNOSIS — Z79899 Other long term (current) drug therapy: Secondary | ICD-10-CM | POA: Diagnosis not present

## 2022-03-03 DIAGNOSIS — M818 Other osteoporosis without current pathological fracture: Secondary | ICD-10-CM | POA: Diagnosis not present

## 2022-03-03 DIAGNOSIS — M859 Disorder of bone density and structure, unspecified: Secondary | ICD-10-CM | POA: Diagnosis not present

## 2022-03-04 ENCOUNTER — Encounter: Payer: Self-pay | Admitting: *Deleted

## 2022-03-04 ENCOUNTER — Telehealth: Payer: Self-pay | Admitting: *Deleted

## 2022-03-04 NOTE — Telephone Encounter (Signed)
Patient gave verbal consent for office to send a letter to [facility] on her behalf stating that she can not afford to pay $85/mth for xarelto prescription.

## 2022-03-17 ENCOUNTER — Other Ambulatory Visit: Payer: Self-pay | Admitting: *Deleted

## 2022-03-17 DIAGNOSIS — I4819 Other persistent atrial fibrillation: Secondary | ICD-10-CM

## 2022-03-17 MED ORDER — RIVAROXABAN 15 MG PO TABS: 15.0000 mg | ORAL_TABLET | Freq: Every day | ORAL | 0 refills | Status: DC

## 2022-03-25 DIAGNOSIS — M810 Age-related osteoporosis without current pathological fracture: Secondary | ICD-10-CM | POA: Diagnosis not present

## 2022-04-09 DIAGNOSIS — H04123 Dry eye syndrome of bilateral lacrimal glands: Secondary | ICD-10-CM | POA: Diagnosis not present

## 2022-05-05 ENCOUNTER — Encounter (INDEPENDENT_AMBULATORY_CARE_PROVIDER_SITE_OTHER): Payer: Self-pay | Admitting: Gastroenterology

## 2022-05-05 ENCOUNTER — Ambulatory Visit (INDEPENDENT_AMBULATORY_CARE_PROVIDER_SITE_OTHER): Payer: Medicare Other | Admitting: Gastroenterology

## 2022-05-05 VITALS — BP 160/97 | HR 87 | Ht 62.0 in | Wt 89.7 lb

## 2022-05-05 DIAGNOSIS — K643 Fourth degree hemorrhoids: Secondary | ICD-10-CM

## 2022-05-05 DIAGNOSIS — K625 Hemorrhage of anus and rectum: Secondary | ICD-10-CM

## 2022-05-05 NOTE — Patient Instructions (Signed)
It was nice to see you! In place of the metamucil, you can try doing benefiber, 1T 1-2x/day with a meal. Make sure you are drinking a decent amount of water with this. You can continue to use the anusol cream for your hemorrhoids, avoid straining and try to limit toilet time to no more than 5 minutes. As we discussed, I think holding off on colonoscopy at this time is reasonable given your other medical issues, however, without this, I cannot be 100% sure that bleeding is from your hemorrhoids. Please let me know if you have any new or worsening GI symptoms  Follow up 6 months

## 2022-05-05 NOTE — Progress Notes (Addendum)
Referring Provider: Berenice Primas, NP Primary Care Physician:  Berenice Primas Primary GI Physician: Jenetta Downer   Chief Complaint  Patient presents with   Rectal Bleeding    Follow up on rectal bleeding. Still seeing some blood at times. Taking metamucil and it helps with stools.    HPI:   Shelly Fields is a 87 y.o. female with past medical history of  cor pulmonale, HTN, hypothyroidism, A fib, pulmonary HTN.    Patient presenting today for follow up of rectal bleeding.  At last visit in August, pt reported long history of hemorrhoids since she was a teenager. She notes she was seeing fresh blood on the toilet tissue. At baseline she has had to take a stool softener and eat a lot of veggies to keep bowels moving regularly. She states that diarrhea resolved, Patient states she started on metamucil as directed by her PCP and stools seem closer back to normal though they are still somewhat skinny in diameter. Hemorrhoids present on rectal exam.   Recommend colonoscopy, continue metamucil, Rx Anusol TID, avoid straining, limit toilet time  Present:  She states that she is doing well on metamucil, which is keeping her stools more regular with 1 BM per day. She is still seeing some blood on the toilet tissue when wiping though very infrequently. She notes that she had some Improvement in hemorrhoids with the anusol, though was just using it more PRN. Denies nausea or vomiting. No abdominal pain. Appetite is good, she is eating a fairly well balanced diet. Does note about 10 pounds weight loss since her diuretic was switched. Patient denies melena, nausea, vomiting, diarrhea, constipation, dysphagia, odyonophagia, early satiety.   Hgb 13.3 in August with remaining CBC WNL, BMP in September with slightly low sodium of 130, otherwise okay.  TSH 1.46 in September   Last Colonoscopy:11/18/17 - Perianal skin tags found on perianal exam. - One small polyp in the proximal transverse colon.  Biopsied-tubular adenoma  - Diverticulosis in the sigmoid colon. - Melanosis in the colon. - External and internal hemorrhoids. Last Endoscopy: never    Past Medical History:  Diagnosis Date   Cor pulmonale (chronic) (HCC)    Diverticulosis    Essential hypertension    Hypothyroidism    Internal hemorrhoids    Persistent atrial fibrillation ([place])    Pulmonary hypertension (HCC)    Seasonal allergies    Shingles     Past Surgical History:  Procedure Laterality Date   ABDOMINAL HYSTERECTOMY     CATARACT EXTRACTION     COLONOSCOPY N/A 01/12/2013   Procedure: COLONOSCOPY;  Surgeon: Rogene Houston, MD;  Location: [facility];  Service: Endoscopy;  Laterality: N/A;  1030   COLONOSCOPY N/A 11/18/2017   Procedure: COLONOSCOPY;  Surgeon: Rogene Houston, MD;  Location: [facility];  Service: Endoscopy;  Laterality: N/A;   POLYPECTOMY  11/18/2017   Procedure: POLYPECTOMY;  Surgeon: Rogene Houston, MD;  Location: [facility];  Service: Endoscopy;;  colon    Current Outpatient Medications  Medication Sig Dispense Refill   ALPRAZolam (XANAX) 0.25 MG tablet Take 0.25 mg by mouth daily as needed for sleep.     Carboxymethylcellulose Sodium (THERATEARS OP) Place 1 drop into both eyes daily.      denosumab (PROLIA) 60 MG/ML SOSY injection Inject 60 mg into the skin every 6 (six) months.     hydrocortisone (ANUSOL-HC) 2.5 % rectal cream Place 1 Application rectally 3 (three) times daily. Apply three  times per day x10 days then PRN thereafter 60 g 1   levothyroxine (SYNTHROID) 88 MCG tablet Take 88 mcg by mouth daily.     metoprolol succinate (TOPROL-XL) 50 MG 24 hr tablet TAKE 1 AND 1/2 TABLETS BY  MOUTH TWICE DAILY 270 tablet 3   psyllium (METAMUCIL) 58.6 % packet Take 1 packet by mouth daily.     Rivaroxaban (XARELTO) 15 MG TABS tablet Take 1 tablet (15 mg total) by mouth daily with supper. 35 tablet 0   torsemide (DEMADEX) 20 MG tablet Take 1 tablet (20 mg total) by mouth daily. 90  tablet 3   No current facility-administered medications for this visit.    Allergies as of 05/05/2022   (No Known Allergies)    Family History  Problem Relation Age of Onset   Heart disease Mother    Hypertension Father    Colon cancer Neg Hx     Social History   Socioeconomic History   Marital status: Widowed    Spouse name: Not on file   Number of children: Not on file   Years of education: Not on file   Highest education level: Not on file  Occupational History   Not on file  Tobacco Use   Smoking status: Never    Passive exposure: Past   Smokeless tobacco: Never  Vaping Use   Vaping Use: Never used  Substance and Sexual Activity   Alcohol use: No    Alcohol/week: 0.0 standard drinks of alcohol   Drug use: No   Sexual activity: Not on file  Other Topics Concern   Not on file  Social History Narrative   Not on file   Social Determinants of Health   Financial Resource Strain: Not on file  Food Insecurity: Not on file  Transportation Needs: Not on file  Physical Activity: Not on file  Stress: Not on file  Social Connections: Not on file   Review of systems General: negative for malaise, night sweats, fever, chills, +weight loss Neck: Negative for lumps, goiter, pain and significant neck swelling Resp: Negative for cough, wheezing, dyspnea at rest CV: Negative for chest pain, leg swelling, palpitations, orthopnea GI: denies melena,  nausea, vomiting, diarrhea, constipation, dysphagia, odyonophagia, early satiety. +toilet tissue hematochezia +hemorrhoids +weight loss MSK: Negative for joint pain or swelling, back pain, and muscle pain. Derm: Negative for itching or rash Psych: Denies depression, anxiety, memory loss, confusion. No homicidal or suicidal ideation.  Heme: Negative for prolonged bleeding, bruising easily, and swollen nodes. Endocrine: Negative for cold or heat intolerance, polyuria, polydipsia and goiter. Neuro: negative for tremor, gait  imbalance, syncope and seizures. The remainder of the review of systems is noncontributory.  Physical Exam: BP (!) 160/97 (BP Location: Left Arm, Patient Position: Sitting, Cuff Size: Normal)   Pulse 87   Ht '5\' 2"'$  (1.575 m)   Wt 89 lb 11.2 oz (40.7 kg)   BMI 16.41 kg/m  General:   Alert and oriented. No distress noted. Pleasant and cooperative.  Head:  Normocephalic and atraumatic. Eyes:  Conjuctiva clear without scleral icterus. Mouth:  Oral mucosa pink and moist. Good dentition. No lesions. Heart: Normal rate and rhythm, s1 and s2 heart sounds present.  Lungs: Clear lung sounds in all lobes. Respirations equal and unlabored. Abdomen:  +BS, soft, non-tender and non-distended. No rebound or guarding. No HSM or masses noted. Derm: No palmar erythema or jaundice Msk:  Symmetrical without gross deformities. Normal posture. Extremities:  Without edema. Neurologic:  Alert and  oriented x4 Psych:  Alert and cooperative. Normal mood and affect.  Invalid input(s): "6 MONTHS"   ASSESSMENT: AMAREA MACDOWELL is a 87 y.o. female presenting today for follow up of rectal bleeding/hemorrhoids.  Toilet tissue hematochezia continues but is very infrequent. She uses anusol PRN with good relief of her hemorrhoids. Denies abdominal pain, nausea, vomiting, melena. Appetite is good. She is taking metamucil but notes the taste of it makes it hard to drink. Discussed replacing this with Benefiber 1T 1-2x/day with meals, she should make sure she is drinking plenty of water without exceeding any fluid restrictions provided by her cardiologist. Notably has had approximately 10 pounds of weight loss since starting a new diuretic, suspect this is secondary to diuresis. Patient does not wish to update colonoscopy at this time, as previously discussed for rectal bleeding. I feel this is reasonable given her age and other co-morbidities, however, I did make sure patient was aware that while I suspect rectal bleeding is  from hemorrhoids, without endoscopic evaluation, there could potentially be other underlying causes, to include malignancy. Patient verbalized understanding of this and is still comfortable with holding off on endoscopic evaluation. She will make me aware of new or worsening GI symptoms.   PLAN:  Continue anusol for hemorrhoids  2. Start benefiber 1T 1-2x/day, good water intake  3. Avoid straining, limit toilet time 4. Pt to make me aware of new or worsening GI symptoms  All questions were answered, patient verbalized understanding and is in agreement with plan as outlined above.    Follow Up: 6 months   Tryce Surratt L. Alver Sorrow, MSN, APRN, AGNP-C Adult-Gerontology Nurse Practitioner [facility]  I have reviewed the note and agree with the APP's assessment as described in this progress note  Maylon Peppers, MD Gastroenterology and Hepatology [facility]astroenter[facility]

## 2022-05-12 ENCOUNTER — Telehealth: Payer: Self-pay | Admitting: Cardiology

## 2022-05-12 DIAGNOSIS — I4819 Other persistent atrial fibrillation: Secondary | ICD-10-CM

## 2022-05-12 MED ORDER — RIVAROXABAN 15 MG PO TABS: 15.0000 mg | ORAL_TABLET | Freq: Every day | ORAL | 0 refills | Status: DC

## 2022-05-12 NOTE — Telephone Encounter (Signed)
Xarelto 15 mg samples available for pick up

## 2022-05-12 NOTE — Telephone Encounter (Signed)
Pt is needing samples of Xarelto, she's in the doughnut hole. Just needs enough to get her into the new year.   She's going to be in town for a little while, she has an apt.   Please call her on her cell phone [phone]

## 2022-06-09 DIAGNOSIS — Z789 Other specified health status: Secondary | ICD-10-CM | POA: Diagnosis not present

## 2022-06-09 DIAGNOSIS — E039 Hypothyroidism, unspecified: Secondary | ICD-10-CM | POA: Diagnosis not present

## 2022-06-09 DIAGNOSIS — D6869 Other thrombophilia: Secondary | ICD-10-CM | POA: Diagnosis not present

## 2022-06-09 DIAGNOSIS — I10 Essential (primary) hypertension: Secondary | ICD-10-CM | POA: Diagnosis not present

## 2022-06-09 DIAGNOSIS — Z299 Encounter for prophylactic measures, unspecified: Secondary | ICD-10-CM | POA: Diagnosis not present

## 2022-06-09 DIAGNOSIS — I4891 Unspecified atrial fibrillation: Secondary | ICD-10-CM | POA: Diagnosis not present

## 2022-06-10 ENCOUNTER — Encounter: Payer: Self-pay | Admitting: Nurse Practitioner

## 2022-06-25 ENCOUNTER — Telehealth: Payer: Self-pay | Admitting: Cardiology

## 2022-06-25 ENCOUNTER — Ambulatory Visit: Payer: Medicare Other | Admitting: Cardiology

## 2022-06-25 DIAGNOSIS — Z299 Encounter for prophylactic measures, unspecified: Secondary | ICD-10-CM | POA: Diagnosis not present

## 2022-06-25 DIAGNOSIS — I5030 Unspecified diastolic (congestive) heart failure: Secondary | ICD-10-CM | POA: Diagnosis not present

## 2022-06-25 DIAGNOSIS — I8391 Asymptomatic varicose veins of right lower extremity: Secondary | ICD-10-CM | POA: Diagnosis not present

## 2022-06-25 DIAGNOSIS — I272 Pulmonary hypertension, unspecified: Secondary | ICD-10-CM | POA: Diagnosis not present

## 2022-06-25 DIAGNOSIS — J479 Bronchiectasis, uncomplicated: Secondary | ICD-10-CM | POA: Diagnosis not present

## 2022-06-25 NOTE — Telephone Encounter (Signed)
Pt son stated that patient injured her leg on 1/15. Son stated patient re-injured her leg today (1/18) while in the shower and lost quite a bit of blood. Pt was seen by PCP this morning who put 4 stitches in her leg. Pt's son would like to know if Xarelto needs to be reduced or stopped for the time being.  Please advise.

## 2022-06-25 NOTE — Telephone Encounter (Signed)
Pt c/o medication issue:  1. Name of Medication:  Rivaroxaban (XARELTO) 15 MG TABS tablet  2. How are you currently taking this medication (dosage and times per day)?  Once daily, at night  3. Are you having a reaction (difficulty breathing--STAT)?   4. What is your medication issue?   Patient's son states the patient scratched a vein in her lower right leg and was bleeding profusely. He states she had to get stitches and the bleeding just barely stopped. He would like to know if she needs to adjust blood thinner for now. Please advise.

## 2022-06-25 NOTE — Telephone Encounter (Signed)
Patients son notified and verbalized understanding. Patients son had no further questions or concerns at this time.

## 2022-07-03 DIAGNOSIS — I781 Nevus, non-neoplastic: Secondary | ICD-10-CM | POA: Diagnosis not present

## 2022-07-03 DIAGNOSIS — I10 Essential (primary) hypertension: Secondary | ICD-10-CM | POA: Diagnosis not present

## 2022-07-03 DIAGNOSIS — Z299 Encounter for prophylactic measures, unspecified: Secondary | ICD-10-CM | POA: Diagnosis not present

## 2022-07-16 DIAGNOSIS — I700 Atherosclerosis of aorta: Secondary | ICD-10-CM | POA: Diagnosis not present

## 2022-07-16 DIAGNOSIS — Z79899 Other long term (current) drug therapy: Secondary | ICD-10-CM | POA: Diagnosis not present

## 2022-07-16 DIAGNOSIS — I8310 Varicose veins of unspecified lower extremity with inflammation: Secondary | ICD-10-CM | POA: Diagnosis not present

## 2022-07-16 DIAGNOSIS — M810 Age-related osteoporosis without current pathological fracture: Secondary | ICD-10-CM | POA: Diagnosis not present

## 2022-07-16 DIAGNOSIS — E7800 Pure hypercholesterolemia, unspecified: Secondary | ICD-10-CM | POA: Diagnosis not present

## 2022-07-16 DIAGNOSIS — D225 Melanocytic nevi of trunk: Secondary | ICD-10-CM | POA: Diagnosis not present

## 2022-07-16 DIAGNOSIS — Z7189 Other specified counseling: Secondary | ICD-10-CM | POA: Diagnosis not present

## 2022-07-16 DIAGNOSIS — I5032 Chronic diastolic (congestive) heart failure: Secondary | ICD-10-CM | POA: Diagnosis not present

## 2022-07-16 DIAGNOSIS — Z299 Encounter for prophylactic measures, unspecified: Secondary | ICD-10-CM | POA: Diagnosis not present

## 2022-07-16 DIAGNOSIS — L570 Actinic keratosis: Secondary | ICD-10-CM | POA: Diagnosis not present

## 2022-07-16 DIAGNOSIS — Z0000 Encounter for general adult medical examination without abnormal findings: Secondary | ICD-10-CM | POA: Diagnosis not present

## 2022-07-16 DIAGNOSIS — J449 Chronic obstructive pulmonary disease, unspecified: Secondary | ICD-10-CM | POA: Diagnosis not present

## 2022-07-16 DIAGNOSIS — X32XXXD Exposure to sunlight, subsequent encounter: Secondary | ICD-10-CM | POA: Diagnosis not present

## 2022-07-16 DIAGNOSIS — I10 Essential (primary) hypertension: Secondary | ICD-10-CM | POA: Diagnosis not present

## 2022-07-16 DIAGNOSIS — Z1283 Encounter for screening for malignant neoplasm of skin: Secondary | ICD-10-CM | POA: Diagnosis not present

## 2022-07-16 DIAGNOSIS — R5383 Other fatigue: Secondary | ICD-10-CM | POA: Diagnosis not present

## 2022-07-17 ENCOUNTER — Other Ambulatory Visit (HOSPITAL_COMMUNITY): Payer: Self-pay | Admitting: Internal Medicine

## 2022-07-17 ENCOUNTER — Ambulatory Visit (HOSPITAL_COMMUNITY)
Admission: RE | Admit: 2022-07-17 | Discharge: 2022-07-17 | Disposition: A | Discharge status: Home / Self Care | Payer: Medicare Other | Source: Ambulatory Visit | Attending: Internal Medicine | Admitting: Internal Medicine

## 2022-07-17 DIAGNOSIS — R319 Hematuria, unspecified: Secondary | ICD-10-CM

## 2022-07-17 DIAGNOSIS — Z299 Encounter for prophylactic measures, unspecified: Secondary | ICD-10-CM | POA: Diagnosis not present

## 2022-07-17 DIAGNOSIS — I10 Essential (primary) hypertension: Secondary | ICD-10-CM | POA: Diagnosis not present

## 2022-07-17 DIAGNOSIS — Z8719 Personal history of other diseases of the digestive system: Secondary | ICD-10-CM | POA: Diagnosis not present

## 2022-07-27 ENCOUNTER — Telehealth: Payer: Self-pay | Admitting: Cardiology

## 2022-07-27 NOTE — Telephone Encounter (Signed)
Patient said that she was returning phone call from Dr. Myles Gip office.

## 2022-07-27 NOTE — Telephone Encounter (Signed)
Patient notified that [facility] did not call patient. Patient will await call back from provider that did call.

## 2022-08-03 ENCOUNTER — Other Ambulatory Visit: Payer: Self-pay | Admitting: Cardiology

## 2022-08-18 ENCOUNTER — Other Ambulatory Visit: Payer: Self-pay | Admitting: Cardiology

## 2022-08-18 DIAGNOSIS — I4819 Other persistent atrial fibrillation: Secondary | ICD-10-CM

## 2022-08-18 NOTE — Telephone Encounter (Signed)
Prescription refill request for Xarelto received.  Indication: AF Last office visit: 02/23/22  Gerrianne Scale PA-C Weight: 42.2kg Age: 88 Scr: 0.87 on 06/09/22 CrCl: 31.50  Based on above findings Xarelto '15mg'$  daily is the appropriate dose.  Refill approved.

## 2022-08-21 DIAGNOSIS — I5032 Chronic diastolic (congestive) heart failure: Secondary | ICD-10-CM | POA: Diagnosis not present

## 2022-08-21 DIAGNOSIS — I10 Essential (primary) hypertension: Secondary | ICD-10-CM | POA: Diagnosis not present

## 2022-08-21 DIAGNOSIS — D649 Anemia, unspecified: Secondary | ICD-10-CM | POA: Diagnosis not present

## 2022-08-21 DIAGNOSIS — M810 Age-related osteoporosis without current pathological fracture: Secondary | ICD-10-CM | POA: Diagnosis not present

## 2022-08-21 DIAGNOSIS — Z299 Encounter for prophylactic measures, unspecified: Secondary | ICD-10-CM | POA: Diagnosis not present

## 2022-08-24 ENCOUNTER — Encounter: Payer: Self-pay | Admitting: Internal Medicine

## 2022-08-31 NOTE — Progress Notes (Incomplete)
H&P  Chief Complaint: Gross hematuria  History of Present Illness: Shelly Fields is a 88 y.o. year old female with a history of atrial fibrillation, on Xarelto, presenting with a history of gross painless hematuria on July 17, 2022.  Several voids revealed blood in the toilet water.  She was having no associated lower urinary tract symptoms, abdominal or flank pain.  Does have a history of long-term exposure to secondhand smoke.  Her deceased husband smoked for a long time.  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  No recent hematuria or lower urinary tract symptoms.  Past Medical History:  Diagnosis Date   Cor pulmonale (chronic) (HCC)    Diverticulosis    Essential hypertension    Hypothyroidism    Internal hemorrhoids    Persistent atrial fibrillation (Shelly Fields)    Pulmonary hypertension (HCC)    Seasonal allergies    Shingles     Past Surgical History:  Procedure Laterality Date   ABDOMINAL HYSTERECTOMY     CATARACT EXTRACTION     COLONOSCOPY N/A 01/12/2013   Procedure: COLONOSCOPY;  Surgeon: Rogene Houston, MD;  [facility];  Service: Endoscopy;  Laterality: N/A;  1030   COLONOSCOPY N/A 11/18/2017   Procedure: COLONOSCOPY;  Surgeon: Rogene Houston, MD;  [facility];  Service: Endoscopy;  Laterality: N/A;   POLYPECTOMY  11/18/2017   Procedure: POLYPECTOMY;  Surgeon: Rogene Houston, MD;  [facility];  Service: Endoscopy;;  colon    Home Medications:  (Not in a hospital admission)   Allergies: No Known Allergies  Family History  Problem Relation Age of Onset   Heart disease Mother    Hypertension Father    Colon cancer Neg Hx     Social History:  reports that she has never smoked. She has been exposed to tobacco smoke. She has never used smokeless tobacco. She reports that she does not drink alcohol and does not use drugs.  ROS: A complete review of systems was performed.  All systems are negative except for  pertinent findings as noted.  Physical Exam:  Vital signs in last 24 hours: @VSRANGES @ General:  Alert and oriented, No acute distress HEENT: Normocephalic, atraumatic Neck: No JVD or lymphadenopathy Cardiovascular: Regular rate  Lungs: Normal inspiratory/expiratory excursionct  I have reviewed prior pt notes  I have reviewed notes from referring/previous physicians  I have reviewed urinalysis results--microscopic hematuria noted today.  I have independently reviewed prior imaging--recent KUB was negative    Impression/Assessment:  Gross painless hematuria, resolved and patient with high risk history for urothelial carcinomas.  Plan:  I will check basic metabolic panel today  I will bring her back for cystoscopy following CT hematuria protocol  Shelly Fields 08/31/2022, 8:34 PM  Shelly Boxer. Shelly Vivero MD

## 2022-09-01 ENCOUNTER — Ambulatory Visit: Payer: Medicare Other | Admitting: Urology

## 2022-09-01 ENCOUNTER — Encounter: Payer: Self-pay | Admitting: Urology

## 2022-09-01 VITALS — BP 159/82 | HR 76 | Ht 62.0 in | Wt 89.7 lb

## 2022-09-01 DIAGNOSIS — R310 Gross hematuria: Secondary | ICD-10-CM | POA: Diagnosis not present

## 2022-09-01 LAB — MICROSCOPIC EXAMINATION: Bacteria, UA: NONE SEEN

## 2022-09-01 LAB — URINALYSIS, ROUTINE W REFLEX MICROSCOPIC
Bilirubin, UA: NEGATIVE
Glucose, UA: NEGATIVE
Ketones, UA: NEGATIVE
Nitrite, UA: NEGATIVE
Specific Gravity, UA: 1.015 (ref 1.005–1.030)
Urobilinogen, Ur: 0.2 mg/dL (ref 0.2–1.0)
pH, UA: 7 (ref 5.0–7.5)

## 2022-09-03 DIAGNOSIS — I10 Essential (primary) hypertension: Secondary | ICD-10-CM | POA: Diagnosis not present

## 2022-09-03 DIAGNOSIS — H6121 Impacted cerumen, right ear: Secondary | ICD-10-CM | POA: Diagnosis not present

## 2022-09-03 DIAGNOSIS — Z299 Encounter for prophylactic measures, unspecified: Secondary | ICD-10-CM | POA: Diagnosis not present

## 2022-09-09 NOTE — Progress Notes (Signed)
Cardiology Office Note  Date: 09/10/2022   ID: Shelly Fields, DOB 1935/04/01, MRN [id number]  History of Present Illness: Shelly Fields is an 88 y.o. female last seen in September 2023 by Ms. Vita Barley, I reviewed her note.  She is here for a routine visit.  Reports reasonable control of her leg edema on current dose of Demadex.  I note that her weight is up, but she remains symptomatically stable with NYHA class II dyspnea.  She does not report any palpitations in atrial fibrillation and remains on Xarelto for stroke prophylaxis along with Toprol-XL for heart rate control.  She does report having hematuria, currently being evaluated by urology with CT imaging planned.  Reviewed her most recent lab work as noted below.  Physical Exam: VS:  BP 124/74   Pulse 77   Ht 5\' 2"  (1.575 m)   Wt 96 lb 12.8 oz (43.9 kg)   SpO2 97%   BMI 17.70 kg/m , BMI Body mass index is 17.7 kg/m.  Wt Readings from Last 3 Encounters:  09/10/22 96 lb 12.8 oz (43.9 kg)  09/01/22 89 lb 11.2 oz (40.7 kg)  05/05/22 89 lb 11.2 oz (40.7 kg)    General: Patient appears comfortable at rest. HEENT: Conjunctiva and lids normal. Neck: Supple, no elevated JVP or carotid bruits. Lungs: Irregularly irregular, 2/6 stock murmur. Cardiac: Regular rate and rhythm, no S3 or significant systolic murmur, no pericardial rub. Extremities: Lower extremity edema, mild with venous varicosities and stasis changes..  ECG:  An ECG dated 01/20/2022 was personally reviewed today and demonstrated:  Atrial fibrillation with rightward axis, IVCD, nonspecific ST changes.  Labwork: 01/20/2022: ALT 18; AST 25; B Natriuretic Peptide 510.0; Hemoglobin 13.3; Platelets 263 02/18/2022: BUN 19; Creatinine, Ser 0.85; Potassium 4.3; Sodium 130  January 2024: BUN 16, creatinine 0.87, potassium 4.4, AST 22, ALT 20, TSH 0.152 March 2024: Hemoglobin 11.5, platelets 159  Other Studies Reviewed Today:  Echocardiogram 02/25/2022:  1. Left  ventricular ejection fraction, by estimation, is 60 to 65%. The  left ventricle has normal function. The left ventricle has no regional  wall motion abnormalities. Left ventricular diastolic parameters are  indeterminate. There is the  interventricular septum is flattened in systole and diastole, consistent  with right ventricular pressure and volume overload. The average left  ventricular global longitudinal strain is -19.8 %. The global longitudinal  strain is normal.   2. Right ventricular systolic function is normal. The right ventricular  size is severely enlarged. There is mildly elevated pulmonary artery  systolic pressure. The estimated right ventricular systolic pressure is  [id number] mmHg.   3. Left atrial size was moderately dilated.   4. Right atrial size was markedly dilated.   5. A small pericardial effusion is present. The pericardial effusion is  posterior to the left ventricle.   6. The mitral valve is abnormal. Mild mitral valve regurgitation.   7. Tricuspid valve regurgitation is severe.   8. The aortic valve is tricuspid. Aortic valve regurgitation is mild.  Aortic regurgitation PHT measures 628 msec.   9. The inferior vena cava is dilated in size with <50% respiratory  variability, suggesting right atrial pressure of 15 mmHg.   Assessment and Plan:  1.  Permanent atrial fibrillation with CHA2DS2-VASc score of 4.  Plan to continue heart rate control with Toprol-XL and stroke prophylaxis with Xarelto.  She is undergoing concurrent workup for hematuria with urology.  CT imaging planned.  If she needs any invasive procedures,  we can help guide temporary discontinuation of Xarelto.  2.  HFpEF with LVEF 60 to 65%, severely dilated RV with mildly elevated PASP 36 mmHg by echocardiogram in September 2023.  She uses compression stockings and remains on stable dose of Demadex for control of leg swelling otherwise with NYHA class II dyspnea.  I did mention possibility of adding an  SGLT2 inhibitor, for now she was comfortable with the current course.  3.  Essential hypertension.  Disposition:  Follow up  6 months.  Signed, Satira Sark, M.D., F.A.C.C. [place] at [facility]

## 2022-09-10 ENCOUNTER — Ambulatory Visit: Payer: Medicare Other | Attending: Cardiology | Admitting: Cardiology

## 2022-09-10 ENCOUNTER — Encounter: Payer: Self-pay | Admitting: Cardiology

## 2022-09-10 VITALS — BP 124/74 | HR 77 | Ht 62.0 in | Wt 96.8 lb

## 2022-09-10 DIAGNOSIS — I4821 Permanent atrial fibrillation: Secondary | ICD-10-CM

## 2022-09-10 DIAGNOSIS — I5032 Chronic diastolic (congestive) heart failure: Secondary | ICD-10-CM

## 2022-09-10 NOTE — Patient Instructions (Signed)
Medication Instructions:  Your physician recommends that you continue on your current medications as directed. Please refer to the Current Medication list given to you today.   Labwork: None today  Testing/Procedures: None today  Follow-Up: 6 months  Any Other Special Instructions Will Be Listed Below (If Applicable).  If you need a refill on your cardiac medications before your next appointment, please call your pharmacy.  

## 2022-09-21 DIAGNOSIS — J449 Chronic obstructive pulmonary disease, unspecified: Secondary | ICD-10-CM | POA: Diagnosis not present

## 2022-09-21 DIAGNOSIS — J029 Acute pharyngitis, unspecified: Secondary | ICD-10-CM | POA: Diagnosis not present

## 2022-09-21 DIAGNOSIS — Z299 Encounter for prophylactic measures, unspecified: Secondary | ICD-10-CM | POA: Diagnosis not present

## 2022-09-21 DIAGNOSIS — I5032 Chronic diastolic (congestive) heart failure: Secondary | ICD-10-CM | POA: Diagnosis not present

## 2022-09-21 DIAGNOSIS — I700 Atherosclerosis of aorta: Secondary | ICD-10-CM | POA: Diagnosis not present

## 2022-09-22 ENCOUNTER — Ambulatory Visit (HOSPITAL_COMMUNITY)
Admission: RE | Admit: 2022-09-22 | Discharge: 2022-09-22 | Disposition: A | Discharge status: Home / Self Care | Payer: Medicare Other | Source: Ambulatory Visit | Attending: Urology | Admitting: Urology

## 2022-09-22 DIAGNOSIS — R310 Gross hematuria: Secondary | ICD-10-CM | POA: Diagnosis not present

## 2022-09-22 DIAGNOSIS — K5730 Diverticulosis of large intestine without perforation or abscess without bleeding: Secondary | ICD-10-CM | POA: Diagnosis not present

## 2022-09-22 DIAGNOSIS — N200 Calculus of kidney: Secondary | ICD-10-CM | POA: Diagnosis not present

## 2022-09-22 LAB — POCT I-STAT CREATININE: Creatinine, Ser: 0.8 mg/dL (ref 0.44–1.00)

## 2022-09-22 MED ORDER — IOHEXOL 300 MG/ML  SOLN
100.0000 mL | Freq: Once | INTRAMUSCULAR | Status: AC | PRN
  Administered 2022-09-22: 100 mL via INTRAVENOUS

## 2022-09-29 DIAGNOSIS — M810 Age-related osteoporosis without current pathological fracture: Secondary | ICD-10-CM | POA: Diagnosis not present

## 2022-10-05 ENCOUNTER — Telehealth: Payer: Self-pay

## 2022-10-05 NOTE — Progress Notes (Signed)
History of Present Illness: Shelly Fields is a 88 y.o. year old female for f/u of hematuria.   Past Medical History:  Diagnosis Date   Cor pulmonale (chronic) (HCC)    Diverticulosis    Essential hypertension    Hypothyroidism    Internal hemorrhoids    Persistent atrial fibrillation (HCC)    Pulmonary hypertension (HCC)    Seasonal allergies    Shingles     Past Surgical History:  Procedure Laterality Date   ABDOMINAL HYSTERECTOMY     CATARACT EXTRACTION     COLONOSCOPY N/A 01/12/2013   Procedure: COLONOSCOPY;  Surgeon: Malissa Hippo, MD;  [facility];  Service: Endoscopy;  Laterality: N/A;  1030   COLONOSCOPY N/A 11/18/2017   Procedure: COLONOSCOPY;  Surgeon: Malissa Hippo, MD;  [facility];  Service: Endoscopy;  Laterality: N/A;   POLYPECTOMY  11/18/2017   Procedure: POLYPECTOMY;  Surgeon: Malissa Hippo, MD;  [facility];  Service: Endoscopy;;  colon    Home Medications:  (Not in a hospital admission)   Allergies: No Known Allergies  Family History  Problem Relation Age of Onset   Heart disease Mother    Hypertension Father    Colon cancer Neg Hx     Social History:  reports that she has never smoked. She has been exposed to tobacco smoke. She has never used smokeless tobacco. She reports that she does not drink alcohol and does not use drugs.  ROS: A complete review of systems was performed.  All systems are negative except for pertinent findings as noted.  Physical Exam:  Vital signs in last 24 hours: @VSRANGES @ General:  Alert and oriented, No acute distress HEENT: Normocephalic, atraumatic Neck: No JVD or lymphadenopathy Cardiovascular: Regular rate  Lungs: Normal inspiratory/expiratory excursion Abdomen: Soft, nontender, nondistended, no abdominal masses Back: No CVA tenderness Extremities: No edema Neurologic: Grossly intact  I have reviewed prior pt notes  I have reviewed urinalysis results  I have  independently reviewed prior imaging--CT from 09/25/22-- IMPRESSION: Bilateral nephrolithiasis. No evidence of ureteral calculi, hydronephrosis, or other acute findings. No radiographic evidence of urinary tract neoplasm. Moderate hepatomegaly Colonic diverticulosis, without radiographic evidence of diverticulitis. Large stool burden noted; recommend clinical correlation for possible constipation. Severe cardiomegaly, with findings of right heart insufficiency. Small pericardial effusion and right pleural effusion. Aortic Atherosclerosis (ICD10-I70.0)  I have reviewed prior urine culture   Cystoscopy Procedure Note:  Indication: ***  After informed consent and discussion of the procedure and its risks, Shelly Fields was positioned and prepped in the standard fashion.  Cystoscopy was performed with a flexible cystoscope.   Findings: Urethra:*** Prostate:*** Bladder neck:*** Ureteral orifices:*** Bladder:***  The patient tolerated the procedure well.     Impression/Assessment:  ***  Plan:  Bertram Millard Fishel Wamble 10/05/2022, 8:38 AM  Bertram Millard. Cindia Hustead MD

## 2022-10-05 NOTE — Telephone Encounter (Signed)
Left detailed message making patient aware there a tiny stones in your kidneys, none that are passing.  No other kidney abnormalities.  Evidence of constipation-drink plenty of fluids, take fiber supplements.

## 2022-10-06 ENCOUNTER — Ambulatory Visit: Payer: Medicare Other | Admitting: Urology

## 2022-10-06 VITALS — BP 161/83 | HR 75

## 2022-10-06 DIAGNOSIS — R310 Gross hematuria: Secondary | ICD-10-CM

## 2022-10-06 DIAGNOSIS — N200 Calculus of kidney: Secondary | ICD-10-CM | POA: Diagnosis not present

## 2022-10-06 LAB — URINALYSIS, ROUTINE W REFLEX MICROSCOPIC
Bilirubin, UA: NEGATIVE
Glucose, UA: NEGATIVE
Ketones, UA: NEGATIVE
Nitrite, UA: NEGATIVE
Protein,UA: NEGATIVE
Specific Gravity, UA: 1.015 (ref 1.005–1.030)
Urobilinogen, Ur: 0.2 mg/dL (ref 0.2–1.0)
pH, UA: 7 (ref 5.0–7.5)

## 2022-10-06 LAB — MICROSCOPIC EXAMINATION
Bacteria, UA: NONE SEEN
RBC, Urine: >30 /hpf — AB (ref 0–2)

## 2022-10-06 MED ORDER — CIPROFLOXACIN HCL 500 MG PO TABS
500.0000 mg | ORAL_TABLET | Freq: Once | ORAL | Status: AC
Start: 2022-10-06 — End: 2022-10-06
  Administered 2022-10-06: 500 mg via ORAL

## 2022-10-14 ENCOUNTER — Other Ambulatory Visit: Payer: Self-pay | Admitting: Cardiology

## 2022-11-05 ENCOUNTER — Ambulatory Visit (INDEPENDENT_AMBULATORY_CARE_PROVIDER_SITE_OTHER): Payer: Medicare Other | Admitting: Gastroenterology

## 2022-11-05 ENCOUNTER — Encounter (INDEPENDENT_AMBULATORY_CARE_PROVIDER_SITE_OTHER): Payer: Self-pay | Admitting: Gastroenterology

## 2022-11-05 VITALS — BP 181/78 | HR 79 | Temp 97.1°F | Ht 62.0 in | Wt 96.0 lb

## 2022-11-05 DIAGNOSIS — K5900 Constipation, unspecified: Secondary | ICD-10-CM | POA: Diagnosis not present

## 2022-11-05 NOTE — Patient Instructions (Signed)
We will continue with metamucil since this is working for you As you are feeling well, we can plan to see you on an as needed basis, please let me know if you have any new or worsening concerns   It was a pleasure to see you today. I want to create trusting relationships with patients and provide genuine, compassionate, and quality care. I truly value your feedback! please be on the lookout for a survey regarding your visit with me today. I appreciate your input about our visit and your time in completing this!    Jhane Lorio L. Jeanmarie Hubert, MSN, APRN, AGNP-C Adult-Gerontology Nurse Practitioner [facility] at [facility]

## 2022-11-05 NOTE — Progress Notes (Addendum)
Referring Provider: Wendall Papa, NP Primary Care Physician:  Wendall Papa Primary GI Physician: Levon Hedger   Chief Complaint  Patient presents with   Follow-up    Patient here today for a follow up visit on her rectal bleeding. Patient says she has not seen any dark or bloody stools as of late. Patient says she is taking metamucil 58.6 % packet daily and this has helped her tremendously.   HPI:   Shelly Fields is a 88 y.o. female with past medical history of cor pulmonale, HTN, hypothyroidism, A fib, pulmonary HTN.    Patient presenting today for follow up of rectal bleeding  Last seen November 2023, at that time, doing well on metamucil with 1 regular stool per day. Seeing some blood on toilet tissue but infrequently. Some improvement with anusol for hemorrhoids. Hgb 13.3 in September.   Recommended to continue with anusol PRN, start benefiber 1T1-2x/day, good water intake, avoid straining, limit toilet time.  Present: Patient states she is doing well. Her PCP had recommended metamucil which has been great for her. She is taking metamucil daily and having a BM daily. She denies recent rectal bleeding, none in months. She notes very occasionally she will see a swipe of blood on toilet tissue. She is not using anusol currently. She denies any rectal itching, burning or pain. Denies abdominal pain. Weight fluctuates some with her diuretics. No overt significant weight loss. Appetite is good. She watches her sodium. She continues to do an exercise class with some friends atleast once per week. Overall feels she is doing good.   She notes some hematuria a few months back. No further frank hematuria but had blood in urine on urinalysis. Seeing urology. She had CT A/p in April which showed some stool burden   Last Colonoscopy:11/18/17 - Perianal skin tags found on perianal exam. - One small polyp in the proximal transverse colon. Biopsied-tubular adenoma  - Diverticulosis in the  sigmoid colon. - Melanosis in the colon. - External and internal hemorrhoids. Last Endoscopy: never   Past Medical History:  Diagnosis Date   Cor pulmonale (chronic) (HCC)    Diverticulosis    Essential hypertension    Hypothyroidism    Internal hemorrhoids    Persistent atrial fibrillation (HCC)    Pulmonary hypertension (HCC)    Seasonal allergies    Shingles     Past Surgical History:  Procedure Laterality Date   ABDOMINAL HYSTERECTOMY     CATARACT EXTRACTION     COLONOSCOPY N/A 01/12/2013   Procedure: COLONOSCOPY;  Surgeon: Malissa Hippo, MD;  [facility];  Service: Endoscopy;  Laterality: N/A;  1030   COLONOSCOPY N/A 11/18/2017   Procedure: COLONOSCOPY;  Surgeon: Malissa Hippo, MD;  [facility];  Service: Endoscopy;  Laterality: N/A;   POLYPECTOMY  11/18/2017   Procedure: POLYPECTOMY;  Surgeon: Malissa Hippo, MD;  [facility];  Service: Endoscopy;;  colon    Current Outpatient Medications  Medication Sig Dispense Refill   ALPRAZolam (XANAX) 0.25 MG tablet Take 0.25 mg by mouth daily as needed for sleep.     Carboxymethylcellulose Sodium (THERATEARS OP) Place 1 drop into both eyes daily.      denosumab (PROLIA) 60 MG/ML SOSY injection Inject 60 mg into the skin every 6 (six) months.     hydrocortisone (ANUSOL-HC) 2.5 % rectal cream Place 1 Application rectally 3 (three) times daily. Apply three times per day x10 days then PRN thereafter 60  g 1   levothyroxine (SYNTHROID) 75 MCG tablet Take 75 mcg by mouth daily before breakfast.     metoprolol succinate (TOPROL-XL) 50 MG 24 hr tablet TAKE 1 AND 1/2 TABLETS BY MOUTH  TWICE DAILY 300 tablet 2   psyllium (METAMUCIL) 58.6 % packet Take 1 packet by mouth daily.     Rivaroxaban (XARELTO) 15 MG TABS tablet TAKE 1 TABLET BY MOUTH DAILY  WITH SUPPER 100 tablet 1   torsemide (DEMADEX) 20 MG tablet TAKE 1 TABLET BY MOUTH DAILY 100 tablet 2   No current facility-administered medications for  this visit.    Allergies as of 11/05/2022   (No Known Allergies)    Family History  Problem Relation Age of Onset   Heart disease Mother    Hypertension Father    Colon cancer Neg Hx     Social History   Socioeconomic History   Marital status: Widowed    Spouse name: Not on file   Number of children: Not on file   Years of education: Not on file   Highest education level: Not on file  Occupational History   Not on file  Tobacco Use   Smoking status: Never    Passive exposure: Past   Smokeless tobacco: Never  Vaping Use   Vaping Use: Never used  Substance and Sexual Activity   Alcohol use: No    Alcohol/week: 0.0 standard drinks of alcohol   Drug use: No   Sexual activity: Not on file  Other Topics Concern   Not on file  Social History Narrative   Not on file   Social Determinants of Health   Financial Resource Strain: Not on file  Food Insecurity: Not on file  Transportation Needs: Not on file  Physical Activity: Not on file  Stress: Not on file  Social Connections: Not on file   Review of systems General: negative for malaise, night sweats, fever, chills, weight loss Neck: Negative for lumps, goiter, pain and significant neck swelling Resp: Negative for cough, wheezing, dyspnea at rest CV: Negative for chest pain, leg swelling, palpitations, orthopnea GI: denies melena, hematochezia, nausea, vomiting, diarrhea, constipation, dysphagia, odyonophagia, early satiety or unintentional weight loss.  MSK: Negative for joint pain or swelling, back pain, and muscle pain. Derm: Negative for itching or rash Psych: Denies depression, anxiety, memory loss, confusion. No homicidal or suicidal ideation.  Heme: Negative for prolonged bleeding, bruising easily, and swollen nodes. Endocrine: Negative for cold or heat intolerance, polyuria, polydipsia and goiter. Neuro: negative for tremor, gait imbalance, syncope and seizures. The remainder of the review of systems is  noncontributory.  Physical Exam: BP (!) 181/78 (BP Location: Left Arm, Patient Position: Sitting, Cuff Size: Small)   Pulse 79   Temp (!) 97.1 F (36.2 C) (Temporal)   Ht 5\' 2"  (1.575 m)   Wt 96 lb (43.5 kg)   BMI 17.56 kg/m  General:   Alert and oriented. No distress noted. Pleasant and cooperative.  Head:  Normocephalic and atraumatic. Eyes:  Conjuctiva clear without scleral icterus. Mouth:  Oral mucosa pink and moist. Good dentition. No lesions. Heart: Normal rate and rhythm, s1 and s2 heart sounds present.  Lungs: Clear lung sounds in all lobes. Respirations equal and unlabored. Abdomen:  +BS, soft, non-tender and non-distended. No rebound or guarding. Enlarged, palpable liver.  Derm: No palmar erythema or jaundice Msk:  Symmetrical without gross deformities. Normal posture. Extremities:  Without edema. Neurologic:  Alert and  oriented x4 Psych:  Alert and  cooperative. Normal mood and affect.  Invalid input(s): "6 MONTHS"   ASSESSMENT: Shelly Fields is a 88 y.o. female presenting today for follow up of constipation and rectal bleeding thought secondary to hemorrhoids.  Doing well. Taking metamucil and having 1 BM per day. No abdominal pain. Appetite is good. Has not had rectal bleeding in months. She denies rectal itching, burning or pain. Her liver is enlarged and palpable on exam. Last LFTs at the end of 2023 were normal, recent CT with hepatomegaly and reflux of contrast into IVC/hepatic veins, suspect hepatomegaly is secondary to her history of heart failure given these findings.  She prefers to come back to see [place] on an as needed basis at this point as she is not having any GI issues which I think is reasonable. She will let me know if she has new or worsening GI symptoms.    PLAN:  Continue with metamucil  Pt will make me aware of new or worsening GI symptoms   All questions were answered, patient verbalized understanding and is in agreement with plan as outlined  above.   Follow Up: PRN  Indya Oliveria L. Jeanmarie Hubert, MSN, APRN, AGNP-C Adult-Gerontology Nurse Practitioner [facility]  I have reviewed the note and agree with the APP's assessment as described in this progress note  Katrinka Blazing, MD Gastroenterology and Hepatology [facility]

## 2022-11-12 ENCOUNTER — Other Ambulatory Visit (HOSPITAL_COMMUNITY): Payer: Self-pay | Admitting: Nurse Practitioner

## 2022-11-12 DIAGNOSIS — Z1231 Encounter for screening mammogram for malignant neoplasm of breast: Secondary | ICD-10-CM

## 2022-11-23 ENCOUNTER — Ambulatory Visit (HOSPITAL_COMMUNITY)
Admission: RE | Admit: 2022-11-23 | Discharge: 2022-11-23 | Disposition: A | Discharge status: Home / Self Care | Payer: Medicare Other | Source: Ambulatory Visit | Attending: Internal Medicine | Admitting: Internal Medicine

## 2022-11-23 ENCOUNTER — Telehealth: Payer: Self-pay | Admitting: Cardiology

## 2022-11-23 ENCOUNTER — Encounter: Payer: Self-pay | Admitting: Internal Medicine

## 2022-11-23 ENCOUNTER — Ambulatory Visit: Payer: Medicare Other | Admitting: Internal Medicine

## 2022-11-23 VITALS — BP 133/67 | HR 78 | Ht 62.0 in | Wt 98.0 lb

## 2022-11-23 DIAGNOSIS — I272 Pulmonary hypertension, unspecified: Secondary | ICD-10-CM | POA: Insufficient documentation

## 2022-11-23 DIAGNOSIS — I10 Essential (primary) hypertension: Secondary | ICD-10-CM | POA: Diagnosis not present

## 2022-11-23 DIAGNOSIS — I5032 Chronic diastolic (congestive) heart failure: Secondary | ICD-10-CM

## 2022-11-23 DIAGNOSIS — J479 Bronchiectasis, uncomplicated: Secondary | ICD-10-CM

## 2022-11-23 NOTE — Progress Notes (Signed)
Subjective:     Patient ID: Shelly Fields, female   DOB: 15-Apr-1935,    MRN: [id number]    Brief patient profile:  53  yowf never smoked started with  Chronic cough which developed 12/2015 mostly with bending over with mostly productive mucus slt yellowish with steaks of blood while on xarelto but for the most part no blood and w/u bronchiolectasis with  Dx ? MAI in [place] so referred to pulmonary clinic 03/22/2017 by Shelly Fields     History of Present Illness  03/22/2017 1st  Pulmonary Fields visit/ Shelly Fields   Chief Complaint  Patient presents with   Pulmonary Consult    Referred by Dr. Sherril Croon for eval of [facility]. Pt c/o occ dry cough.   most days x 15 months only "spits up" mucus (clears throat "I don't cough" )  in afternoons < tbsp yellowish thick mucus  Not limited by breathing from desired activities   Assoc with voice fatigue rec Pantoprazole (protonix) 40 mg   Take  30-60 min before first meal of the day and Pepcid (famotidine)  20 mg one @  bedtime until return to Fields - this is the best way to tell whether stomach acid is contributing to your problem.   GERD (REFLUX)  Diet    05/13/2017  f/u ov/Shelly Fields re:   MAI/ cough has resolved on GERD rx  Not limited by breathing from desired activities   No am excess mucus rec No change in your medications until the pantaprazole (protonix)  expires (all the refills)  Then stop it Continue the diet and the pepcid (famotidine) at bedtime and when you run out of the pantaprazole refills then take the pepcid after bfast also     02/13/2020  f/u ov/Shelly Fields/Shelly Fields re: bronchiectasis / possible MAI  Chief Complaint  Patient presents with   Follow-up    Breathing has overall been doing well. She has some brown sputum- minimal some mornings.    Dyspnea:  MMRC1 = can walk nl pace, flat grade, can't hurry or go uphills or steps s sob   Cough: better overall / min darker just in am sev x a week Sleeping: bed blocks ok  SABA use: no  inhalers 02: none Rec If the mucus is getting nastier or more plentiful > levaquin 500 mg daily x 10 days - ok to call for refills   11/27/2020  f/u ov/Orient Fields/Shelly Fields re: bronchiectasis  Chief Complaint  Patient presents with   Follow-up    Breathing is unchanged since the last visit. She is not coughing much and feels she is overall doing well.    Dyspnea:  MMRC1 = can walk nl pace, flat grade, can't hurry or go uphills or steps s sob   Cough: minimal  Sleeping: bed blocks  SABA use: none  02: none  Covid status: x 3 vax  Rec For nasty mucus > levaquin 500 mg daily x 10 days (keep on hand)     11/23/2022  Yearly f/u ov/Shelly Fields/Shelly Fields re: bronchiectasis maint on no rx/ has prn levaquin but not using   Chief Complaint  Patient presents with   Follow-up  Dyspnea: says no change -  MMRC2 = can't walk a nl pace on a flat grade s sob but does fine slow and flat eg [employer] shopping  Cough: none Sleeping: bed blocks > no resp cc  SABA use: none  02: none   Main concern is leg swelling R> L worse and needing extra  dose demadex now more freqently    No obvious day to day or daytime variability or assoc excess/ purulent sputum or mucus plugs or hemoptysis or cp or chest tightness, subjective wheeze or overt sinus or hb symptoms.   Sleeping  without nocturnal  or early am exacerbation  of respiratory  c/o's or need for noct saba. Also denies any obvious fluctuation of symptoms with weather or environmental changes or other aggravating or alleviating factors except as outlined above   No unusual exposure hx or h/o childhood pna/ asthma or knowledge of premature birth.  Current Allergies, Complete Past Medical History, Past Surgical History, Family History, and Social History were reviewed in [employer] record.  ROS  The following are not active complaints unless bolded Hoarseness, sore throat, dysphagia, dental problems, itching, sneezing,  nasal  congestion or discharge of excess mucus or purulent secretions, ear ache,   fever, chills, sweats, unintended wt loss or wt gain, classically pleuritic or exertional cp,  orthopnea pnd or arm/hand swelling  or leg swelling, presyncope, palpitations, abdominal pain, anorexia, nausea, vomiting, diarrhea  or change in bowel habits or change in bladder habits, change in stools or change in urine, dysuria, hematuria,  rash, arthralgias, visual complaints, headache, numbness, weakness or ataxia or problems with walking or coordination,  change in mood or  memory.        Current Meds  Medication Sig   ALPRAZolam (XANAX) 0.25 MG tablet Take 0.25 mg by mouth daily as needed for sleep.   hydrocortisone (ANUSOL-HC) 2.5 % rectal cream Place 1 Application rectally 3 (three) times daily. Apply three times per day x10 days then PRN thereafter   levothyroxine (SYNTHROID) 75 MCG tablet Take 75 mcg by mouth daily before breakfast.   metoprolol succinate (TOPROL-XL) 50 MG 24 hr tablet TAKE 1 AND 1/2 TABLETS BY MOUTH  TWICE DAILY   psyllium (METAMUCIL) 58.6 % packet Take 1 packet by mouth daily.   Rivaroxaban (XARELTO) 15 MG TABS tablet TAKE 1 TABLET BY MOUTH DAILY  WITH SUPPER   torsemide (DEMADEX) 20 MG tablet TAKE 1 TABLET BY MOUTH DAILY                     Objective:   Physical Exam    Wts  11/23/2022       98 11/27/2021       99  11/27/2020       99  02/13/2020          100 11/28/2019       101 11/28/2018        97   02/11/2018          100  08/11/2017         102   05/13/2017      102   03/22/17 101 lb (45.8 kg)  03/18/17 101 lb (45.8 kg)  09/15/16 97 lb 12.8 oz (44.4 kg)      Vital signs reviewed  11/23/2022  - Note at rest 02 sats  97% on RA   General appearance:    amb wf extremely hard of hearing    HEENT : Oropharynx  clear        NECK :  without  apparent JVD/ palpable Nodes/TM    LUNGS: no acc muscle use,  Mod kyphotic contour chest which is clear to A and P bilaterally without cough on  insp or exp maneuvers   CV: IRIR   no s3 or murmur or  increase in P2, and pitting edema R > L LE   ABD:  soft and nontender with nl inspiratory excursion in the supine position. No bruits or organomegaly appreciated   MS:  Nl gait/ ext warm without deformities Or obvious joint restrictions  calf tenderness, cyanosis or clubbing    SKIN: warm and dry without lesions    NEURO:  alert, approp, nl sensorium with  no motor or cerebellar deficits apparent.   CXR PA and Lateral:   11/23/2022 :    I personally reviewed images and impression is as follows:     Mod T kyphosis / chronic changes R > L base   Assessment:

## 2022-11-23 NOTE — Assessment & Plan Note (Signed)
Echocardiogram 02/01/2017 [facility]): Normal LV wall thickness with LVEF 65-70%, normal left atrial chamber size, moderate right atrial enlargement, mildly calcified aortic valve with mild aortic regurgitation, mild mitral annular calcification with mild mitral regurgitation, severe tricuspid regurgitation with RVSP 30-40 mmHg ECHO  09/08/17   - Left ventricle:  Normal but  the study was not technically sufficient to allow evaluation of LV diastolic dysfunction due to AF - Aortic valve: There was mild regurgitation. - Mitral valve: There was mild to moderate regurgitation. - Left atrium: The atrium was severely dilated. - Right atrium: The atrium was massively dilated. - Tricuspid valve: There was severe regurgitation. - Systemic veins: IVC is dilated with normal respiratory variation.  Estimated CVP 8 mmHg. Echo 02/25/22 1. LV ok x there is interventricular septum is flattened in systole and diastole, consistent with right ventricular pressure and volume overload. The average left  ventricular global longitudinal strain is -19.8 %. The global longitudinal  strain is normal.   2. Right ventricular systolic function is normal. The right ventricular  size is severely enlarged. There is mildly elevated pulmonary artery  systolic pressure. The estimated right ventricular systolic pressure is  36.3 mmHg.   3. Left atrial size was moderately dilated.   4. Right atrial size was markedly dilated.   5. A small pericardial effusion is present. The pericardial effusion is  posterior to the left ventricle.   6. The mitral valve is abnormal. Mild mitral valve regurgitation.   7. Tricuspid valve regurgitation is severe.   8. The aortic valve is tricuspid. Aortic valve regurgitation is mild.  Aortic regurgitation PHT measures 628 msec.   9. The inferior vena cava is dilated in size with <50% respiratory  variability, suggesting right atrial pressure of 15 mmHg.  Comparison(s): Prior images  reviewed side by side. LVEF remains normal  range wtih LV septal flattening consistent with RV volume overload mainly.  There is severe RV enlargement and severe tricuspid regurgitation with  mildly estimated RVSP 36 mmHg.    Clearly worse by last echo and now seeing LE edema more refractory to demadex so strongly consider careful titration of aldactone and in meantime will check ono on RA to see if nocturnal hypoxemia now  contributing to what is likely WHO 2 and 3 PH - would need RHC to sort this out.   Discussed in detail all the  indications, usual  risks and alternatives  relative to the benefits with patient who agrees to proceed with w/u as outlined.

## 2022-11-23 NOTE — Assessment & Plan Note (Signed)
Onset of cough 2017/never smoker  Chest CT  01/06/17  C/w MAI with "bronciolectasis" L > R bases / no real change since  02/11/16  but new findings c/w PH - PFT's  05/13/2017  FEV1 1.54 (85 % ) ratio 64   p nothing prior to study with DLCO  59/63c % corrects to 103 % for alv volume   - CT 08/23/18 no change  - CT chest 12/28/2019 1. Similar appearance of cylindrical bronchiectasis in the right middle lobe with associated airway impaction and clustered micro nodularity. 2. Similar appearance of cylindrical bronchiectasis in the left lower lobe although there is more pronounced airway impaction in the posterior left lower lobe today than on the previous exam. cxr 11/27/2020 no change bilateral scarring   No recent flares, no need for bronchodilators.  Has levaquin to take for nasty/ bloody mucus but so far not needed.          Each maintenance medication was reviewed in detail including emphasizing most importantly the difference between maintenance and prns and under what circumstances the prns are to be triggered using an action plan format where appropriate.  Total time for H and P, chart review, counseling,  and generating customized AVS unique to this office visit / same day charting > 30 min

## 2022-11-23 NOTE — Telephone Encounter (Signed)
Pt c/o medication issue:  1. Name of Medication:   torsemide (DEMADEX) 20 MG tablet    2. How are you currently taking this medication (dosage and times per day)?  TAKE 1 TABLET BY MOUTH DAILY     3. Are you having a reaction (difficulty breathing--STAT)? No  4. What is your medication issue? Pt would like to know if its okay for her to take one in the morning and one at night. She'd like a callback. Please advise.

## 2022-11-23 NOTE — Telephone Encounter (Signed)
Pt c/o ankle and leg swelling for the last week. She increased her Torsemide to BID for the last five days. Pt reports that her swelling has not gone away but has improved. Current weights are : 6/11 96.6 lbs 6/12 97.4 lbs 6/13 96.2 lbs 6/14 97 lbs 6/15 97 lbs 6/16 97.8 lbs 6/17 96.2 lbs  Pt reports that she was seen by Dr. Sherene Sires today and sent for an x-ray. She denies CP and SOB at this time. Please advise.

## 2022-11-23 NOTE — Patient Instructions (Signed)
Ok to take an extra demadex if needed for excessive leg swelling but don't do it every day and follow up as soon as you can with the heart clinic which is advising you on this long term  My office will be contacting you by phone for referral for overnight pulse oximetry to be sure your oxygen level is ok at night  - if you don't hear back from my office within one week please call us back or notify us thru MyChart and we'll address it right away.   Please remember to go to the  x-ray department  @  [facility] for your tests - we will call you with the results when they are available     Please schedule a follow up visit in 3 months but call sooner if needed

## 2022-11-24 MED ORDER — DAPAGLIFLOZIN PROPANEDIOL 10 MG PO TABS: 10.0000 mg | ORAL_TABLET | Freq: Every day | ORAL | 11 refills | Status: DC

## 2022-11-24 NOTE — Telephone Encounter (Signed)
Returned call to pt to notify of Dr. Ival Bible response. No answer. Left msg to call back.

## 2022-11-24 NOTE — Telephone Encounter (Signed)
Pt notified of Dr. Ival Bible response . All orders placed.

## 2022-11-25 DIAGNOSIS — I272 Pulmonary hypertension, unspecified: Secondary | ICD-10-CM | POA: Diagnosis not present

## 2022-11-26 ENCOUNTER — Telehealth: Payer: Self-pay | Admitting: Cardiology

## 2022-11-26 ENCOUNTER — Telehealth: Payer: Self-pay | Admitting: Internal Medicine

## 2022-11-26 NOTE — Telephone Encounter (Signed)
Pt misunderstood and stopped taking her Torsemide. Pt will resume Torsemide as prescribed.

## 2022-11-26 NOTE — Telephone Encounter (Signed)
Pt called in stating she started [place] and she is concerned because she has gained a pound since yesterday. Please advise.   Yesterday: 96.6 Today: 97.6

## 2022-11-26 NOTE — Telephone Encounter (Signed)
Patient called to ask the nurse or doctor to call regarding her test results.  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

## 2022-11-26 NOTE — Telephone Encounter (Signed)
Spoke with patient. Advised radiologist hasnt read cxr yet, but someone would call her once those become available. She verbalized understanding. NFN

## 2022-12-09 ENCOUNTER — Other Ambulatory Visit (HOSPITAL_COMMUNITY): Payer: Self-pay | Admitting: Internal Medicine

## 2022-12-09 ENCOUNTER — Other Ambulatory Visit (HOSPITAL_COMMUNITY)
Admission: RE | Admit: 2022-12-09 | Discharge: 2022-12-09 | Disposition: A | Discharge status: Home / Self Care | Payer: Medicare Other | Source: Ambulatory Visit | Attending: Cardiology | Admitting: Cardiology

## 2022-12-09 DIAGNOSIS — I10 Essential (primary) hypertension: Secondary | ICD-10-CM | POA: Diagnosis not present

## 2022-12-09 DIAGNOSIS — I700 Atherosclerosis of aorta: Secondary | ICD-10-CM | POA: Diagnosis not present

## 2022-12-09 DIAGNOSIS — R109 Unspecified abdominal pain: Secondary | ICD-10-CM | POA: Diagnosis not present

## 2022-12-09 DIAGNOSIS — Z0000 Encounter for general adult medical examination without abnormal findings: Secondary | ICD-10-CM | POA: Diagnosis not present

## 2022-12-09 DIAGNOSIS — R634 Abnormal weight loss: Secondary | ICD-10-CM

## 2022-12-09 DIAGNOSIS — I5032 Chronic diastolic (congestive) heart failure: Secondary | ICD-10-CM | POA: Diagnosis not present

## 2022-12-09 DIAGNOSIS — R1030 Lower abdominal pain, unspecified: Secondary | ICD-10-CM

## 2022-12-09 DIAGNOSIS — Z299 Encounter for prophylactic measures, unspecified: Secondary | ICD-10-CM | POA: Diagnosis not present

## 2022-12-09 LAB — BASIC METABOLIC PANEL
Anion gap: 10 (ref 5–15)
BUN: 23 mg/dL (ref 8–23)
CO2: 25 mmol/L (ref 22–32)
Calcium: 9.1 mg/dL (ref 8.9–10.3)
Chloride: 94 mmol/L — ABNORMAL LOW (ref 98–111)
Creatinine, Ser: 0.94 mg/dL (ref 0.44–1.00)
GFR, Estimated: 59 mL/min — ABNORMAL LOW (ref >60)
Glucose, Bld: 90 mg/dL (ref 70–99)
Potassium: 3.5 mmol/L (ref 3.5–5.1)
Sodium: 129 mmol/L — ABNORMAL LOW (ref 135–145)

## 2022-12-15 ENCOUNTER — Telehealth: Payer: Self-pay | Admitting: Cardiology

## 2022-12-15 MED ORDER — DAPAGLIFLOZIN PROPANEDIOL 10 MG PO TABS: 10.0000 mg | ORAL_TABLET | Freq: Every day | ORAL | 3 refills | Status: DC

## 2022-12-15 NOTE — Telephone Encounter (Signed)
Patient is calling about her labs results, to see if the medication is working. Or to see if changes ned to be made. Please advise

## 2022-12-15 NOTE — Telephone Encounter (Signed)
Results reviewed.  Relatively stable hyponatremia (low sodium).  Potassium and renal function are normal.  Continue with current plan.   Pt notified of test results.  Pt request that a script be sent to [facility].

## 2022-12-18 ENCOUNTER — Ambulatory Visit (HOSPITAL_COMMUNITY)
Admission: RE | Admit: 2022-12-18 | Discharge: 2022-12-18 | Disposition: A | Discharge status: Home / Self Care | Payer: Medicare Other | Source: Ambulatory Visit | Attending: Nurse Practitioner | Admitting: Nurse Practitioner

## 2022-12-18 DIAGNOSIS — Z1231 Encounter for screening mammogram for malignant neoplasm of breast: Secondary | ICD-10-CM | POA: Diagnosis not present

## 2022-12-30 ENCOUNTER — Telehealth: Payer: Self-pay | Admitting: Cardiology

## 2022-12-30 NOTE — Telephone Encounter (Signed)
Advised that she does need to stay on farxiga to treat heart failure and fluid retention. Advised that patient assistance was available if she couldn't afford it. Agree to initiating patient assistance. Aware to bring back proof of income and application would be faxed afterwards. Verbalized understanding.  Note-Co-pay for farxiga at [facility] is $47/month.

## 2022-12-30 NOTE — Telephone Encounter (Signed)
Pt came into office wanting to speak with Shelly Fields concerning her [place]. Would like to know how it's going to help her heart because it's very expensive. Asked pt to have a seat in the lobby

## 2023-01-05 ENCOUNTER — Encounter: Payer: Self-pay | Admitting: *Deleted

## 2023-01-05 NOTE — Progress Notes (Signed)
Fax notification received from [employer] that farxiga 10 mg daily approved from 01/05/2023 through 06/08/2023.

## 2023-01-29 ENCOUNTER — Telehealth: Payer: Self-pay | Admitting: Cardiology

## 2023-01-29 NOTE — Telephone Encounter (Signed)
Left message to return call 

## 2023-01-29 NOTE — Telephone Encounter (Signed)
Pt c/o medication issue:  1. Name of Medication: Farxiga 10 mg   2. How are you currently taking this medication (dosage and times per day)? As written  3. Are you having a reaction (difficulty breathing--STAT)? No 4. What is your medication issue? Patient is calling because she thinks this medications is not working well with her body. Patient stated she has swelling her abdomin and she has not been able to sleep good at night. Please advise.

## 2023-01-29 NOTE — Telephone Encounter (Signed)
I spoke with patient and she relayed that her weight is between 97 and 98 lbs. She is currently taking torsemide 20 mg daily and wanted to confirm this is the dose she should be taking. She will continue to take farxiga and questioned why she was taking it because she read it was for diabetes and she doesn't have diabetes. I explained that we also use this medication to treat heart failure and she was ok with this. She also reports she cannot sleep and when I asked if she was using her Xanax, she said she had not in 2 days. I encouraged her to take as directed and to follow up with er pcp if her insomnia is unresolved.

## 2023-02-04 DIAGNOSIS — Z299 Encounter for prophylactic measures, unspecified: Secondary | ICD-10-CM | POA: Diagnosis not present

## 2023-02-04 DIAGNOSIS — I5032 Chronic diastolic (congestive) heart failure: Secondary | ICD-10-CM | POA: Diagnosis not present

## 2023-02-04 DIAGNOSIS — I10 Essential (primary) hypertension: Secondary | ICD-10-CM | POA: Diagnosis not present

## 2023-02-08 NOTE — Progress Notes (Signed)
History of Present Illness: Shelly Fields is a 88 y.o. year old female here for f/u of gross hematuria. She underwent cyst, hematuria CT in the Spring of this year. Only positive GU finding--small nonobstructing renal stones.  She is here today for check.  Her biggest concern is swelling over legs as well as her lower abdomen.  She wonders whether this could be related to her CHF.  She denies any hematuria since her last visit.  Past Medical History:  Diagnosis Date   Cor pulmonale (chronic) (HCC)    Diverticulosis    Essential hypertension    Hypothyroidism    Internal hemorrhoids    Persistent atrial fibrillation (HCC)    Pulmonary hypertension (HCC)    Seasonal allergies    Shingles     Past Surgical History:  Procedure Laterality Date   ABDOMINAL HYSTERECTOMY     CATARACT EXTRACTION     COLONOSCOPY N/A 01/12/2013   Procedure: COLONOSCOPY;  Surgeon: Malissa Hippo, MD;  Location: [facility];  Service: Endoscopy;  Laterality: N/A;  1030   COLONOSCOPY N/A 11/18/2017   Procedure: COLONOSCOPY;  Surgeon: Malissa Hippo, MD;  Location: [facility];  Service: Endoscopy;  Laterality: N/A;   POLYPECTOMY  11/18/2017   Procedure: POLYPECTOMY;  Surgeon: Malissa Hippo, MD;  Location: [facility];  Service: Endoscopy;;  colon    Home Medications:  (Not in a hospital admission)   Allergies: No Known Allergies  Family History  Problem Relation Age of Onset   Heart disease Mother    Hypertension Father    Colon cancer Neg Hx     Social History:  reports that she has never smoked. She has been exposed to tobacco smoke. She has never used smokeless tobacco. She reports that she does not drink alcohol and does not use drugs.  ROS: A complete review of systems was performed.  All systems are negative except for pertinent findings as noted.  Physical Exam:  Vital signs in last 24 hours: @VSRANGES @ General:  Alert and oriented, No acute distress HEENT: Normocephalic,  atraumatic Neck: No JVD or lymphadenopathy Cardiovascular: Regular rate  Lungs: Normal inspiratory/expiratory excursion Extremities: Significant lower extremity and lower abdominal pitting edema Neurologic: Grossly intact  I have reviewed prior pt notes  I have reviewed notes from previous physicians  I have reviewed urinalysis results--clear today  I have independently reviewed prior imaging--CT results reviewed  I have reviewed prior urine culture  Impression/Assessment:  History of microscopic hematuria, negative evaluation, urine clear today  Anasarca secondary to CHF  Plan:  1.  Double up furosemide today  2.  Follow-up with Dr. Sherril Croon scheduled tomorrow  3.  Return as needed  Chelsea Aus 02/08/2023, 3:53 PM  Bertram Millard. Fransisca Shawn MD

## 2023-02-09 ENCOUNTER — Encounter: Payer: Self-pay | Admitting: Urology

## 2023-02-09 ENCOUNTER — Ambulatory Visit: Payer: Medicare Other | Admitting: Urology

## 2023-02-09 VITALS — BP 159/73 | HR 81

## 2023-02-09 DIAGNOSIS — R310 Gross hematuria: Secondary | ICD-10-CM | POA: Diagnosis not present

## 2023-02-09 DIAGNOSIS — N200 Calculus of kidney: Secondary | ICD-10-CM

## 2023-02-09 LAB — URINALYSIS, ROUTINE W REFLEX MICROSCOPIC
Bilirubin, UA: NEGATIVE
Ketones, UA: NEGATIVE
Nitrite, UA: NEGATIVE
Protein,UA: NEGATIVE
RBC, UA: NEGATIVE
Specific Gravity, UA: 1.01 (ref 1.005–1.030)
Urobilinogen, Ur: 1 mg/dL (ref 0.2–1.0)
pH, UA: 6.5 (ref 5.0–7.5)

## 2023-02-09 LAB — MICROSCOPIC EXAMINATION: Bacteria, UA: NONE SEEN

## 2023-02-10 ENCOUNTER — Telehealth: Payer: Self-pay | Admitting: Cardiology

## 2023-02-10 DIAGNOSIS — Z299 Encounter for prophylactic measures, unspecified: Secondary | ICD-10-CM | POA: Diagnosis not present

## 2023-02-10 DIAGNOSIS — I10 Essential (primary) hypertension: Secondary | ICD-10-CM | POA: Diagnosis not present

## 2023-02-10 DIAGNOSIS — Z79899 Other long term (current) drug therapy: Secondary | ICD-10-CM | POA: Diagnosis not present

## 2023-02-10 DIAGNOSIS — R5383 Other fatigue: Secondary | ICD-10-CM | POA: Diagnosis not present

## 2023-02-10 DIAGNOSIS — I5032 Chronic diastolic (congestive) heart failure: Secondary | ICD-10-CM | POA: Diagnosis not present

## 2023-02-10 NOTE — Telephone Encounter (Signed)
Pt c/o swelling/edema: STAT if pt has developed SOB within 24 hours  If swelling, where is the swelling located?   Stomach, legs, and feet  How much weight have you gained and in what time span?  Gradually 1-2 lbs per week   Have you gained 2 pounds in a day or 5 pounds in a week?  no  Do you have a log of your daily weights (if so, list)?  yes  Are you currently taking a fluid pill?  yes  Are you currently SOB?  no  Have you traveled recently in a car or plane for an extended period of time? no   Pt went to see PCP today concerning this.

## 2023-02-11 NOTE — Telephone Encounter (Signed)
Patients weight today is  98.8 lbs  She is more concerned that she swelling in her abdomen/pelvic region  She does not have any appreciable swelling in her legs.  She saw Dr.Vyas yesterday and asked him about her swollen abdomen and he advised her drink a protein shake daily, and to call cardiology.  She denies constipation,dysuria or frequency.  She asked again should she stay on Farxiga and we discussed the cardiac use for drug. She then questioned if she needs on stay on Torsemide. I advised her to continue all medications

## 2023-02-11 NOTE — Telephone Encounter (Signed)
Patient is returning call. Transferred to Catherine, RN.  

## 2023-02-11 NOTE — Telephone Encounter (Signed)
Left message to return call 

## 2023-02-12 NOTE — Telephone Encounter (Signed)
I left patient message to increase torsemide to 40 mg (2 tablets) daily for the next 3 days ans call us on Monday with update.

## 2023-02-12 NOTE — Telephone Encounter (Signed)
Left a message for patient to call office back regarding provider recommendations.

## 2023-02-12 NOTE — Telephone Encounter (Signed)
Patient notified and verbalized understanding. Patient had no further questions at this time.  

## 2023-02-17 ENCOUNTER — Telehealth (INDEPENDENT_AMBULATORY_CARE_PROVIDER_SITE_OTHER): Payer: Self-pay | Admitting: Gastroenterology

## 2023-02-17 ENCOUNTER — Encounter (INDEPENDENT_AMBULATORY_CARE_PROVIDER_SITE_OTHER): Payer: Self-pay | Admitting: Gastroenterology

## 2023-02-17 ENCOUNTER — Ambulatory Visit (INDEPENDENT_AMBULATORY_CARE_PROVIDER_SITE_OTHER): Payer: Medicare Other | Admitting: Gastroenterology

## 2023-02-17 VITALS — BP 163/80 | HR 97

## 2023-02-17 DIAGNOSIS — K921 Melena: Secondary | ICD-10-CM | POA: Insufficient documentation

## 2023-02-17 DIAGNOSIS — K5904 Chronic idiopathic constipation: Secondary | ICD-10-CM | POA: Diagnosis not present

## 2023-02-17 DIAGNOSIS — D509 Iron deficiency anemia, unspecified: Secondary | ICD-10-CM | POA: Diagnosis not present

## 2023-02-17 DIAGNOSIS — R601 Generalized edema: Secondary | ICD-10-CM | POA: Diagnosis not present

## 2023-02-17 MED ORDER — POLYETHYLENE GLYCOL 3350 17 G PO PACK
17.0000 g | PACK | Freq: Two times a day (BID) | ORAL | 0 refills | Status: DC
Start: 2023-02-17 — End: 2023-03-25

## 2023-02-17 MED ORDER — PSYLLIUM 58.6 % PO PACK
1.0000 | PACK | Freq: Two times a day (BID) | ORAL | 2 refills | Status: DC
Start: 2023-02-17 — End: 2023-03-25

## 2023-02-17 NOTE — Telephone Encounter (Signed)
    02/17/23  Shelly Fields 10-12-1934  What type of surgery is being performed? EGD/Colonoscopy  When is surgery scheduled? TBD  What type of clearance is required (medical or pharmacy to hold medication or both? Medical and Cardiology  Are there any medications that need to be held prior to surgery and how long? Clearance to hold Xarelto for 2 days pror  Name of physician performing surgery?  Dr. Starleen Arms [facility] at [facility] Phone: [phone] Fax: [phone]  Anethesia type (none, local, MAC, general)? MAC

## 2023-02-17 NOTE — Patient Instructions (Signed)
It was very nice to meet you today, as dicussed with will plan for the following :  1) please follow-up with cardiology for worsening lower extremity edema(swelling)  2) abdominal ultrasound and blood work  3) schedule you for upper endoscopy and colonoscopy

## 2023-02-17 NOTE — Telephone Encounter (Signed)
I s/w the pt about clearance tele appt. Pt begins to tell me that her Hgb is 8 and Dr. Warner Mccreedy is wanting her appt with Dr. Diona Browner to be moved up sooner due to Hgb low. Pt also tells me that she s/w someone for Dr. Diona Browner and let them know that her abdomen is very bloated and it is generally not like this. I assured the pt tat I feel she probably should be seen in the office and that I will have Dr. Ival Bible nurse call her with a sooner appt.   Pt thanked me for the call and the help.   I will update all parties involved.

## 2023-02-17 NOTE — Telephone Encounter (Signed)
   Name: Shelly Fields  DOB: 07-Dec-1934  MRN: [id number]  Primary Cardiologist: Nona Dell, MD   Preoperative team, please contact this patient and set up a phone call appointment for further preoperative risk assessment. Please obtain consent and complete medication review. Thank you for your help.  I confirm that guidance regarding antiplatelet and oral anticoagulation therapy has been completed and, if necessary, noted below.  CHA2DS2-VASc Score = 5   This indicates a 7.2% annual risk of stroke. The patient's score is based upon: CHF History: 1 HTN History: 1 Diabetes History: 0 Stroke History: 0 Vascular Disease History: 0 Age Score: 2 Gender Score: 1     CrCl 31 Platelet count 309   Per office protocol, patient can hold Xarelto for 2 days prior to procedure.   Patient will not need bridging with Lovenox (enoxaparin) around procedure.   Ronney Asters, NP 02/17/2023, 3:03 PM [facility]

## 2023-02-17 NOTE — Progress Notes (Signed)
Vista Lawman , M.D. Gastroenterology & Hepatology [facility] [address] [place], [place] [address] Primary Care Physician: Wendall Papa [address] [place] [address]  Chief Complaint: Worsening anasarca, blood per rectum microcytic anemia  History of Present Illness: Shelly Fields is a 88 y.o. female with past medical history of cor pulmonale, HTN, hypothyroidism, A fib on DOAC, pulmonary HTN is here for evaluation of Worsening anasarca, blood per rectum , constipation and new microcytic anemia  Patient is accompanied by his son bedside.  Reports that for past couple weeks she has worsening lower extremity edema and feels fluid in the lower abdomen which is also swollen.  Patient continues to see fresh blood upon wiping, even though denies has stool scale Bristol stool scale 4 bowel movements daily and Metamucil once daily and Senokot.  Patient has a good appetite.  Previously had microscopic hematuria worked up by urology recent UA negative for hematuria  CT A/p in April which showed some stool burden     Last Colonoscopy:11/18/17 - Perianal skin tags found on perianal exam. - One small polyp in the proximal transverse colon. Biopsied-tubular adenoma  - Diverticulosis in the sigmoid colon. - Melanosis in the colon. - External and internal hemorrhoids.  tubular adenoma.   Last Endoscopy: never  FHx: neg for any gastrointestinal/liver disease, no malignancies Social: neg smoking, alcohol or illicit drug use Surgical: Hysterectomy  Past Medical History: Past Medical History:  Diagnosis Date   Cor pulmonale (chronic) (HCC)    Diverticulosis    Essential hypertension    Hypothyroidism    Internal hemorrhoids    Persistent atrial fibrillation (HCC)    Pulmonary hypertension (HCC)    Seasonal allergies    Shingles     Past Surgical History: Past Surgical History:  Procedure Laterality Date   ABDOMINAL  HYSTERECTOMY     CATARACT EXTRACTION     COLONOSCOPY N/A 01/12/2013   Procedure: COLONOSCOPY;  Surgeon: Malissa Hippo, MD;  [facility];  Service: Endoscopy;  Laterality: N/A;  1030   COLONOSCOPY N/A 11/18/2017   Procedure: COLONOSCOPY;  Surgeon: Malissa Hippo, MD;  [facility];  Service: Endoscopy;  Laterality: N/A;   POLYPECTOMY  11/18/2017   Procedure: POLYPECTOMY;  Surgeon: Malissa Hippo, MD;  [facility];  Service: Endoscopy;;  colon    Family History: Family History  Problem Relation Age of Onset   Heart disease Mother    Hypertension Father    Colon cancer Neg Hx     Social History: Social History   Tobacco Use  Smoking Status Never   Passive exposure: Past  Smokeless Tobacco Never   Social History   Substance and Sexual Activity  Alcohol Use No   Alcohol/week: 0.0 standard drinks of alcohol   Social History   Substance and Sexual Activity  Drug Use No    Allergies: No Known Allergies  Medications: Current Outpatient Medications  Medication Sig Dispense Refill   ALPRAZolam (XANAX) 0.25 MG tablet Take 0.25 mg by mouth daily as needed for sleep.     Carboxymethylcellulose Sodium (THERATEARS OP) Place 1 drop into both eyes daily.      dapagliflozin propanediol (FARXIGA) 10 MG TABS tablet Take 1 tablet (10 mg total) by mouth daily before breakfast. 90 tablet 3   denosumab (PROLIA) 60 MG/ML SOSY injection Inject 60 mg into the skin every 6 (six) months.     hydrocortisone (ANUSOL-HC) 2.5 %  rectal cream Place 1 Application rectally 3 (three) times daily. Apply three times per day x10 days then PRN thereafter 60 g 1   levothyroxine (SYNTHROID) 75 MCG tablet Take 75 mcg by mouth daily before breakfast.     metoprolol succinate (TOPROL-XL) 50 MG 24 hr tablet TAKE 1 AND 1/2 TABLETS BY MOUTH  TWICE DAILY 300 tablet 2   psyllium (METAMUCIL) 58.6 % packet Take 1 packet by mouth daily.     Rivaroxaban (XARELTO) 15 MG TABS tablet  TAKE 1 TABLET BY MOUTH DAILY  WITH SUPPER 100 tablet 1   torsemide (DEMADEX) 20 MG tablet TAKE 1 TABLET BY MOUTH DAILY 100 tablet 2   No current facility-administered medications for this visit.    Review of Systems: GENERAL: negative for malaise, night sweats HEENT: No changes in hearing or vision, no nose bleeds or other nasal problems. NECK: Negative for lumps, goiter, pain and significant neck swelling RESPIRATORY: Negative for cough, wheezing CARDIOVASCULAR: Negative for chest pain, leg swelling, palpitations, orthopnea GI: SEE HPI MUSCULOSKELETAL: Negative for joint pain or swelling, back pain, and muscle pain. SKIN: Negative for lesions, rash HEMATOLOGY Negative for prolonged bleeding, bruising easily, and swollen nodes. ENDOCRINE: Negative for cold or heat intolerance, polyuria, polydipsia and goiter. NEURO: negative for tremor, gait imbalance, syncope and seizures. The remainder of the review of systems is noncontributory.   Physical Exam: BP (!) 163/80 (BP Location: Left Arm, Patient Position: Sitting, Cuff Size: Normal)   Pulse 97  GENERAL: The patient is AO x3, in no acute distress. HEENT: Head is normocephalic and atraumatic. EOMI are intact. Mouth is well hydrated and without lesions. NECK: Supple. No masses LUNGS: Clear to auscultation. No presence of rhonchi/wheezing/rales. Adequate chest expansion HEART: RRR, normal s1 and s2. ABDOMEN: Soft, nontender, no guarding, no peritoneal signs, and nondistended. BS +. No masses. Lower abdomen pitting edema  EXTREMITIES: Without any cyanosis, clubbing, rash, lesions or 2++ pitting edema until the thigh NEUROLOGIC: AOx3, no focal motor deficit.    Imaging/Labs: as above     Latest Ref Rng & Units 01/20/2022    4:21 PM 05/17/2020   11:51 AM 05/10/2019   10:13 AM  CBC  WBC 4.0 - 10.5 K/uL 5.9  6.6  6.4   Hemoglobin 12.0 - 15.0 g/dL 16.1  09.6  04.5   Hematocrit 36.0 - 46.0 % 42.5  40.6  39.1   Platelets 150 - 400 K/uL  263  273  222    No results found for: "IRON", "TIBC", "FERRITIN"  I personally reviewed and interpreted the available labs, imaging and endoscopic files.  Impression and Plan: Shelly Fields is a 88 y.o. female with past medical history of cor pulmonale, HTN, hypothyroidism, A fib on DOAC, pulmonary HTN is here for evaluation of Worsening anasarca, blood per rectum , constipation and new microcytic anemia  #New onset Microcytic anemia  Hemoglobin went from 11.5-8.4 MCV 85--->77.  This is considered an alarm symptom and patient advanced age  Patient previously had microscopic hematuria which has resolved on recent evaluation by urology evident by negative UA.  Patient continues to become more anemic with fresh blood per rectum  Will send ferritin vitamin B12 and folate.   Microcytic anemia concerning for iron deficiency anemia bidirectional endoscopy is indicated to evaluate for upper GI and lower GI lesion  #Painless hematochezia  Patient had colonoscopy 5 years ago for small TA and internal/external hemorrhoids.  At that time hematochezia resolved but recently she started to again noticed  fresh blood upon wiping  Given recurrence of blood per rectum which is an alarm symptom and worsening anemia reasonable to proceed with colonoscopy  #Constipation Patient with significant stool burden on last CAT scan  Follow a high fiber diet: Include foods such as dates, prunes, pears, and kiwi. Take Miralax twice a day for the first week, then reduce to once daily thereafter. Use Metamucil twice a day.  #Worsening Anasarca  Patient with 2+ pitting edema and lower abdomen pitting edema about likely from right heart failure  Will get abdominal ultrasound Request cardiology follow-up and optimization prior to upper endoscopy and colonoscopy  All questions were answered.      Vista Lawman, MD Gastroenterology and Hepatology [facility]   This chart  has been completed using [facility] Dictation software, and while attempts have been made to ensure accuracy , certain words and phrases may not be transcribed as intended

## 2023-02-17 NOTE — Telephone Encounter (Signed)
Patient with diagnosis of atrial fibrillation on Xarelto for anticoagulation.    What type of surgery is being performed? EGD/Colonoscopy   When is surgery scheduled? TBD   CHA2DS2-VASc Score = 5   This indicates a 7.2% annual risk of stroke. The patient's score is based upon: CHF History: 1 HTN History: 1 Diabetes History: 0 Stroke History: 0 Vascular Disease History: 0 Age Score: 2 Gender Score: 1    CrCl 31 Platelet count 309  Per office protocol, patient can hold Xarelto for 2 days prior to procedure.   Patient will not need bridging with Lovenox (enoxaparin) around procedure.  **This guidance is not considered finalized until pre-operative APP has relayed final recommendations.**

## 2023-02-18 LAB — FERRITIN: Ferritin: 13 ng/mL — ABNORMAL LOW (ref 15–150)

## 2023-02-18 LAB — VITAMIN B12: Vitamin B-12: 1567 pg/mL — ABNORMAL HIGH (ref 232–1245)

## 2023-02-18 LAB — FOLATE: Folate: >20 ng/mL (ref >3.0)

## 2023-02-18 NOTE — Telephone Encounter (Signed)
Pt is scheduled to see Dr. Diona Browner, 02/24/23, clearance will be addressed at that time.

## 2023-02-18 NOTE — Progress Notes (Signed)
Hi Shelly Fields ,  Can you please call the patient and tell the patient the lab work shows she had iron deficiency anemia , and hence doing upper endoscopy and colonoscopy is indicated   Normal Vitmain b12 and folate levels   Thanks,  Vista Lawman, MD Gastroenterology and Hepatology [facility] Gastroenterology ==================  Ferritin : 13

## 2023-02-19 NOTE — Progress Notes (Unsigned)
Cardiology Office Note  Date: 02/24/2023   ID: KAAMILYA STCHARLES, DOB December 10, 1934, MRN [id number]  History of Present Illness: Shelly Fields is an 88 y.o. female last seen in April.  She is here today with her son for a follow-up visit.  Reports leg swelling and a feeling of abdominal bloating, fluctuating over the last several weeks.  Her weight is up by about 4 pounds in comparison to June on our scales.  We went over home blood pressure and weight checks as well.  She had recent lab work per PCP which is noted below.  She was placed on Farxiga in the interim with diagnosis of HFpEF and fluid retention.  Demadex has been 20 mg daily.  We discussed adjustments in diuretics.  She is also using an hose below the knees.  She is being evaluated by Dr. Tasia Catchings for investigation of microcytic anemia with plan for EGD and colonoscopy.  Abdominal ultrasound yesterday reported no acute abnormalities, right pleural effusion and possible small left lower pole kidney stone.  ECG today shows rate controlled atrial fibrillation with incomplete right bundle branch block, nonspecific ST changes.  Physical Exam: VS:  BP 130/74   Pulse (!) 56   Ht 5\' 2"  (1.575 m)   Wt 102 lb 3.2 oz (46.4 kg)   SpO2 99%   BMI 18.69 kg/m , BMI Body mass index is 18.69 kg/m.  Wt Readings from Last 3 Encounters:  02/24/23 102 lb 3.2 oz (46.4 kg)  11/23/22 98 lb (44.5 kg)  11/05/22 96 lb (43.5 kg)    General: Patient appears comfortable at rest. HEENT: Conjunctiva and lids normal. Neck: Supple, no elevated JVP or carotid bruits. Lungs: Clear to auscultation, nonlabored breathing at rest. Cardiac: Irregularly irregular, no S3, 2/6 systolic murmur. Abdomen: Protuberant but nontender, bowel sounds present. Extremities: 2+ pitting edema with venous stasis.  ECG:  An ECG dated 01/20/2022 was personally reviewed today and demonstrated:  Atrial fibrillation with rightward axis, IVCD, nonspecific ST  changes.  Labwork: 12/09/2022: BUN 23; Creatinine, Ser 0.94; Potassium 3.5; Sodium 129  September 2024: Hemoglobin 8.4, platelets 309, BUN 26, creatinine 1.02, potassium 3.7, TSH 5.87  Other Studies Reviewed Today:  Echocardiogram 02/25/2022:  1. Left ventricular ejection fraction, by estimation, is 60 to 65%. The  left ventricle has normal function. The left ventricle has no regional  wall motion abnormalities. Left ventricular diastolic parameters are  indeterminate. There is the  interventricular septum is flattened in systole and diastole, consistent  with right ventricular pressure and volume overload. The average left  ventricular global longitudinal strain is -19.8 %. The global longitudinal  strain is normal.   2. Right ventricular systolic function is normal. The right ventricular  size is severely enlarged. There is mildly elevated pulmonary artery  systolic pressure. The estimated right ventricular systolic pressure is  36.3 mmHg.   3. Left atrial size was moderately dilated.   4. Right atrial size was markedly dilated.   5. A small pericardial effusion is present. The pericardial effusion is  posterior to the left ventricle.   6. The mitral valve is abnormal. Mild mitral valve regurgitation.   7. Tricuspid valve regurgitation is severe.   8. The aortic valve is tricuspid. Aortic valve regurgitation is mild.  Aortic regurgitation PHT measures 628 msec.   9. The inferior vena cava is dilated in size with <50% respiratory  variability, suggesting right atrial pressure of 15 mmHg.   Assessment and Plan:  1.  Permanent  atrial fibrillation with CHA2DS2-VASc score of 4.  Heart rate controlled and asymptomatic in terms of palpitations on Toprol-XL.  She is also on Xarelto for stroke prophylaxis.  I reviewed her recent lab work.   2.  HFpEF with LVEF 60 to 65%, severely dilated RV with mildly elevated PASP 36 mmHg by echocardiogram in September 2023.  She uses compression  stockings and has been on Demadex along with [place] since last encounter.  Plan to double Demadex to 40 mg daily for the next few days and then adjust standing regiment to 20 mg alternating with 40 mg every other day.  Follow-up BMET in 2 weeks.  Could consider addition of Aldactone but will see how she does.   3.  Essential hypertension.  Blood pressure is adequately controlled today.  4.  Microcytic anemia with workup planned by GI, EGD and colonoscopy most likely.  Abdominal ultrasound did not show any acute findings.  I do not see a specific cardiac contraindication for her to proceed presuming fluid status continues to improve with medication adjustments as discussed above.  She would need to hold Xarelto 48 hours prior to endoscopy.  Disposition:  Follow up  1 month.  Signed, Jonelle Sidle, M.D., F.A.C.C. [facility]

## 2023-02-23 ENCOUNTER — Ambulatory Visit (HOSPITAL_COMMUNITY)
Admission: RE | Admit: 2023-02-23 | Discharge: 2023-02-23 | Disposition: A | Discharge status: Home / Self Care | Payer: Medicare Other | Source: Ambulatory Visit | Attending: Gastroenterology | Admitting: Gastroenterology

## 2023-02-23 ENCOUNTER — Ambulatory Visit: Payer: Medicare Other | Admitting: Internal Medicine

## 2023-02-23 DIAGNOSIS — K828 Other specified diseases of gallbladder: Secondary | ICD-10-CM | POA: Diagnosis not present

## 2023-02-23 DIAGNOSIS — R609 Edema, unspecified: Secondary | ICD-10-CM | POA: Diagnosis not present

## 2023-02-23 DIAGNOSIS — J90 Pleural effusion, not elsewhere classified: Secondary | ICD-10-CM | POA: Diagnosis not present

## 2023-02-23 DIAGNOSIS — R601 Generalized edema: Secondary | ICD-10-CM | POA: Diagnosis not present

## 2023-02-24 ENCOUNTER — Ambulatory Visit (HOSPITAL_COMMUNITY): Payer: Medicare Other

## 2023-02-24 ENCOUNTER — Ambulatory Visit: Payer: Medicare Other | Attending: Cardiology | Admitting: Cardiology

## 2023-02-24 ENCOUNTER — Encounter: Payer: Self-pay | Admitting: Cardiology

## 2023-02-24 VITALS — BP 130/74 | HR 56 | Ht 62.0 in | Wt 102.2 lb

## 2023-02-24 DIAGNOSIS — I5032 Chronic diastolic (congestive) heart failure: Secondary | ICD-10-CM

## 2023-02-24 DIAGNOSIS — I4821 Permanent atrial fibrillation: Secondary | ICD-10-CM | POA: Diagnosis not present

## 2023-02-24 DIAGNOSIS — I10 Essential (primary) hypertension: Secondary | ICD-10-CM | POA: Diagnosis not present

## 2023-02-24 MED ORDER — TORSEMIDE 20 MG PO TABS: ORAL_TABLET | ORAL | 3 refills | Status: DC

## 2023-02-24 NOTE — Patient Instructions (Signed)
Medication Instructions:  Take Torsemide 40 mg daily for 3 days THEN, take 20 mg one day,alternating with 40 mg the other day  Labwork: BMET in 2 weeks (03/10/23)  Testing/Procedures: None today  Follow-Up: 1 month  Any Other Special Instructions Will Be Listed Below (If Applicable).  If you need a refill on your cardiac medications before your next appointment, please call your pharmacy.

## 2023-02-25 ENCOUNTER — Telehealth (INDEPENDENT_AMBULATORY_CARE_PROVIDER_SITE_OTHER): Payer: Self-pay | Admitting: Gastroenterology

## 2023-02-25 MED ORDER — PEG 3350-KCL-NA BICARB-NACL 420 G PO SOLR: 4000.0000 mL | Freq: Once | ORAL | 0 refills | Status: DC

## 2023-02-25 NOTE — Progress Notes (Signed)
Pt contacted and scheduled-please see telephone call

## 2023-02-25 NOTE — Telephone Encounter (Signed)
Pt contacted and TCS/EGD scheduled for 03/25/23 at 11:00am. Instructions will be mailed once I receive pre op. Prep sent to pharmacy. No PA needed via insurance

## 2023-03-01 NOTE — Progress Notes (Signed)
Hi Wendy ,  Can you please call the patient and tell the patient the ultrasound shows normal liver , but shows kindey stone and fluid in right lung . I recommend patient continues to follow up with PCP and Cardiology   Thanks,  Vista Lawman, MD Gastroenterology and Hepatology [facility]astroenter[facility]

## 2023-03-02 ENCOUNTER — Telehealth (INDEPENDENT_AMBULATORY_CARE_PROVIDER_SITE_OTHER): Payer: Self-pay | Admitting: *Deleted

## 2023-03-02 ENCOUNTER — Telehealth: Payer: Self-pay | Admitting: Cardiology

## 2023-03-02 NOTE — Telephone Encounter (Signed)
Patient has f/u with [place] on 03/22/23  Abdominal ct should finding of right pleural effusion which Dr.McDowell already mentioned on 02/24/23 visit.   She was told to take extra torsemide 40 mg daily for 3 days and then increased dose to 40 mg daily alternating with 20 mg daily.

## 2023-03-02 NOTE — Telephone Encounter (Signed)
Patient would like her prep for colonscopy sent to [facility] and I call back after its sent so she will know. Kenney Houseman I can't tell if it was sent in or not because it goes under history sometimes on med list but I did not see it )   [facility].   [phone]

## 2023-03-02 NOTE — Telephone Encounter (Signed)
Pt left vm asking for a call back. Did not leave reason for the call. I tried to call her back and had to leave a message to return call   [phone]

## 2023-03-02 NOTE — Telephone Encounter (Signed)
Patient is calling because she had a Abdomen Complete Scan on 09/17. Patient states she was informed she had fluid in her lungs and was told to follow up with cardiology. Patient wanted to know if she should be concerned about the results. Please advise.

## 2023-03-03 NOTE — Telephone Encounter (Signed)
Patient wanted to know if she needed a driver on the day she has preop

## 2023-03-04 ENCOUNTER — Other Ambulatory Visit (INDEPENDENT_AMBULATORY_CARE_PROVIDER_SITE_OTHER): Payer: Self-pay

## 2023-03-04 MED ORDER — PEG 3350-KCL-NA BICARB-NACL 420 G PO SOLR: 4000.0000 mL | Freq: Once | ORAL | 0 refills | Status: AC

## 2023-03-04 NOTE — Telephone Encounter (Signed)
Prep sent to [facility]. Pt has been notified.

## 2023-03-10 ENCOUNTER — Telehealth: Payer: Self-pay | Admitting: *Deleted

## 2023-03-10 ENCOUNTER — Other Ambulatory Visit (HOSPITAL_COMMUNITY)
Admission: RE | Admit: 2023-03-10 | Discharge: 2023-03-10 | Disposition: A | Discharge status: Home / Self Care | Payer: Medicare Other | Source: Ambulatory Visit | Attending: Cardiology | Admitting: Cardiology

## 2023-03-10 DIAGNOSIS — I5032 Chronic diastolic (congestive) heart failure: Secondary | ICD-10-CM | POA: Insufficient documentation

## 2023-03-10 LAB — BASIC METABOLIC PANEL
Anion gap: 9 (ref 5–15)
BUN: 16 mg/dL (ref 8–23)
CO2: 29 mmol/L (ref 22–32)
Calcium: 9 mg/dL (ref 8.9–10.3)
Chloride: 91 mmol/L — ABNORMAL LOW (ref 98–111)
Creatinine, Ser: 0.9 mg/dL (ref 0.44–1.00)
GFR, Estimated: >60 mL/min (ref >60)
Glucose, Bld: 87 mg/dL (ref 70–99)
Potassium: 3 mmol/L — ABNORMAL LOW (ref 3.5–5.1)
Sodium: 129 mmol/L — ABNORMAL LOW (ref 135–145)

## 2023-03-10 MED ORDER — POTASSIUM CHLORIDE CRYS ER 20 MEQ PO TBCR: 20.0000 meq | EXTENDED_RELEASE_TABLET | Freq: Every day | ORAL | 1 refills | Status: DC

## 2023-03-10 NOTE — Telephone Encounter (Signed)
Patient informed and verbalized understanding of plan. Reports she has potassium 10 meq on hand. Advised to take (2) 10 meq of potassium BID for 2 days then decrease to (2) 10 meq tablets daily. New prescription sent to [facility] for potassium 20 meq daily. Copy sent to PCP

## 2023-03-10 NOTE — Telephone Encounter (Signed)
-----   Message from Nona Dell sent at 03/10/2023 11:17 AM EDT ----- Results reviewed.  Sodium is stable however potassium down to 3.0 with normal creatinine.  Diuretics were uptitrated recently.  Please start her on KCl 20 mEq twice daily for 2 days then switch to daily.

## 2023-03-18 NOTE — Progress Notes (Addendum)
Cardiology Office Note:  .   Date:  03/22/2023  ID:  Shelly Fields, DOB 21-May-1935, MRN [id number] PCP: [facility] Cardiologist:  Shelly Dell, MD    History of Present Illness: .   Shelly Fields is a 88 y.o. female with history of permanent Afib, HFpEF and HTN.   She saw Dr. Diona Fields 02/24/23 with increased fluid retention and abd CT showing Right pleural effusion. Shw as told to increase torsemide 40 mg daily for 3 days then 40 mg alternating with 20 mg daily. It was felt she could proceed with endoscopy if fluid retention resolved.  She comes in with 12 lb weight loss and no longer having leg and abdominal selling.  K 3.0 so Kdur increased. She feels weak and nervous today so she only took 1 torsemide this am instead of 2. She thinks her baseline weight should be around 95-97 lbs, down to 90 lbs today. Her HR was 100/m this   ROS:    Studies Reviewed: Marland Kitchen         Prior CV Studies:    Echo 02/2022 Study Result      ECHOCARDIOGRAM REPORT       Patient Name:   Shelly Fields [facility] Date of Exam: 02/25/2022  Medical Rec #:  [id number]      Height:       62.0 in  Accession #:    [id number]     Weight:       93.0 lb  Date of Birth:  21-Feb-1935     BSA:          1.381 m  Patient Age:    86 years       BP:           122/78 mmHg  Patient Gender: F              HR:           77 bpm.  Exam Location:  [place]   Procedure: 2D Echo, Cardiac Doppler, Color Doppler and Strain Analysis   Indications:    I50.32 (ICD-10-CM) - Chronic diastolic CHF (congestive  heart                 failure) (HCC)    History:        Patient has prior history of Echocardiogram examinations,  most                 recent 05/22/2020. CHF, Pulmonary HTN, Arrythmias:Atrial                  Fibrillation, Signs/Symptoms:Bilateral lower extremity  edema and                  Shortness of Breath; Risk Factors:Hypertension and  Non-Smoker.    Sonographer:   Shelly Fields RDMS,  RVT, RDCS  Referring Phys: [id number] Shelly Fields   IMPRESSIONS     1. Left ventricular ejection fraction, by estimation, is 60 to 65%. The  left ventricle has normal function. The left ventricle has no regional  wall motion abnormalities. Left ventricular diastolic parameters are  indeterminate. There is the  interventricular septum is flattened in systole and diastole, consistent  with right ventricular pressure and volume overload. The average left  ventricular global longitudinal strain is -19.8 %. The global longitudinal  strain is normal.   2. Right ventricular systolic function is normal. The right ventricular  size is severely enlarged. There is  mildly elevated pulmonary artery  systolic pressure. The estimated right ventricular systolic pressure is  36.3 mmHg.   3. Left atrial size was moderately dilated.   4. Right atrial size was markedly dilated.   5. A small pericardial effusion is present. The pericardial effusion is  posterior to the left ventricle.   6. The mitral valve is abnormal. Mild mitral valve regurgitation.   7. Tricuspid valve regurgitation is severe.   8. The aortic valve is tricuspid. Aortic valve regurgitation is mild.  Aortic regurgitation PHT measures 628 msec.   9. The inferior vena cava is dilated in size with <50% respiratory  variability, suggesting right atrial pressure of 15 mmHg.   Comparison(s): Prior images reviewed side by side. LVEF remains normal  range wtih LV septal flattening consistent with RV volume overload mainly.  There is severe RV enlargement and severe tricuspid regurgitation with  mildly estimated RVSP 36 mmHg.    Risk Assessment/Calculations:    CHA2DS2-VASc Score = 5   This indicates a 7.2% annual risk of stroke. The patient's score is based upon: CHF History: 1 HTN History: 1 Diabetes History: 0 Stroke History: 0 Vascular Disease History: 0 Age Score: 2 Gender Score: 1            Physical Exam:   VS:  BP 116/64    Pulse (!) 106   Ht 5\' 2"  (1.575 m)   Wt 90 lb (40.8 kg)   SpO2 100%   BMI 16.46 kg/m    Wt Readings from Last 3 Encounters:  03/22/23 90 lb (40.8 kg)  02/24/23 102 lb 3.2 oz (46.4 kg)  11/23/22 98 lb (44.5 kg)    GEN:  Thin, elderly, in no acute distress NECK: No JVD; No carotid bruits CARDIAC:  irreg irreg, 2/6 systolic murmur LSB RESPIRATORY:  Clear to auscultation without rales, wheezing or rhonchi  ABDOMEN: Soft, non-tender, non-distended EXTREMITIES:  No edema; No deformity   ASSESSMENT AND PLAN: .     Permanent atrial fibrillation with CHA2DS2-VASc score of 4.  Heart rate elevated 117/m today. Took her toprol 75 mg 1 1/2 hrs ago.She doesn't feel well and is very jittery. Doesn't want to come to ED. Will check bmet and CBC. Ha her take extra toprol 50 mg when she goes home. She has preop for colonoscopy tomorrow so will stop by our office for EKG and let us know how she's feeling.we may need to cancel her colonoscopy.   She is also on Xarelto for stroke prophylaxis.   -patient's labs came back and K 3.5, sodium 132, Hgb 10.2. continue with current plan.     HFpEF with LVEF 60 to 65%, severely dilated RV with mildly elevated PASP 36 mmHg by echocardiogram in September 2023.  She uses compression stockings and has been on Demadex   20 mg alternating with 40 mg every other day.  She has lost 12 lbs and appears to be over diuresed. Sodium was 129 and K 3.0. Check labs today and have a nurse visit tomorrow.    Essential hypertension.  Blood pressure is adequately controlled today.    Microcytic anemia with workup planned by GI, EGD and colonoscopy scheduled for thurs.  Abdominal ultrasound did not show any acute findings. She would need to hold Xarelto 48 hours prior to endoscopy.           Dispo: f/u with me in a month.  Signed, Jacolyn Reedy, PA-C

## 2023-03-22 ENCOUNTER — Other Ambulatory Visit (HOSPITAL_COMMUNITY)
Admission: RE | Admit: 2023-03-22 | Discharge: 2023-03-22 | Disposition: A | Discharge status: Home / Self Care | Payer: Medicare Other | Source: Ambulatory Visit | Attending: Physician Assistant | Admitting: Physician Assistant

## 2023-03-22 ENCOUNTER — Ambulatory Visit: Payer: Medicare Other | Attending: Physician Assistant | Admitting: Physician Assistant

## 2023-03-22 ENCOUNTER — Encounter: Payer: Self-pay | Admitting: Physician Assistant

## 2023-03-22 VITALS — BP 116/64 | HR 106 | Ht 62.0 in | Wt 90.0 lb

## 2023-03-22 DIAGNOSIS — I5032 Chronic diastolic (congestive) heart failure: Secondary | ICD-10-CM | POA: Diagnosis not present

## 2023-03-22 DIAGNOSIS — I10 Essential (primary) hypertension: Secondary | ICD-10-CM

## 2023-03-22 DIAGNOSIS — R000 Tachycardia, unspecified: Secondary | ICD-10-CM | POA: Diagnosis not present

## 2023-03-22 DIAGNOSIS — I4821 Permanent atrial fibrillation: Secondary | ICD-10-CM | POA: Diagnosis not present

## 2023-03-22 DIAGNOSIS — D509 Iron deficiency anemia, unspecified: Secondary | ICD-10-CM | POA: Insufficient documentation

## 2023-03-22 LAB — BASIC METABOLIC PANEL
Anion gap: 9 (ref 5–15)
BUN: 24 mg/dL — ABNORMAL HIGH (ref 8–23)
CO2: 28 mmol/L (ref 22–32)
Calcium: 9.5 mg/dL (ref 8.9–10.3)
Chloride: 95 mmol/L — ABNORMAL LOW (ref 98–111)
Creatinine, Ser: 0.94 mg/dL (ref 0.44–1.00)
GFR, Estimated: 59 mL/min — ABNORMAL LOW (ref >60)
Glucose, Bld: 112 mg/dL — ABNORMAL HIGH (ref 70–99)
Potassium: 3.5 mmol/L (ref 3.5–5.1)
Sodium: 132 mmol/L — ABNORMAL LOW (ref 135–145)

## 2023-03-22 LAB — CBC
HCT: 33.8 % — ABNORMAL LOW (ref 36.0–46.0)
Hemoglobin: 10.2 g/dL — ABNORMAL LOW (ref 12.0–15.0)
MCH: 25.4 pg — ABNORMAL LOW (ref 26.0–34.0)
MCHC: 30.2 g/dL (ref 30.0–36.0)
MCV: 84.3 fL (ref 80.0–100.0)
Platelets: 301 10*3/uL (ref 150–400)
RBC: 4.01 MIL/uL (ref 3.87–5.11)
RDW: 23.8 % — ABNORMAL HIGH (ref 11.5–15.5)
WBC: 5.9 10*3/uL (ref 4.0–10.5)
nRBC: 0 % (ref 0.0–0.2)

## 2023-03-22 MED ORDER — TORSEMIDE 20 MG PO TABS: 20.0000 mg | ORAL_TABLET | Freq: Every day | ORAL | 2 refills | Status: DC

## 2023-03-22 NOTE — Patient Instructions (Addendum)
Medication Instructions:   WHEN you get home TODAY, take an extra Toprol 50 mg ,tonight take your evening dose of Toprol 75 mg also.   DO NOT Take Torsemide Tomorrow, 03/23/23  STARTING Wednesday, 03/24/23 DECREASE Torsemide to 20 mg daily  Labwork: CBC,BMET today  Testing/Procedures: None today  Follow-Up:   Nurse appointment tomorrow at 12 pm for EKG/VS  1 month  Any Other Special Instructions Will Be Listed Below (If Applicable).  If you need a refill on your cardiac medications before your next appointment, please call your pharmacy.

## 2023-03-23 ENCOUNTER — Ambulatory Visit: Payer: Medicare Other | Attending: Cardiology | Admitting: *Deleted

## 2023-03-23 ENCOUNTER — Encounter (HOSPITAL_COMMUNITY)
Admission: RE | Admit: 2023-03-23 | Discharge: 2023-03-23 | Disposition: A | Discharge status: Home / Self Care | Payer: Medicare Other | Source: Ambulatory Visit | Attending: Gastroenterology | Admitting: Gastroenterology

## 2023-03-23 ENCOUNTER — Ambulatory Visit: Payer: Medicare Other | Admitting: Cardiology

## 2023-03-23 ENCOUNTER — Encounter (HOSPITAL_COMMUNITY): Payer: Self-pay

## 2023-03-23 VITALS — BP 120/80 | HR 60

## 2023-03-23 DIAGNOSIS — I480 Paroxysmal atrial fibrillation: Secondary | ICD-10-CM

## 2023-03-23 NOTE — Patient Instructions (Signed)
Shelly Fields  03/23/2023     @PREFPERIOPPHARMACY @   Your procedure is scheduled on 03/25/2023.  Report to [facility] at 8:30 A.M.  Call this number if you have problems the morning of surgery:  [phone]  If you experience any cold or flu symptoms such as cough, fever, chills, shortness of breath, etc. between now and your scheduled surgery, please notify us at the above number.   Remember:   Please follow the diet and prep instructions given to you by Dr Marcelino [facility] office.    Last dose of [name] should be 03/21/2023   Last dose of Xarelto should be 03/22/2023     Take these medicines the morning of surgery with A SIP OF WATER : Levothyroxine and Metoprolol    Do not wear jewelry, make-up or nail polish, including gel polish,  artificial nails, or any other type of covering on natural nails (fingers and  toes).  Do not wear lotions, powders, or perfumes, or deodorant.  Do not shave 48 hours prior to surgery.  Men may shave face and neck.  Do not bring valuables to the hospital.  [facility] is not responsible for any belongings or valuables.  Contacts, dentures or bridgework may not be worn into surgery.  Leave your suitcase in the car.  After surgery it may be brought to your room.  For patients admitted to the hospital, discharge time will be determined by your treatment team.  Patients discharged the day of surgery will not be allowed to drive home.   Name and phone number of your driver:   Family Special instructions:  N/A  Please read over the following fact sheets that you were given. Care and Recovery After Surgery  Upper Endoscopy, Adult Upper endoscopy is a procedure to look inside the upper GI (gastrointestinal) tract. The upper GI tract is made up of: The esophagus. This is the part of the body that moves food from your mouth to your stomach. The stomach. The duodenum. This is the first part of your small intestine. This procedure is also called  esophagogastroduodenoscopy (EGD) or gastroscopy. In this procedure, your health care provider passes a thin, flexible tube (endoscope) through your mouth and down your esophagus into your stomach and into your duodenum. A small camera is attached to the end of the tube. Images from the camera appear on a monitor in the exam room. During this procedure, your health care provider may also remove a small piece of tissue to be sent to a lab and examined under a microscope (biopsy). Your health care provider may do an upper endoscopy to diagnose cancers of the upper GI tract. You may also have this procedure to find the cause of other conditions, such as: Stomach pain. Heartburn. Pain or problems when swallowing. Nausea and vomiting. Stomach bleeding. Stomach ulcers. Tell a health care provider about: Any allergies you have. All medicines you are taking, including vitamins, herbs, eye drops, creams, and over-the-counter medicines. Any problems you or family members have had with anesthetic medicines. Any bleeding problems you have. Any surgeries you have had. Any medical conditions you have. Whether you are pregnant or may be pregnant. What are the risks? Your healthcare provider will talk with you about risks. These may include: Infection. Bleeding. Allergic reactions to medicines. A tear or hole (perforation) in the esophagus, stomach, or duodenum. What happens before the procedure? When to stop eating and drinking Follow instructions from your health care provider about what you may  eat and drink. These may include: 8 hours before your procedure Stop eating most foods. Do not eat meat, fried foods, or fatty foods. Eat only light foods, such as toast or crackers. All liquids are okay except energy drinks and alcohol. 6 hours before your procedure Stop eating. Drink only clear liquids, such as water, clear fruit juice, black coffee, plain tea, and sports drinks. Do not drink energy drinks  or alcohol. 2 hours before your procedure Stop drinking all liquids. You may be allowed to take medicines with small sips of water. If you do not follow your health care provider's instructions, your procedure may be delayed or canceled. Medicines Ask your health care provider about: Changing or stopping your regular medicines. This is especially important if you are taking diabetes medicines or blood thinners. Taking medicines such as aspirin and ibuprofen. These medicines can thin your blood. Do not take these medicines unless your health care provider tells you to take them. Taking over-the-counter medicines, vitamins, herbs, and supplements. General instructions If you will be going home right after the procedure, plan to have a responsible adult: Take you home from the hospital or clinic. You will not be allowed to drive. Care for you for the time you are told. What happens during the procedure?  An IV will be inserted into one of your veins. You may be given one or more of the following: A medicine to help you relax (sedative). A medicine to numb the throat (local anesthetic). You will lie on your left side on an exam table. Your health care provider will pass the endoscope through your mouth and down your esophagus. Your health care provider will use the scope to check the inside of your esophagus, stomach, and duodenum. Biopsies may be taken. The endoscope will be removed. The procedure may vary among health care providers and hospitals. What happens after the procedure? Your blood pressure, heart rate, breathing rate, and blood oxygen level will be monitored until you leave the hospital or clinic. When your throat is no longer numb, you may be given some fluids to drink. If you were given a sedative during the procedure, it can affect you for several hours. Do not drive or operate machinery until your health care provider says that it is safe. It is up to you to get the results  of your procedure. Ask your health care provider, or the department that is doing the procedure, when your results will be ready. Contact a health care provider if you: Have a sore throat that lasts longer than 1 day. Have a fever. Get help right away if you: Vomit blood or your vomit looks like coffee grounds. Have bloody, black, or tarry stools. Have a very bad sore throat or you cannot swallow. Have difficulty breathing or very bad pain in your chest or abdomen. These symptoms may be an emergency. Get help right away. Call 911. Do not wait to see if the symptoms will go away. Do not drive yourself to the hospital. Summary Upper endoscopy is a procedure to look inside the upper GI tract. During the procedure, an IV will be inserted into one of your veins. You may be given a medicine to help you relax. The endoscope will be passed through your mouth and down your esophagus. Follow instructions from your health care provider about what you can eat and drink. This information is not intended to replace advice given to you by your health care provider. Make sure you discuss any  questions you have with your health care provider. Document Revised: 09/03/2021 Document Reviewed: 09/03/2021 Elsevier Patient Education  2024 [employer]  Colonoscopy, Adult A colonoscopy is a procedure to look at the entire large intestine. This procedure is done using a long, thin, flexible tube that has a camera on the end. You may have a colonoscopy: As a part of normal colorectal screening. If you have certain symptoms, such as: A low number of red blood cells in your blood (anemia). Diarrhea that does not go away. Pain in your abdomen. Blood in your stool. A colonoscopy can help screen for and diagnose medical problems, including: An abnormal growth of cells or tissue (tumor). Abnormal growths within the lining of your intestine (polyps). Inflammation. Areas of bleeding. Tell your health care  provider about: Any allergies you have. All medicines you are taking, including vitamins, herbs, eye drops, creams, and over-the-counter medicines. Any problems you or family members have had with anesthetic medicines. Any bleeding problems you have. Any surgeries you have had. Any medical conditions you have. Any problems you have had with having bowel movements. Whether you are pregnant or may be pregnant. What are the risks? Generally, this is a safe procedure. However, problems may occur, including: Bleeding. Damage to your intestine. Allergic reactions to medicines given during the procedure. Infection. This is rare. What happens before the procedure? Eating and drinking restrictions Follow instructions from your health care provider about eating or drinking restrictions, which may include: A few days before the procedure: Follow a low-fiber diet. Avoid nuts, seeds, dried fruit, raw fruits, and vegetables. 1-3 days before the procedure: Eat only gelatin dessert or ice pops. Drink only clear liquids, such as water, clear juice, clear broth or bouillon, black coffee or tea, or clear soft drinks or sports drinks. Avoid liquids that contain red or purple dye. The day of the procedure: Do not eat solid foods. You may continue to drink clear liquids until up to 2 hours before the procedure. Do not eat or drink anything starting 2 hours before the procedure, or within the time period that your health care provider recommends. Bowel prep If you were prescribed a bowel prep to take by mouth (orally) to clean out your colon: Take it as told by your health care provider. Starting the day before your procedure, you will need to drink a large amount of liquid medicine. The liquid will cause you to have many bowel movements of loose stool until your stool becomes almost clear or light green. If your skin or the opening between the buttocks (anus) gets irritated from diarrhea, you may relieve  the irritation using: Wipes with medicine in them, such as adult wet wipes with aloe and vitamin E. A product to soothe skin, such as petroleum jelly. If you vomit while drinking the bowel prep: Take a break for up to 60 minutes. Begin the bowel prep again. Call your health care provider if you keep vomiting or you cannot take the bowel prep without vomiting. To clean out your colon, you may also be given: Laxative medicines. These help you have a bowel movement. Instructions for enema use. An enema is liquid medicine injected into your rectum. Medicines Ask your health care provider about: Changing or stopping your regular medicines or supplements. This is especially important if you are taking iron supplements, diabetes medicines, or blood thinners. Taking medicines such as aspirin and ibuprofen. These medicines can thin your blood. Do not take these medicines unless your health care provider  tells you to take them. Taking over-the-counter medicines, vitamins, herbs, and supplements. General instructions Ask your health care provider what steps will be taken to help prevent infection. These may include washing skin with a germ-killing soap. If you will be going home right after the procedure, plan to have a responsible adult: Take you home from the hospital or clinic. You will not be allowed to drive. Care for you for the time you are told. What happens during the procedure?  An IV will be inserted into one of your veins. You will be given a medicine to make you fall asleep (general anesthetic). You will lie on your side with your knees bent. A lubricant will be put on the tube. Then the tube will be: Inserted into your anus. Gently eased through all parts of your large intestine. Air will be sent into your colon to keep it open. This may cause some pressure or cramping. Images will be taken with the camera and will appear on a screen. A small tissue sample may be removed to be looked  at under a microscope (biopsy). The tissue may be sent to a lab for testing if any signs of problems are found. If small polyps are found, they may be removed and checked for cancer cells. When the procedure is finished, the tube will be removed. The procedure may vary among health care providers and hospitals. What happens after the procedure? Your blood pressure, heart rate, breathing rate, and blood oxygen level will be monitored until you leave the hospital or clinic. You may have a small amount of blood in your stool. You may pass gas and have mild cramping or bloating in your abdomen. This is caused by the air that was used to open your colon during the exam. If you were given a sedative during the procedure, it can affect you for several hours. Do not drive or operate machinery until your health care provider says that it is safe. It is up to you to get the results of your procedure. Ask your health care provider, or the department that is doing the procedure, when your results will be ready. Summary A colonoscopy is a procedure to look at the entire large intestine. Follow instructions from your health care provider about eating and drinking before the procedure. If you were prescribed an oral bowel prep to clean out your colon, take it as told by your health care provider. During the colonoscopy, a flexible tube with a camera on its end is inserted into the anus and then passed into all parts of the large intestine. This information is not intended to replace advice given to you by your health care provider. Make sure you discuss any questions you have with your health care provider. Document Revised: 07/07/2022 Document Reviewed: 01/15/2021 Elsevier Patient Education  2024 [employer]  Monitored Anesthesia Care Anesthesia refers to the techniques, procedures, and medicines that help a person stay safe and comfortable during surgery. Monitored anesthesia care, or sedation, is one type  of anesthesia. You may have sedation if you do not need to be asleep for your procedure. Procedures that use sedation may include: Surgery to remove cataracts from your eyes. A dental procedure. A biopsy. This is when a tissue sample is removed and looked at under a microscope. You will be watched closely during your procedure. Your level of sedation or type of anesthesia may be changed to fit your needs. Tell a health care provider about: Any allergies you have.  All medicines you are taking, including vitamins, herbs, eye drops, creams, and over-the-counter medicines. Any problems you or family members have had with anesthesia. Any bleeding problems you have. Any surgeries you have had. Any medical conditions or illnesses you have. This includes sleep apnea, cough, fever, or the flu. Whether you are pregnant or may be pregnant. Whether you use cigarettes, alcohol, or drugs. Any use of steroids, whether by mouth or as a cream. What are the risks? Your health care provider will talk with you about risks. These may include: Getting too much medicine (oversedation). Nausea. Allergic reactions to medicines. Trouble breathing. If this happens, a breathing tube may be used to help you breathe. It will be removed when you are awake and breathing on your own. Heart trouble. Lung trouble. Confusion that gets better with time (emergence delirium). What happens before the procedure? When to stop eating and drinking Follow instructions from your health care provider about what you may eat and drink. These may include: 8 hours before your procedure Stop eating most foods. Do not eat meat, fried foods, or fatty foods. Eat only light foods, such as toast or crackers. All liquids are okay except energy drinks and alcohol. 6 hours before your procedure Stop eating. Drink only clear liquids, such as water, clear fruit juice, black coffee, plain tea, and sports drinks. Do not drink energy drinks or  alcohol. 2 hours before your procedure Stop drinking all liquids. You may be allowed to take medicines with small sips of water. If you do not follow your health care provider's instructions, your procedure may be delayed or canceled. Medicines Ask your health care provider about: Changing or stopping your regular medicines. These include any diabetes medicines or blood thinners you take. Taking medicines such as aspirin and ibuprofen. These medicines can thin your blood. Do not take them unless your health care provider tells you to. Taking over-the-counter medicines, vitamins, herbs, and supplements. Testing You may have an exam or testing. You may have a blood or urine sample taken. General instructions Do not use any products that contain nicotine or tobacco for at least 4 weeks before the procedure. These products include cigarettes, chewing tobacco, and vaping devices, such as e-cigarettes. If you need help quitting, ask your health care provider. If you will be going home right after the procedure, plan to have a responsible adult: Take you home from the hospital or clinic. You will not be allowed to drive. Care for you for the time you are told. What happens during the procedure?  Your blood pressure, heart rate, breathing, level of pain, and blood oxygen level will be monitored. An IV will be inserted into one of your veins. You may be given: A sedative. This helps you relax. Anesthesia. This will: Numb certain areas of your body. Make you fall asleep for surgery. You will be given medicines as needed to keep you comfortable. The more medicine you are given, the deeper your level of sedation will be. Your level of sedation may be changed to fit your needs. There are three levels of sedation: Mild sedation. At this level, you may feel awake and relaxed. You will be able to follow directions. Moderate sedation. At this level, you will be sleepy. You may not remember the  procedure. Deep sedation. At this level, you will be asleep. You will not remember the procedure. How you get the medicines will depend on your age and the procedure. They may be given as: A pill. This  may be taken by mouth (orally) or inserted into the rectum. An injection. This may be into a vein or muscle. A spray through the nose. After your procedure is over, the medicine will be stopped. The procedure may vary among health care providers and hospitals. What happens after the procedure? Your blood pressure, heart rate, breathing rate, and blood oxygen level will be monitored until you leave the hospital or clinic. You may feel sleepy, clumsy, or nauseous. You may not remember what happened during or after the procedure. Sedation can affect you for several hours. Do not drive or use machinery until your health care provider says that it is safe. This information is not intended to replace advice given to you by your health care provider. Make sure you discuss any questions you have with your health care provider. Document Revised: 10/20/2021 Document Reviewed: 10/20/2021 Elsevier Patient Education  2024 [employer].

## 2023-03-23 NOTE — Patient Instructions (Signed)
Medication Instructions:  Your physician recommends that you continue on your current medications as directed. Please refer to the Current Medication list given to you today.   Labwork: NONE   Testing/Procedures: NONE   Follow-Up: As scheduled   Any Other Special Instructions Will Be Listed Below (If Applicable).     If you need a refill on your cardiac medications before your next appointment, please call your pharmacy.

## 2023-03-25 ENCOUNTER — Encounter (INDEPENDENT_AMBULATORY_CARE_PROVIDER_SITE_OTHER): Payer: Self-pay | Admitting: *Deleted

## 2023-03-25 ENCOUNTER — Ambulatory Visit (HOSPITAL_COMMUNITY)
Admission: RE | Admit: 2023-03-25 | Discharge: 2023-03-25 | Disposition: A | Discharge status: Home / Self Care | Payer: Medicare Other | Attending: Gastroenterology | Admitting: Gastroenterology

## 2023-03-25 ENCOUNTER — Encounter (HOSPITAL_COMMUNITY): Payer: Self-pay

## 2023-03-25 ENCOUNTER — Encounter (HOSPITAL_COMMUNITY)
Admission: RE | Disposition: A | Discharge status: Home / Self Care | Payer: Self-pay | Source: Home / Self Care | Attending: Gastroenterology

## 2023-03-25 ENCOUNTER — Ambulatory Visit (HOSPITAL_BASED_OUTPATIENT_CLINIC_OR_DEPARTMENT_OTHER): Payer: Medicare Other | Admitting: Anesthesiology

## 2023-03-25 ENCOUNTER — Ambulatory Visit (HOSPITAL_COMMUNITY): Payer: Medicare Other | Admitting: Anesthesiology

## 2023-03-25 DIAGNOSIS — K5730 Diverticulosis of large intestine without perforation or abscess without bleeding: Secondary | ICD-10-CM | POA: Diagnosis not present

## 2023-03-25 DIAGNOSIS — E039 Hypothyroidism, unspecified: Secondary | ICD-10-CM | POA: Insufficient documentation

## 2023-03-25 DIAGNOSIS — Z79890 Hormone replacement therapy (postmenopausal): Secondary | ICD-10-CM | POA: Insufficient documentation

## 2023-03-25 DIAGNOSIS — K644 Residual hemorrhoidal skin tags: Secondary | ICD-10-CM | POA: Insufficient documentation

## 2023-03-25 DIAGNOSIS — I2781 Cor pulmonale (chronic): Secondary | ICD-10-CM | POA: Diagnosis not present

## 2023-03-25 DIAGNOSIS — K449 Diaphragmatic hernia without obstruction or gangrene: Secondary | ICD-10-CM

## 2023-03-25 DIAGNOSIS — I10 Essential (primary) hypertension: Secondary | ICD-10-CM | POA: Diagnosis not present

## 2023-03-25 DIAGNOSIS — D509 Iron deficiency anemia, unspecified: Secondary | ICD-10-CM

## 2023-03-25 DIAGNOSIS — K5904 Chronic idiopathic constipation: Secondary | ICD-10-CM

## 2023-03-25 DIAGNOSIS — K2950 Unspecified chronic gastritis without bleeding: Secondary | ICD-10-CM

## 2023-03-25 DIAGNOSIS — Z7901 Long term (current) use of anticoagulants: Secondary | ICD-10-CM | POA: Insufficient documentation

## 2023-03-25 DIAGNOSIS — I272 Pulmonary hypertension, unspecified: Secondary | ICD-10-CM | POA: Diagnosis not present

## 2023-03-25 DIAGNOSIS — A048 Other specified bacterial intestinal infections: Secondary | ICD-10-CM

## 2023-03-25 DIAGNOSIS — K921 Melena: Secondary | ICD-10-CM | POA: Diagnosis not present

## 2023-03-25 DIAGNOSIS — I4819 Other persistent atrial fibrillation: Secondary | ICD-10-CM | POA: Insufficient documentation

## 2023-03-25 DIAGNOSIS — K2970 Gastritis, unspecified, without bleeding: Secondary | ICD-10-CM | POA: Insufficient documentation

## 2023-03-25 DIAGNOSIS — K642 Third degree hemorrhoids: Secondary | ICD-10-CM

## 2023-03-25 DIAGNOSIS — I2729 Other secondary pulmonary hypertension: Secondary | ICD-10-CM | POA: Insufficient documentation

## 2023-03-25 DIAGNOSIS — K648 Other hemorrhoids: Secondary | ICD-10-CM | POA: Insufficient documentation

## 2023-03-25 DIAGNOSIS — Z7984 Long term (current) use of oral hypoglycemic drugs: Secondary | ICD-10-CM | POA: Diagnosis not present

## 2023-03-25 HISTORY — PX: ESOPHAGOGASTRODUODENOSCOPY (EGD) WITH PROPOFOL: SHX5813

## 2023-03-25 HISTORY — PX: BIOPSY: SHX5522

## 2023-03-25 HISTORY — PX: COLONOSCOPY WITH PROPOFOL: SHX5780

## 2023-03-25 LAB — HM COLONOSCOPY

## 2023-03-25 SURGERY — COLONOSCOPY WITH PROPOFOL
Anesthesia: General

## 2023-03-25 MED ORDER — PHENYLEPHRINE-MINERAL OIL-PET 0.25-14-74.9 % RE OINT: 1.0000 | TOPICAL_OINTMENT | Freq: Two times a day (BID) | RECTAL | 0 refills | Status: AC | PRN

## 2023-03-25 MED ORDER — POLYETHYLENE GLYCOL 3350 17 G PO PACK
17.0000 g | PACK | Freq: Every day | ORAL | 0 refills | Status: AC
Start: 2023-03-25 — End: 2023-06-23

## 2023-03-25 MED ORDER — PHENYLEPHRINE-MINERAL OIL-PET 0.25-14-74.9 % RE OINT: 1.0000 | TOPICAL_OINTMENT | Freq: Two times a day (BID) | RECTAL | 0 refills | Status: DC | PRN

## 2023-03-25 MED ORDER — PSYLLIUM 58.6 % PO PACK: 1.0000 | PACK | Freq: Two times a day (BID) | ORAL | 12 refills | Status: AC

## 2023-03-25 MED ORDER — HYDROCORTISONE ACETATE 25 MG RE SUPP: 25.0000 mg | Freq: Two times a day (BID) | RECTAL | 0 refills | Status: DC

## 2023-03-25 MED ORDER — PROPOFOL 10 MG/ML IV BOLUS
INTRAVENOUS | Status: DC | PRN
  Administered 2023-03-25: 120 mg via INTRAVENOUS
  Administered 2023-03-25: 30 mg via INTRAVENOUS

## 2023-03-25 MED ORDER — HYDROCORTISONE ACETATE 25 MG RE SUPP
25.0000 mg | Freq: Two times a day (BID) | RECTAL | 0 refills | Status: AC
Start: 2023-03-25 — End: 2023-03-31

## 2023-03-25 MED ORDER — LIDOCAINE HCL (CARDIAC) PF 100 MG/5ML IV SOSY
PREFILLED_SYRINGE | INTRAVENOUS | Status: DC | PRN
  Administered 2023-03-25: 50 mg via INTRAVENOUS

## 2023-03-25 MED ORDER — PROPOFOL 500 MG/50ML IV EMUL
INTRAVENOUS | Status: DC | PRN
  Administered 2023-03-25: 100 ug/kg/min via INTRAVENOUS

## 2023-03-25 MED ORDER — FERROUS GLUCONATE 324 (38 FE) MG PO TABS
324.0000 mg | ORAL_TABLET | ORAL | Status: DC
Start: 2023-03-25 — End: 2023-11-01

## 2023-03-25 MED ORDER — LACTATED RINGERS IV SOLN: INTRAVENOUS | Status: DC

## 2023-03-25 MED ORDER — FERROUS GLUCONATE 324 (38 FE) MG PO TABS
324.0000 mg | ORAL_TABLET | ORAL | Status: DC
Start: 2023-03-25 — End: 2023-03-25

## 2023-03-25 NOTE — Op Note (Signed)
[facility] Patient Name: Shelly Fields Procedure Date: 03/25/2023 10:07 AM MRN: [id number] Date of Birth: Aug 31, 1934 Attending MD: Sanjuan Dame , MD, [id number] CSN: [id number] Age: 88 Admit Type: Outpatient Procedure:                Colonoscopy Indications:              Hematochezia, Iron deficiency anemia Providers:                Sanjuan Dame, MD, Sheran Fava, Francoise Ceo                            RN, RN, Cyril Mourning, Technician Referring MD:              Medicines:                Monitored Anesthesia Care Complications:            No immediate complications. Estimated Blood Loss:     Estimated blood loss: none. Procedure:                Pre-Anesthesia Assessment:                           - Prior to the procedure, a History and Physical                            was performed, and patient medications and                            allergies were reviewed. The patient's tolerance of                            previous anesthesia was also reviewed. The risks                            and benefits of the procedure and the sedation                            options and risks were discussed with the patient.                            All questions were answered, and informed consent                            was obtained. Prior Anticoagulants: The patient has                            taken Xarelto (rivaroxaban), last dose was 3 days                            prior to procedure. ASA Grade Assessment: III - A                            patient with severe systemic disease. After  reviewing the risks and benefits, the patient was                            deemed in satisfactory condition to undergo the                            procedure.                           After obtaining informed consent, the colonoscope                            was passed under direct vision. Throughout the                            procedure,  the patient's blood pressure, pulse, and                            oxygen saturations were monitored continuously. The                            [id number]-HQ190L ([id number]) scope was introduced through                            the anus and advanced to the the terminal ileum.                            The colonoscopy was performed without difficulty.                            The patient tolerated the procedure well. The                            quality of the bowel preparation was evaluated                            using the BBPS [facility] Bowel Preparation Scale)                            with scores of: Right Colon = 3, Transverse Colon =                            3 and Left Colon = 3 (entire mucosa seen well with                            no residual staining, small fragments of stool or                            opaque liquid). The total BBPS score equals 9. The                            terminal ileum, ileocecal valve, appendiceal  orifice, and rectum were photographed. Scope In: 10:38:04 AM Scope Out: 10:51:25 AM Scope Withdrawal Time: 0 hours 8 minutes 42 seconds  Total Procedure Duration: 0 hours 13 minutes 21 seconds  Findings:      Hemorrhoids were found on perianal exam.      Scattered diverticula were found in the left colon.      Bleeding external hemorrhoids were found. The hemorrhoids were large.      Non-bleeding internal hemorrhoids were found during retroflexion. The       hemorrhoids were medium-sized.      The terminal ileum appeared normal. Impression:               - Hemorrhoids found on perianal exam.                           - Diverticulosis in the left colon.                           - Bleeding external hemorrhoids.                           - Non-bleeding internal hemorrhoids.                           - The examined portion of the ileum was normal.                           - No specimens collected.                            -Likely cause of hematochezia and IDA is                            Hemorrhoidal bleed Moderate Sedation:      Per Anesthesia Care Recommendation:           - Patient has a contact number available for                            emergencies. The signs and symptoms of potential                            delayed complications were discussed with the                            patient. Return to normal activities tomorrow.                            Written discharge instructions were provided to the                            patient.                           - Resume previous diet.                           - Continue present medications.                           -  High Fiber diet ( metamucil twice daily and                            Miralax twice daily)                           -Anusol suppository and preparation H for 5 days                           -Colorectal surgery referral                           -Cans start PO Iron q 48 hours , if Ferritin doesnt                            improve in next few months may consider Small bowel                            capsule endoscopy Procedure Code(s):        --- Professional ---                           [phone], Colonoscopy, flexible; diagnostic, including                            collection of specimen(s) by brushing or washing,                            when performed (separate procedure) Diagnosis Code(s):        --- Professional ---                           K64.4, Residual hemorrhoidal skin tags                           K64.8, Other hemorrhoids                           K92.1, Melena (includes Hematochezia)                           D50.9, Iron deficiency anemia, unspecified                           K57.30, Diverticulosis of large intestine without                            perforation or abscess without bleeding CPT copyright 2022 American Medical Association. All rights reserved. The codes documented in this report are  preliminary and upon coder review may  be revised to meet current compliance requirements. Sanjuan Dame, MD Sanjuan Dame, MD 03/25/2023 11:01:37 AM This report has been signed electronically. Number of Addenda: 0

## 2023-03-25 NOTE — Anesthesia Preprocedure Evaluation (Signed)
Anesthesia Evaluation  Patient identified by MRN, date of birth, ID band Patient awake    Reviewed: Allergy & Precautions, H&P , NPO status , Patient's Chart, lab work & pertinent test results, reviewed documented beta blocker date and time   Airway Mallampati: II  TM Distance: >3 FB Neck ROM: full    Dental no notable dental hx.    Pulmonary neg pulmonary ROS   Pulmonary exam normal breath sounds clear to auscultation       Cardiovascular Exercise Tolerance: Good hypertension, pulmonary hypertension+ dysrhythmias Atrial Fibrillation  Rhythm:irregular Rate:Normal     Neuro/Psych negative neurological ROS  negative psych ROS   GI/Hepatic negative GI ROS, Neg liver ROS,,,  Endo/Other  negative endocrine ROSHypothyroidism    Renal/GU negative Renal ROS  negative genitourinary   Musculoskeletal   Abdominal   Peds  Hematology negative hematology ROS (+) Blood dyscrasia, anemia   Anesthesia Other Findings   Reproductive/Obstetrics negative OB ROS                             Anesthesia Physical Anesthesia Plan  ASA: 3  Anesthesia Plan: General   Post-op Pain Management:    Induction:   PONV Risk Score and Plan: Propofol infusion  Airway Management Planned:   Additional Equipment:   Intra-op Plan:   Post-operative Plan:   Informed Consent: I have reviewed the patients History and Physical, chart, labs and discussed the procedure including the risks, benefits and alternatives for the proposed anesthesia with the patient or authorized representative who has indicated his/her understanding and acceptance.     Dental Advisory Given  Plan Discussed with: CRNA  Anesthesia Plan Comments:        Anesthesia Quick Evaluation

## 2023-03-25 NOTE — Op Note (Signed)
[facility] Patient Name: Shelly Fields Procedure Date: 03/25/2023 10:08 AM MRN: [id number] Date of Birth: [birth year] Attending MD: Sanjuan Dame , MD, [id number] CSN: [id number] Age: 88 Admit Type: Outpatient Procedure:                Upper GI endoscopy Indications:              Iron deficiency anemia Providers:                Sanjuan Dame, MD, Sheran Fava, Francoise Ceo                            RN, RN, Cyril Mourning, Technician Referring MD:              Medicines:                Monitored Anesthesia Care Complications:            No immediate complications. Estimated Blood Loss:     Estimated blood loss: none. Procedure:                Pre-Anesthesia Assessment:                           - Prior to the procedure, a History and Physical                            was performed, and patient medications and                            allergies were reviewed. The patient's tolerance of                            previous anesthesia was also reviewed. The risks                            and benefits of the procedure and the sedation                            options and risks were discussed with the patient.                            All questions were answered, and informed consent                            was obtained. Prior Anticoagulants: The patient has                            taken no anticoagulant or antiplatelet agents                            except for aspirin. ASA Grade Assessment: III - A                            patient with severe systemic disease. After  reviewing the risks and benefits, the patient was                            deemed in satisfactory condition to undergo the                            procedure.                           After obtaining informed consent, the endoscope was                            passed under direct vision. Throughout the                            procedure, the patient's  blood pressure, pulse, and                            oxygen saturations were monitored continuously. The                            [id number]-[id number] ([id number]) scope was introduced through the                            mouth, and advanced to the second part of duodenum.                            The upper GI endoscopy was accomplished without                            difficulty. The patient tolerated the procedure                            well. Scope In: 10:29:44 AM Scope Out: 10:33:29 AM Total Procedure Duration: 0 hours 3 minutes 45 seconds  Findings:      The examined esophagus was normal.      The gastroesophageal flap valve was visualized endoscopically and       classified as Hill Grade III (minimal fold, loose to endoscope, hiatal       hernia likely).      Mild inflammation characterized by erosions was found in the gastric       antrum. Biopsies were taken with a cold forceps for histology.      The duodenal bulb and second portion of the duodenum were normal. Impression:               - Normal esophagus.                           - Gastroesophageal flap valve classified as Hill                            Grade III (minimal fold, loose to endoscope, hiatal                            hernia likely).                           -  Gastritis. Biopsied.                           - Normal duodenal bulb and second portion of the                            duodenum. Moderate Sedation:      Per Anesthesia Care Recommendation:           - Patient has a contact number available for                            emergencies. The signs and symptoms of potential                            delayed complications were discussed with the                            patient. Return to normal activities tomorrow.                            Written discharge instructions were provided to the                            patient.                           - Resume previous diet.                            - Continue present medications.                           - Await pathology results. Procedure Code(s):        --- Professional ---                           [phone], Esophagogastroduodenoscopy, flexible,                            transoral; with biopsy, single or multiple Diagnosis Code(s):        --- Professional ---                           K29.70, Gastritis, unspecified, without bleeding                           D50.9, Iron deficiency anemia, unspecified CPT copyright 2022 American Medical Association. All rights reserved. The codes documented in this report are preliminary and upon coder review may  be revised to meet current compliance requirements. Sanjuan Dame, MD Sanjuan Dame, MD 03/25/2023 10:56:47 AM This report has been signed electronically. Number of Addenda: 0

## 2023-03-25 NOTE — Anesthesia Procedure Notes (Signed)
Date/Time: 03/25/2023 10:21 AM  Performed by: Franco Nones, CRNAPre-anesthesia Checklist: Patient identified, Emergency Drugs available, Suction available, Timeout performed and Patient being monitored Patient Re-evaluated:Patient Re-evaluated prior to induction Oxygen Delivery Method: Nasal Cannula

## 2023-03-25 NOTE — H&P (Signed)
Primary Care Physician:  Wendall Papa Primary Gastroenterologist:  Dr. Tasia Catchings  Pre-Procedure History & Physical: HPI: Shelly Fields is a 88 y.o. female with past medical history of cor pulmonale, HTN, hypothyroidism, A fib on DOAC, pulmonary HTN is here for evaluation of Worsening anasarca, blood per rectum , constipation and new microcytic anemia   Hemoglobin went from 11.5-8.4 MCV 85--->77.  This is considered an alarm symptom and patient advanced age   Patient previously had microscopic hematuria which has resolved on recent evaluation by urology evident by negative UA.  Patient continues to become more anemic with fresh blood per rectum  Microcytic anemia concerning for iron deficiency anemia bidirectional endoscopy is indicated to evaluate for upper GI and lower GI lesion    Past Medical History:  Diagnosis Date   Cor pulmonale (chronic) (HCC)    Diverticulosis    Essential hypertension    Hypothyroidism    Internal hemorrhoids    Persistent atrial fibrillation (HCC)    Pulmonary hypertension (HCC)    Seasonal allergies    Shingles     Past Surgical History:  Procedure Laterality Date   ABDOMINAL HYSTERECTOMY     CATARACT EXTRACTION     COLONOSCOPY N/A 01/12/2013   Procedure: COLONOSCOPY;  Surgeon: Malissa Hippo, MD;  Location: [facility];  Service: Endoscopy;  Laterality: N/A;  1030   COLONOSCOPY N/A 11/18/2017   Procedure: COLONOSCOPY;  Surgeon: Malissa Hippo, MD;  Location: [facility];  Service: Endoscopy;  Laterality: N/A;   POLYPECTOMY  11/18/2017   Procedure: POLYPECTOMY;  Surgeon: Malissa Hippo, MD;  Location: [facility];  Service: Endoscopy;;  colon    Prior to Admission medications   Medication Sig Start Date End Date Taking? Authorizing Provider  ALPRAZolam Prudy Feeler) 0.25 MG tablet Take 0.25 mg by mouth daily as needed for sleep. 01/02/22   [provider]  Carboxymethylcellulose Sodium (THERATEARS OP) Place 1 drop into both eyes  daily.     [provider]  dapagliflozin propanediol (FARXIGA) 10 MG TABS tablet Take 1 tablet (10 mg total) by mouth daily before breakfast. 12/15/22   Jonelle Sidle, MD  denosumab (PROLIA) 60 MG/ML SOSY injection Inject 60 mg into the skin every 6 (six) months.    [provider]  hydrocortisone (ANUSOL-HC) 2.5 % rectal cream Place 1 Application rectally 3 (three) times daily. Apply three times per day x10 days then PRN thereafter 01/27/22   Carlan, Jeral Pinch, NP  levothyroxine (SYNTHROID) 75 MCG tablet Take 75 mcg by mouth daily before breakfast.    [provider]  metoprolol succinate (TOPROL-XL) 50 MG 24 hr tablet TAKE 1 AND 1/2 TABLETS BY MOUTH  TWICE DAILY 08/03/22   Jonelle Sidle, MD  polyethylene glycol (MIRALAX / GLYCOLAX) 17 g packet Take 17 g by mouth 2 (two) times daily. 02/17/23 05/18/23  Alesana Magistro, Juanetta Beets, MD  potassium chloride SA (KLOR-CON M) 20 MEQ tablet Take 1 tablet (20 mEq total) by mouth daily. 03/10/23   Jonelle Sidle, MD  psyllium (METAMUCIL) 58.6 % packet Take 1 packet by mouth daily.    [provider]  psyllium (METAMUCIL) 58.6 % packet Take 1 packet by mouth 2 (two) times daily. 02/17/23 05/18/23  Franky Macho, MD  Rivaroxaban (XARELTO) 15 MG TABS tablet TAKE 1 TABLET BY MOUTH DAILY  WITH SUPPER 08/18/22   Jonelle Sidle, MD  torsemide (DEMADEX) 20 MG tablet Take 1 tablet (20 mg total) by mouth daily.  03/22/23   Dyann Kief, PA-C    Allergies as of 02/25/2023   (No Known Allergies)    Family History  Problem Relation Age of Onset   Heart disease Mother    Hypertension Father    Colon cancer Neg Hx     Social History   Socioeconomic History   Marital status: Widowed    Spouse name: Not on file   Number of children: Not on file   Years of education: Not on file   Highest education level: Not on file  Occupational History   Not on file  Tobacco Use   Smoking status: Never    Passive exposure:  Past   Smokeless tobacco: Never  Vaping Use   Vaping status: Never Used  Substance and Sexual Activity   Alcohol use: No    Alcohol/week: 0.0 standard drinks of alcohol   Drug use: No   Sexual activity: Not on file  Other Topics Concern   Not on file  Social History Narrative   Not on file   Social Determinants of Health   Financial Resource Strain: Not on file  Food Insecurity: Not on file  Transportation Needs: Not on file  Physical Activity: Not on file  Stress: Not on file  Social Connections: Not on file  Intimate Partner Violence: Not on file    Review of Systems: See HPI, otherwise negative ROS  Physical Exam: Vital signs in last 24 hours:     General:   Alert,  Well-developed, well-nourished, pleasant and cooperative in NAD Head:  Normocephalic and atraumatic. Eyes:  Sclera clear, no icterus.   Conjunctiva pink. Ears:  Normal auditory acuity. Nose:  No deformity, discharge,  or lesions. Msk:  Symmetrical without gross deformities. Normal posture. Extremities:  Without clubbing or edema. Neurologic:  Alert and  oriented x4;  grossly normal neurologically. Skin:  Intact without significant lesions or rashes. Psych:  Alert and cooperative. Normal mood and affect.  Impression/Plan:  Shelly Fields is a 88 y.o. female with past medical history of cor pulmonale, HTN, hypothyroidism, A fib on DOAC, pulmonary HTN is here for evaluation of Worsening anasarca, blood per rectum , constipation and new Iron deficiency anemia. (Ferritin 13) . Proceed with upper endoscopy and colonoscopy   The risks of the procedure including infection, bleed, or perforation as well as benefits, limitations, alternatives and imponderables have been reviewed with the patient. Questions have been answered. All parties agreeable.

## 2023-03-25 NOTE — Transfer of Care (Signed)
Immediate Anesthesia Transfer of Care Note  Patient: Shelly Fields  Procedure(s) Performed: COLONOSCOPY WITH PROPOFOL ESOPHAGOGASTRODUODENOSCOPY (EGD) WITH PROPOFOL BIOPSY  Patient Location: Short Stay  Anesthesia Type:General  Level of Consciousness: drowsy and patient cooperative  Airway & Oxygen Therapy: Patient Spontanous Breathing  Post-op Assessment: Report given to RN and Post -op Vital signs reviewed and stable  Post vital signs: Reviewed and stable  Last Vitals:  Vitals Value Taken Time  BP 111/61 03/25/23 1056  Temp 36.4 C 03/25/23 1056  Pulse 100 03/25/23 1056  Resp 21 03/25/23 1056  SpO2 96 % 03/25/23 1056    Last Pain:  Vitals:   03/25/23 1056  TempSrc: Oral  PainSc: 0-No pain         Complications: No notable events documented.

## 2023-03-25 NOTE — Discharge Instructions (Signed)
  Discharge instructions Please read the instructions outlined below and refer to this sheet in the next few weeks. These discharge instructions provide you with general information on caring for yourself after you leave the hospital. Your doctor may also give you specific instructions. While your treatment has been planned according to the most current medical practices available, unavoidable complications occasionally occur. If you have any problems or questions after discharge, please call your doctor. ACTIVITY You may resume your regular activity but move at a slower pace for the next 24 hours.  Take frequent rest periods for the next 24 hours.  Walking will help expel (get rid of) the air and reduce the bloated feeling in your abdomen.  No driving for 24 hours (because of the anesthesia (medicine) used during the test).  You may shower.  Do not sign any important legal documents or operate any machinery for 24 hours (because of the anesthesia used during the test).  NUTRITION Drink plenty of fluids.  You may resume your normal diet.  Begin with a light meal and progress to your normal diet.  Avoid alcoholic beverages for 24 hours or as instructed by your caregiver.  MEDICATIONS You may resume your normal medications unless your caregiver tells you otherwise.  WHAT YOU CAN EXPECT TODAY You may experience abdominal discomfort such as a feeling of fullness or "gas" pains.  FOLLOW-UP Your doctor will discuss the results of your test with you.  SEEK IMMEDIATE MEDICAL ATTENTION IF ANY OF THE FOLLOWING OCCUR: Excessive nausea (feeling sick to your stomach) and/or vomiting.  Severe abdominal pain and distention (swelling).  Trouble swallowing.  Temperature over 101 F (37.8 C).  Rectal bleeding or vomiting of blood.   Follow a high fiber diet: Include foods such as dates, prunes, pears, and kiwi. Take Miralax twice a day for the first week, then reduce to once daily thereafter. Use  Metamucil twice a day. Anusol and Preparation H Colorectal surgeon referral     I hope you have a great rest of your week!   Vista Lawman , M.D.. Gastroenterology and Hepatology [facility]

## 2023-03-26 LAB — SURGICAL PATHOLOGY

## 2023-03-28 NOTE — Anesthesia Postprocedure Evaluation (Signed)
Anesthesia Post Note  Patient: ANAIH BOSKOVICH  Procedure(s) Performed: COLONOSCOPY WITH PROPOFOL ESOPHAGOGASTRODUODENOSCOPY (EGD) WITH PROPOFOL BIOPSY  Patient location during evaluation: Phase II Anesthesia Type: General Level of consciousness: awake Pain management: pain level controlled Vital Signs Assessment: post-procedure vital signs reviewed and stable Respiratory status: spontaneous breathing and respiratory function stable Cardiovascular status: blood pressure returned to baseline and stable Postop Assessment: no headache and no apparent nausea or vomiting Anesthetic complications: no Comments: Late entry   No notable events documented.   Last Vitals:  Vitals:   03/25/23 0924 03/25/23 1056  BP: (!) 163/80 111/61  Pulse: 84 100  Resp: (!) 23 (!) 21  Temp: 36.4 C (!) 36.4 C  SpO2: 100% 96%    Last Pain:  Vitals:   03/25/23 1056  TempSrc: Oral  PainSc: 0-No pain                 Windell Norfolk

## 2023-03-29 ENCOUNTER — Other Ambulatory Visit (HOSPITAL_COMMUNITY): Payer: Self-pay | Admitting: Gastroenterology

## 2023-03-29 DIAGNOSIS — M810 Age-related osteoporosis without current pathological fracture: Secondary | ICD-10-CM | POA: Diagnosis not present

## 2023-03-29 DIAGNOSIS — A048 Other specified bacterial intestinal infections: Secondary | ICD-10-CM

## 2023-03-29 MED ORDER — BIS SUBCIT-METRONID-TETRACYC 140-125-125 MG PO CAPS: 3.0000 | ORAL_CAPSULE | Freq: Three times a day (TID) | ORAL | 0 refills | Status: DC

## 2023-03-29 NOTE — Progress Notes (Signed)
I reviewed the pathology results. [name], can you send her a letter with the findings as described below please?  Thanks,  Vista Lawman, MD Gastroenterology and Hepatology [facility]  ---------------------------------------------------------------------------------------------  [facility] [address], [address], [place], [place] [address] Phone:  [phone]   03/29/23 Shelly Fields, [place]   Dear [name],  I am writing to inform you that the biopsies taken during your recent endoscopic examination showed:  I am writing to let you know the results of your recent endoscopy (EGD).  You were found to have an infection called H. pylori, which is a bacteria that lives in the stomach which can also cause iron deficiency. We will send you a pack of medications to take for 14 days.  It is VERY IMPORTANT that you take all of the medications as directed.  Please call us at [phone] if you have persistent problems or have questions about your condition that have not been fully answered at this time.  Sincerely,  Vista Lawman, MD Gastroenterology and Hepatology

## 2023-03-30 ENCOUNTER — Encounter (INDEPENDENT_AMBULATORY_CARE_PROVIDER_SITE_OTHER): Payer: Self-pay | Admitting: *Deleted

## 2023-03-31 ENCOUNTER — Telehealth (INDEPENDENT_AMBULATORY_CARE_PROVIDER_SITE_OTHER): Payer: Self-pay | Admitting: *Deleted

## 2023-03-31 NOTE — Telephone Encounter (Signed)
-----   Message from Franky Macho sent at 03/30/2023  1:26 PM EDT ----- Regarding: RE: Colorectal referral for bleeding external hemorrhoids with anemia Can send to Dr Lovell Sheehan or Dr. Henreitta Leber here Thanks ----- Message ----- From: Simone Curia Sent: 03/30/2023   9:24 AM EDT To: Franky Macho, MD Subject: RE: Colorectal referral for bleeding externa#  Since [facility] doesn't take patient's insurance do you want to me send referral to [facility] general surgery [place] location  or Dr Jenkins/Dr Henreitta Leber here in [place] ----- Message ----- From: Franky Macho, MD Sent: 03/25/2023  11:13 AM EDT To: Simone Curia Subject: Colorectal referral for bleeding external he#  Hi Letrice Pollok   Can you refer her to [facility] colo-rectal   bleeding external hemorrhoids with anemia

## 2023-03-31 NOTE — Telephone Encounter (Signed)
Referral sent, they will contact patient with apt

## 2023-04-01 ENCOUNTER — Other Ambulatory Visit (INDEPENDENT_AMBULATORY_CARE_PROVIDER_SITE_OTHER): Payer: Self-pay | Admitting: *Deleted

## 2023-04-01 ENCOUNTER — Encounter (HOSPITAL_COMMUNITY): Payer: Self-pay | Admitting: Gastroenterology

## 2023-04-01 DIAGNOSIS — A048 Other specified bacterial intestinal infections: Secondary | ICD-10-CM

## 2023-04-01 MED ORDER — BIS SUBCIT-METRONID-TETRACYC 140-125-125 MG PO CAPS: 3.0000 | ORAL_CAPSULE | Freq: Three times a day (TID) | ORAL | 0 refills | Status: DC

## 2023-04-06 DIAGNOSIS — K648 Other hemorrhoids: Secondary | ICD-10-CM | POA: Diagnosis not present

## 2023-04-06 DIAGNOSIS — K2970 Gastritis, unspecified, without bleeding: Secondary | ICD-10-CM | POA: Diagnosis not present

## 2023-04-06 DIAGNOSIS — I10 Essential (primary) hypertension: Secondary | ICD-10-CM | POA: Diagnosis not present

## 2023-04-06 DIAGNOSIS — I5032 Chronic diastolic (congestive) heart failure: Secondary | ICD-10-CM | POA: Diagnosis not present

## 2023-04-06 DIAGNOSIS — Z299 Encounter for prophylactic measures, unspecified: Secondary | ICD-10-CM | POA: Diagnosis not present

## 2023-04-08 ENCOUNTER — Other Ambulatory Visit: Payer: Self-pay | Admitting: Cardiology

## 2023-04-12 NOTE — Progress Notes (Deleted)
Cardiology Office Note:  .   Date:  04/12/2023  ID:  Shelly Fields, DOB 1935-02-02, MRN [id number] PCP: [facility] Providers Cardiologist:  Nona Dell, MD { Click to update primary MD,subspecialty MD or APP then REFRESH:1}   History of Present Illness: .   Shelly Fields is a 88 y.o. female  with history of permanent Afib, HFpEF and HTN.    She saw Dr. Diona Browner 02/24/23 with increased fluid retention and abd CT showing Right pleural effusion. Shw as told to increase torsemide 40 mg daily for 3 days then 40 mg alternating with 20 mg daily. It was felt she could proceed with endoscopy if fluid retention resolved.  I saw her 03/22/23 with 12 lb wieght loss and no further edema. Very weak and nervous and weight down to 90 lbs HR 100/m. She was in Afib 117/m. Labs stable and went home and took an extral troprol. F/u EKG in office next day HR controlled. Endoscopy showed H.pylori.  Patient fell on her way into our office and had a laceration on her forehead with bleeding and we had to take her to ED.   ROS: ***  Studies Reviewed: Marland Kitchen         Prior CV Studies: {Select studies to display:26339}  Echo 02/2022 Study Result       ECHOCARDIOGRAM REPORT       Patient Name:   Shelly Fields [facility] Date of Exam: 02/25/2022  Medical Rec #:  [id number]      Height:       62.0 in  Accession #:    [id number]     Weight:       93.0 lb  Date of Birth:  1935-05-28     BSA:          1.381 m  Patient Age:    86 years       BP:           122/78 mmHg  Patient Gender: F              HR:           77 bpm.  Exam Location:  [place]   Procedure: 2D Echo, Cardiac Doppler, Color Doppler and Strain Analysis   Indications:    I50.32 (ICD-10-CM) - Chronic diastolic CHF (congestive  heart                 failure) (HCC)    History:        Patient has prior history of Echocardiogram examinations,  most                 recent 05/22/2020. CHF, Pulmonary HTN, Arrythmias:Atrial                   Fibrillation, Signs/Symptoms:Bilateral lower extremity  edema and                  Shortness of Breath; Risk Factors:Hypertension and  Non-Smoker.    Sonographer:   Jake Seats RDMS, RVT, RDCS  Referring Phys: [id number] [name] M [name]   IMPRESSIONS     1. Left ventricular ejection fraction, by estimation, is 60 to 65%. The  left ventricle has normal function. The left ventricle has no regional  wall motion abnormalities. Left ventricular diastolic parameters are  indeterminate. There is the  interventricular septum is flattened in systole and diastole, consistent  with right ventricular pressure and volume overload. The average left  ventricular  global longitudinal strain is -19.8 %. The global longitudinal  strain is normal.   2. Right ventricular systolic function is normal. The right ventricular  size is severely enlarged. There is mildly elevated pulmonary artery  systolic pressure. The estimated right ventricular systolic pressure is  36.3 mmHg.   3. Left atrial size was moderately dilated.   4. Right atrial size was markedly dilated.   5. A small pericardial effusion is present. The pericardial effusion is  posterior to the left ventricle.   6. The mitral valve is abnormal. Mild mitral valve regurgitation.   7. Tricuspid valve regurgitation is severe.   8. The aortic valve is tricuspid. Aortic valve regurgitation is mild.  Aortic regurgitation PHT measures 628 msec.   9. The inferior vena cava is dilated in size with <50% respiratory  variability, suggesting right atrial pressure of 15 mmHg.   Comparison(s): Prior images reviewed side by side. LVEF remains normal  range wtih LV septal flattening consistent with RV volume overload mainly.  There is severe RV enlargement and severe tricuspid regurgitation with  mildly estimated RVSP 36 mmHg.     Risk Assessment/Calculations:   {Does this patient have ATRIAL FIBRILLATION?:[id number]} No BP recorded.  {Refresh Note  OR Click here to enter BP  :1}***       Physical Exam:   VS:  There were no vitals taken for this visit.   Wt Readings from Last 3 Encounters:  03/25/23 90 lb 6.2 oz (41 kg)  03/22/23 90 lb (40.8 kg)  02/24/23 102 lb 3.2 oz (46.4 kg)    GEN: Well nourished, well developed in no acute distress NECK: No JVD; No carotid bruits CARDIAC: ***RRR, no murmurs, rubs, gallops RESPIRATORY:  Clear to auscultation without rales, wheezing or rhonchi  ABDOMEN: Soft, non-tender, non-distended EXTREMITIES:  No edema; No deformity   ASSESSMENT AND PLAN: .   Permanent atrial fibrillation with CHA2DS2-VASc score of 4.     She is  on Toprol & Xarelto for stroke prophylaxis.        HFpEF with LVEF 60 to 65%, severely dilated RV with mildly elevated PASP 36 mmHg by echocardiogram in September 2023.  She uses compression stockings and has been on Demadex   20 mg alternating with 40 mg every other day.  She has lost 12 lbs and appears to be over diuresed. Sodium was 129 and K 3.0. Check labs today and have a nurse visit tomorrow.    Essential hypertension.  Blood pressure is adequately controlled today.    Microcytic anemia with  EGD-H Plyori  Abdominal ultrasound did not show any acute findings. She would need to hold Xarelto 48 hours prior to endoscopy.  Fall with head laceration coming into office 04/26/23-had to take her to ED.              {Are you ordering a CV Procedure (e.g. stress test, cath, DCCV, TEE, etc)?   Press F2        :[id number]}  Dispo: ***  Signed, Jacolyn Reedy, PA-C

## 2023-04-14 ENCOUNTER — Ambulatory Visit (INDEPENDENT_AMBULATORY_CARE_PROVIDER_SITE_OTHER): Payer: Self-pay | Admitting: Audiology

## 2023-04-14 NOTE — Progress Notes (Signed)
Patient was seen today for a hearing aid check. She reported a lack of clarity with her Phonak M70 hearing aids. Cleaned the devices, replaced the wax filters, and cleared the venting. Otoscopy revealed clear ear canals. Updated the hearing aids to her last hearing test, she noticed an improvement in the sound volume and clarity. Set her to [facility] Digital Contrast 2.0 and to 100% of her overall gain. She was happy with the changes today. She also noted that her right hearing aid indicator light does not work and she is missing her left rentention line, discussed that we can send the hearing aids to the manufacturer for repair, but both issues should not impact the sound quality of the hearing aids. She would like to hold off sending the devices in. Instructed her to call if any concerns arise.     Conley Rolls Evelean Bigler, AUD, CCC-A 04/14/23

## 2023-04-15 ENCOUNTER — Ambulatory Visit: Payer: Medicare Other | Admitting: General Surgery

## 2023-04-15 ENCOUNTER — Encounter: Payer: Self-pay | Admitting: General Surgery

## 2023-04-15 VITALS — BP 133/80 | HR 77 | Temp 97.5°F | Resp 14 | Ht 62.0 in | Wt 93.8 lb

## 2023-04-15 DIAGNOSIS — K649 Unspecified hemorrhoids: Secondary | ICD-10-CM

## 2023-04-15 DIAGNOSIS — H43393 Other vitreous opacities, bilateral: Secondary | ICD-10-CM | POA: Diagnosis not present

## 2023-04-16 NOTE — Progress Notes (Signed)
Shelly Fields; [id number]; [birth year]   HPI Patient is an 88 year old white female who was referred to my care by Dr. Sherril Croon and Dr. Tasia Catchings of gastroenterology for evaluation and treatment of hemorrhoidal disease.  Patient has had intermittent episodes of bleeding hemorrhoids.  She states it occurs on occasion when she wipes herself.  She has been on laxatives to help avoid constipation.  She has multiple core morbidities including pulmonary hypertension, cor pulmonale, atrial fibrillation, and chronic anticoagulation usage.  She states she has had the hemorrhoids for many years.  She states that recently her hemorrhoids have not been too bad.  She does not want surgery unless absolutely necessary.  She has been having frequent visits with the cardiologist for tachycardia and leg swelling. Past Medical History:  Diagnosis Date   Cor pulmonale (chronic) (HCC)    Diverticulosis    Essential hypertension    Hypothyroidism    Internal hemorrhoids    Persistent atrial fibrillation (HCC)    Pulmonary hypertension (HCC)    Seasonal allergies    Shingles     Past Surgical History:  Procedure Laterality Date   ABDOMINAL HYSTERECTOMY     BIOPSY  03/25/2023   Procedure: BIOPSY;  Surgeon: Franky Macho, MD;  [facility]: [facility];  Service: Endoscopy;;   CATARACT EXTRACTION     COLONOSCOPY N/A 01/12/2013   Procedure: COLONOSCOPY;  Surgeon: Malissa Hippo, MD;  [facility]: [facility];  Service: Endoscopy;  Laterality: N/A;  1030   COLONOSCOPY N/A 11/18/2017   Procedure: COLONOSCOPY;  Surgeon: Malissa Hippo, MD;  [facility]: [facility];  Service: Endoscopy;  Laterality: N/A;   COLONOSCOPY WITH PROPOFOL N/A 03/25/2023   Procedure: COLONOSCOPY WITH PROPOFOL;  Surgeon: Franky Macho, MD;  [facility]: [facility];  Service: Endoscopy;  Laterality: N/A;  11:00AM;ASA 3   ESOPHAGOGASTRODUODENOSCOPY (EGD) WITH PROPOFOL N/A 03/25/2023   Procedure: ESOPHAGOGASTRODUODENOSCOPY (EGD) WITH PROPOFOL;   Surgeon: Franky Macho, MD;  [facility];  Service: Endoscopy;  Laterality: N/A;  11:00;ASA 3   POLYPECTOMY  11/18/2017   Procedure: POLYPECTOMY;  Surgeon: Malissa Hippo, MD;  [facility]: [facility];  Service: Endoscopy;;  colon    Family History  Problem Relation Age of Onset   Heart disease Mother    Hypertension Father    Colon cancer Neg Hx     Current Outpatient Medications on File Prior to Visit  Medication Sig Dispense Refill   ALPRAZolam (XANAX) 0.25 MG tablet Take 0.25 mg by mouth daily as needed for sleep.     Carboxymethylcellulose Sodium (THERATEARS OP) Place 1 drop into both eyes daily.      dapagliflozin propanediol (FARXIGA) 10 MG TABS tablet Take 1 tablet (10 mg total) by mouth daily before breakfast. 90 tablet 3   denosumab (PROLIA) 60 MG/ML SOSY injection Inject 60 mg into the skin every 6 (six) months.     ferrous gluconate (FERGON) 324 MG tablet Take 1 tablet (324 mg total) by mouth every other day.     levothyroxine (SYNTHROID) 75 MCG tablet Take 75 mcg by mouth daily before breakfast.     metoprolol succinate (TOPROL-XL) 50 MG 24 hr tablet TAKE 1 AND 1/2 TABLETS BY MOUTH  TWICE DAILY 300 tablet 2   polyethylene glycol (MIRALAX / GLYCOLAX) 17 g packet Take 17 g by mouth daily. 90 packet 0   potassium chloride SA (KLOR-CON M) 20 MEQ tablet Take 1 tablet (20 mEq total) by mouth daily. 90 tablet 1  psyllium (METAMUCIL) 58.6 % packet Take 1 packet by mouth 2 (two) times daily. 30 each 12   Rivaroxaban (XARELTO) 15 MG TABS tablet TAKE 1 TABLET BY MOUTH DAILY  WITH SUPPER 100 tablet 1   torsemide (DEMADEX) 20 MG tablet Take 1 tablet (20 mg total) by mouth daily. 90 tablet 2   No current facility-administered medications on file prior to visit.    No Known Allergies  Social History   Substance and Sexual Activity  Alcohol Use No   Alcohol/week: 0.0 standard drinks of alcohol    Social History   Tobacco Use  Smoking Status Never   Passive  exposure: Past  Smokeless Tobacco Never    Review of Systems  Constitutional: Negative.   HENT: Negative.    Eyes: Negative.   Respiratory: Negative.    Cardiovascular: Negative.   Gastrointestinal: Negative.   Genitourinary: Negative.   Musculoskeletal: Negative.   Skin: Negative.   Neurological: Negative.   Endo/Heme/Allergies:  Bruises/bleeds easily.  Psychiatric/Behavioral: Negative.      Objective   Vitals:   04/15/23 0929  BP: 133/80  Pulse: 77  Resp: 14  Temp: (!) 97.5 F (36.4 C)  SpO2: 97%    Physical Exam Vitals reviewed.  Constitutional:      Appearance: Normal appearance. She is normal weight. She is not ill-appearing.  HENT:     Head: Normocephalic and atraumatic.  Cardiovascular:     Rate and Rhythm: Normal rate. Rhythm irregular.     Heart sounds: Normal heart sounds. No murmur heard.    No friction rub. No gallop.  Pulmonary:     Effort: Pulmonary effort is normal. No respiratory distress.     Breath sounds: Normal breath sounds. No stridor. No wheezing, rhonchi or rales.  Genitourinary:    Comments: Patient does have prolapsing internal and external hemorrhoid with some mucosal prolapse at the 9 o'clock position.  She has other mucosal prolapsing which is minor.  No active bleeding noted.  Her sphincter tone is fair. Neurological:     Mental Status: She is alert and oriented to person, place, and time.    Cardiology notes reviewed  GI notes reviewed Assessment  Hemorrhoidal disease with intermittent blood on the toilet paper, chronic anticoagulation, significant cardiac disease including atrial fibrillation and cor pulmonale.  Plan  At this point, no need for surgical intervention.  Given her multiple comorbidities, she is a high risk surgical candidate.  She understands this and agrees.  I told her to continue the medicated wipes.  Should her bleeding become extreme, she was instructed to go to the emergency room.  As she has had intermittent  bleeding from her hemorrhoids for many years, she understands how to treated.  Follow-up with me as needed.

## 2023-04-26 ENCOUNTER — Encounter (HOSPITAL_COMMUNITY): Payer: Self-pay | Admitting: Emergency Medicine

## 2023-04-26 ENCOUNTER — Other Ambulatory Visit: Payer: Self-pay

## 2023-04-26 ENCOUNTER — Ambulatory Visit: Payer: Medicare Other | Admitting: Physician Assistant

## 2023-04-26 ENCOUNTER — Emergency Department (HOSPITAL_COMMUNITY): Payer: Medicare Other

## 2023-04-26 ENCOUNTER — Emergency Department (HOSPITAL_COMMUNITY)
Admission: EM | Admit: 2023-04-26 | Discharge: 2023-04-26 | Disposition: A | Discharge status: Home / Self Care | Payer: Medicare Other | Attending: Emergency Medicine | Admitting: Emergency Medicine

## 2023-04-26 DIAGNOSIS — S0990XA Unspecified injury of head, initial encounter: Secondary | ICD-10-CM | POA: Diagnosis not present

## 2023-04-26 DIAGNOSIS — Y9301 Activity, walking, marching and hiking: Secondary | ICD-10-CM | POA: Insufficient documentation

## 2023-04-26 DIAGNOSIS — Z7901 Long term (current) use of anticoagulants: Secondary | ICD-10-CM | POA: Diagnosis not present

## 2023-04-26 DIAGNOSIS — Z79899 Other long term (current) drug therapy: Secondary | ICD-10-CM | POA: Insufficient documentation

## 2023-04-26 DIAGNOSIS — E039 Hypothyroidism, unspecified: Secondary | ICD-10-CM | POA: Diagnosis not present

## 2023-04-26 DIAGNOSIS — R220 Localized swelling, mass and lump, head: Secondary | ICD-10-CM | POA: Diagnosis not present

## 2023-04-26 DIAGNOSIS — Z79890 Hormone replacement therapy (postmenopausal): Secondary | ICD-10-CM | POA: Diagnosis not present

## 2023-04-26 DIAGNOSIS — W01198A Fall on same level from slipping, tripping and stumbling with subsequent striking against other object, initial encounter: Secondary | ICD-10-CM | POA: Diagnosis not present

## 2023-04-26 DIAGNOSIS — I10 Essential (primary) hypertension: Secondary | ICD-10-CM | POA: Diagnosis not present

## 2023-04-26 DIAGNOSIS — Z23 Encounter for immunization: Secondary | ICD-10-CM | POA: Insufficient documentation

## 2023-04-26 DIAGNOSIS — S0181XA Laceration without foreign body of other part of head, initial encounter: Secondary | ICD-10-CM | POA: Insufficient documentation

## 2023-04-26 DIAGNOSIS — S199XXA Unspecified injury of neck, initial encounter: Secondary | ICD-10-CM | POA: Diagnosis not present

## 2023-04-26 MED ORDER — ACETAMINOPHEN 325 MG PO TABS
650.0000 mg | ORAL_TABLET | Freq: Once | ORAL | Status: AC
  Administered 2023-04-26: 650 mg via ORAL
  Filled 2023-04-26: qty 2

## 2023-04-26 MED ORDER — DOUBLE ANTIBIOTIC 500-10000 UNIT/GM EX OINT
TOPICAL_OINTMENT | Freq: Once | CUTANEOUS | Status: AC
  Filled 2023-04-26: qty 1

## 2023-04-26 MED ORDER — LIDOCAINE-EPINEPHRINE (PF) 1 %-1:200000 IJ SOLN
30.0000 mL | Freq: Once | INTRAMUSCULAR | Status: AC
  Administered 2023-04-26: 30 mL
  Filled 2023-04-26: qty 30

## 2023-04-26 MED ORDER — TETANUS-DIPHTH-ACELL PERTUSSIS 5-2.5-18.5 LF-MCG/0.5 IM SUSY
0.5000 mL | PREFILLED_SYRINGE | Freq: Once | INTRAMUSCULAR | Status: AC
  Administered 2023-04-26: 0.5 mL via INTRAMUSCULAR
  Filled 2023-04-26: qty 0.5

## 2023-04-26 NOTE — ED Triage Notes (Signed)
Pt fell in parking lot coming to [facility] for appt, pt hit face on asphalt, pt on Xarelto 15mg  daily

## 2023-04-26 NOTE — Discharge Instructions (Signed)
You are seen today for a head injury and a laceration to the left forehead area.  Your wound was repaired with sutures.  Keep it clean and dry, use antibiotic ointment daily.  Have the sutures removed about 5 days either at PCP or urgent care.  Come back if you have confusion, headache, vomiting, fever, or other worrisome changes.

## 2023-04-26 NOTE — ED Provider Notes (Signed)
EMERGENCY DEPARTMENT AT [facility] Provider Note   CSN: [id number] Arrival date & time: 04/26/23  1343     History  Chief Complaint  Patient presents with   Shelly Fields is a 88 y.o. female.  She  has a past medical history of Cor pulmonale (chronic) (HCC), Diverticulosis, Essential hypertension, Hypothyroidism, Internal hemorrhoids, Persistent atrial fibrillation (HCC), Pulmonary hypertension (HCC), Seasonal allergies, and Shingles. She is on Xarelto for her A-fib.    She presents the ER today for evaluation of head injury.  She was walking inside to her cardiology appointment and her toe caught on an uneven spot in the pavement and she fell forward striking the left side of her forehead on the pavement.  She denies LOC, no numbness tingling or weakness but does report a mild headache and tenderness at the site of laceration.  No vision changes.  No neck pain no chest back or abdominal pain. She is unsure of the date of her last tetanus shot   Fall       Home Medications Prior to Admission medications   Medication Sig Start Date End Date Taking? Authorizing Provider  ALPRAZolam Prudy Feeler) 0.25 MG tablet Take 0.25 mg by mouth daily as needed for sleep. 01/02/22   [provider]  Carboxymethylcellulose Sodium (THERATEARS OP) Place 1 drop into both eyes daily.     [provider]  dapagliflozin propanediol (FARXIGA) 10 MG TABS tablet Take 1 tablet (10 mg total) by mouth daily before breakfast. 12/15/22   Jonelle Sidle, MD  denosumab (PROLIA) 60 MG/ML SOSY injection Inject 60 mg into the skin every 6 (six) months.    [provider]  ferrous gluconate (FERGON) 324 MG tablet Take 1 tablet (324 mg total) by mouth every other day. 03/25/23 06/23/23  Franky Macho, MD  levothyroxine (SYNTHROID) 75 MCG tablet Take 75 mcg by mouth daily before breakfast.    [provider]  metoprolol succinate (TOPROL-XL) 50 MG 24 hr  tablet TAKE 1 AND 1/2 TABLETS BY MOUTH  TWICE DAILY 04/08/23   Jonelle Sidle, MD  polyethylene glycol (MIRALAX / GLYCOLAX) 17 g packet Take 17 g by mouth daily. 03/25/23 06/23/23  Ahmed, Juanetta Beets, MD  potassium chloride SA (KLOR-CON M) 20 MEQ tablet Take 1 tablet (20 mEq total) by mouth daily. 03/10/23   Jonelle Sidle, MD  psyllium (METAMUCIL) 58.6 % packet Take 1 packet by mouth 2 (two) times daily. 03/25/23   Ahmed, Juanetta Beets, MD  Rivaroxaban (XARELTO) 15 MG TABS tablet TAKE 1 TABLET BY MOUTH DAILY  WITH SUPPER 08/18/22   Jonelle Sidle, MD  torsemide (DEMADEX) 20 MG tablet Take 1 tablet (20 mg total) by mouth daily. 03/22/23   Dyann Kief, PA-C      Allergies    Patient has no known allergies.    Review of Systems   Review of Systems  Physical Exam Updated Vital Signs BP (!) 159/78   Pulse 77   Temp 97.6 F (36.4 C)   Ht 5\' 2"  (1.575 m)   Wt 42.2 kg   SpO2 100%   BMI 17.01 kg/m  Physical Exam Vitals and nursing note reviewed.  Constitutional:      General: She is not in acute distress.    Appearance: She is well-developed.  HENT:     Head: Normocephalic and atraumatic.     Right Ear: Tympanic membrane normal.     Left Ear:  Tympanic membrane normal.     Nose: Nose normal.     Mouth/Throat:     Mouth: Mucous membranes are moist.  Eyes:     General:        Right eye: No discharge.        Left eye: No discharge.     Extraocular Movements: Extraocular movements intact.     Conjunctiva/sclera: Conjunctivae normal.     Pupils: Pupils are equal, round, and reactive to light.  Cardiovascular:     Rate and Rhythm: Normal rate and regular rhythm.     Heart sounds: No murmur heard. Pulmonary:     Effort: Pulmonary effort is normal. No respiratory distress.     Breath sounds: Normal breath sounds.  Abdominal:     Palpations: Abdomen is soft.     Tenderness: There is no abdominal tenderness.  Musculoskeletal:        General: No swelling.     Cervical  back: Normal range of motion and neck supple. No tenderness.  Skin:    General: Skin is warm and dry.     Capillary Refill: Capillary refill takes less than 2 seconds.     Comments: 6 cm laceration left supraorbital area with surrounding abrasions  Neurological:     General: No focal deficit present.     Mental Status: She is alert and oriented to person, place, and time.  Psychiatric:        Mood and Affect: Mood normal.     ED Results / Procedures / Treatments   Labs (all labs ordered are listed, but only abnormal results are displayed) Labs Reviewed - No data to display  EKG None  Radiology No results found.  Procedures .Marland KitchenLaceration Repair  Date/Time: 04/26/2023 5:26 PM  Performed by: Ma Rings, PA-C Authorized by: Ma Rings, PA-C   Consent:    Consent obtained:  Verbal   Consent given by:  Patient   Risks discussed:  Pain, need for additional repair, poor cosmetic result and infection Universal protocol:    Procedure explained and questions answered to patient or proxy's satisfaction: yes     Patient identity confirmed:  Verbally with patient Anesthesia:    Anesthesia method:  Local infiltration   Local anesthetic:  Lidocaine 1% WITH epi Laceration details:    Location:  Face   Length (cm):  6 Exploration:    Hemostasis achieved with:  Epinephrine and direct pressure   Wound exploration: entire depth of wound visualized     Wound extent: no foreign body, no signs of injury and no vascular damage     Contaminated: no   Treatment:    Area cleansed with:  Povidone-iodine   Amount of cleaning:  Standard   Irrigation solution:  Sterile saline   Irrigation method:  Syringe   Debridement:  None   Undermining:  None Skin repair:    Repair method:  Sutures   Suture size:  5-0   Suture material:  Nylon   Suture technique:  Simple interrupted   Number of sutures:  10 Approximation:    Approximation:  Close Repair type:    Repair type:   Simple Post-procedure details:    Dressing:  Antibiotic ointment and non-adherent dressing   Procedure completion:  Tolerated well, no immediate complications     Medications Ordered in ED Medications  lidocaine-EPINEPHrine (PF) (XYLOCAINE-EPINEPHrine) 1 %-1:200000 (PF) injection 30 mL (has no administration in time range)  polymixin-bacitracin (POLYSPORIN) ointment (has no administration in time range)  Tdap (BOOSTRIX) injection 0.5 mL (0.5 mLs Intramuscular Given 04/26/23 1439)  acetaminophen (TYLENOL) tablet 650 mg (650 mg Oral Given 04/26/23 1436)    ED Course/ Medical Decision Making/ A&P                                 Medical Decision Making This patient presents to the ED for concern of head injury, this involves an extensive number of treatment options, and is a complaint that carries with it a high risk of complications and morbidity.  The differential diagnosis includes ICH, concussion, laceration, other   Co morbidities that complicate the patient evaluation  A fib on chronic anticoagulation   Additional history obtained:  Additional history obtained from EMR External records from outside source obtained and reviewed including  notes    Imaging Studies ordered:  I ordered imaging studies including ct head and cervical spine  I independently visualized and interpreted imaging which showed no ICH, no skull fx, no fx or alignment of c spine I agree with the radiologist interpretation     Problem List / ED Course / Critical interventions / Medication management  Fall on blood thinners with laceration to left supraorbital area.  CTs are normal, tetanus shot updated, patient feels better after Tylenol.  He was repaired with 10 sutures.  Patient's son is at bedside as well now.  They were advised on suture movable in about 5 days at PCP or urgent care, given strict return precautions, her son states that he and his brother who both live next-door to the patient.  To  keep an eye on her tonight.  Discussed small but nonzero risk of delayed intracranial hemorrhage and to watch for somnolence, confusion, severe headache, vomiting or other changes.  Advised to keep area clean and dry, covered with bacitracin.  I have reviewed the patients home medicines and have made adjustments as needed     Amount and/or Complexity of Data Reviewed Radiology: ordered.  Risk OTC drugs. Prescription drug management.           Final Clinical Impression(s) / ED Diagnoses Final diagnoses:  Injury of head, initial encounter  Facial laceration, initial encounter    Rx / DC Orders ED Discharge Orders     None         Josem Kaufmann 04/26/23 1728    Gloris Manchester, MD 04/27/23 [phone]

## 2023-04-30 DIAGNOSIS — I5032 Chronic diastolic (congestive) heart failure: Secondary | ICD-10-CM | POA: Diagnosis not present

## 2023-04-30 DIAGNOSIS — J449 Chronic obstructive pulmonary disease, unspecified: Secondary | ICD-10-CM | POA: Diagnosis not present

## 2023-04-30 DIAGNOSIS — I4891 Unspecified atrial fibrillation: Secondary | ICD-10-CM | POA: Diagnosis not present

## 2023-04-30 DIAGNOSIS — I10 Essential (primary) hypertension: Secondary | ICD-10-CM | POA: Diagnosis not present

## 2023-04-30 DIAGNOSIS — Z299 Encounter for prophylactic measures, unspecified: Secondary | ICD-10-CM | POA: Diagnosis not present

## 2023-05-11 NOTE — Progress Notes (Signed)
Cardiology Office Note:  .   Date:  05/24/2023  ID:  [name], DOB June 27, 1934, MRN [id number] PCP: Ignatius Specking, MD  [facility] Providers Cardiologist:  Nona Dell, MD    History of Present Illness: .   Geneve Pellicane [name] is a 88 y.o. female  with history of permanent Afib, HFpEF and HTN.    She saw Dr. Diona Browner 02/24/23 with increased fluid retention and abd CT showing Right pleural effusion. She as told to increase torsemide 40 mg daily for 3 days then 40 mg alternating with 20 mg daily. It was felt she could proceed with endoscopy if fluid retention resolved.  I saw her 03/22/23 with 12 lb wieght loss and no further edema. Very weak and nervous and weight down to 90 lbs HR 100/m. She was in Afib 117/m. Labs stable and went home and took an extral troprol. F/u EKG in office next day HR controlled. Endoscopy showed H.pylori.  Patient fell on her way into our office and had a laceration on her forehead with bleeding and we had to take her to ED. She come in today for f/u. Has some pus oozing from her head hematoma. She denies chest pain, dyspnea, dizziness or edema. Weight has been stable. Many questions about meds reviewed. She has an appt to follow up with PCP about head injury. Asking for xarelto samples-she is in the donut hole and 3 months would cost her $500.    ROS:    Studies Reviewed: Marland Kitchen         Prior CV Studies:   Echo 02/2022 Study Result       ECHOCARDIOGRAM REPORT       Patient Name:   ROSETTE SUTHERLIN [facility] Date of Exam: 02/25/2022  Medical Rec #:  [id number]      Height:       62.0 in  Accession #:    [id number]     Weight:       93.0 lb  Date of Birth:  1934/09/12     BSA:          1.381 m  Patient Age:    86 years       BP:           122/78 mmHg  Patient Gender: F              HR:           77 bpm.  Exam Location:  [place]   Procedure: 2D Echo, Cardiac Doppler, Color Doppler and Strain Analysis   Indications:    I50.32 (ICD-10-CM) - Chronic diastolic CHF  (congestive  heart                 failure) (HCC)    History:        Patient has prior history of Echocardiogram examinations,  most                 recent 05/22/2020. CHF, Pulmonary HTN, Arrythmias:Atrial                  Fibrillation, Signs/Symptoms:Bilateral lower extremity  edema and                  Shortness of Breath; Risk Factors:Hypertension and  Non-Smoker.    Sonographer:   Jake Seats RDMS, RVT, RDCS  Referring Phys: [id number] Monti Villers M Geovanny Sartin   IMPRESSIONS     1. Left ventricular ejection fraction, by estimation, is 60 to 65%. The  left ventricle has normal function. The left ventricle has no regional  wall motion abnormalities. Left ventricular diastolic parameters are  indeterminate. There is the  interventricular septum is flattened in systole and diastole, consistent  with right ventricular pressure and volume overload. The average left  ventricular global longitudinal strain is -19.8 %. The global longitudinal  strain is normal.   2. Right ventricular systolic function is normal. The right ventricular  size is severely enlarged. There is mildly elevated pulmonary artery  systolic pressure. The estimated right ventricular systolic pressure is  36.3 mmHg.   3. Left atrial size was moderately dilated.   4. Right atrial size was markedly dilated.   5. A small pericardial effusion is present. The pericardial effusion is  posterior to the left ventricle.   6. The mitral valve is abnormal. Mild mitral valve regurgitation.   7. Tricuspid valve regurgitation is severe.   8. The aortic valve is tricuspid. Aortic valve regurgitation is mild.  Aortic regurgitation PHT measures 628 msec.   9. The inferior vena cava is dilated in size with <50% respiratory  variability, suggesting right atrial pressure of 15 mmHg.   Comparison(s): Prior images reviewed side by side. LVEF remains normal  range wtih LV septal flattening consistent with RV volume overload mainly.  There is  severe RV enlargement and severe tricuspid regurgitation with  mildly estimated RVSP 36 mmHg.     Risk Assessment/Calculations:    CHA2DS2-VASc Score = 5   This indicates a 7.2% annual risk of stroke. The patient's score is based upon: CHF History: 1 HTN History: 1 Diabetes History: 0 Stroke History: 0 Vascular Disease History: 0 Age Score: 2 Gender Score: 1            Physical Exam:   VS:  BP 110/70 (BP Location: Right Arm)   Pulse 68   Ht 5\' 2"  (1.575 m)   Wt 93 lb 3.2 oz (42.3 kg)   BMI 17.05 kg/m    Wt Readings from Last 3 Encounters:  05/24/23 93 lb 3.2 oz (42.3 kg)  04/26/23 93 lb (42.2 kg)  04/15/23 93 lb 12.8 oz (42.5 kg)    GEN: Thin, in no acute distress. Hematoma on forehead from fall with some pus/blood coming out. I cleaned it off. NECK: No JVD; No carotid bruits CARDIAC:  RRR,  1/6 systolic murmur LSB RESPIRATORY:  Clear to auscultation without rales, wheezing or rhonchi  ABDOMEN: Soft, non-tender, non-distended EXTREMITIES:  No edema; No deformity   ASSESSMENT AND PLAN: .    Permanent atrial fibrillation with CHA2DS2-VASc score of 4.     She is  on Toprol & Xarelto for stroke prophylaxis. Will give her Xarelto samples to get her through the first of the year.     HFpEF with LVEF 60 to 65%, severely dilated RV with mildly elevated PASP 36 mmHg by echocardiogram in September 2023.  She uses compression stockings and I reduced her  Demadex to 20 mg daily LOV. She is euvolemic. Will check labs today. Consider making demadex prn.    Essential hypertension.  Blood pressure is well controlled today.    Microcytic anemia with  EGD-H Plyori-treated  Fall with head laceration coming into office 04/26/23-had to take her to ED. CT ok. Still has evidence of bruising and quarter size hematoma that did have some pus coming out. Has f/u with PCP on Friday after stitches were recently removed.  Dispo: f/u in 6 months.  Signed, Jacolyn Reedy,  PA-C

## 2023-05-14 DIAGNOSIS — S01112A Laceration without foreign body of left eyelid and periocular area, initial encounter: Secondary | ICD-10-CM | POA: Diagnosis not present

## 2023-05-14 DIAGNOSIS — I5032 Chronic diastolic (congestive) heart failure: Secondary | ICD-10-CM | POA: Diagnosis not present

## 2023-05-14 DIAGNOSIS — I4891 Unspecified atrial fibrillation: Secondary | ICD-10-CM | POA: Diagnosis not present

## 2023-05-14 DIAGNOSIS — Z299 Encounter for prophylactic measures, unspecified: Secondary | ICD-10-CM | POA: Diagnosis not present

## 2023-05-14 DIAGNOSIS — I10 Essential (primary) hypertension: Secondary | ICD-10-CM | POA: Diagnosis not present

## 2023-05-18 ENCOUNTER — Telehealth: Payer: Self-pay

## 2023-05-18 NOTE — Telephone Encounter (Signed)
Transition Care Management Unsuccessful Follow-up Telephone Call  Date of discharge and from where:  [facility] 11/18  Attempts:  1st Attempt  Reason for unsuccessful TCM follow-up call:  No answer/busy   Shelly Fields  [facility], [facility] Guide, Phone: [phone] Website: [email or web address]

## 2023-05-18 NOTE — Telephone Encounter (Signed)
Transition Care Management Unsuccessful Follow-up Telephone Call  Date of discharge and from where:  [facility]  Attempts:  2nd Attempt  Reason for unsuccessful TCM follow-up call:  No answer/busy   Lenard Forth [place]  [facility], [facility] Guide, Phone: [phone] Website: [email or web address]

## 2023-05-20 ENCOUNTER — Telehealth: Payer: Self-pay | Admitting: Internal Medicine

## 2023-05-20 NOTE — Telephone Encounter (Signed)
Patient scheduled for 06/14/23 follow up visit.

## 2023-05-20 NOTE — Telephone Encounter (Signed)
Just found report on her 42 that suggests she may need to wear it at bedtime but let's her back to the office 1st to regroup with all meds in hand

## 2023-05-24 ENCOUNTER — Encounter: Payer: Self-pay | Admitting: Physician Assistant

## 2023-05-24 ENCOUNTER — Ambulatory Visit: Payer: Medicare Other | Attending: Physician Assistant | Admitting: Physician Assistant

## 2023-05-24 ENCOUNTER — Other Ambulatory Visit (HOSPITAL_COMMUNITY)
Admission: RE | Admit: 2023-05-24 | Discharge: 2023-05-24 | Disposition: A | Discharge status: Home / Self Care | Payer: Medicare Other | Source: Ambulatory Visit | Attending: Physician Assistant | Admitting: Physician Assistant

## 2023-05-24 VITALS — BP 110/70 | HR 68 | Ht 62.0 in | Wt 93.2 lb

## 2023-05-24 DIAGNOSIS — I5032 Chronic diastolic (congestive) heart failure: Secondary | ICD-10-CM

## 2023-05-24 DIAGNOSIS — Z9181 History of falling: Secondary | ICD-10-CM

## 2023-05-24 DIAGNOSIS — I4821 Permanent atrial fibrillation: Secondary | ICD-10-CM | POA: Diagnosis not present

## 2023-05-24 DIAGNOSIS — D509 Iron deficiency anemia, unspecified: Secondary | ICD-10-CM

## 2023-05-24 DIAGNOSIS — I4819 Other persistent atrial fibrillation: Secondary | ICD-10-CM

## 2023-05-24 DIAGNOSIS — I10 Essential (primary) hypertension: Secondary | ICD-10-CM

## 2023-05-24 LAB — COMPREHENSIVE METABOLIC PANEL
ALT: 17 U/L (ref 0–44)
AST: 26 U/L (ref 15–41)
Albumin: 3.7 g/dL (ref 3.5–5.0)
Alkaline Phosphatase: 89 U/L (ref 38–126)
Anion gap: 10 (ref 5–15)
BUN: 18 mg/dL (ref 8–23)
CO2: 30 mmol/L (ref 22–32)
Calcium: 9.7 mg/dL (ref 8.9–10.3)
Chloride: 91 mmol/L — ABNORMAL LOW (ref 98–111)
Creatinine, Ser: 0.88 mg/dL (ref 0.44–1.00)
GFR, Estimated: >60 mL/min (ref >60)
Glucose, Bld: 99 mg/dL (ref 70–99)
Potassium: 3 mmol/L — ABNORMAL LOW (ref 3.5–5.1)
Sodium: 131 mmol/L — ABNORMAL LOW (ref 135–145)
Total Bilirubin: 0.8 mg/dL (ref <1.2)
Total Protein: 7.9 g/dL (ref 6.5–8.1)

## 2023-05-24 LAB — CBC
HCT: 32.8 % — ABNORMAL LOW (ref 36.0–46.0)
Hemoglobin: 10.1 g/dL — ABNORMAL LOW (ref 12.0–15.0)
MCH: 27.2 pg (ref 26.0–34.0)
MCHC: 30.8 g/dL (ref 30.0–36.0)
MCV: 88.4 fL (ref 80.0–100.0)
Platelets: 342 10*3/uL (ref 150–400)
RBC: 3.71 MIL/uL — ABNORMAL LOW (ref 3.87–5.11)
RDW: 18.2 % — ABNORMAL HIGH (ref 11.5–15.5)
WBC: 6.6 10*3/uL (ref 4.0–10.5)
nRBC: 0 % (ref 0.0–0.2)

## 2023-05-24 MED ORDER — RIVAROXABAN 15 MG PO TABS: 15.0000 mg | ORAL_TABLET | Freq: Every day | ORAL | 0 refills | Status: DC

## 2023-05-24 NOTE — Patient Instructions (Signed)
Medication Instructions:  Your physician recommends that you continue on your current medications as directed. Please refer to the Current Medication list given to you today.  *If you need a refill on your cardiac medications before your next appointment, please call your pharmacy*   Lab Work: Your physician recommends that you return for lab work today at [facility]   If you have labs (blood work) drawn today and your tests are completely normal, you will receive your results only by: MyChart Message (if you have MyChart) OR A paper copy in the mail If you have any lab test that is abnormal or we need to change your treatment, we will call you to review the results.   Testing/Procedures: NONE    Follow-Up: At [facility], you and your health needs are our priority.  As part of our continuing mission to provide you with exceptional heart care, we have created designated Provider Care Teams.  These Care Teams include your primary Cardiologist (physician) and Advanced Practice Providers (APPs -  Physician Assistants and Nurse Practitioners) who all work together to provide you with the care you need, when you need it.  We recommend signing up for the patient portal called "MyChart".  Sign up information is provided on this After Visit Summary.  MyChart is used to connect with patients for Virtual Visits (Telemedicine).  Patients are able to view lab/test results, encounter notes, upcoming appointments, etc.  Non-urgent messages can be sent to your provider as well.   To learn more about what you can do with MyChart, go to [email or web address].    Your next appointment:   6 month(s)  Provider:   Nona Dell, MD or Jacolyn Reedy, PA-C    Other Instructions Thank you for choosing [facility]!

## 2023-05-25 ENCOUNTER — Telehealth: Payer: Self-pay

## 2023-05-25 DIAGNOSIS — Z79899 Other long term (current) drug therapy: Secondary | ICD-10-CM

## 2023-05-25 NOTE — Telephone Encounter (Signed)
Patient notified and verbalized understanding. Pt had no questions or concerns at this time 

## 2023-05-25 NOTE — Telephone Encounter (Signed)
-----   Message from Jacolyn Reedy sent at 05/24/2023  2:43 PM EST ----- Potassium low and patient isn't taking it regularly. Needs to take 2 a day for 3 days then one daily. Repeat bmet in 1 week. Thanks. She is anemic and needs to f/u with PCP for this. Please send copy to PCP. thanks

## 2023-05-28 DIAGNOSIS — S01112A Laceration without foreign body of left eyelid and periocular area, initial encounter: Secondary | ICD-10-CM | POA: Diagnosis not present

## 2023-05-28 DIAGNOSIS — Z299 Encounter for prophylactic measures, unspecified: Secondary | ICD-10-CM | POA: Diagnosis not present

## 2023-05-28 DIAGNOSIS — I10 Essential (primary) hypertension: Secondary | ICD-10-CM | POA: Diagnosis not present

## 2023-05-28 DIAGNOSIS — I5032 Chronic diastolic (congestive) heart failure: Secondary | ICD-10-CM | POA: Diagnosis not present

## 2023-06-13 NOTE — Progress Notes (Deleted)
 Subjective:    Patient ID: Shelly Fields, female   DOB: [birth year],    MRN: [id number]    Brief patient profile:  76  yowf never smoked started with  Chronic cough which developed 12/2015 mostly with bending over with mostly productive mucus slt yellowish with steaks of blood while on xarelto  but for the most part no blood and w/u bronchiolectasis with  Dx ? MAI in [place] so referred to pulmonary clinic 03/22/2017 by Keavie Hairfield     History of Present Illness  03/22/2017 1st [place] [facility] office visit/ Jshawn Hurta   Chief Complaint  Patient presents with   Pulmonary Consult    Referred by Dr. Rosamond for eval of [facility]. Pt c/o occ dry cough.   most days x 15 months only spits up mucus (clears throat I don't cough )  in afternoons < tbsp yellowish thick mucus  Not limited by breathing from desired activities   Assoc with voice fatigue rec Pantoprazole  (protonix ) 40 mg   Take  30-60 min before first meal of the day and Pepcid  (famotidine )  20 mg one @  bedtime until return to office - this is the best way to tell whether stomach acid is contributing to your problem.   GERD (REFLUX)  Diet    05/13/2017  f/u ov/Karlon Schlafer re:   MAI/ cough has resolved on GERD rx  Not limited by breathing from desired activities   No am excess mucus rec No change in your medications until the pantaprazole (protonix )  expires (all the refills)  Then stop it Continue the diet and the pepcid  (famotidine ) at bedtime and when you run out of the pantaprazole refills then take the pepcid  after bfast also     02/13/2020  f/u ov/[place] office/Darnita Woodrum re: bronchiectasis / possible MAI  Chief Complaint  Patient presents with   Follow-up    Breathing has overall been doing well. She has some brown sputum- minimal some mornings.    Dyspnea:  MMRC1 = can walk nl pace, flat grade, can't hurry or go uphills or steps s sob   Cough: better overall / min darker just in am sev x a week Sleeping: bed blocks ok  SABA use:  no inhalers 02: none Rec If the mucus is getting nastier or more plentiful > levaquin  500 mg daily x 10 days - ok to call for refills   11/27/2020  f/u ov/[place] office/Christyna Letendre re: bronchiectasis  Chief Complaint  Patient presents with   Follow-up    Breathing is unchanged since the last visit. She is not coughing much and feels she is overall doing well.    Dyspnea:  MMRC1 = can walk nl pace, flat grade, can't hurry or go uphills or steps s sob   Cough: minimal  Sleeping: bed blocks  SABA use: none  02: none  Covid status: x 3 vax  Rec For nasty mucus > levaquin  500 mg daily x 10 days (keep on hand)   11/23/2022  Yearly f/u ov/[place] office/Malon Siddall re: bronchiectasis maint on no rx/ has prn levaquin  but not using   Chief Complaint  Patient presents with   Follow-up  Dyspnea: says no change -  MMRC2 = can't walk a nl pace on a flat grade s sob but does fine slow and flat eg [employer] shopping  Cough: none Sleeping: bed blocks > no resp cc  SABA use: none  02: none   Main concern is leg swelling R> L worse and needing extra dose  demadex  now more freqently Rec Ok to take an extra demadex  if needed for excessive leg swelling but don't do it every day and follow up as soon as you can with the heart clinic which is advising you on this long term My office will be contacting you by phone for referral for overnight pulse oximetry Cxr > bronchiectasis s change  Please schedule a follow up visit in 3 months but call sooner if needed     06/14/2023  f/u ov/[place] office/Zuhair Lariccia re: bronchiectasis maint on ***  No chief complaint on file.   Dyspnea:  *** Cough: *** Sleeping: ***   resp cc  SABA use: *** 02: ***  Lung cancer screening: ***   No obvious day to day or daytime variability or assoc excess/ purulent sputum or mucus plugs or hemoptysis or cp or chest tightness, subjective wheeze or overt sinus or hb symptoms.    Also denies any obvious fluctuation of symptoms with  weather or environmental changes or other aggravating or alleviating factors except as outlined above   No unusual exposure hx or h/o childhood pna/ asthma or knowledge of premature birth.  Current Allergies, Complete Past Medical History, Past Surgical History, Family History, and Social History were reviewed in [employer] record.  ROS  The following are not active complaints unless bolded Hoarseness, sore throat, dysphagia, dental problems, itching, sneezing,  nasal congestion or discharge of excess mucus or purulent secretions, ear ache,   fever, chills, sweats, unintended wt loss or wt gain, classically pleuritic or exertional cp,  orthopnea pnd or arm/hand swelling  or leg swelling, presyncope, palpitations, abdominal pain, anorexia, nausea, vomiting, diarrhea  or change in bowel habits or change in bladder habits, change in stools or change in urine, dysuria, hematuria,  rash, arthralgias, visual complaints, headache, numbness, weakness or ataxia or problems with walking or coordination,  change in mood or  memory.        No outpatient medications have been marked as taking for the 06/14/23 encounter (Appointment) with Darlean Ozell NOVAK, MD.                     Objective:   Physical Exam    Wts  06/14/2023         ***  11/23/2022       98 11/27/2021       99  11/27/2020       99  02/13/2020          100 11/28/2019       101 11/28/2018        97   02/11/2018          100  08/11/2017         102   05/13/2017      102   03/22/17 101 lb (45.8 kg)  03/18/17 101 lb (45.8 kg)  09/15/16 97 lb 12.8 oz (44.4 kg)     Vital signs reviewed  06/14/2023  - Note at rest 02 sats  ***% on ***   General appearance:    ***   Mod kyphotic contour chest  IRIR   pitting edema R > L LE  ***  Assessment:

## 2023-06-14 ENCOUNTER — Ambulatory Visit: Payer: Medicare Other | Admitting: Internal Medicine

## 2023-06-17 ENCOUNTER — Other Ambulatory Visit: Payer: Self-pay

## 2023-06-17 ENCOUNTER — Other Ambulatory Visit (HOSPITAL_COMMUNITY)
Admission: RE | Admit: 2023-06-17 | Discharge: 2023-06-17 | Disposition: A | Discharge status: Home / Self Care | Payer: Medicare Other | Source: Ambulatory Visit | Attending: Physician Assistant | Admitting: Physician Assistant

## 2023-06-17 ENCOUNTER — Telehealth: Payer: Self-pay

## 2023-06-17 DIAGNOSIS — Z79899 Other long term (current) drug therapy: Secondary | ICD-10-CM | POA: Insufficient documentation

## 2023-06-17 LAB — BASIC METABOLIC PANEL
Anion gap: 6 (ref 5–15)
BUN: 20 mg/dL (ref 8–23)
CO2: 27 mmol/L (ref 22–32)
Calcium: 8.9 mg/dL (ref 8.9–10.3)
Chloride: 95 mmol/L — ABNORMAL LOW (ref 98–111)
Creatinine, Ser: 0.87 mg/dL (ref 0.44–1.00)
GFR, Estimated: >60 mL/min (ref >60)
Glucose, Bld: 95 mg/dL (ref 70–99)
Potassium: 3.9 mmol/L (ref 3.5–5.1)
Sodium: 128 mmol/L — ABNORMAL LOW (ref 135–145)

## 2023-06-17 NOTE — Telephone Encounter (Signed)
-----   Message from [place] sent at 06/17/2023 12:27 PM EST ----- Covering for EMERSON Pavy, PA-C.  K+ is improved and normal. Kidney function (Creatinine) is normal. Sodium is chronically low and is somewhat lower compared to 3 weeks ago. Low sodium is almost always a sign of too much free water . She needs to restrict water  to get this down.  PLAN:  -Fluid restrict to 1500 mL per day (50 ounces). -Repeat BMET 1 week - place order under EMERSON Pavy, PA-C name. Glendia Ferrier, PA-C    06/17/2023 12:26 PM

## 2023-06-17 NOTE — Telephone Encounter (Signed)
 Results discussed with patient.She is well aware she has chronic low sodium. She agrees to water restrict to 50 ounces a day and repeat bmet at [facility] next week. Labs copied to pcp

## 2023-06-24 ENCOUNTER — Other Ambulatory Visit (HOSPITAL_COMMUNITY)
Admission: RE | Admit: 2023-06-24 | Discharge: 2023-06-24 | Disposition: A | Discharge status: Home / Self Care | Payer: Medicare Other | Source: Ambulatory Visit | Attending: Physician Assistant | Admitting: Physician Assistant

## 2023-06-24 DIAGNOSIS — Z79899 Other long term (current) drug therapy: Secondary | ICD-10-CM | POA: Insufficient documentation

## 2023-06-24 LAB — BASIC METABOLIC PANEL
Anion gap: 10 (ref 5–15)
BUN: 22 mg/dL (ref 8–23)
CO2: 30 mmol/L (ref 22–32)
Calcium: 9.9 mg/dL (ref 8.9–10.3)
Chloride: 96 mmol/L — ABNORMAL LOW (ref 98–111)
Creatinine, Ser: 0.94 mg/dL (ref 0.44–1.00)
GFR, Estimated: 59 mL/min — ABNORMAL LOW (ref >60)
Glucose, Bld: 88 mg/dL (ref 70–99)
Potassium: 3.8 mmol/L (ref 3.5–5.1)
Sodium: 136 mmol/L (ref 135–145)

## 2023-07-18 NOTE — Progress Notes (Signed)
 Subjective:     Patient ID: Shelly Fields, female   DOB: 06/28/1934,    MRN: [id number]    Brief patient profile:  7  yowf never smoked started with  Chronic cough which developed 12/2015 mostly with bending over with mostly productive mucus slt yellowish with steaks of blood while on xarelto  but for the most part no blood and w/u bronchiolectasis with  Dx ? MAI in [place] so referred to pulmonary clinic 03/22/2017 by Keavie Hairfield     History of Present Illness  03/22/2017 1st [place] [facility] office visit/ Esmirna Ravan   Chief Complaint  Patient presents with   Pulmonary Consult    Referred by Dr. Darien [place] for eval of [facility]. Pt c/o occ dry cough.   most days x 15 months only "spits up" mucus (clears throat "I don't cough" )  in afternoons < tbsp yellowish thick mucus  Not limited by breathing from desired activities   Assoc with voice fatigue rec Pantoprazole  (protonix ) 40 mg   Take  30-60 min before first meal of the day and Pepcid  (famotidine )  20 mg one @  bedtime until return to office - this is the best way to tell whether stomach acid is contributing to your problem.   GERD (REFLUX)  Diet    05/13/2017  f/u ov/Cleota Pellerito re:   MAI/ cough has resolved on GERD rx  Not limited by breathing from desired activities   No am excess mucus rec No change in your medications until the pantaprazole (protonix )  expires (all the refills)  Then stop it Continue the diet and the pepcid  (famotidine ) at bedtime and when you run out of the pantaprazole refills then take the pepcid  after bfast also     02/13/2020  f/u ov/[place] office/Rea Kalama re: bronchiectasis / possible MAI  Chief Complaint  Patient presents with   Follow-up    Breathing has overall been doing well. She has some brown sputum- minimal some mornings.    Dyspnea:  MMRC1 = can walk nl pace, flat grade, can't hurry or go uphills or steps s sob   Cough: better overall / min darker just in am sev x a week Sleeping: bed blocks ok  SABA  use: no inhalers 02: none Rec If the mucus is getting nastier or more plentiful > levaquin  500 mg daily x 10 days - ok to call for refills   11/27/2020  f/u ov/[place] office/Rhiley Solem re: bronchiectasis  Chief Complaint  Patient presents with   Follow-up    Breathing is unchanged since the last visit. She is not coughing much and feels she is overall doing well.    Dyspnea:  MMRC1 = can walk nl pace, flat grade, can't hurry or go uphills or steps s sob   Cough: minimal  Sleeping: bed blocks  SABA use: none  02: none  Covid status: x 3 vax  Rec For nasty mucus > levaquin  500 mg daily x 10 days (keep on hand)     11/23/2022  Yearly f/u ov/ office/Kazimierz Springborn re: bronchiectasis maint on no rx/ has prn levaquin  but not using   Chief Complaint  Patient presents with   Follow-up  Dyspnea: says no change -  MMRC2 = can't walk a nl pace on a flat grade s sob but does fine slow and flat eg [employer] shopping  Cough: none Sleeping: bed blocks > no resp cc  SABA use: none  02: none   Main concern is leg swelling R> L worse and  needing extra dose demadex  now more freqently  Rec Ok to take an extra demadex  if needed for excessive leg swelling but don't do it every day and follow up as soon as you can with the heart clinic which is advising you on this long term       07/19/2023  f/u ov/[place] office/Christobal Morado re: bronchiectasis  maint on prn levaquin    Chief Complaint  Patient presents with   Pulmonary hypertension  Dyspnea:  still able to walk at [employer]  Cough: minimal throat congestion minimally discolored / no worse inam  Sleeping: bed blocks thick pillow s    resp cc  SABA use: non 02: none      No obvious day to day or daytime variability or assoc excess/ purulent sputum or mucus plugs or hemoptysis or cp or chest tightness, subjective wheeze or overt sinus or hb symptoms.    Also denies any obvious fluctuation of symptoms with weather or environmental changes or other  aggravating or alleviating factors except as outlined above   No unusual exposure hx or h/o childhood pna/ asthma or knowledge of premature birth.  Current Allergies, Complete Past Medical History, Past Surgical History, Family History, and Social History were reviewed in [employer] record.  ROS  The following are not active complaints unless bolded Hoarseness, sore throat, dysphagia, dental problems, itching, sneezing,  nasal congestion or discharge of excess mucus or purulent secretions, ear ache,   fever, chills, sweats, unintended wt loss or wt gain, classically pleuritic or exertional cp,  orthopnea pnd or arm/hand swelling  or leg swelling, presyncope, palpitations, abdominal pain, anorexia, nausea, vomiting, diarrhea  or change in bowel habits or change in bladder habits, change in stools or change in urine, dysuria, hematuria,  rash, arthralgias, visual complaints, headache, numbness, weakness or ataxia or problems with walking or coordination,  change in mood or  memory.        Current Meds  Medication Sig   Carboxymethylcellulose Sodium (THERATEARS OP) Place 1 drop into both eyes daily.    dapagliflozin  propanediol (FARXIGA ) 10 MG TABS tablet Take 1 tablet (10 mg total) by mouth daily before breakfast.   denosumab  (PROLIA ) 60 MG/ML SOSY injection Inject 60 mg into the skin every 6 (six) months.   levothyroxine  (SYNTHROID ) 75 MCG tablet Take 75 mcg by mouth daily before breakfast.   metoprolol  succinate (TOPROL -XL) 50 MG 24 hr tablet TAKE 1 AND 1/2 TABLETS BY MOUTH  TWICE DAILY   potassium chloride  SA (KLOR-CON  M) 20 MEQ tablet Take 1 tablet (20 mEq total) by mouth daily.   psyllium (METAMUCIL) 58.6 % packet Take 1 packet by mouth 2 (two) times daily.   Rivaroxaban  (XARELTO ) 15 MG TABS tablet Take 1 tablet (15 mg total) by mouth daily with supper.   torsemide  (DEMADEX ) 20 MG tablet Take 1 tablet (20 mg total) by mouth daily.                    Objective:    Physical Exam    Wts  07/19/2023       95  11/23/2022       98 11/27/2021       99  11/27/2020       99  02/13/2020          100 11/28/2019       101 11/28/2018        97   02/11/2018          100  08/11/2017         102   05/13/2017      102   03/22/17 101 lb (45.8 kg)  03/18/17 101 lb (45.8 kg)  09/15/16 97 lb 12.8 oz (44.4 kg)     Vital signs reviewed  07/19/2023  - Note at rest 02 sats  97% on RA   General appearance:    elderly wf very hard of hearing       HEENT : Oropharynx  clear      Nasal turbinates nl    NECK :  without  apparent JVD/ palpable Nodes/TM    LUNGS: no acc muscle use, Mod kyphotic contour chest which is clear to A and P bilaterally without cough on insp or exp maneuvers   CV:  IRIR no s3  2/6 SEM  c slt increase in P2, and  pitting edema R> LLE despite elastic hose  ABD:  soft and nontender   MS:  Gait nl   ext warm without deformities Or obvious joint restrictions  calf tenderness, cyanosis or clubbing    SKIN: warm and dry without lesions    NEURO:  alert, approp, nl sensorium with  no motor or cerebellar deficits apparent.       Assessment:

## 2023-07-19 ENCOUNTER — Ambulatory Visit: Payer: Medicare Other | Admitting: Internal Medicine

## 2023-07-19 ENCOUNTER — Encounter: Payer: Self-pay | Admitting: Internal Medicine

## 2023-07-19 VITALS — BP 136/79 | HR 80 | Ht 62.0 in | Wt 95.0 lb

## 2023-07-19 DIAGNOSIS — I272 Pulmonary hypertension, unspecified: Secondary | ICD-10-CM

## 2023-07-19 DIAGNOSIS — J479 Bronchiectasis, uncomplicated: Secondary | ICD-10-CM | POA: Diagnosis not present

## 2023-07-19 NOTE — Patient Instructions (Addendum)
 My office will be contacting you by phone for referral to pulse oximetryy on RA  - if you don't hear back from my office within one week please call us  back or notify us  thru MyChart and we'll address it right away.    Remember to take your levaquin  if even mucus gets nasty colored or bloody then call me if it doesn't clear    Please schedule a follow up visit in 6 months but call sooner if needed

## 2023-07-19 NOTE — Assessment & Plan Note (Signed)
 Onset of cough 2017/never smoker  Chest CT  01/06/17  C/w MAI with "bronciolectasis" L > R bases / no real change since  02/11/16  but new findings c/w PH - PFT's  05/13/2017  FEV1 1.54 (85 % ) ratio 64   p nothing prior to study with DLCO  59/63c % corrects to 103 % for alv volume   - CT 08/23/18 no change  - CT chest 12/28/2019 1. Similar appearance of cylindrical bronchiectasis in the right middle lobe with associated airway impaction and clustered micro nodularity. 2. Similar appearance of cylindrical bronchiectasis in the left lower lobe although there is more pronounced airway impaction in the posterior left lower lobe today than on the previous exam. cxr 11/27/2020 no change bilateral scarring   Well compensated s need bronchodilators or even rx with abx since last ov

## 2023-07-19 NOTE — Assessment & Plan Note (Signed)
 Echocardiogram 02/01/2017 [facility]): Normal LV wall thickness with LVEF 65-70%, normal left atrial chamber size, moderate right atrial enlargement, mildly calcified aortic valve with mild aortic regurgitation, mild mitral annular calcification with mild mitral regurgitation, severe tricuspid regurgitation with RVSP 30-40 mmHg ECHO  09/08/17   - Left ventricle:  Normal but  the study was not technically sufficient to allow evaluation of LV diastolic dysfunction due to AF - Aortic valve: There was mild regurgitation. - Mitral valve: There was mild to moderate regurgitation. - Left atrium: The atrium was severely dilated. - Right atrium: The atrium was massively dilated. - Tricuspid valve: There was severe regurgitation. - Systemic veins: IVC is dilated with normal respiratory variation.  Estimated CVP 8 mmHg. Echo 02/25/22 1. LV ok x there is interventricular septum is flattened in systole and diastole, consistent with right ventricular pressure and volume overload. The average left  ventricular global longitudinal strain is -19.8 %. The global longitudinal  strain is normal.   2. Right ventricular systolic function is normal. The right ventricular  size is severely enlarged. There is mildly elevated pulmonary artery  systolic pressure. The estimated right ventricular systolic pressure is  36.3 mmHg.   3. Left atrial size was moderately dilated.   4. Right atrial size was markedly dilated.   5. A small pericardial effusion is present. The pericardial effusion is  posterior to the left ventricle.   6. The mitral valve is abnormal. Mild mitral valve regurgitation.   7. Tricuspid valve regurgitation is severe.   8. The aortic valve is tricuspid. Aortic valve regurgitation is mild.  Aortic regurgitation PHT measures 628 msec.   9. The inferior vena cava is dilated in size with <50% respiratory  variability, suggesting right atrial pressure of 15 mmHg.   Comparison(s): Prior images  reviewed side by side. LVEF remains normal  range wtih LV septal flattening consistent with RV volume overload mainly.  There is severe RV enlargement and severe tricuspid regurgitation with  mildly estimated RVSP 36 mmHg.  - ONO RA  11/25/22  desast 1h 23 min  - 07/19/2023   Walked on RA  x  3  lap(s) =  approx 450  ft  @ mod pace, stopped due to end of study  with lowest 02 sats 97% s sob   - Re check ONO on RA 07/19/2023 >>>   Unclear why she wasn't started on  02 given evidence of cor pulmonale but will need to have repeat ONO now to see if still needs it.  Discussed in detail all the  indications, usual  risks and alternatives  relative to the benefits with patient who agrees to proceed with w/u as outlined.            Each maintenance medication was reviewed in detail including emphasizing most importantly the difference between maintenance and prns and under what circumstances the prns are to be triggered using an action plan format where appropriate.  Total time for H and P, chart review, counseling,  directly observing portions of ambulatory 02 saturation study/ and generating customized AVS unique to this office visit / same day charting = 31 min

## 2023-07-22 DIAGNOSIS — I10 Essential (primary) hypertension: Secondary | ICD-10-CM | POA: Diagnosis not present

## 2023-07-22 DIAGNOSIS — Z7189 Other specified counseling: Secondary | ICD-10-CM | POA: Diagnosis not present

## 2023-07-22 DIAGNOSIS — Z0000 Encounter for general adult medical examination without abnormal findings: Secondary | ICD-10-CM | POA: Diagnosis not present

## 2023-07-22 DIAGNOSIS — R5383 Other fatigue: Secondary | ICD-10-CM | POA: Diagnosis not present

## 2023-07-22 DIAGNOSIS — E039 Hypothyroidism, unspecified: Secondary | ICD-10-CM | POA: Diagnosis not present

## 2023-07-22 DIAGNOSIS — E7800 Pure hypercholesterolemia, unspecified: Secondary | ICD-10-CM | POA: Diagnosis not present

## 2023-07-22 DIAGNOSIS — Z299 Encounter for prophylactic measures, unspecified: Secondary | ICD-10-CM | POA: Diagnosis not present

## 2023-07-26 DIAGNOSIS — Z79899 Other long term (current) drug therapy: Secondary | ICD-10-CM | POA: Diagnosis not present

## 2023-07-26 DIAGNOSIS — R5383 Other fatigue: Secondary | ICD-10-CM | POA: Diagnosis not present

## 2023-07-26 DIAGNOSIS — E559 Vitamin D deficiency, unspecified: Secondary | ICD-10-CM | POA: Diagnosis not present

## 2023-07-26 DIAGNOSIS — E039 Hypothyroidism, unspecified: Secondary | ICD-10-CM | POA: Diagnosis not present

## 2023-07-26 DIAGNOSIS — D509 Iron deficiency anemia, unspecified: Secondary | ICD-10-CM | POA: Diagnosis not present

## 2023-07-26 DIAGNOSIS — E7800 Pure hypercholesterolemia, unspecified: Secondary | ICD-10-CM | POA: Diagnosis not present

## 2023-08-03 DIAGNOSIS — I10 Essential (primary) hypertension: Secondary | ICD-10-CM | POA: Diagnosis not present

## 2023-08-03 DIAGNOSIS — D1721 Benign lipomatous neoplasm of skin and subcutaneous tissue of right arm: Secondary | ICD-10-CM | POA: Diagnosis not present

## 2023-08-03 DIAGNOSIS — D4819 Other specified neoplasm of uncertain behavior of connective and other soft tissue: Secondary | ICD-10-CM | POA: Diagnosis not present

## 2023-08-03 DIAGNOSIS — D485 Neoplasm of uncertain behavior of skin: Secondary | ICD-10-CM | POA: Diagnosis not present

## 2023-08-03 DIAGNOSIS — D1723 Benign lipomatous neoplasm of skin and subcutaneous tissue of right leg: Secondary | ICD-10-CM | POA: Diagnosis not present

## 2023-08-03 DIAGNOSIS — C44722 Squamous cell carcinoma of skin of right lower limb, including hip: Secondary | ICD-10-CM | POA: Diagnosis not present

## 2023-08-03 DIAGNOSIS — Z299 Encounter for prophylactic measures, unspecified: Secondary | ICD-10-CM | POA: Diagnosis not present

## 2023-08-18 ENCOUNTER — Other Ambulatory Visit: Payer: Self-pay | Admitting: Cardiology

## 2023-08-18 DIAGNOSIS — I4819 Other persistent atrial fibrillation: Secondary | ICD-10-CM

## 2023-08-18 NOTE — Telephone Encounter (Signed)
 Prescription refill request for Xarelto received.  Indication: AF Last office visit: 05/24/23  Leda Gauze PA-C Weight: 42.3kg Age: 89 Scr: 0.94  CrCl: 28.69  Based on above findings Xarelto 15mg  daily is the appropriate dose.  Refill approved.

## 2023-08-20 ENCOUNTER — Other Ambulatory Visit: Payer: Self-pay | Admitting: *Deleted

## 2023-08-20 DIAGNOSIS — C4492 Squamous cell carcinoma of skin, unspecified: Secondary | ICD-10-CM

## 2023-08-24 ENCOUNTER — Encounter: Payer: Self-pay | Admitting: Surgery

## 2023-08-24 ENCOUNTER — Encounter: Payer: Self-pay | Admitting: *Deleted

## 2023-08-24 ENCOUNTER — Telehealth: Payer: Self-pay

## 2023-08-24 ENCOUNTER — Ambulatory Visit: Admitting: Surgery

## 2023-08-24 VITALS — BP 133/54 | HR 69 | Temp 97.1°F | Resp 14 | Ht 62.0 in | Wt 99.8 lb

## 2023-08-24 DIAGNOSIS — I509 Heart failure, unspecified: Secondary | ICD-10-CM | POA: Diagnosis not present

## 2023-08-24 DIAGNOSIS — I480 Paroxysmal atrial fibrillation: Secondary | ICD-10-CM | POA: Diagnosis not present

## 2023-08-24 DIAGNOSIS — C4492 Squamous cell carcinoma of skin, unspecified: Secondary | ICD-10-CM

## 2023-08-24 NOTE — Telephone Encounter (Signed)
   Pre-operative Risk Assessment    Patient Name: Shelly Fields  DOB: 02-23-1935 MRN: [id number]   Date of last office visit: 05/24/23 Date of next office visit: 11/24/23   Request for Surgical Clearance    Procedure:   Excision Malignant Lesion, Right Lower Extremity 1.2 CM (Squamous Cell carcinoma)   Date of Surgery:  Clearance 09/13/23                                 Surgeon:  Theophilus Kinds, DO  Surgeon's Group or Practice Name:  [facility]  Phone number:  [phone] Fax number:  [phone]   Type of Clearance Requested:   - Medical  - Pharmacy:  Hold Apixaban (Eliquis) and [name]   Eliquis hold x2 days and Farxiga hold x3 days    Type of Anesthesia:   Choice    Additional requests/questions:    [name]   08/24/2023, 3:18 PM

## 2023-08-25 ENCOUNTER — Telehealth: Payer: Self-pay | Admitting: *Deleted

## 2023-08-25 NOTE — Telephone Encounter (Signed)
 Pt has appt tomorrow 08/26/23 with Dr. Diona Browner, see notes. I will update all parties involved.

## 2023-08-25 NOTE — Telephone Encounter (Signed)
   Name: Shelly Fields  DOB: 1935-05-12  MRN: [id number]  Primary Cardiologist: Nona Dell, MD   Preoperative team, please contact this patient and set up a phone call appointment for further preoperative risk assessment. Please obtain consent and complete medication review. Thank you for your help.  I confirm that guidance regarding antiplatelet and oral anticoagulation therapy has been completed and, if necessary, noted below.  Per office protocol, patient can hold Xarelto for 2 days prior to procedure.  Patient's [name] may be held for 3 days prior to her procedure.  I also confirmed the patient resides in the state of [place]. As per [facility] Medical Board telemedicine laws, the patient must reside in the state in which the provider is licensed.   Ronney Asters, NP 08/25/2023, 11:02 AM [facility]

## 2023-08-25 NOTE — Telephone Encounter (Signed)
-----   Message from [facility] sent at 08/25/2023  1:54 PM EDT ----- Regarding: RE: SEE NOTES PT HAS FLUID AND NEEDS PREOP APPT Attempted to contact patient- left a message for her to call office back regarding swelling. ----- Message ----- From: Seymour Bars Sent: 08/25/2023  12:44 PM EDT To: Eustace Moore, RN; Tarri Fuller, CMA; # Subject: FW: SEE NOTES PT HAS FLUID AND NEEDS PREOP A#  Good afternoon. I am sending to you two because Dr. Diona Browner is her provider. Taking a look at our schedule 10/25/2023 is our first available appt. Is there anything we can do other than adding her to the wait list?  Thanks,  Marchelle Folks ----- Message ----- From: Tarri Fuller, CMA Sent: 08/25/2023  11:32 AM EDT To: [facility] Scheduling Subject: SEE NOTES PT HAS FLUID AND NEEDS PREOP APPT    S/w the pt about tele preop appt. Pt tells me she has gained some fluid and her weight yesterday 08/24/23 was 96.6 lb. She tells me that she took 2 torsemide, 20 mg tabs yesterday on her own accord and that her weight today is 95.4 lb. Pt asked should she continue 2 torsemide for a few days. I said I cannot make that recommendation and that I will forward note to MD and nurse. Pt request to be see in office instead due to fluid build up and HOH.    Will forward to preop app to be sure in agreement with In office appt as opposed to tele. I will have [place] sched team call pt with an appt.

## 2023-08-25 NOTE — Telephone Encounter (Signed)
 Patient with diagnosis of A Fib on Xarelto for anticoagulation.    Procedure: Excision Malignant Lesion, Right Lower Extremity 1.2 CM (Squamous Cell carcinoma)  Date of procedure: 09/13/23   CHA2DS2-VASc Score = 5  This indicates a 7.2% annual risk of stroke. The patient's score is based upon: CHF History: 1 HTN History: 1 Diabetes History: 0 Stroke History: 0 Vascular Disease History: 0 Age Score: 2 Gender Score: 1    CrCl 30 ml/min Platelet count 342K   Per office protocol, patient can hold Xarelto for 2 days prior to procedure.    **This guidance is not considered finalized until pre-operative APP has relayed final recommendations.**

## 2023-08-25 NOTE — Telephone Encounter (Signed)
 Agree, based on patient's recent swelling/fluid accumulation she will need an office appointment for further evaluation and recommendations.  Preoperative team, thank you for your help.  Please change patient's appointment to in office.  Thank you.  Thomasene Ripple. Marvon Shillingburg NP-C     08/25/2023, 11:47 AM [facility] [address] Office [phone] Fax [phone]

## 2023-08-25 NOTE — Telephone Encounter (Signed)
 S/w the pt about tele preop appt. Pt tells me she has gained some fluid and her weight yesterday 08/24/23 was 96.6 lb. She tells me that she took 2 torsemide, 20 mg tabs yesterday on her own accord and that her weight today is 95.4 lb. Pt asked should she continue 2 torsemide for a few days. I said I cannot make that recommendation and that I will forward note to MD and nurse. Pt request to be see in office instead due to fluid build up and HOH.   Will forward to preop app to be sure in agreement with In office appt as opposed to tele. I will have [place] sched team call pt with an appt.

## 2023-08-26 ENCOUNTER — Encounter: Payer: Self-pay | Admitting: Cardiology

## 2023-08-26 ENCOUNTER — Ambulatory Visit: Attending: Cardiology | Admitting: Cardiology

## 2023-08-26 VITALS — BP 112/76 | HR 78 | Ht 62.0 in | Wt 98.6 lb

## 2023-08-26 DIAGNOSIS — I5032 Chronic diastolic (congestive) heart failure: Secondary | ICD-10-CM | POA: Diagnosis not present

## 2023-08-26 DIAGNOSIS — I4821 Permanent atrial fibrillation: Secondary | ICD-10-CM

## 2023-08-26 DIAGNOSIS — Z01810 Encounter for preprocedural cardiovascular examination: Secondary | ICD-10-CM | POA: Diagnosis not present

## 2023-08-26 MED ORDER — TORSEMIDE 20 MG PO TABS: ORAL_TABLET | ORAL | 3 refills | Status: DC

## 2023-08-26 NOTE — Patient Instructions (Signed)
 Medication Instructions:   Take Torsemide 40 mg daily alternating with 20 mg daily   *If you need a refill on your cardiac medications before your next appointment, please call your pharmacy*   Lab Work: NONE   If you have labs (blood work) drawn today and your tests are completely normal, you will receive your results only by: MyChart Message (if you have MyChart) OR A paper copy in the mail If you have any lab test that is abnormal or we need to change your treatment, we will call you to review the results.   Testing/Procedures: NONE    Follow-Up: At [facility], you and your health needs are our priority.  As part of our continuing mission to provide you with exceptional heart care, we have created designated Provider Care Teams.  These Care Teams include your primary Cardiologist (physician) and Advanced Practice Providers (APPs -  Physician Assistants and Nurse Practitioners) who all work together to provide you with the care you need, when you need it.  We recommend signing up for the patient portal called "MyChart".  Sign up information is provided on this After Visit Summary.  MyChart is used to connect with patients for Virtual Visits (Telemedicine).  Patients are able to view lab/test results, encounter notes, upcoming appointments, etc.  Non-urgent messages can be sent to your provider as well.   To learn more about what you can do with MyChart, go to [email or web address].    Your next appointment:    June   Provider:   Nona Dell, MD    Other Instructions Thank you for choosing [facility]!

## 2023-08-26 NOTE — Progress Notes (Signed)
 Cardiology Office Note  Date: 08/26/2023   ID: Malayzia, Laforte 1935/05/26, MRN [id number]  History of Present Illness: Shelly Fields is an 89 y.o. female last seen in December 2024 by Ms. Lilla Shook, I reviewed the note.  She is here today for follow-up of intermittent leg edema and also for preprocedure cardiac assessment.  Reports relative weight gain of about 3 pounds over a period of weeks, leg edema fluctuates as before and she typically uses compression stockings each morning.  She has taken an additional Demadex 20 mg tablet for 5 times in the last few weeks.  She tries to stay active with ADLs, decrease in stamina has been gradual.  She does not report any chest pain or palpitations.  I reviewed her interval lab work from PCP done in February as noted below.  She is scheduled for skin cancer surgery in early April with Dr. Robyne Peers.  She has a squamous cell carcinoma on her right leg.  Type of anesthesia is not yet defined.  It is requested that she hold Xarelto and [place] as discussed below.  I reviewed the remainder of her medications.  She reports compliance.  No spontaneous bleeding problems other than lower extremity venous bleeds that occur from time to time.  She has fairly prominent varicosities distally.  Physical Exam: VS:  BP 112/76   Pulse 78   Ht 5\' 2"  (1.575 m)   Wt 98 lb 9.6 oz (44.7 kg)   SpO2 98%   BMI 18.03 kg/m , BMI Body mass index is 18.03 kg/m.  Wt Readings from Last 3 Encounters:  08/26/23 98 lb 9.6 oz (44.7 kg)  08/24/23 99 lb 12.8 oz (45.3 kg)  07/19/23 95 lb (43.1 kg)    General: Patient appears comfortable at rest. HEENT: Conjunctiva and lids normal. Neck: Supple, no elevated JVP or carotid bruits. Lungs: Clear to auscultation, nonlabored breathing at rest. Cardiac: Irregular irregular, 2/6 systolic murmur, no gallop. Extremities: Chronic appearing lower leg edema and venous varicosities as before..  ECG:  An ECG dated 03/23/2023 was  personally reviewed today and demonstrated:  Atrial fibrillation with R' lead V1 and V2, nonspecific ST changes, heart rate 70 bpm.  Labwork: 05/24/2023: ALT 17; AST 26; Hemoglobin 10.1; Platelets 342 06/24/2023: BUN 22; Creatinine, Ser 0.94; Potassium 3.8; Sodium 136  February 2025: Hemoglobin 9.2, platelets 304, BUN 22, creatinine 0.96, potassium 4.2, AST 19, ALT 13, cholesterol 120, triglycerides 78, HDL 48, LDL 56, TSH 4.73  Other Studies Reviewed Today:  No interval cardiac testing for review today.  Assessment and Plan:  1.  Permanent atrial fibrillation with CHA2DS2-VASc score of 4.  Heart rate in the 70s to 80s today at rest and she reportsregular sense of palpitations.  Continues on Xarelto 15 mg daily for stroke prophylaxis, also Toprol-XL 75 mg twice daily for heart rate control.   2.  HFpEF with LVEF 60 to 65%, severely dilated RV with mildly elevated PASP 36 mmHg by echocardiogram in September 2023.  She uses compression stockings.  Plan to increase Demadex to 20 mg alternating with 40 mg every other day and continue current potassium supplement.  She is also on Farxiga 10 mg daily.   3.  Primary hypertension.  Blood pressure is well-controlled.   4.  Preoperative cardiac evaluation.  She is to undergo excision of a squamous cell carcinoma on her right leg, requested to hold anticoagulation and Farxiga.  Recommend holding Xarelto 48 hours prior to procedure, can hold  Farxiga 72 hours prior to procedure.  Disposition:  Follow up in June as scheduled.  Signed, Jonelle Sidle, M.D., F.A.C.C. [facility]

## 2023-08-26 NOTE — Telephone Encounter (Signed)
 Pt seen in office today.

## 2023-08-26 NOTE — Progress Notes (Signed)
 DR. McDOWELL OV NOTES SENT TO SURGEON OFFICE

## 2023-08-27 NOTE — H&P (Signed)
 [facility] History and Physical  Reason for Referral: Right lower extremity SCC Referring Physician: Dr. Sherril Croon  Chief Complaint   New Patient (Initial Visit)     Shelly Fields is a 89 y.o. female.  HPI: Patient presents for evaluation of a right lower extremity squamous cell carcinoma.  She was at a wellness check with her primary care physician, at which time she was noted to have a lesion on her right lower extremity.  At that time it was biopsied.  Since the biopsy, she denies any significant issues related to the wound.  Pathology from the biopsy site demonstrated well-differentiated squamous cell carcinoma.  Patient has had multiple skin lesions removed in the past, which she believes could have been skin cancer, though she is unsure.  Her past medical history is significant for CHF on Farxiga, atrial fibrillation on Xarelto, hypothyroidism, and hypertension.  Her surgical history significant for an abdominal hysterectomy.  She denies use of tobacco products, alcohol, and illicit drugs.  Past Medical History:  Diagnosis Date   Cor pulmonale (chronic) (HCC)    Diverticulosis    Essential hypertension    Hypothyroidism    Internal hemorrhoids    Persistent atrial fibrillation (HCC)    Pulmonary hypertension (HCC)    Seasonal allergies    Shingles     Past Surgical History:  Procedure Laterality Date   ABDOMINAL HYSTERECTOMY     BIOPSY  03/25/2023   Procedure: BIOPSY;  Surgeon: Franky Macho, MD;  [facility]: [facility];  Service: Endoscopy;;   CATARACT EXTRACTION     COLONOSCOPY N/A 01/12/2013   Procedure: COLONOSCOPY;  Surgeon: Malissa Hippo, MD;  [facility]: [facility];  Service: Endoscopy;  Laterality: N/A;  1030   COLONOSCOPY N/A 11/18/2017   Procedure: COLONOSCOPY;  Surgeon: Malissa Hippo, MD;  [facility];  Service: Endoscopy;  Laterality: N/A;   COLONOSCOPY WITH PROPOFOL N/A 03/25/2023   Procedure: COLONOSCOPY WITH PROPOFOL;   Surgeon: Franky Macho, MD;  [facility];  Service: Endoscopy;  Laterality: N/A;  11:00AM;ASA 3   ESOPHAGOGASTRODUODENOSCOPY (EGD) WITH PROPOFOL N/A 03/25/2023   Procedure: ESOPHAGOGASTRODUODENOSCOPY (EGD) WITH PROPOFOL;  Surgeon: Franky Macho, MD;  [facility];  Service: Endoscopy;  Laterality: N/A;  11:00;ASA 3   POLYPECTOMY  11/18/2017   Procedure: POLYPECTOMY;  Surgeon: Malissa Hippo, MD;  [facility];  Service: Endoscopy;;  colon    Family History  Problem Relation Age of Onset   Heart disease Mother    Hypertension Father    Colon cancer Neg Hx     Social History   Tobacco Use   Smoking status: Never    Passive exposure: Past   Smokeless tobacco: Never  Vaping Use   Vaping status: Never Used  Substance Use Topics   Alcohol use: No    Alcohol/week: 0.0 standard drinks of alcohol   Drug use: No    Medications: I have reviewed the patient's current medications. Allergies as of 08/24/2023   No Known Allergies      Medication List        Accurate as of August 24, 2023 11:59 PM. If you have any questions, ask your nurse or doctor.          dapagliflozin propanediol 10 MG Tabs tablet Commonly known as: Farxiga Take 1 tablet (10 mg total) by mouth daily before breakfast.   denosumab 60 MG/ML Sosy injection Commonly known as: PROLIA Inject 60  mg into the skin every 6 (six) months.   ferrous gluconate 324 MG tablet Commonly known as: FERGON Take 1 tablet (324 mg total) by mouth every other day.   levothyroxine 75 MCG tablet Commonly known as: SYNTHROID Take 75 mcg by mouth daily before breakfast.   metoprolol succinate 50 MG 24 hr tablet Commonly known as: TOPROL-XL TAKE 1 AND 1/2 TABLETS BY MOUTH  TWICE DAILY   potassium chloride SA 20 MEQ tablet Commonly known as: KLOR-CON M Take 1 tablet (20 mEq total) by mouth daily.   psyllium 58.6 % packet Commonly known as: METAMUCIL Take 1 packet by mouth 2 (two)  times daily.   THERATEARS OP Place 1 drop into both eyes daily.   torsemide 20 MG tablet Commonly known as: DEMADEX Take 1 tablet (20 mg total) by mouth daily.   Xarelto 15 MG Tabs tablet Generic drug: Rivaroxaban TAKE 1 TABLET BY MOUTH DAILY  WITH SUPPER         ROS:  Constitutional: negative for chills, fatigue, and fevers Eyes: negative for visual disturbance and pain Ears, nose, mouth, throat, and face: negative for ear drainage, sore throat, and sinus problems Respiratory: positive for shortness of breath, negative for cough and wheezing Cardiovascular: negative for chest pain and palpitations Gastrointestinal: negative for abdominal pain, nausea, reflux symptoms, and vomiting Genitourinary:negative for dysuria and frequency Integument/breast: positive for dryness, negative for rash Hematologic/lymphatic: negative for bleeding and lymphadenopathy Musculoskeletal:negative for back pain and neck pain Neurological: negative for dizziness and tremors Endocrine: negative for temperature intolerance  Blood pressure (!) 133/54, pulse 69, temperature (!) 97.1 F (36.2 C), temperature source Oral, resp. rate 14, height 5\' 2"  (1.575 m), weight 99 lb 12.8 oz (45.3 kg), SpO2 99%. Physical Exam Vitals reviewed.  Constitutional:      Appearance: Normal appearance.  HENT:     Head: Normocephalic and atraumatic.  Eyes:     Extraocular Movements: Extraocular movements intact.     Pupils: Pupils are equal, round, and reactive to light.  Cardiovascular:     Rate and Rhythm: Normal rate. Rhythm irregular.  Pulmonary:     Effort: Pulmonary effort is normal.     Breath sounds: Normal breath sounds.  Abdominal:     General: There is no distension.     Palpations: Abdomen is soft.     Tenderness: There is no abdominal tenderness.  Musculoskeletal:        General: Normal range of motion.     Cervical back: Normal range of motion.  Skin:    General: Skin is warm and dry.      Comments: Right anterior shin with 1 cm x 1.2 cm lesion with overlying scab, no surrounding erythema or induration; nontender to palpation  Neurological:     General: No focal deficit present.     Mental Status: She is alert and oriented to person, place, and time.  Psychiatric:        Mood and Affect: Mood normal.        Behavior: Behavior normal.     Results: Pathology (08/03/23): 1.  Skin, right leg, biopsy Well-differentiated squamous cell carcinoma   Assessment & Plan:  Shelly Fields is a 89 y.o. female who presents for evaluation of a right lower extremity squamous cell carcinoma.  -We discussed her pathology finding demonstrating well-differentiated squamous cell carcinoma.  We discussed that the next step in recommendation would be for wide excision of this area.  The recommendation is for 1/2 cm margin  around squamous cell carcinoma -The risk and benefits of excision of the right lower extremity squamous cell carcinoma were discussed including but not limited to bleeding, infection, need for additional procedures, postoperative seroma, and injury to surrounding structures.  After careful consideration, Shelly Fields has decided to proceed with surgery. -Patient tentatively scheduled for surgery on 4/7 -Will reach out to the patient's cardiologist given her CHF.  Both the patient's [name] and Xarelto will need to be held preoperatively. -Information provided to the patient on squamous cell carcinoma  All questions were answered to the satisfaction of the patient.  Note: Portions of this report may have been transcribed using voice recognition software. Every effort has been made to ensure accuracy; however, inadvertent computerized transcription errors may still be present.   Theophilus Kinds, DO [facility] [address] [place], [place] [address] [phone] (office)

## 2023-08-27 NOTE — Progress Notes (Signed)
 [facility] History and Physical  Reason for Referral: Right lower extremity SCC Referring Physician: Dr. Sherril Croon  Chief Complaint   New Patient (Initial Visit)     Shelly Fields is a 89 y.o. female.  HPI: Patient presents for evaluation of a right lower extremity squamous cell carcinoma.  She was at a wellness check with her primary care physician, at which time she was noted to have a lesion on her right lower extremity.  At that time it was biopsied.  Since the biopsy, she denies any significant issues related to the wound.  Pathology from the biopsy site demonstrated well-differentiated squamous cell carcinoma.  Patient has had multiple skin lesions removed in the past, which she believes could have been skin cancer, though she is unsure.  Her past medical history is significant for CHF on Farxiga, atrial fibrillation on Xarelto, hypothyroidism, and hypertension.  Her surgical history significant for an abdominal hysterectomy.  She denies use of tobacco products, alcohol, and illicit drugs.  Past Medical History:  Diagnosis Date   Cor pulmonale (chronic) (HCC)    Diverticulosis    Essential hypertension    Hypothyroidism    Internal hemorrhoids    Persistent atrial fibrillation (HCC)    Pulmonary hypertension (HCC)    Seasonal allergies    Shingles     Past Surgical History:  Procedure Laterality Date   ABDOMINAL HYSTERECTOMY     BIOPSY  03/25/2023   Procedure: BIOPSY;  Surgeon: Franky Macho, MD;  [facility];  Service: Endoscopy;;   CATARACT EXTRACTION     COLONOSCOPY N/A 01/12/2013   Procedure: COLONOSCOPY;  Surgeon: Malissa Hippo, MD;  [facility];  Service: Endoscopy;  Laterality: N/A;  1030   COLONOSCOPY N/A 11/18/2017   Procedure: COLONOSCOPY;  Surgeon: Malissa Hippo, MD;  [facility];  Service: Endoscopy;  Laterality: N/A;   COLONOSCOPY WITH PROPOFOL N/A 03/25/2023   Procedure: COLONOSCOPY WITH PROPOFOL;   Surgeon: Franky Macho, MD;  [facility];  Service: Endoscopy;  Laterality: N/A;  11:00AM;ASA 3   ESOPHAGOGASTRODUODENOSCOPY (EGD) WITH PROPOFOL N/A 03/25/2023   Procedure: ESOPHAGOGASTRODUODENOSCOPY (EGD) WITH PROPOFOL;  Surgeon: Franky Macho, MD;  [facility];  Service: Endoscopy;  Laterality: N/A;  11:00;ASA 3   POLYPECTOMY  11/18/2017   Procedure: POLYPECTOMY;  Surgeon: Malissa Hippo, MD;  [facility];  Service: Endoscopy;;  colon    Family History  Problem Relation Age of Onset   Heart disease Mother    Hypertension Father    Colon cancer Neg Hx     Social History   Tobacco Use   Smoking status: Never    Passive exposure: Past   Smokeless tobacco: Never  Vaping Use   Vaping status: Never Used  Substance Use Topics   Alcohol use: No    Alcohol/week: 0.0 standard drinks of alcohol   Drug use: No    Medications: I have reviewed the patient's current medications. Allergies as of 08/24/2023   No Known Allergies      Medication List        Accurate as of August 24, 2023 11:59 PM. If you have any questions, ask your nurse or doctor.          dapagliflozin propanediol 10 MG Tabs tablet Commonly known as: Farxiga Take 1 tablet (10 mg total) by mouth daily before breakfast.   denosumab 60 MG/ML Sosy injection Commonly known as: PROLIA Inject 60  mg into the skin every 6 (six) months.   ferrous gluconate 324 MG tablet Commonly known as: FERGON Take 1 tablet (324 mg total) by mouth every other day.   levothyroxine 75 MCG tablet Commonly known as: SYNTHROID Take 75 mcg by mouth daily before breakfast.   metoprolol succinate 50 MG 24 hr tablet Commonly known as: TOPROL-XL TAKE 1 AND 1/2 TABLETS BY MOUTH  TWICE DAILY   potassium chloride SA 20 MEQ tablet Commonly known as: KLOR-CON M Take 1 tablet (20 mEq total) by mouth daily.   psyllium 58.6 % packet Commonly known as: METAMUCIL Take 1 packet by mouth 2 (two)  times daily.   THERATEARS OP Place 1 drop into both eyes daily.   torsemide 20 MG tablet Commonly known as: DEMADEX Take 1 tablet (20 mg total) by mouth daily.   Xarelto 15 MG Tabs tablet Generic drug: Rivaroxaban TAKE 1 TABLET BY MOUTH DAILY  WITH SUPPER         ROS:  Constitutional: negative for chills, fatigue, and fevers Eyes: negative for visual disturbance and pain Ears, nose, mouth, throat, and face: negative for ear drainage, sore throat, and sinus problems Respiratory: positive for shortness of breath, negative for cough and wheezing Cardiovascular: negative for chest pain and palpitations Gastrointestinal: negative for abdominal pain, nausea, reflux symptoms, and vomiting Genitourinary:negative for dysuria and frequency Integument/breast: positive for dryness, negative for rash Hematologic/lymphatic: negative for bleeding and lymphadenopathy Musculoskeletal:negative for back pain and neck pain Neurological: negative for dizziness and tremors Endocrine: negative for temperature intolerance  Blood pressure (!) 133/54, pulse 69, temperature (!) 97.1 F (36.2 C), temperature source Oral, resp. rate 14, height 5\' 2"  (1.575 m), weight 99 lb 12.8 oz (45.3 kg), SpO2 99%. Physical Exam Vitals reviewed.  Constitutional:      Appearance: Normal appearance.  HENT:     Head: Normocephalic and atraumatic.  Eyes:     Extraocular Movements: Extraocular movements intact.     Pupils: Pupils are equal, round, and reactive to light.  Cardiovascular:     Rate and Rhythm: Normal rate. Rhythm irregular.  Pulmonary:     Effort: Pulmonary effort is normal.     Breath sounds: Normal breath sounds.  Abdominal:     General: There is no distension.     Palpations: Abdomen is soft.     Tenderness: There is no abdominal tenderness.  Musculoskeletal:        General: Normal range of motion.     Cervical back: Normal range of motion.  Skin:    General: Skin is warm and dry.      Comments: Right anterior shin with 1 cm x 1.2 cm lesion with overlying scab, no surrounding erythema or induration; nontender to palpation  Neurological:     General: No focal deficit present.     Mental Status: She is alert and oriented to person, place, and time.  Psychiatric:        Mood and Affect: Mood normal.        Behavior: Behavior normal.     Results: Pathology (08/03/23): 1.  Skin, right leg, biopsy Well-differentiated squamous cell carcinoma   Assessment & Plan:  Shelly Fields is a 89 y.o. female who presents for evaluation of a right lower extremity squamous cell carcinoma.  -We discussed her pathology finding demonstrating well-differentiated squamous cell carcinoma.  We discussed that the next step in recommendation would be for wide excision of this area.  The recommendation is for 1/2 cm margin  around squamous cell carcinoma -The risk and benefits of excision of the right lower extremity squamous cell carcinoma were discussed including but not limited to bleeding, infection, need for additional procedures, postoperative seroma, and injury to surrounding structures.  After careful consideration, KADYNCE BONDS has decided to proceed with surgery. -Patient tentatively scheduled for surgery on 4/7 -Will reach out to the patient's cardiologist given her CHF.  Both the patient's [name] and Xarelto will need to be held preoperatively. -Information provided to the patient on squamous cell carcinoma  All questions were answered to the satisfaction of the patient.  Note: Portions of this report may have been transcribed using voice recognition software. Every effort has been made to ensure accuracy; however, inadvertent computerized transcription errors may still be present.   Theophilus Kinds, DO [facility] [address] [place], [place] [address] [phone] (office)

## 2023-09-07 NOTE — Patient Instructions (Signed)
 Shelly Fields  09/07/2023     @PREFPERIOPPHARMACY @   Your procedure is scheduled on 09/13/2023.   Report to [facility] at  0600  A.M.   Call this number if you have problems the morning of surgery:  [phone]  If you experience any cold or flu symptoms such as cough, fever, chills, shortness of breath, etc. between now and your scheduled surgery, please notify us at the above number.   Remember:  Do not eat after midnight.   You may drink clear liquids until  0330 am on 09/13/2023.    Clear liquids allowed are:                    Water, Juice (No red color; non-citric and without pulp; diabetics please choose diet or no sugar options), Carbonated beverages (diabetics please choose diet or no sugar options), Clear Tea (No creamer, milk, or cream, including half & half and powdered creamer), Black Coffee Only (No creamer, milk or cream, including half & half and powdered creamer), and Clear Sports drink (No red color; diabetics please choose diet or no sugar options)    Take these medicines the morning of surgery with A SIP OF WATER                        alprazolam (if needed), levothyroxine, metoprolol.    Do not wear jewelry, make-up or nail polish, including gel polish,  artificial nails, or any other type of covering on natural nails (fingers and  toes).  Do not wear lotions, powders, or perfumes, or deodorant.  Do not shave 48 hours prior to surgery.  Men may shave face and neck.  Do not bring valuables to the hospital.  [facility] is not responsible for any belongings or valuables.  Contacts, dentures or bridgework may not be worn into surgery.  Leave your suitcase in the car.  After surgery it may be brought to your room.  For patients admitted to the hospital, discharge time will be determined by your treatment team.  Patients discharged the day of surgery will not be allowed to drive home and must have someone with them for 24 hours.    Special  instructions:   DO NOT smoke tobacco or vape for 24 hours before your procedure.  Please read over the following fact sheets that you were given. Anesthesia Post-op Instructions and Care and Recovery After Surgery        Excision of Skin Lesions, Care After The following information offers guidance on how to care for yourself after your procedure. Your health care provider may also give you more specific instructions. If you have problems or questions, contact your health care provider. What can I expect after the procedure? After your procedure, it is common to have: Soreness or mild pain. Some redness and swelling. Follow these instructions at home: Excision site care  Follow instructions from your health care provider about how to take care of your excision site. Make sure you: Wash your hands with soap and water for at least 20 seconds before and after you change your bandage (dressing). If soap and water are not available, use hand sanitizer. Change your dressing as told by your health care provider. Leave stitches (sutures), skin glue, or adhesive strips in place. These skin closures may need to stay in place for 2 weeks or longer. If adhesive strip edges start to loosen and curl up,  you may trim the loose edges. Do not remove adhesive strips completely unless your health care provider tells you to do that. Check the excision area every day for signs of infection. Watch for: More redness, swelling, or pain. Fluid or blood. Warmth. Pus or a bad smell. Keep the site clean, dry, and protected for at least 48 hours. For bleeding, apply gentle but firm pressure to the area using a folded towel for 20 minutes. Do not take baths, swim, or use a hot tub until your health care provider approves. Ask your health care provider if you may take showers. You may only be allowed to take sponge baths. General instructions Take over-the-counter and prescription medicines only as told by your  health care provider. Follow instructions from your health care provider about how to minimize scarring. Scarring should lessen over time. Avoid sun exposure until the area has healed. Use sunscreen to protect the area from the sun after it has healed. Avoid high-impact exercise and activities until the sutures are removed or the area heals. Keep all follow-up visits. This is important. Contact a health care provider if: You have more redness, swelling, or pain around your excision site. You have fluid or blood coming from your excision site. Your excision site feels warm to the touch. You have pus or a bad smell coming from your excision site. You have a fever. You have pain that does not improve in 2-3 days after your procedure. Get help right away if: You have bleeding that does not stop with pressure or a dressing. Your wound opens up. Summary Take over-the-counter and prescription medicines only as told by your health care provider. Change your dressing as told by your health care provider. Contact a health care provider if you have redness, swelling, pain, or other signs of infection around your excision site. Keep all follow-up visits. This is important. This information is not intended to replace advice given to you by your health care provider. Make sure you discuss any questions you have with your health care provider. Document Revised: 12/24/2020 Document Reviewed: 12/24/2020 Elsevier Patient Education  2024 Elsevier Inc.General Anesthesia, Adult, Care After The following information offers guidance on how to care for yourself after your procedure. Your health care provider may also give you more specific instructions. If you have problems or questions, contact your health care provider. What can I expect after the procedure? After the procedure, it is common for people to: Have pain or discomfort at the IV site. Have nausea or vomiting. Have a sore throat or hoarseness. Have  trouble concentrating. Feel cold or chills. Feel weak, sleepy, or tired (fatigue). Have soreness and body aches. These can affect parts of the body that were not involved in surgery. Follow these instructions at home: For the time period you were told by your health care provider:  Rest. Do not participate in activities where you could fall or become injured. Do not drive or use machinery. Do not drink alcohol. Do not take sleeping pills or medicines that cause drowsiness. Do not make important decisions or sign legal documents. Do not take care of children on your own. General instructions Drink enough fluid to keep your urine pale yellow. If you have sleep apnea, surgery and certain medicines can increase your risk for breathing problems. Follow instructions from your health care provider about wearing your sleep device: Anytime you are sleeping, including during daytime naps. While taking prescription pain medicines, sleeping medicines, or medicines that make you drowsy.  Return to your normal activities as told by your health care provider. Ask your health care provider what activities are safe for you. Take over-the-counter and prescription medicines only as told by your health care provider. Do not use any products that contain nicotine or tobacco. These products include cigarettes, chewing tobacco, and vaping devices, such as e-cigarettes. These can delay incision healing after surgery. If you need help quitting, ask your health care provider. Contact a health care provider if: You have nausea or vomiting that does not get better with medicine. You vomit every time you eat or drink. You have pain that does not get better with medicine. You cannot urinate or have bloody urine. You develop a skin rash. You have a fever. Get help right away if: You have trouble breathing. You have chest pain. You vomit blood. These symptoms may be an emergency. Get help right away. Call 911. Do  not wait to see if the symptoms will go away. Do not drive yourself to the hospital. Summary After the procedure, it is common to have a sore throat, hoarseness, nausea, vomiting, or to feel weak, sleepy, or fatigue. For the time period you were told by your health care provider, do not drive or use machinery. Get help right away if you have difficulty breathing, have chest pain, or vomit blood. These symptoms may be an emergency. This information is not intended to replace advice given to you by your health care provider. Make sure you discuss any questions you have with your health care provider. Document Revised: 08/22/2021 Document Reviewed: 08/22/2021 Elsevier Patient Education  2024 Elsevier Inc.How to Use Chlorhexidine at Home in the Shower Chlorhexidine gluconate (CHG) is a germ-killing (antiseptic) wash that's used to clean the skin. It can get rid of the germs that normally live on the skin and can keep them away for about 24 hours. If you're having surgery, you may be told to shower with CHG at home the night before surgery. This can help lower your risk for infection. To use CHG wash in the shower, follow the steps below. Supplies needed: CHG body wash. Clean washcloth. Clean towel. How to use CHG in the shower Follow these steps unless you're told to use CHG in a different way: Start the shower. Use your normal soap and shampoo to wash your face and hair. Turn off the shower or move out of the shower stream. Pour CHG onto a clean washcloth. Do not use any type of brush or rough sponge. Start at your neck, washing your body down to your toes. Make sure you: Wash the part of your body where the surgery will be done for at least 1 minute. Do not scrub. Do not use CHG on your head or face unless your health care provider tells you to. If it gets into your ears or eyes, rinse them well with water. Do not wash your genitals with CHG. Wash your back and under your arms. Make sure to  wash skin folds. Let the CHG sit on your skin for 1-2 minutes or as long as told. Rinse your entire body in the shower, including all body creases and folds. Turn off the shower. Dry off with a clean towel. Do not put anything on your skin afterward, such as powder, lotion, or perfume. Put on clean clothes or pajamas. If it's the night before surgery, sleep in clean sheets. General tips Use CHG only as told, and follow the instructions on the label. Use the full amount  of CHG as told. This is often one bottle. Do not smoke and stay away from flames after using CHG. Your skin may feel sticky after using CHG. This is normal. The sticky feeling will go away as the CHG dries. Do not use CHG: If you have a chlorhexidine allergy or have reacted to chlorhexidine in the past. On open wounds or areas of skin that have broken skin, cuts, or scrapes. On babies younger than 73 months of age. Contact a health care provider if: You have questions about using CHG. Your skin gets irritated or itchy. You have a rash after using CHG. You swallow any CHG. Call your local poison control center [phone] in the [place] Your eyes itch badly, or they become very red or swollen. Your hearing changes. You have trouble seeing. If you can't reach your provider, go to an urgent care or emergency room. Do not drive yourself. Get help right away if: You have swelling or tingling in your mouth or throat. You make high-pitched whistling sounds when you breathe, most often when you breathe out (wheeze). You have trouble breathing. These symptoms may be an emergency. Call 911 right away. Do not wait to see if the symptoms will go away. Do not drive yourself to the hospital. This information is not intended to replace advice given to you by your health care provider. Make sure you discuss any questions you have with your health care provider. Document Revised: 12/08/2022 Document Reviewed: 12/04/2021 Elsevier  Patient Education  2024 [employer].

## 2023-09-08 ENCOUNTER — Encounter (HOSPITAL_COMMUNITY)
Admission: RE | Admit: 2023-09-08 | Discharge: 2023-09-08 | Disposition: A | Discharge status: Home / Self Care | Source: Ambulatory Visit | Attending: Surgery | Admitting: Surgery

## 2023-09-08 ENCOUNTER — Encounter (HOSPITAL_COMMUNITY): Payer: Self-pay

## 2023-09-08 VITALS — BP 112/76 | HR 78 | Temp 97.2°F | Resp 18 | Ht 62.0 in | Wt 98.6 lb

## 2023-09-08 DIAGNOSIS — D509 Iron deficiency anemia, unspecified: Secondary | ICD-10-CM

## 2023-09-08 DIAGNOSIS — Z79899 Other long term (current) drug therapy: Secondary | ICD-10-CM

## 2023-09-10 NOTE — Anesthesia Preprocedure Evaluation (Addendum)
 Anesthesia Evaluation  Patient identified by MRN, date of birth, ID band Patient awake    Reviewed: Allergy & Precautions, H&P , NPO status , Patient's Chart, lab work & pertinent test results, reviewed documented beta blocker date and time   Airway Mallampati: II  TM Distance: >3 FB Neck ROM: full    Dental no notable dental hx. (+) Dental Advisory Given, Teeth Intact   Pulmonary neg pulmonary ROS   Pulmonary exam normal breath sounds clear to auscultation       Cardiovascular hypertension, pulmonary hypertensionNormal cardiovascular exam+ dysrhythmias Atrial Fibrillation  Rhythm:irregular Rate:Normal     Neuro/Psych negative neurological ROS  negative psych ROS   GI/Hepatic negative GI ROS, Neg liver ROS,,,  Endo/Other  Hypothyroidism    Renal/GU negative Renal ROS  negative genitourinary   Musculoskeletal   Abdominal   Peds  Hematology negative hematology ROS (+) Blood dyscrasia, anemia   Anesthesia Other Findings   Reproductive/Obstetrics negative OB ROS                             Anesthesia Physical Anesthesia Plan  ASA: 3  Anesthesia Plan: General   Post-op Pain Management: Minimal or no pain anticipated   Induction: Intravenous  PONV Risk Score and Plan: Propofol infusion  Airway Management Planned: Nasal Cannula and Natural Airway  Additional Equipment: None  Intra-op Plan:   Post-operative Plan:   Informed Consent: I have reviewed the patients History and Physical, chart, labs and discussed the procedure including the risks, benefits and alternatives for the proposed anesthesia with the patient or authorized representative who has indicated his/her understanding and acceptance.     Dental Advisory Given  Plan Discussed with: CRNA  Anesthesia Plan Comments:        Anesthesia Quick Evaluation

## 2023-09-13 ENCOUNTER — Ambulatory Visit (HOSPITAL_COMMUNITY): Payer: Self-pay | Admitting: Anesthesiology

## 2023-09-13 ENCOUNTER — Ambulatory Visit (HOSPITAL_BASED_OUTPATIENT_CLINIC_OR_DEPARTMENT_OTHER)
Admission: RE | Admit: 2023-09-13 | Discharge: 2023-09-13 | Disposition: A | Discharge status: Home / Self Care | Source: Ambulatory Visit | Attending: Surgery | Admitting: Surgery

## 2023-09-13 ENCOUNTER — Encounter (HOSPITAL_COMMUNITY): Payer: Self-pay | Admitting: Surgery

## 2023-09-13 ENCOUNTER — Encounter (HOSPITAL_COMMUNITY)
Admission: RE | Disposition: A | Discharge status: Home / Self Care | Payer: Self-pay | Source: Ambulatory Visit | Attending: Surgery

## 2023-09-13 ENCOUNTER — Ambulatory Visit (HOSPITAL_BASED_OUTPATIENT_CLINIC_OR_DEPARTMENT_OTHER): Payer: Self-pay | Admitting: Anesthesiology

## 2023-09-13 ENCOUNTER — Other Ambulatory Visit: Payer: Self-pay

## 2023-09-13 DIAGNOSIS — E876 Hypokalemia: Secondary | ICD-10-CM | POA: Diagnosis not present

## 2023-09-13 DIAGNOSIS — I10 Essential (primary) hypertension: Secondary | ICD-10-CM | POA: Insufficient documentation

## 2023-09-13 DIAGNOSIS — Z7984 Long term (current) use of oral hypoglycemic drugs: Secondary | ICD-10-CM | POA: Insufficient documentation

## 2023-09-13 DIAGNOSIS — Z8249 Family history of ischemic heart disease and other diseases of the circulatory system: Secondary | ICD-10-CM | POA: Diagnosis not present

## 2023-09-13 DIAGNOSIS — I2781 Cor pulmonale (chronic): Secondary | ICD-10-CM | POA: Diagnosis not present

## 2023-09-13 DIAGNOSIS — J470 Bronchiectasis with acute lower respiratory infection: Secondary | ICD-10-CM | POA: Diagnosis not present

## 2023-09-13 DIAGNOSIS — Z66 Do not resuscitate: Secondary | ICD-10-CM | POA: Diagnosis not present

## 2023-09-13 DIAGNOSIS — R918 Other nonspecific abnormal finding of lung field: Secondary | ICD-10-CM | POA: Diagnosis not present

## 2023-09-13 DIAGNOSIS — D649 Anemia, unspecified: Secondary | ICD-10-CM | POA: Insufficient documentation

## 2023-09-13 DIAGNOSIS — I5082 Biventricular heart failure: Secondary | ICD-10-CM | POA: Diagnosis not present

## 2023-09-13 DIAGNOSIS — I2729 Other secondary pulmonary hypertension: Secondary | ICD-10-CM | POA: Diagnosis not present

## 2023-09-13 DIAGNOSIS — Z7902 Long term (current) use of antithrombotics/antiplatelets: Secondary | ICD-10-CM | POA: Insufficient documentation

## 2023-09-13 DIAGNOSIS — Z7901 Long term (current) use of anticoagulants: Secondary | ICD-10-CM | POA: Insufficient documentation

## 2023-09-13 DIAGNOSIS — K2970 Gastritis, unspecified, without bleeding: Secondary | ICD-10-CM | POA: Diagnosis not present

## 2023-09-13 DIAGNOSIS — I4821 Permanent atrial fibrillation: Secondary | ICD-10-CM | POA: Diagnosis not present

## 2023-09-13 DIAGNOSIS — Z8619 Personal history of other infectious and parasitic diseases: Secondary | ICD-10-CM | POA: Diagnosis not present

## 2023-09-13 DIAGNOSIS — E871 Hypo-osmolality and hyponatremia: Secondary | ICD-10-CM | POA: Diagnosis not present

## 2023-09-13 DIAGNOSIS — Z79899 Other long term (current) drug therapy: Secondary | ICD-10-CM | POA: Insufficient documentation

## 2023-09-13 DIAGNOSIS — K643 Fourth degree hemorrhoids: Secondary | ICD-10-CM | POA: Diagnosis not present

## 2023-09-13 DIAGNOSIS — C44722 Squamous cell carcinoma of skin of right lower limb, including hip: Secondary | ICD-10-CM | POA: Diagnosis not present

## 2023-09-13 DIAGNOSIS — E039 Hypothyroidism, unspecified: Secondary | ICD-10-CM

## 2023-09-13 DIAGNOSIS — J9601 Acute respiratory failure with hypoxia: Secondary | ICD-10-CM | POA: Diagnosis not present

## 2023-09-13 DIAGNOSIS — L570 Actinic keratosis: Secondary | ICD-10-CM | POA: Diagnosis not present

## 2023-09-13 DIAGNOSIS — N179 Acute kidney failure, unspecified: Secondary | ICD-10-CM | POA: Diagnosis not present

## 2023-09-13 DIAGNOSIS — Z79890 Hormone replacement therapy (postmenopausal): Secondary | ICD-10-CM | POA: Insufficient documentation

## 2023-09-13 DIAGNOSIS — I5033 Acute on chronic diastolic (congestive) heart failure: Secondary | ICD-10-CM | POA: Diagnosis not present

## 2023-09-13 DIAGNOSIS — K922 Gastrointestinal hemorrhage, unspecified: Secondary | ICD-10-CM | POA: Diagnosis not present

## 2023-09-13 DIAGNOSIS — D62 Acute posthemorrhagic anemia: Secondary | ICD-10-CM | POA: Diagnosis not present

## 2023-09-13 DIAGNOSIS — I4819 Other persistent atrial fibrillation: Secondary | ICD-10-CM | POA: Insufficient documentation

## 2023-09-13 DIAGNOSIS — J690 Pneumonitis due to inhalation of food and vomit: Secondary | ICD-10-CM | POA: Diagnosis not present

## 2023-09-13 DIAGNOSIS — I110 Hypertensive heart disease with heart failure: Secondary | ICD-10-CM | POA: Diagnosis not present

## 2023-09-13 DIAGNOSIS — I4891 Unspecified atrial fibrillation: Secondary | ICD-10-CM | POA: Diagnosis not present

## 2023-09-13 DIAGNOSIS — R0602 Shortness of breath: Secondary | ICD-10-CM | POA: Diagnosis not present

## 2023-09-13 HISTORY — PX: LESION REMOVAL: SHX5196

## 2023-09-13 SURGERY — EXCISION, LESION, LOWER EXTREMITY
Anesthesia: General | Site: Leg Lower | Laterality: Right

## 2023-09-13 MED ORDER — FENTANYL CITRATE PF 50 MCG/ML IJ SOSY: 25.0000 ug | PREFILLED_SYRINGE | INTRAMUSCULAR | Status: DC | PRN

## 2023-09-13 MED ORDER — PROPOFOL 10 MG/ML IV BOLUS
INTRAVENOUS | Status: AC
  Filled 2023-09-13: qty 20

## 2023-09-13 MED ORDER — BUPIVACAINE HCL (PF) 0.5 % IJ SOLN
INTRAMUSCULAR | Status: DC | PRN
  Administered 2023-09-13: 20 mL

## 2023-09-13 MED ORDER — DOCUSATE SODIUM 100 MG PO CAPS: 100.0000 mg | ORAL_CAPSULE | Freq: Two times a day (BID) | ORAL | 2 refills | Status: AC

## 2023-09-13 MED ORDER — LIDOCAINE HCL (PF) 2 % IJ SOLN
INTRAMUSCULAR | Status: DC | PRN
  Administered 2023-09-13: 50 mg via INTRADERMAL

## 2023-09-13 MED ORDER — DEXAMETHASONE SODIUM PHOSPHATE 10 MG/ML IJ SOLN
INTRAMUSCULAR | Status: AC
  Filled 2023-09-13: qty 1

## 2023-09-13 MED ORDER — CEFAZOLIN SODIUM-DEXTROSE 2-4 GM/100ML-% IV SOLN
2.0000 g | INTRAVENOUS | Status: AC
  Administered 2023-09-13: 2 g via INTRAVENOUS

## 2023-09-13 MED ORDER — LACTATED RINGERS IV SOLN: INTRAVENOUS | Status: DC

## 2023-09-13 MED ORDER — PROPOFOL 10 MG/ML IV BOLUS
INTRAVENOUS | Status: DC | PRN
  Administered 2023-09-13: 100 mg via INTRAVENOUS

## 2023-09-13 MED ORDER — GLYCOPYRROLATE PF 0.2 MG/ML IJ SOSY
PREFILLED_SYRINGE | INTRAMUSCULAR | Status: AC
  Filled 2023-09-13: qty 1

## 2023-09-13 MED ORDER — CHLORHEXIDINE GLUCONATE CLOTH 2 % EX PADS: 6.0000 | MEDICATED_PAD | Freq: Once | CUTANEOUS | Status: DC

## 2023-09-13 MED ORDER — RIVAROXABAN 15 MG PO TABS: 15.0000 mg | ORAL_TABLET | Freq: Every day | ORAL | Status: DC

## 2023-09-13 MED ORDER — ACETAMINOPHEN 160 MG/5ML PO SOLN
960.0000 mg | Freq: Once | ORAL | Status: DC
  Filled 2023-09-13: qty 30

## 2023-09-13 MED ORDER — ORAL CARE MOUTH RINSE: 15.0000 mL | Freq: Once | OROMUCOSAL | Status: DC

## 2023-09-13 MED ORDER — DEXAMETHASONE SODIUM PHOSPHATE 10 MG/ML IJ SOLN
INTRAMUSCULAR | Status: DC | PRN
Start: 2023-09-13 — End: 2023-09-13
  Administered 2023-09-13: 5 mg via INTRAVENOUS

## 2023-09-13 MED ORDER — PHENYLEPHRINE 80 MCG/ML (10ML) SYRINGE FOR IV PUSH (FOR BLOOD PRESSURE SUPPORT)
PREFILLED_SYRINGE | INTRAVENOUS | Status: AC
  Filled 2023-09-13: qty 10

## 2023-09-13 MED ORDER — HYDROCODONE-ACETAMINOPHEN 7.5-325 MG PO TABS: 1.0000 | ORAL_TABLET | Freq: Once | ORAL | Status: DC | PRN

## 2023-09-13 MED ORDER — FENTANYL CITRATE (PF) 100 MCG/2ML IJ SOLN
INTRAMUSCULAR | Status: AC
  Filled 2023-09-13: qty 2

## 2023-09-13 MED ORDER — FENTANYL CITRATE (PF) 100 MCG/2ML IJ SOLN
INTRAMUSCULAR | Status: DC | PRN
  Administered 2023-09-13: 25 ug via INTRAVENOUS

## 2023-09-13 MED ORDER — CHLORHEXIDINE GLUCONATE 0.12 % MT SOLN: 15.0000 mL | Freq: Once | OROMUCOSAL | Status: DC

## 2023-09-13 MED ORDER — OXYCODONE HCL 5 MG PO TABS: 5.0000 mg | ORAL_TABLET | Freq: Four times a day (QID) | ORAL | 0 refills | Status: DC | PRN

## 2023-09-13 MED ORDER — ONDANSETRON HCL 4 MG/2ML IJ SOLN
INTRAMUSCULAR | Status: AC
  Filled 2023-09-13: qty 2

## 2023-09-13 MED ORDER — GLYCOPYRROLATE PF 0.2 MG/ML IJ SOSY
PREFILLED_SYRINGE | INTRAMUSCULAR | Status: DC | PRN
  Administered 2023-09-13 (×2): .1 mg via INTRAVENOUS

## 2023-09-13 MED ORDER — BUPIVACAINE HCL (PF) 0.5 % IJ SOLN
INTRAMUSCULAR | Status: AC
  Filled 2023-09-13: qty 30

## 2023-09-13 MED ORDER — 0.9 % SODIUM CHLORIDE (POUR BTL) OPTIME
TOPICAL | Status: DC | PRN
Start: 2023-09-13 — End: 2023-09-13
  Administered 2023-09-13: 1000 mL

## 2023-09-13 MED ORDER — ONDANSETRON HCL 4 MG/2ML IJ SOLN
INTRAMUSCULAR | Status: DC | PRN
  Administered 2023-09-13: 4 mg via INTRAVENOUS

## 2023-09-13 MED ORDER — CEFAZOLIN SODIUM-DEXTROSE 2-4 GM/100ML-% IV SOLN
INTRAVENOUS | Status: AC
  Filled 2023-09-13: qty 100

## 2023-09-13 MED ORDER — ACETAMINOPHEN 500 MG PO TABS: 1000.0000 mg | ORAL_TABLET | Freq: Once | ORAL | Status: DC

## 2023-09-13 MED ORDER — KETOROLAC TROMETHAMINE 30 MG/ML IJ SOLN
INTRAMUSCULAR | Status: AC
  Filled 2023-09-13: qty 1

## 2023-09-13 MED ORDER — LACTATED RINGERS IV SOLN: INTRAVENOUS | Status: DC | PRN

## 2023-09-13 MED ORDER — LIDOCAINE HCL (PF) 2 % IJ SOLN
INTRAMUSCULAR | Status: AC
  Filled 2023-09-13: qty 5

## 2023-09-13 MED ORDER — PHENYLEPHRINE 80 MCG/ML (10ML) SYRINGE FOR IV PUSH (FOR BLOOD PRESSURE SUPPORT)
PREFILLED_SYRINGE | INTRAVENOUS | Status: DC | PRN
  Administered 2023-09-13 (×6): 160 ug via INTRAVENOUS

## 2023-09-13 MED ORDER — CHLORHEXIDINE GLUCONATE CLOTH 2 % EX PADS
6.0000 | MEDICATED_PAD | Freq: Once | CUTANEOUS | Status: DC
Start: 2023-09-13 — End: 2023-09-13

## 2023-09-13 MED ORDER — ACETAMINOPHEN 500 MG PO TABS: 1000.0000 mg | ORAL_TABLET | Freq: Four times a day (QID) | ORAL | 0 refills | Status: DC

## 2023-09-13 SURGICAL SUPPLY — 28 items
APPLICATOR CHLORAPREP 10.5 ORG (MISCELLANEOUS) ×1 IMPLANT
CLOTH BEACON ORANGE TIMEOUT ST (SAFETY) ×1 IMPLANT
COVER LIGHT HANDLE STERIS (MISCELLANEOUS) ×2 IMPLANT
DECANTER SPIKE VIAL GLASS SM (MISCELLANEOUS) ×1 IMPLANT
DRSG VASELINE 3X18 (GAUZE/BANDAGES/DRESSINGS) IMPLANT
ELECT NDL TIP 2.8 STRL (NEEDLE) IMPLANT
ELECT NEEDLE TIP 2.8 STRL (NEEDLE) ×1 IMPLANT
ELECT REM PT RETURN 9FT ADLT (ELECTROSURGICAL) ×1 IMPLANT
ELECTRODE REM PT RTRN 9FT ADLT (ELECTROSURGICAL) ×1 IMPLANT
GAUZE SPONGE 4X4 12PLY STRL (GAUZE/BANDAGES/DRESSINGS) IMPLANT
GLOVE BIOGEL PI IND STRL 6.5 (GLOVE) ×1 IMPLANT
GLOVE BIOGEL PI IND STRL 7.0 (GLOVE) ×2 IMPLANT
GLOVE SURG SS PI 6.5 STRL IVOR (GLOVE) ×2 IMPLANT
GOWN STRL REUS W/TWL LRG LVL3 (GOWN DISPOSABLE) ×2 IMPLANT
KIT TURNOVER KIT A (KITS) ×1 IMPLANT
MANIFOLD NEPTUNE II (INSTRUMENTS) ×1 IMPLANT
NDL HYPO 25X1 1.5 SAFETY (NEEDLE) ×1 IMPLANT
NEEDLE HYPO 25X1 1.5 SAFETY (NEEDLE) ×1 IMPLANT
NS IRRIG 1000ML POUR BTL (IV SOLUTION) ×1 IMPLANT
PACK MINOR (CUSTOM PROCEDURE TRAY) IMPLANT
PAD ARMBOARD POSITIONER FOAM (MISCELLANEOUS) ×1 IMPLANT
POSITIONER HEAD 8X9X4 ADT (SOFTGOODS) ×1 IMPLANT
SET BASIN LINEN APH (SET/KITS/TRAYS/PACK) ×1 IMPLANT
SUT 3-0 BLK 1X30 PSL (SUTURE) IMPLANT
SUT MNCRL AB 4-0 PS2 18 (SUTURE) ×1 IMPLANT
SUT VIC AB 3-0 SH 27X BRD (SUTURE) ×1 IMPLANT
SYR CONTROL 10ML LL (SYRINGE) ×1 IMPLANT
TAPE CLOTH SURG 4X10 WHT LF (GAUZE/BANDAGES/DRESSINGS) IMPLANT

## 2023-09-13 NOTE — Anesthesia Procedure Notes (Signed)
 Procedure Name: LMA Insertion Date/Time: 09/13/2023 7:38 AM  Performed by: Julian Reil, CRNAPre-anesthesia Checklist: Patient identified, Emergency Drugs available, Suction available and Patient being monitored Patient Re-evaluated:Patient Re-evaluated prior to induction Oxygen Delivery Method: Circle system utilized Preoxygenation: Pre-oxygenation with 100% oxygen Induction Type: IV induction Ventilation: Mask ventilation with difficulty LMA: LMA inserted LMA Size: 4.0 Tube type: Oral Number of attempts: 1 Placement Confirmation: positive ETCO2 Tube secured with: Tape Dental Injury: Injury to lip  Comments: Scant amount of blood noted left side of upper lip with LMA insertion.  Pressure held at site.

## 2023-09-13 NOTE — Interval H&P Note (Signed)
 History and Physical Interval Note:  09/13/2023 7:16 AM  Shelly Fields  has presented today for surgery, with the diagnosis of SQUAMOUS CELL CARCINOMA, 1.2 CM RIGHT LOWER EXTREMITY.  The various methods of treatment have been discussed with the patient and family. After consideration of risks, benefits and other options for treatment, the patient has consented to  Procedure(s): EXCISION, LESION, LOWER EXTREMITY (Right) as a surgical intervention.  The patient's history has been reviewed, patient examined, no change in status, stable for surgery.  I have reviewed the patient's chart and labs.  Questions were answered to the patient's satisfaction.     [name] A Colin Ellers

## 2023-09-13 NOTE — Progress Notes (Signed)
 [facility]  Spoke with the patient's son in the consultation room.  I explained that she tolerated the procedure without difficulty.  She has external stitches in place that will be removed at her follow-up visit. I discharged her home with a prescription for narcotic pain medication that they should take as needed for pain.  I also want her taking scheduled Tylenol.  If they take the narcotic pain medication, they should take a stool softener as well.  The patient will follow-up with me in 2 weeks.  All questions were answered to his expressed satisfaction.  Theophilus Kinds, DO [facility] [address] [place], [place] [address] [phone] (office)

## 2023-09-13 NOTE — Anesthesia Postprocedure Evaluation (Signed)
 Anesthesia Post Note  Patient: Shelly Fields  Procedure(s) Performed: excision of right lower extremity squamous cell carcinoma (Right: Leg Lower)  Patient location during evaluation: PACU Anesthesia Type: General Level of consciousness: awake and alert Pain management: pain level controlled Vital Signs Assessment: post-procedure vital signs reviewed and stable Respiratory status: spontaneous breathing, nonlabored ventilation, respiratory function stable and patient connected to nasal cannula oxygen Cardiovascular status: blood pressure returned to baseline and stable Postop Assessment: no apparent nausea or vomiting Anesthetic complications: no   There were no known notable events for this encounter.   Last Vitals:  Vitals:   09/13/23 0900 09/13/23 0910  BP: (!) 126/59 126/75  Pulse: 89 86  Resp: (!) 21 16  Temp: (!) 36.3 C (!) 36.3 C  SpO2: 96% 97%    Last Pain:  Vitals:   09/13/23 0910  TempSrc: Oral  PainSc: 0-No pain                 [name]

## 2023-09-13 NOTE — Discharge Instructions (Addendum)
 Ambulatory Surgery Discharge Instructions  General Anesthesia or Sedation Do not drive or operate heavy machinery for 24 hours.  Do not consume alcohol, tranquilizers, sleeping medications, or any non-prescribed medications for 24 hours. Do not make important decisions or sign any important papers in the next 24 hours. You should have someone with you tonight at home.  Activity  You are advised to go directly home from the hospital.  Restrict your activities and rest for a day.  Resume light activity tomorrow. No heavy lifting over 10 lbs or strenuous exercise.  Fluids and Diet Begin with clear liquids, bouillon, dry toast, soda crackers.  If not nauseated, you may go to a regular diet when you desire.  Greasy and spicy foods are not advised.  Medications  If you have not had a bowel movement in 24 hours, take 2 tablespoons over the counter Milk of mag.             You May resume your blood thinners tomorrow (Aspirin, coumadin, or other).  You are being discharged with prescriptions for Opioid/Narcotic Medications: There are some specific considerations for these medications that you should know. Opioid Meds have risks & benefits. Addiction to these meds is always a concern with prolonged use Take medication only as directed Do not drive while taking narcotic pain medication Do not crush tablets or capsules Do not use a different container than medication was dispensed in Lock the container of medication in a cool, dry place out of reach of children and pets. Opioid medication can cause addiction Do not share with anyone else (this is a felony) Do not store medications for future use. Dispose of them properly.     Disposal:  Find a [employer] household drug take back site near you.  If you can't get to a drug take back site, use the recipe below as a last resort to dispose of expired, unused or unwanted drugs. Disposal  (Do not dispose chemotherapy drugs this way, talk to your  prescribing doctor instead.) Step 1: Mix drugs (do not crush) with dirt, kitty litter, or used coffee grounds and add a small amount of water to dissolve any solid medications. Step 2: Seal drugs in plastic bag. Step 3: Place plastic bag in trash. Step 4: Take prescription container and scratch out personal information, then recycle or throw away.  Operative Site  You have external stitches in place.  These will be removed at your follow up visit. Ok to English as a second language teacher. Keep wound clean and dry. No baths or swimming. No lifting more than 10 pounds.  Contact Information: If you have questions or concerns, please call our office, [phone], Monday- Thursday 8AM-5PM and Friday 8AM-12Noon.  If it is after hours or on the weekend, please call [facility] Main [facility], [phone], and ask to speak to the surgeon on call for Dr. Robyne Peers at [facility].   SPECIFIC COMPLICATIONS TO WATCH FOR: Inability to urinate Fever over 101? F by mouth Nausea and vomiting lasting longer than 24 hours. Pain not relieved by medication ordered Swelling around the operative site Increased redness, warmth, hardness, around operative area Numbness, tingling, or cold fingers or toes Blood -soaked dressing, (small amounts of oozing may be normal) Increasing and progressive drainage from surgical area or exam site  Post Anesthesia Home Care Instructions  Activity: Get plenty of rest for the remainder of the day. A responsible individual must stay with you for 24 hours following the procedure.  For the next 24 hours, DO  NOT: -Drive a car Environmental consultant -Drink alcoholic beverages -Take any medication unless instructed by your physician -Make any legal decisions or sign important papers.  Meals: Start with liquid foods such as gelatin or soup. Progress to regular foods as tolerated. Avoid greasy, spicy, heavy foods. If nausea and/or vomiting occur, drink only clear liquids until the nausea and/or vomiting  subsides. Call your physician if vomiting continues.  Special Instructions/Symptoms: Your throat may feel dry or sore from the anesthesia or the breathing tube placed in your throat during surgery. If this causes discomfort, gargle with warm salt water. The discomfort should disappear within 24 hours.

## 2023-09-13 NOTE — Transfer of Care (Signed)
 Immediate Anesthesia Transfer of Care Note  Patient: Shelly Fields  Procedure(s) Performed: excision of right lower extremity squamous cell carcinoma (Right: Leg Lower)  Patient Location: PACU  Anesthesia Type:General  Level of Consciousness: drowsy  Airway & Oxygen Therapy: Patient Spontanous Breathing and Patient connected to face mask oxygen  Post-op Assessment: Report given to RN and Post -op Vital signs reviewed and stable  Post vital signs: Reviewed and stable  Last Vitals:  Vitals Value Taken Time  BP 103/46   Temp 97.4   Pulse 83 09/13/23 0818  Resp 17 09/13/23 0818  SpO2 98 % 09/13/23 0818  Vitals shown include unfiled device data.  Last Pain:  Vitals:   09/13/23 0716  TempSrc: Oral  PainSc: 0-No pain      Patients Stated Pain Goal: 4 (09/13/23 0716)  Complications: No notable events documented.

## 2023-09-13 NOTE — Op Note (Addendum)
 [facility] Operative Note  09/13/23  Preoperative Diagnosis: Right lower extremity squamous cell carcinoma   Postoperative Diagnosis: Same   Procedure(s) Performed: Excision of right lower extremity squamous cell carcinoma   Surgeon: Theophilus Kinds, DO    Assistants: No qualified resident was available    Anesthesia: LMA   Anesthesiologist: Ronelle Nigh, MD    Specimens: Right squamous cell carcinoma, short stitch superior, long stitch lateral   Estimated Blood Loss: Minimal   Blood Replacement: None    Complications: None   Wound Class: Clean   Operative Indications: Patient is an 89 year old female who presents for wide local excision of a right lower extremity squamous cell carcinoma.  The area was biopsied by her primary care physician, and returned with squamous cell carcinoma, so she was referred to general surgery for wide local excision.  She is agreeable to surgery at this time.  All risks and benefits of performing this procedure were discussed with the patient including pain, infection, bleeding, damage to the surrounding structures, and need for more procedures or surgery. The patient voiced understanding of the procedure, all questions were sought and answered, and consent was obtained.  Findings:  -1 cm circular squamous cell carcinoma of the right lower extremity, 0.5 to 0.6 cm margin obtained circumferentially.  Total excision size measured 2 cm   Procedure: The patient was taken to the operating room and placed supine. LMA anesthesia was induced. Intravenous antibiotics were administered per protocol. The right lower extremity was prepared and draped in the usual sterile fashion.   A 0.5 to 0.6 cm margin was measured out and marked around the right lower extremity squamous cell carcinoma.  The planned excision was then marked.  An elliptical incision was made surrounding the lesion, taking care to maintain at least 1/2 cm margin  around the lesion.  Using electrocautery, the dermis and subcutaneous tissues dissected around the lesion. The lesion was removed in its entirety and sent to pathology for evaluation.  A long stitch was used to mark lateral and a short stitch marked superior. Hemostasis was achieved using electrocautery.  Tissue advancement flaps were created to allow for a tension-free closure of the incision.  The dermis was approximated using interrupted 3-0 Vicryl sutures. The skin was closed with 3-0 nylon in a vertical mattress fashion.  The incision was dressed with Vaseline gauze, 4 x 4's, and Medipore tape.   Final inspection revealed acceptable hemostasis. All counts were correct at the end of the case. The patient was awakened from anesthesia without complication.  The patient went to the PACU in stable condition.   Theophilus Kinds, DO  [facility] [address] [place], [place] [address] [phone] (office)

## 2023-09-14 ENCOUNTER — Encounter (HOSPITAL_COMMUNITY): Payer: Self-pay | Admitting: Surgery

## 2023-09-14 LAB — SURGICAL PATHOLOGY

## 2023-09-15 ENCOUNTER — Telehealth: Payer: Self-pay | Admitting: *Deleted

## 2023-09-15 ENCOUNTER — Telehealth: Payer: Self-pay | Admitting: Cardiology

## 2023-09-15 MED ORDER — TORSEMIDE 20 MG PO TABS: 40.0000 mg | ORAL_TABLET | Freq: Every day | ORAL | 3 refills | Status: DC

## 2023-09-15 NOTE — Telephone Encounter (Signed)
 Surgical Date: 09/13/2023 Procedure: Excision Mass, RLE  Received call from patient son, Trey Paula [phone] telephone.   Patient son reports that patient is having constipation and has not had BM >24 hours after surgery. States that patient usually takes Miralax and Senna daily. Advised to use MOM 30 mL.   Also states that patient has not taken Oxycodone as ordered as she is managing her pain with APAP.   Does state that patient reported pain in her right shoulder radiating up to her neck. States that she thinks she was in a strange position for too long and may have strained her neck muscles. Advised to use APAP as directed. Advised she may use OTC topical gels as directed. Advised if pain continues, to contact PCP.

## 2023-09-15 NOTE — Telephone Encounter (Signed)
  Pt c/o swelling/edema: STAT if pt has developed SOB within 24 hours  If swelling, where is the swelling located? Feet and ankle   How much weight have you gained and in what time span? Last week 98lb, today 105lb  Have you gained 2 pounds in a day or 5 pounds in a week? 7 lbs   Do you have a log of your daily weights (if so, list)? Yes listed above   Are you currently taking a fluid pill?  Yes, took two today   Are you currently SOB? No   Have you traveled recently in a car or plane for an extended period of time? No   BP: 113/56. Pt son noticed some swelling after pt surgery this past Monday. Please advise.

## 2023-09-15 NOTE — Telephone Encounter (Signed)
 Patient's son notified and verbalized understanding. All questions (if any) were answered.

## 2023-09-15 NOTE — Telephone Encounter (Signed)
 Spoke to pt's son who stated that pt had surgery on Monday on her leg and by Tuesday they had noticed some swelling in both legs. Pt's son stated that swelling looked worse this morning. Pt's current weight in 105.4 lb, normal weight is 98 lb. Pt wore compression stockings last night, but was not currently wearing them this morning. Pt's son stated that pt woke up in the middle of the night and had to sit up in bed d/t not being able to breathe. Pt O2 was 86%. Pt's son stated that pt is compliant with Torsemide dosage, however she did have to hold it for the surgery.    Please advise.

## 2023-09-16 ENCOUNTER — Other Ambulatory Visit: Payer: Self-pay

## 2023-09-16 ENCOUNTER — Emergency Department (HOSPITAL_COMMUNITY)

## 2023-09-16 ENCOUNTER — Encounter (HOSPITAL_COMMUNITY): Payer: Self-pay

## 2023-09-16 ENCOUNTER — Inpatient Hospital Stay (HOSPITAL_COMMUNITY)
Admission: EM | Admit: 2023-09-16 | Discharge: 2023-09-22 | DRG: 393 | Disposition: A | Discharge status: Home Health | Attending: Internal Medicine | Admitting: Internal Medicine

## 2023-09-16 DIAGNOSIS — D649 Anemia, unspecified: Secondary | ICD-10-CM | POA: Diagnosis not present

## 2023-09-16 DIAGNOSIS — I5033 Acute on chronic diastolic (congestive) heart failure: Secondary | ICD-10-CM | POA: Diagnosis not present

## 2023-09-16 DIAGNOSIS — K922 Gastrointestinal hemorrhage, unspecified: Secondary | ICD-10-CM

## 2023-09-16 DIAGNOSIS — N179 Acute kidney failure, unspecified: Secondary | ICD-10-CM | POA: Diagnosis present

## 2023-09-16 DIAGNOSIS — I4891 Unspecified atrial fibrillation: Secondary | ICD-10-CM | POA: Diagnosis not present

## 2023-09-16 DIAGNOSIS — E039 Hypothyroidism, unspecified: Secondary | ICD-10-CM | POA: Diagnosis present

## 2023-09-16 DIAGNOSIS — K643 Fourth degree hemorrhoids: Secondary | ICD-10-CM | POA: Diagnosis not present

## 2023-09-16 DIAGNOSIS — I4821 Permanent atrial fibrillation: Secondary | ICD-10-CM | POA: Diagnosis present

## 2023-09-16 DIAGNOSIS — D508 Other iron deficiency anemias: Principal | ICD-10-CM

## 2023-09-16 DIAGNOSIS — I5031 Acute diastolic (congestive) heart failure: Secondary | ICD-10-CM | POA: Diagnosis not present

## 2023-09-16 DIAGNOSIS — Z8619 Personal history of other infectious and parasitic diseases: Secondary | ICD-10-CM | POA: Diagnosis not present

## 2023-09-16 DIAGNOSIS — K2970 Gastritis, unspecified, without bleeding: Secondary | ICD-10-CM | POA: Diagnosis present

## 2023-09-16 DIAGNOSIS — C44722 Squamous cell carcinoma of skin of right lower limb, including hip: Secondary | ICD-10-CM | POA: Diagnosis present

## 2023-09-16 DIAGNOSIS — R531 Weakness: Secondary | ICD-10-CM | POA: Diagnosis not present

## 2023-09-16 DIAGNOSIS — R601 Generalized edema: Secondary | ICD-10-CM | POA: Diagnosis present

## 2023-09-16 DIAGNOSIS — I272 Pulmonary hypertension, unspecified: Secondary | ICD-10-CM | POA: Diagnosis not present

## 2023-09-16 DIAGNOSIS — I110 Hypertensive heart disease with heart failure: Secondary | ICD-10-CM | POA: Diagnosis present

## 2023-09-16 DIAGNOSIS — R54 Age-related physical debility: Secondary | ICD-10-CM | POA: Diagnosis present

## 2023-09-16 DIAGNOSIS — I2781 Cor pulmonale (chronic): Secondary | ICD-10-CM | POA: Diagnosis present

## 2023-09-16 DIAGNOSIS — J9601 Acute respiratory failure with hypoxia: Secondary | ICD-10-CM | POA: Diagnosis present

## 2023-09-16 DIAGNOSIS — Z8249 Family history of ischemic heart disease and other diseases of the circulatory system: Secondary | ICD-10-CM

## 2023-09-16 DIAGNOSIS — E876 Hypokalemia: Secondary | ICD-10-CM | POA: Diagnosis present

## 2023-09-16 DIAGNOSIS — D62 Acute posthemorrhagic anemia: Secondary | ICD-10-CM | POA: Diagnosis not present

## 2023-09-16 DIAGNOSIS — B9681 Helicobacter pylori [H. pylori] as the cause of diseases classified elsewhere: Secondary | ICD-10-CM | POA: Diagnosis present

## 2023-09-16 DIAGNOSIS — J479 Bronchiectasis, uncomplicated: Secondary | ICD-10-CM

## 2023-09-16 DIAGNOSIS — I2729 Other secondary pulmonary hypertension: Secondary | ICD-10-CM | POA: Diagnosis present

## 2023-09-16 DIAGNOSIS — Z79890 Hormone replacement therapy (postmenopausal): Secondary | ICD-10-CM

## 2023-09-16 DIAGNOSIS — K644 Residual hemorrhoidal skin tags: Secondary | ICD-10-CM | POA: Diagnosis present

## 2023-09-16 DIAGNOSIS — R918 Other nonspecific abnormal finding of lung field: Secondary | ICD-10-CM | POA: Diagnosis not present

## 2023-09-16 DIAGNOSIS — Z90710 Acquired absence of both cervix and uterus: Secondary | ICD-10-CM

## 2023-09-16 DIAGNOSIS — I501 Left ventricular failure: Secondary | ICD-10-CM | POA: Diagnosis not present

## 2023-09-16 DIAGNOSIS — K2990 Gastroduodenitis, unspecified, without bleeding: Secondary | ICD-10-CM | POA: Diagnosis not present

## 2023-09-16 DIAGNOSIS — I480 Paroxysmal atrial fibrillation: Secondary | ICD-10-CM | POA: Diagnosis present

## 2023-09-16 DIAGNOSIS — J470 Bronchiectasis with acute lower respiratory infection: Secondary | ICD-10-CM | POA: Diagnosis present

## 2023-09-16 DIAGNOSIS — Z8719 Personal history of other diseases of the digestive system: Secondary | ICD-10-CM | POA: Diagnosis not present

## 2023-09-16 DIAGNOSIS — Z7901 Long term (current) use of anticoagulants: Secondary | ICD-10-CM | POA: Diagnosis not present

## 2023-09-16 DIAGNOSIS — J90 Pleural effusion, not elsewhere classified: Secondary | ICD-10-CM | POA: Diagnosis not present

## 2023-09-16 DIAGNOSIS — I5082 Biventricular heart failure: Secondary | ICD-10-CM | POA: Diagnosis present

## 2023-09-16 DIAGNOSIS — R0902 Hypoxemia: Secondary | ICD-10-CM | POA: Diagnosis not present

## 2023-09-16 DIAGNOSIS — I50810 Right heart failure, unspecified: Secondary | ICD-10-CM | POA: Diagnosis not present

## 2023-09-16 DIAGNOSIS — I10 Essential (primary) hypertension: Secondary | ICD-10-CM | POA: Diagnosis present

## 2023-09-16 DIAGNOSIS — R0602 Shortness of breath: Secondary | ICD-10-CM | POA: Diagnosis not present

## 2023-09-16 DIAGNOSIS — K5730 Diverticulosis of large intestine without perforation or abscess without bleeding: Secondary | ICD-10-CM | POA: Diagnosis present

## 2023-09-16 DIAGNOSIS — E871 Hypo-osmolality and hyponatremia: Secondary | ICD-10-CM | POA: Diagnosis present

## 2023-09-16 DIAGNOSIS — Z79899 Other long term (current) drug therapy: Secondary | ICD-10-CM

## 2023-09-16 DIAGNOSIS — R0689 Other abnormalities of breathing: Secondary | ICD-10-CM | POA: Diagnosis not present

## 2023-09-16 DIAGNOSIS — J690 Pneumonitis due to inhalation of food and vomit: Secondary | ICD-10-CM | POA: Diagnosis not present

## 2023-09-16 DIAGNOSIS — Z66 Do not resuscitate: Secondary | ICD-10-CM | POA: Diagnosis present

## 2023-09-16 DIAGNOSIS — R7989 Other specified abnormal findings of blood chemistry: Secondary | ICD-10-CM | POA: Diagnosis not present

## 2023-09-16 DIAGNOSIS — D500 Iron deficiency anemia secondary to blood loss (chronic): Secondary | ICD-10-CM | POA: Diagnosis not present

## 2023-09-16 LAB — CBC WITH DIFFERENTIAL/PLATELET
Abs Immature Granulocytes: 0.07 10*3/uL (ref 0.00–0.07)
Basophils Absolute: 0 10*3/uL (ref 0.0–0.1)
Basophils Relative: 0 %
Eosinophils Absolute: 0 10*3/uL (ref 0.0–0.5)
Eosinophils Relative: 0 %
HCT: 20.8 % — ABNORMAL LOW (ref 36.0–46.0)
Hemoglobin: 5.9 g/dL — CL (ref 12.0–15.0)
Immature Granulocytes: 1 %
Lymphocytes Relative: 5 %
Lymphs Abs: 0.4 10*3/uL — ABNORMAL LOW (ref 0.7–4.0)
MCH: 26.7 pg (ref 26.0–34.0)
MCHC: 28.4 g/dL — ABNORMAL LOW (ref 30.0–36.0)
MCV: 94.1 fL (ref 80.0–100.0)
Monocytes Absolute: 1.2 10*3/uL — ABNORMAL HIGH (ref 0.1–1.0)
Monocytes Relative: 14 %
Neutro Abs: 6.7 10*3/uL (ref 1.7–7.7)
Neutrophils Relative %: 80 %
Platelets: 386 10*3/uL (ref 150–400)
RBC: 2.21 MIL/uL — ABNORMAL LOW (ref 3.87–5.11)
RDW: 15.2 % (ref 11.5–15.5)
WBC: 8.4 10*3/uL (ref 4.0–10.5)
nRBC: 5.3 % — ABNORMAL HIGH (ref 0.0–0.2)

## 2023-09-16 LAB — BLOOD GAS, ARTERIAL
Acid-Base Excess: 2.2 mmol/L — ABNORMAL HIGH (ref 0.0–2.0)
Bicarbonate: 25.1 mmol/L (ref 20.0–28.0)
Drawn by: 37588
O2 Saturation: 99.9 %
Patient temperature: 36.4
pCO2 arterial: 29 mmHg — ABNORMAL LOW (ref 32–48)
pH, Arterial: 7.54 — ABNORMAL HIGH (ref 7.35–7.45)
pO2, Arterial: 177 mmHg — ABNORMAL HIGH (ref 83–108)

## 2023-09-16 LAB — COMPREHENSIVE METABOLIC PANEL WITH GFR
ALT: 16 U/L (ref 0–44)
AST: 25 U/L (ref 15–41)
Albumin: 3.1 g/dL — ABNORMAL LOW (ref 3.5–5.0)
Alkaline Phosphatase: 64 U/L (ref 38–126)
Anion gap: 13 (ref 5–15)
BUN: 41 mg/dL — ABNORMAL HIGH (ref 8–23)
CO2: 22 mmol/L (ref 22–32)
Calcium: 8.1 mg/dL — ABNORMAL LOW (ref 8.9–10.3)
Chloride: 89 mmol/L — ABNORMAL LOW (ref 98–111)
Creatinine, Ser: 1.21 mg/dL — ABNORMAL HIGH (ref 0.44–1.00)
GFR, Estimated: 44 mL/min — ABNORMAL LOW (ref >60)
Glucose, Bld: 137 mg/dL — ABNORMAL HIGH (ref 70–99)
Potassium: 4.3 mmol/L (ref 3.5–5.1)
Sodium: 124 mmol/L — ABNORMAL LOW (ref 135–145)
Total Bilirubin: 0.7 mg/dL (ref 0.0–1.2)
Total Protein: 6.4 g/dL — ABNORMAL LOW (ref 6.5–8.1)

## 2023-09-16 LAB — MRSA NEXT GEN BY PCR, NASAL: MRSA by PCR Next Gen: NOT DETECTED

## 2023-09-16 LAB — TROPONIN I (HIGH SENSITIVITY)
Troponin I (High Sensitivity): 22 ng/L — ABNORMAL HIGH (ref <18)
Troponin I (High Sensitivity): 19 ng/L — ABNORMAL HIGH (ref <18)

## 2023-09-16 LAB — POC OCCULT BLOOD, ED: Fecal Occult Bld: POSITIVE — AB

## 2023-09-16 LAB — ABO/RH: ABO/RH(D): A POS

## 2023-09-16 LAB — BRAIN NATRIURETIC PEPTIDE: B Natriuretic Peptide: 636 pg/mL — ABNORMAL HIGH (ref 0.0–100.0)

## 2023-09-16 LAB — PREPARE RBC (CROSSMATCH)

## 2023-09-16 MED ORDER — LEVOTHYROXINE SODIUM 75 MCG PO TABS
75.0000 ug | ORAL_TABLET | Freq: Every day | ORAL | Status: DC
  Administered 2023-09-17 – 2023-09-22 (×6): 75 ug via ORAL
  Filled 2023-09-16 (×6): qty 1

## 2023-09-16 MED ORDER — CHLORHEXIDINE GLUCONATE CLOTH 2 % EX PADS
6.0000 | MEDICATED_PAD | Freq: Every day | CUTANEOUS | Status: DC
  Administered 2023-09-16 – 2023-09-21 (×5): 6 via TOPICAL

## 2023-09-16 MED ORDER — ONDANSETRON HCL 4 MG PO TABS: 4.0000 mg | ORAL_TABLET | Freq: Four times a day (QID) | ORAL | Status: DC | PRN

## 2023-09-16 MED ORDER — ALPRAZOLAM 0.25 MG PO TABS
0.1250 mg | ORAL_TABLET | Freq: Every evening | ORAL | Status: DC | PRN
  Administered 2023-09-17 – 2023-09-21 (×5): 0.125 mg via ORAL
  Filled 2023-09-16 (×5): qty 1

## 2023-09-16 MED ORDER — FUROSEMIDE 10 MG/ML IJ SOLN
40.0000 mg | Freq: Once | INTRAMUSCULAR | Status: AC
  Administered 2023-09-16: 40 mg via INTRAVENOUS
  Filled 2023-09-16: qty 4

## 2023-09-16 MED ORDER — SODIUM CHLORIDE 0.9 % IV SOLN
3.0000 g | Freq: Two times a day (BID) | INTRAVENOUS | Status: DC
  Administered 2023-09-16: 3 g via INTRAVENOUS
  Filled 2023-09-16 (×4): qty 8

## 2023-09-16 MED ORDER — IPRATROPIUM-ALBUTEROL 0.5-2.5 (3) MG/3ML IN SOLN: 3.0000 mL | Freq: Four times a day (QID) | RESPIRATORY_TRACT | Status: DC

## 2023-09-16 MED ORDER — IPRATROPIUM BROMIDE 0.02 % IN SOLN: 0.5000 mg | Freq: Four times a day (QID) | RESPIRATORY_TRACT | Status: DC

## 2023-09-16 MED ORDER — SODIUM CHLORIDE 0.9% IV SOLUTION: Freq: Once | INTRAVENOUS | Status: AC

## 2023-09-16 MED ORDER — PANTOPRAZOLE SODIUM 40 MG IV SOLR
40.0000 mg | Freq: Once | INTRAVENOUS | Status: AC
  Administered 2023-09-16: 40 mg via INTRAVENOUS
  Filled 2023-09-16: qty 10

## 2023-09-16 MED ORDER — ACETAMINOPHEN 325 MG PO TABS
650.0000 mg | ORAL_TABLET | Freq: Four times a day (QID) | ORAL | Status: DC | PRN
  Administered 2023-09-19 (×2): 650 mg via ORAL
  Filled 2023-09-16 (×2): qty 2

## 2023-09-16 MED ORDER — ALBUTEROL SULFATE (2.5 MG/3ML) 0.083% IN NEBU: 3.0000 mL | INHALATION_SOLUTION | RESPIRATORY_TRACT | Status: DC | PRN

## 2023-09-16 MED ORDER — ACETAMINOPHEN 650 MG RE SUPP: 650.0000 mg | Freq: Four times a day (QID) | RECTAL | Status: DC | PRN

## 2023-09-16 MED ORDER — ONDANSETRON HCL 4 MG/2ML IJ SOLN: 4.0000 mg | Freq: Four times a day (QID) | INTRAMUSCULAR | Status: DC | PRN

## 2023-09-16 MED ORDER — METOPROLOL SUCCINATE ER 25 MG PO TB24
25.0000 mg | ORAL_TABLET | Freq: Every day | ORAL | Status: DC
Start: 2023-09-17 — End: 2023-09-18
  Administered 2023-09-17 – 2023-09-18 (×2): 25 mg via ORAL
  Filled 2023-09-16 (×2): qty 1

## 2023-09-16 MED ORDER — FUROSEMIDE 10 MG/ML IJ SOLN: 40.0000 mg | Freq: Three times a day (TID) | INTRAMUSCULAR | Status: DC

## 2023-09-16 MED ORDER — ALBUTEROL SULFATE (2.5 MG/3ML) 0.083% IN NEBU: 2.5000 mg | INHALATION_SOLUTION | Freq: Four times a day (QID) | RESPIRATORY_TRACT | Status: DC

## 2023-09-16 MED ORDER — LEVALBUTEROL HCL 0.63 MG/3ML IN NEBU: 0.6300 mg | INHALATION_SOLUTION | Freq: Four times a day (QID) | RESPIRATORY_TRACT | Status: DC | PRN

## 2023-09-16 MED ORDER — EMPTY CONTAINERS FLEXIBLE MISC
900.0000 mg | Freq: Once | Status: AC
  Administered 2023-09-16: 900 mg via INTRAVENOUS
  Filled 2023-09-16: qty 90

## 2023-09-16 MED ORDER — PANTOPRAZOLE SODIUM 40 MG IV SOLR
40.0000 mg | Freq: Four times a day (QID) | INTRAVENOUS | Status: DC
  Administered 2023-09-16 – 2023-09-17 (×3): 40 mg via INTRAVENOUS
  Filled 2023-09-16 (×3): qty 10

## 2023-09-16 NOTE — Consult Note (Signed)
 Pharmacy Antibiotic Note  Shelly Fields is a 89 y.o. female admitted on 09/16/2023 with aspiration pneumonia . Chest x-ray shows mild right pleural effusion is noted with associated right basilar atelectasis or infiltrate. Pharmacy has been consulted for Unasyn dosing.  Plan: SCr 1.21 with CrCl of 24.8 mL/min  Start Unasyn 3 gm IV Q12H based on current renal function  Pharmacy will continue to monitor and dose adjust appropriately   Height: 5\' 2"  (157.5 cm) Weight: 47 kg (103 lb 9.6 oz) IBW/kg (Calculated) : 50.1  Temp (24hrs), Avg:97.5 F (36.4 C), Min:97.5 F (36.4 C), Max:97.5 F (36.4 C)  Recent Labs  Lab 09/16/23 1645  WBC 8.4  CREATININE 1.21*    Estimated Creatinine Clearance: 24.8 mL/min (A) (by C-G formula based on SCr of 1.21 mg/dL (H)).    No Known Allergies  Antimicrobials this admission: Unasyn 4/10 >>   Dose adjustments this admission:  Microbiology results:  Thank you for allowing pharmacy to be a part of this patient's care.  Littie Deeds, PharmD Pharmacy Resident  09/16/2023 7:04 PM

## 2023-09-16 NOTE — Progress Notes (Signed)
 eLink Physician-Brief Progress Note Patient Name: Shelly Fields DOB: 1935/02/16 MRN: [id number]   Date of Service  09/16/2023  HPI/Events of Note  Patient with persistent atrial fibrillation on Xarelto admitted with GI bleeding and acute blood loss anemia.  eICU Interventions  New Patient Evaluation.        Thomasene Lot Cowan Pilar 09/16/2023, 10:40 PM

## 2023-09-16 NOTE — ED Provider Notes (Signed)
 [place] EMERGENCY DEPARTMENT AT [facility] Provider Note   CSN: [id number] Arrival date & time: 09/16/23  1540     History  Chief Complaint  Patient presents with   Shortness of Breath    Per EMS O2 sats in the 60s on RA upon arrival. Pt lethargic.    Shelly Fields is a 89 y.o. female.  Patient has a history of atrial fibs and takes Xarelto.  She comes in here short of breath and weak.  The history is provided by the patient and medical records. No language interpreter was used.  Shortness of Breath Severity:  Moderate Onset quality:  Sudden Timing:  Constant Progression:  Worsening Chronicity:  Recurrent Context: activity   Relieved by:  Nothing Worsened by:  Nothing Ineffective treatments:  None tried Associated symptoms: no abdominal pain, no chest pain, no cough, no headaches and no rash        Home Medications Prior to Admission medications   Medication Sig Start Date End Date Taking? Authorizing Provider  acetaminophen (TYLENOL) 500 MG tablet Take 2 tablets (1,000 mg total) by mouth every 6 (six) hours for 7 days. Patient taking differently: Take 500 mg by mouth every 6 (six) hours. 09/13/23 09/20/23 Yes Pappayliou, Arie Begin, DO  ALPRAZolam (XANAX) 0.25 MG tablet Take 0.125-0.25 mg by mouth daily as needed for anxiety or sleep. 07/22/23  Yes [provider]  Carboxymethylcellulose Sodium (THERATEARS OP) Place 1 drop into both eyes daily.    Yes [provider]  dapagliflozin propanediol (FARXIGA) 10 MG TABS tablet Take 1 tablet (10 mg total) by mouth daily before breakfast. 12/15/22  Yes Gerard Knight, MD  denosumab (PROLIA) 60 MG/ML SOSY injection Inject 60 mg into the skin every 6 (six) months.   Yes [provider]  docusate sodium (COLACE) 100 MG capsule Take 1 capsule (100 mg total) by mouth 2 (two) times daily. 09/13/23 09/12/24 Yes Pappayliou, Bynum Cassis A, DO  ferrous gluconate (FERGON) 324 MG tablet Take 1 tablet (324  mg total) by mouth every other day. 03/25/23 09/01/24 Yes Ahmed, Muhammad F, MD  levothyroxine (SYNTHROID) 75 MCG tablet Take 75 mcg by mouth daily before breakfast.   Yes [provider]  magnesium hydroxide (MILK OF MAGNESIA) 400 MG/5ML suspension Take 15 mLs by mouth daily as needed for mild constipation.   Yes [provider]  metoprolol succinate (TOPROL-XL) 50 MG 24 hr tablet TAKE 1 AND 1/2 TABLETS BY MOUTH  TWICE DAILY 04/08/23  Yes Gerard Knight, MD  potassium chloride SA (KLOR-CON M) 20 MEQ tablet Take 1 tablet (20 mEq total) by mouth daily. 03/10/23  Yes Gerard Knight, MD  psyllium (METAMUCIL) 58.6 % packet Take 1 packet by mouth 2 (two) times daily. Patient taking differently: Take 1 packet by mouth at bedtime. 03/25/23  Yes Ahmed, Muhammad F, MD  Rivaroxaban (XARELTO) 15 MG TABS tablet Take 1 tablet (15 mg total) by mouth daily with supper. 09/14/23  Yes Pappayliou, Bynum Cassis A, DO  torsemide (DEMADEX) 20 MG tablet Take 2 tablets (40 mg total) by mouth daily. 09/15/23  Yes Gerard Knight, MD  oxyCODONE (ROXICODONE) 5 MG immediate release tablet Take 1 tablet (5 mg total) by mouth every 6 (six) hours as needed. Patient taking differently: Take 5 mg by mouth every 6 (six) hours as needed for moderate pain (pain score 4-6). 09/13/23   Pappayliou, Arie Begin, DO      Allergies    Patient has no known  allergies.    Review of Systems   Review of Systems  Constitutional:  Negative for appetite change and fatigue.  HENT:  Negative for congestion, ear discharge and sinus pressure.   Eyes:  Negative for discharge.  Respiratory:  Positive for shortness of breath. Negative for cough.   Cardiovascular:  Negative for chest pain.  Gastrointestinal:  Negative for abdominal pain and diarrhea.  Genitourinary:  Negative for frequency and hematuria.  Musculoskeletal:  Negative for back pain.  Skin:  Negative for rash.  Neurological:  Negative for seizures and headaches.   Psychiatric/Behavioral:  Negative for hallucinations.     Physical Exam Updated Vital Signs BP 112/68   Pulse 75   Temp (!) 97.5 F (36.4 C) (Oral)   Resp 15   Ht 5\' 2"  (1.575 m)   Wt 47 kg   SpO2 98%   BMI 18.95 kg/m  Physical Exam Vitals reviewed.  Constitutional:      Appearance: She is well-developed.  HENT:     Head: Normocephalic.     Nose: Nose normal.  Eyes:     General: No scleral icterus.    Conjunctiva/sclera: Conjunctivae normal.  Neck:     Thyroid: No thyromegaly.  Cardiovascular:     Rate and Rhythm: Normal rate and regular rhythm.     Heart sounds: No murmur heard.    No friction rub. No gallop.  Pulmonary:     Breath sounds: No stridor. Rales present. No wheezing.  Chest:     Chest wall: No tenderness.  Abdominal:     General: There is no distension.     Tenderness: There is no abdominal tenderness. There is no rebound.  Genitourinary:    Comments: Rectal exam shows dark brown stool with heme positive Musculoskeletal:        General: Normal range of motion.     Cervical back: Neck supple.  Lymphadenopathy:     Cervical: No cervical adenopathy.  Skin:    Findings: No erythema or rash.  Neurological:     Mental Status: She is alert and oriented to person, place, and time.     Motor: No abnormal muscle tone.     Coordination: Coordination normal.  Psychiatric:        Behavior: Behavior normal.     ED Results / Procedures / Treatments   Labs (all labs ordered are listed, but only abnormal results are displayed) Labs Reviewed  CBC WITH DIFFERENTIAL/PLATELET - Abnormal; Notable for the following components:      Result Value   RBC 2.21 (*)    Hemoglobin 5.9 (*)    HCT 20.8 (*)    MCHC 28.4 (*)    nRBC 5.3 (*)    Lymphs Abs 0.4 (*)    Monocytes Absolute 1.2 (*)    All other components within normal limits  COMPREHENSIVE METABOLIC PANEL WITH GFR - Abnormal; Notable for the following components:   Sodium 124 (*)    Chloride 89 (*)     Glucose, Bld 137 (*)    BUN 41 (*)    Creatinine, Ser 1.21 (*)    Calcium 8.1 (*)    Total Protein 6.4 (*)    Albumin 3.1 (*)    GFR, Estimated 44 (*)    All other components within normal limits  BRAIN NATRIURETIC PEPTIDE - Abnormal; Notable for the following components:   B Natriuretic Peptide 636.0 (*)    All other components within normal limits  POC OCCULT BLOOD, ED - Abnormal;  Notable for the following components:   Fecal Occult Bld POSITIVE (*)    All other components within normal limits  TROPONIN I (HIGH SENSITIVITY) - Abnormal; Notable for the following components:   Troponin I (High Sensitivity) 22 (*)    All other components within normal limits  BLOOD GAS, ARTERIAL  POC OCCULT BLOOD, ED  PREPARE RBC (CROSSMATCH)  TROPONIN I (HIGH SENSITIVITY)    EKG None  Radiology DG Chest 2 View Result Date: 09/16/2023 CLINICAL DATA:  Shortness of breath. EXAM: CHEST - 2 VIEW COMPARISON:  November 23, 2022. FINDINGS: Stable cardiomediastinal silhouette. Status post kyphoplasty of mid thoracic vertebral body. Mild right pleural effusion is noted with associated right basilar atelectasis or infiltrate. Left lung is unremarkable. IMPRESSION: Mild right pleural effusion is noted with associated right basilar atelectasis or infiltrate. Electronically Signed   By: Rosalene Colon M.D.   On: 09/16/2023 18:33    Procedures Procedures    Medications Ordered in ED Medications  albuterol (PROVENTIL) (2.5 MG/3ML) 0.083% nebulizer solution 3 mL (has no administration in time range)  coag fact Xa recombinant (ANDEXXA) low dose infusion 900 mg (has no administration in time range)  0.9 %  sodium chloride infusion (Manually program via Guardrails IV Fluids) (has no administration in time range)  pantoprazole (PROTONIX) injection 40 mg (has no administration in time range)    ED Course/ Medical Decision Making/ A&P   CRITICAL CARE Performed by: Cheyenne Cotta Total critical care time: 54  minutes Critical care time was exclusive of separately billable procedures and treating other patients. Critical care was necessary to treat or prevent imminent or life-threatening deterioration. Critical care was time spent personally by me on the following activities: development of treatment plan with patient and/or surrogate as well as nursing, discussions with consultants, evaluation of patient's response to treatment, examination of patient, obtaining history from patient or surrogate, ordering and performing treatments and interventions, ordering and review of laboratory studies, ordering and review of radiographic studies, pulse oximetry and re-evaluation of patient's condition.   I spoke with with Dr. Sammi Crick and he said that the patient needs to be n.p.o. and they will see the patient tomorrow Click here for ABCD2, HEART and other calculatorsREFRESH Note before signing :1}                              Medical Decision Making Amount and/or Complexity of Data Reviewed Labs: ordered. Radiology: ordered.  Risk Prescription drug management. Decision regarding hospitalization.  This patient presents to the ED for concern of shortness of breath, this involves an extensive number of treatment options, and is a complaint that carries with it a high risk of complications and morbidity.  The differential diagnosis includes pneumonia, congestive heart failure   Co morbidities that complicate the patient evaluation Atrial fibrillation   Additional history obtained:  Additional history obtained from patient External records from outside source obtained and reviewed including hospital records   Lab Tests:  I Ordered, and personally interpreted labs.  The pertinent results include: Hemoglobin 5.9   Imaging Studies ordered:  I ordered imaging studies including chest x-ray I independently visualized and interpreted imaging which showed lateral fusion I agree with the radiologist  interpretation   Cardiac Monitoring: / EKG:  The patient was maintained on a cardiac monitor.  I personally viewed and interpreted the cardiac monitored which showed an underlying rhythm of: Normal sinus rhythm   Problem List /  ED Course / Critical interventions / Medication management  Atrial fibrillation and shortness of breath with anemia I ordered medication including packed red blood cells for anemia Reevaluation of the patient after these medicines showed that the patient improved I have reviewed the patients home medicines and have made adjustments as needed   Consultations Obtained:  I requested consultation with the GI and hospitalist,  and discussed lab and imaging findings as well as pertinent plan - they recommend: Hospitalist admit.  Patient will be transfused packed red blood cells and GI will consult in the morning   Social Determinants of Health:  None   Test / Admission - Considered:  None  Patient with anemia and GI bleed and congestive heart failure.  She will be admitted to medicine with GI consult.  Patient is being transfused 2 units of packed red blood cells        Final Clinical Impression(s) / ED Diagnoses Final diagnoses:  Other iron deficiency anemia  Gastrointestinal hemorrhage, unspecified gastrointestinal hemorrhage type    Rx / DC Orders ED Discharge Orders     None         Cheyenne Cotta, MD 09/18/23 1107

## 2023-09-16 NOTE — ED Triage Notes (Signed)
 Pt BIB [facility] for [facility]. Per EMS the pt O2 sats were in the 60s on RA and pt lethargic. Pt arrived on 6L with sats reading in the low 90s. Pt recently had skin CA removed from rt calf and has been declining since then.

## 2023-09-16 NOTE — ED Notes (Signed)
 Pt reports taking her 80mg  Lasix this morning but has not voided much, only twice since taking and only minimal amounts.

## 2023-09-16 NOTE — H&P (Addendum)
 [facility] H&P   Patient Demographics:    Shelly Fields, is a 89 y.o. female  MRN: [id number]   DOB - 1935/02/20  Admit Date - 09/16/2023  Outpatient Primary MD for the patient is Ignatius Specking, MD  Referring MD/NP/PA: Dr Estell Harpin  Patient coming from: home  Chief Complaint  Patient presents with   Shortness of Breath    Per EMS O2 sats in the 60s on RA upon arrival. Pt lethargic.      HPI:    Shelly Fields  is a 89 y.o. female,   with history of permanent Afib, pulmonology, HFpEF cor pulmonale, unable bronchiectasis, hypothyroidism and HTN.   -Presents to ED secondary to generalized weakness, fatigue and lethargy, denies any chest pain, fall, fever, chills, patient on Xarelto for A-fib, patient with known history of hemorrhoids, diverticulosis, gastritis with H. pylori during recent endoscopy and colonoscopy on October 2024, patient herself denies any coffee-ground emesis, melena or bright red blood per rectum, patient recently had to hold her Demadex for squamous cell resection surgery last Monday, she was told by her PCP to resume with increased doses yesterday as she is having significant edema and fluid retention. - In ED her hemoglobin came back low at 5.9, she was Hemoccult positive, so she received reversal agent Andexxa for Xarelto, ordered 3 units, she is dyspneic, hypoxic, chest x-ray significant for pleural effusion, opacity and volume overload, started on IV Lasix and antibiotics, [facility] hospitalist consulted to admit.  Review of systems:     A full 10 point Review of Systems was done, except as stated above, all other Review of Systems were negative.   With Past History of the following :    Past Medical History:  Diagnosis Date   Cor pulmonale (chronic) (HCC)    Diverticulosis    Essential hypertension    Hypothyroidism    Internal hemorrhoids    Persistent atrial  fibrillation (HCC)    Pulmonary hypertension (HCC)    Seasonal allergies    Shingles       Past Surgical History:  Procedure Laterality Date   ABDOMINAL HYSTERECTOMY     BIOPSY  03/25/2023   Procedure: BIOPSY;  Surgeon: Franky Macho, MD;  [facility]: [facility];  Service: Endoscopy;;   CATARACT EXTRACTION     COLONOSCOPY N/A 01/12/2013   Procedure: COLONOSCOPY;  Surgeon: Malissa Hippo, MD;  Location: [facility];  Service: Endoscopy;  Laterality: N/A;  1030   COLONOSCOPY N/A 11/18/2017   Procedure: COLONOSCOPY;  Surgeon: Malissa Hippo, MD;  [facility]: [facility];  Service: Endoscopy;  Laterality: N/A;   COLONOSCOPY WITH PROPOFOL N/A 03/25/2023   Procedure: COLONOSCOPY WITH PROPOFOL;  Surgeon: Franky Macho, MD;  Location: [facility];  Service: Endoscopy;  Laterality: N/A;  11:00AM;ASA 3   ESOPHAGOGASTRODUODENOSCOPY (EGD) WITH PROPOFOL N/A 03/25/2023   Procedure: ESOPHAGOGASTRODUODENOSCOPY (EGD) WITH PROPOFOL;  Surgeon: Franky Macho, MD;  [facility]: [facility];  Service: Endoscopy;  Laterality: N/A;  11:00;ASA 3   LESION REMOVAL Right 09/13/2023   Procedure: excision of right lower extremity squamous cell carcinoma;  Surgeon: Lewie Chamber, DO;  Location: [facility];  Service: General;  Laterality: Right;   POLYPECTOMY  11/18/2017   Procedure: POLYPECTOMY;  Surgeon: Malissa Hippo, MD;  Location: [facility];  Service: Endoscopy;;  colon      Social History:     Social History   Tobacco Use   Smoking status: Never    Passive exposure: Past   Smokeless tobacco: Never  Substance Use Topics   Alcohol use: No    Alcohol/week: 0.0 standard drinks of alcohol        Family History :     Family History  Problem Relation Age of Onset   Heart disease Mother    Hypertension Father    Colon cancer Neg Hx      Home Medications:   Prior to Admission medications   Medication Sig Start Date End Date Taking? Authorizing Provider  acetaminophen  (TYLENOL) 500 MG tablet Take 2 tablets (1,000 mg total) by mouth every 6 (six) hours for 7 days. Patient taking differently: Take 500 mg by mouth every 6 (six) hours. 09/13/23 09/20/23 Yes Pappayliou, Gustavus Messing, DO  ALPRAZolam (XANAX) 0.25 MG tablet Take 0.125-0.25 mg by mouth daily as needed for anxiety or sleep. 07/22/23  Yes [provider]  Carboxymethylcellulose Sodium (THERATEARS OP) Place 1 drop into both eyes daily.    Yes [provider]  dapagliflozin propanediol (FARXIGA) 10 MG TABS tablet Take 1 tablet (10 mg total) by mouth daily before breakfast. 12/15/22  Yes Jonelle Sidle, MD  denosumab (PROLIA) 60 MG/ML SOSY injection Inject 60 mg into the skin every 6 (six) months.   Yes [provider]  docusate sodium (COLACE) 100 MG capsule Take 1 capsule (100 mg total) by mouth 2 (two) times daily. 09/13/23 09/12/24 Yes Pappayliou, Santina Evans A, DO  ferrous gluconate (FERGON) 324 MG tablet Take 1 tablet (324 mg total) by mouth every other day. 03/25/23 09/01/24 Yes Ahmed, Juanetta Beets, MD  levothyroxine (SYNTHROID) 75 MCG tablet Take 75 mcg by mouth daily before breakfast.   Yes [provider]  magnesium hydroxide (MILK OF MAGNESIA) 400 MG/5ML suspension Take 15 mLs by mouth daily as needed for mild constipation.   Yes [provider]  metoprolol succinate (TOPROL-XL) 50 MG 24 hr tablet TAKE 1 AND 1/2 TABLETS BY MOUTH  TWICE DAILY 04/08/23  Yes Jonelle Sidle, MD  potassium chloride SA (KLOR-CON M) 20 MEQ tablet Take 1 tablet (20 mEq total) by mouth daily. 03/10/23  Yes Jonelle Sidle, MD  psyllium (METAMUCIL) 58.6 % packet Take 1 packet by mouth 2 (two) times daily. Patient taking differently: Take 1 packet by mouth at bedtime. 03/25/23  Yes Ahmed, Juanetta Beets, MD  Rivaroxaban (XARELTO) 15 MG TABS tablet Take 1 tablet (15 mg total) by mouth daily with supper. 09/14/23  Yes Pappayliou, Santina Evans A, DO  torsemide (DEMADEX) 20 MG tablet Take 2 tablets (40  mg total) by mouth daily. 09/15/23  Yes Jonelle Sidle, MD  oxyCODONE (ROXICODONE) 5 MG immediate release tablet Take 1 tablet (5 mg total) by mouth every 6 (six) hours as needed. Patient taking differently: Take 5 mg by mouth every 6 (six) hours as needed for moderate pain (pain score 4-6). 09/13/23   Pappayliou, Gustavus Messing,  DO     Allergies:    No Known Allergies   Physical Exam:   Vitals  Blood pressure 112/68, pulse 75, temperature (!) 97.5 F (36.4 C), temperature source Oral, resp. rate 15, height 5\' 2"  (1.575 m), weight 47 kg, SpO2 98%.   1. General Pale, chronically ill-appearing female, deconditioned  2. Normal affect and insight, Not Suicidal or Homicidal, Awake Alert, Oriented X 3.  3. No F.N deficits, ALL C.Nerves Intact, Strength 5/5 all 4 extremities, Sensation intact all 4 extremities, Plantars down going.  4. Ears and Eyes appear Normal, Conjunctivae clear, PERRLA. Moist Oral Mucosa.  5. Supple Neck, positive JVD, No cervical lymphadenopathy appriciated, No Carotid Bruits.  6. Symmetrical Chest wall movement, air entry at the bases with crackles  7.  irregular, No Gallops, Rubs or Murmurs, No Parasternal Heave.  3 edema  8. Positive Bowel Sounds, Abdomen Soft, No tenderness, No organomegaly appriciated,No rebound -guarding or rigidity.  9.  No Cyanosis, Normal Skin Turgor  10. Good muscle tone,  joints appear normal , no effusions, Normal ROM.  Left lat lower ext    Data Review:    CBC Recent Labs  Lab 09/16/23 1645  WBC 8.4  HGB 5.9*  HCT 20.8*  PLT 386  MCV 94.1  MCH 26.7  MCHC 28.4*  RDW 15.2  LYMPHSABS 0.4*  MONOABS 1.2*  EOSABS 0.0  BASOSABS 0.0   ------------------------------------------------------------------------------------------------------------------  Chemistries  Recent Labs  Lab 09/16/23 1645  NA 124*  K 4.3  CL 89*  CO2 22  GLUCOSE 137*  BUN 41*  CREATININE 1.21*  CALCIUM 8.1*  AST 25  ALT 16  ALKPHOS 64   BILITOT 0.7   ------------------------------------------------------------------------------------------------------------------ estimated creatinine clearance is 24.8 mL/min (A) (by C-G formula based on SCr of 1.21 mg/dL (H)). ------------------------------------------------------------------------------------------------------------------ No results for input(s): "TSH", "T4TOTAL", "T3FREE", "THYROIDAB" in the last 72 hours.  Invalid input(s): "FREET3"  Coagulation profile No results for input(s): "INR", "PROTIME" in the last 168 hours. ------------------------------------------------------------------------------------------------------------------- No results for input(s): "DDIMER" in the last 72 hours. -------------------------------------------------------------------------------------------------------------------  Cardiac Enzymes No results for input(s): "CKMB", "TROPONINI", "MYOGLOBIN" in the last 168 hours.  Invalid input(s): "CK" ------------------------------------------------------------------------------------------------------------------    Component Value Date/Time   BNP 636.0 (H) 09/16/2023 1645     ---------------------------------------------------------------------------------------------------------------  Urinalysis    Component Value Date/Time   COLORURINE STRAW (A) 01/20/2022 1847   APPEARANCEUR Clear 02/09/2023 0909   LABSPEC 1.005 01/20/2022 1847   PHURINE 7.0 01/20/2022 1847   GLUCOSEU 3+ (A) 02/09/2023 0909   HGBUR NEGATIVE 01/20/2022 1847   BILIRUBINUR Negative 02/09/2023 0909   KETONESUR NEGATIVE 01/20/2022 1847   PROTEINUR Negative 02/09/2023 0909   PROTEINUR NEGATIVE 01/20/2022 1847   NITRITE Negative 02/09/2023 0909   NITRITE NEGATIVE 01/20/2022 1847   LEUKOCYTESUR Trace (A) 02/09/2023 0909   LEUKOCYTESUR NEGATIVE 01/20/2022 1847     ----------------------------------------------------------------------------------------------------------------   Imaging Results:    DG Chest 2 View Result Date: 09/16/2023 CLINICAL DATA:  Shortness of breath. EXAM: CHEST - 2 VIEW COMPARISON:  November 23, 2022. FINDINGS: Stable cardiomediastinal silhouette. Status post kyphoplasty of mid thoracic vertebral body. Mild right pleural effusion is noted with associated right basilar atelectasis or infiltrate. Left lung is unremarkable. IMPRESSION: Mild right pleural effusion is noted with associated right basilar atelectasis or infiltrate. Electronically Signed   By: Lupita Raider M.D.   On: 09/16/2023 18:33     EKG:  PR interval * ms QRS duration 115 ms QT/QTcB 462/495 ms P-R-T axes * 111  57 Atrial fibrillation Incomplete right bundle branch block  Assessment & Plan:    Principal Problem:   Acute blood loss anemia Active Problems:   Paroxysmal atrial fibrillation (HCC)   Essential hypertension, benign   Pulmonary hypertension (HCC)   Bronchiolectasis (HCC)  assoc with possible MAI    Hypothyroidism, acquired   Grade IV hemorrhoids   Anasarca   Gastritis and gastroduodenitis   Acute blood loss anemia Symptomatic anemia GI bleed -Patient presents with hemoglobin of 5.9, symptomatic, she is ordered 3 units PRBC transfusion. - Xarelto on hold, she received Andexxa for reversal -Endoscopy, colonoscopy last October, significant for gastritis, H. pylori, diverticulosis with hemorrhoids -Will transfuse 3 units PRBC, monitor CBC closely. - Keep n.p.o. for now given respiratory status -GI consult -Continue with IV Protonix  Acute respiratory failure with hypoxia Duration of pneumonia/right lower lobe pneumonia Acute on chronic diastolic CHF Right heart failure/cor pulmonale - Patient currently on nonrebreather, with significant oxygen requirement. - Chest x-ray significant for volume overload, as well having elevated BNP, he  will be started on IV diuresis, repeat 2D echo.  Hold [name] for now - Right lung base opacity, she had an episode of vomiting Tuesday morning, will start on IV Unasyn - Blood pressure is soft, and if in need of more IV diuresis then will start on peripheral pressor support - Continue with NRB, if needed heated high flow nasal cannula, and may try BiPAP for a brief period of time. - As discussed with son, patient is DNR, no heroics, but okay to continue above measures like pressors via peripheral and BiPAP.Marland Kitchen  Essential hypertension.  - Continue with home metoprolol  Atrial fibrillation -Hold Xarelto due to above -Continue with Toprol-XL  Hypothyroidism -Continue with Synthroid  Hyponatremia - Due to volume overload, should improve with diuresis  AKI - Stable cardiorenal, monitor closely on IV diuresis  Squamous cell carcinoma status post recent resection on Monday -Wound looks stable     DVT Prophylaxis SCDs   AM Labs Ordered, also please review Full Orders  Family Communication: Admission, patients condition and plan of care including tests being ordered have been discussed with the patient and son at bedside* who indicate understanding and agree with the plan and Code Status.  Code Status DNR, confirmed by son  Likely DC to home home  Consults called: GI consult requested in epic  Admission status: Inpatient  Time spent in minutes : 70 minutes  The patient is critically ill with multi-organ failure.  Critical care was necessary to treat or prevent imminent or life-threatening deterioration ofacute respiratory failure, acute cardiac failure, acute renal failure and was exclusive of separately billable procedures and treating other patients. Total critical care time spent by me: 70 minutes Time spent personally by me on obtaining history from patient or surrogate, evaluation of the patient, evaluation of patient's response to treatment, ordering and review of laboratory  studies, ordering and review of radiographic studies, ordering and performing treatments and interventions, and re-evaluation of the patient's condition.   Huey Bienenstock M.D on 09/16/2023 at 7:29 PM   [facility] - Office  [phone]

## 2023-09-16 NOTE — ED Notes (Signed)
 EDP provider in the hallway at bedside during triage.

## 2023-09-16 NOTE — ED Notes (Signed)
 Son of Pt Shelly Fields stated he talked to his brother and if pt heart was to stop they do not want CPR. Message sent to EDP.

## 2023-09-16 NOTE — ED Notes (Signed)
 Attending at bedside talking with Trey Paula (son).

## 2023-09-16 NOTE — ED Provider Notes (Signed)
 I spoke to Mrs. [name] son and he agrees to a DNR order   Bethann Berkshire, MD 09/16/23 [phone]

## 2023-09-17 ENCOUNTER — Inpatient Hospital Stay (HOSPITAL_COMMUNITY)

## 2023-09-17 ENCOUNTER — Other Ambulatory Visit (HOSPITAL_COMMUNITY): Payer: Self-pay | Admitting: *Deleted

## 2023-09-17 DIAGNOSIS — E039 Hypothyroidism, unspecified: Secondary | ICD-10-CM

## 2023-09-17 DIAGNOSIS — I272 Pulmonary hypertension, unspecified: Secondary | ICD-10-CM | POA: Diagnosis not present

## 2023-09-17 DIAGNOSIS — D500 Iron deficiency anemia secondary to blood loss (chronic): Secondary | ICD-10-CM | POA: Diagnosis not present

## 2023-09-17 DIAGNOSIS — K643 Fourth degree hemorrhoids: Secondary | ICD-10-CM | POA: Diagnosis not present

## 2023-09-17 DIAGNOSIS — I2781 Cor pulmonale (chronic): Secondary | ICD-10-CM

## 2023-09-17 DIAGNOSIS — Z8619 Personal history of other infectious and parasitic diseases: Secondary | ICD-10-CM | POA: Diagnosis not present

## 2023-09-17 DIAGNOSIS — K2970 Gastritis, unspecified, without bleeding: Secondary | ICD-10-CM

## 2023-09-17 DIAGNOSIS — I5031 Acute diastolic (congestive) heart failure: Secondary | ICD-10-CM

## 2023-09-17 DIAGNOSIS — Z8719 Personal history of other diseases of the digestive system: Secondary | ICD-10-CM | POA: Diagnosis not present

## 2023-09-17 DIAGNOSIS — I4821 Permanent atrial fibrillation: Secondary | ICD-10-CM

## 2023-09-17 DIAGNOSIS — D62 Acute posthemorrhagic anemia: Secondary | ICD-10-CM | POA: Diagnosis not present

## 2023-09-17 DIAGNOSIS — K2990 Gastroduodenitis, unspecified, without bleeding: Secondary | ICD-10-CM

## 2023-09-17 LAB — ECHOCARDIOGRAM COMPLETE
AR max vel: 1.98 cm2
AV Area VTI: 2.19 cm2
AV Area mean vel: 2.09 cm2
AV Mean grad: 2 mmHg
AV Peak grad: 4 mmHg
Ao pk vel: 1 m/s
Area-P 1/2: 5.54 cm2
Height: 62 in
MV M vel: 5.06 m/s
MV Peak grad: 102.4 mmHg
S' Lateral: 2.3 cm
Weight: 1763.68 [oz_av]

## 2023-09-17 LAB — CBC
HCT: 31.2 % — ABNORMAL LOW (ref 36.0–46.0)
Hemoglobin: 9.7 g/dL — ABNORMAL LOW (ref 12.0–15.0)
MCH: 27.2 pg (ref 26.0–34.0)
MCHC: 31.1 g/dL (ref 30.0–36.0)
MCV: 87.6 fL (ref 80.0–100.0)
Platelets: 275 10*3/uL (ref 150–400)
RBC: 3.56 MIL/uL — ABNORMAL LOW (ref 3.87–5.11)
RDW: 15 % (ref 11.5–15.5)
WBC: 11 10*3/uL — ABNORMAL HIGH (ref 4.0–10.5)
nRBC: 8.4 % — ABNORMAL HIGH (ref 0.0–0.2)

## 2023-09-17 LAB — BASIC METABOLIC PANEL WITH GFR
Anion gap: 10 (ref 5–15)
BUN: 36 mg/dL — ABNORMAL HIGH (ref 8–23)
CO2: 23 mmol/L (ref 22–32)
Calcium: 7.6 mg/dL — ABNORMAL LOW (ref 8.9–10.3)
Chloride: 94 mmol/L — ABNORMAL LOW (ref 98–111)
Creatinine, Ser: 1 mg/dL (ref 0.44–1.00)
GFR, Estimated: 55 mL/min — ABNORMAL LOW (ref >60)
Glucose, Bld: 112 mg/dL — ABNORMAL HIGH (ref 70–99)
Potassium: 4 mmol/L (ref 3.5–5.1)
Sodium: 127 mmol/L — ABNORMAL LOW (ref 135–145)

## 2023-09-17 MED ORDER — MUSCLE RUB 10-15 % EX CREA
TOPICAL_CREAM | CUTANEOUS | Status: DC | PRN
  Administered 2023-09-17 – 2023-09-19 (×2): 1 via TOPICAL

## 2023-09-17 MED ORDER — SODIUM CHLORIDE 0.9 % IV SOLN
3.0000 g | Freq: Three times a day (TID) | INTRAVENOUS | Status: DC
  Administered 2023-09-17 – 2023-09-19 (×9): 3 g via INTRAVENOUS
  Filled 2023-09-17 (×8): qty 8
  Filled 2023-09-17: qty 3
  Filled 2023-09-17 (×5): qty 8

## 2023-09-17 MED ORDER — TAMSULOSIN HCL 0.4 MG PO CAPS
0.4000 mg | ORAL_CAPSULE | Freq: Every day | ORAL | Status: DC
  Administered 2023-09-17 – 2023-09-21 (×5): 0.4 mg via ORAL
  Filled 2023-09-17 (×5): qty 1

## 2023-09-17 MED ORDER — FUROSEMIDE 10 MG/ML IJ SOLN
40.0000 mg | Freq: Every day | INTRAMUSCULAR | Status: DC
  Administered 2023-09-17 – 2023-09-20 (×4): 40 mg via INTRAVENOUS
  Filled 2023-09-17 (×4): qty 4

## 2023-09-17 MED ORDER — PANTOPRAZOLE SODIUM 40 MG IV SOLR
40.0000 mg | Freq: Every day | INTRAVENOUS | Status: DC
  Administered 2023-09-18 – 2023-09-19 (×2): 40 mg via INTRAVENOUS
  Filled 2023-09-17 (×2): qty 10

## 2023-09-17 NOTE — Consult Note (Signed)
 Cardiology Consultation:   Patient ID: Shelly Fields; [id number]; March 08, 1935   Admit date: 09/16/2023 Date of Consult: 09/17/2023  Primary Care Provider: Ignatius Specking, MD Primary Cardiologist: Nona Dell, MD  History of Present Illness:   Shelly Fields is an 89 y.o. female with past medical history outlined below currently admitted to the hospital with symptomatic severe anemia, hemoglobin 5.9 and heme positive stools consistent with GI bleed.  She has had trouble with recent fluid retention as well, underwent local squamous cell cancer skin surgery on her right leg on April 7 under LMA anesthesia.  She has been on Xarelto 15 mg daily as an outpatient for stroke prophylaxis with atrial fibrillation, temporarily off for skin cancer surgery.  Anticoagulation has been reversed and discontinued for now in the setting of GI bleed.  She has been transfused 3 units of PRBCs with hemoglobin up to 9.7.  GI consultation and recommendations pending. She did have an EGD in October 2024 with biopsies positive for H. pylori gastritis.  Colonoscopy at that time showed left colonic diverticulosis, bleeding external hemorrhoids and nonbleeding internal hemorrhoids.  Chest x-ray shows a small right pleural effusion with no infiltrates.  She remains in atrial fibrillation with heart rate 90s to low 100s on Toprol-XL 25 mg daily.  Also receiving IV Lasix 40 mg daily with net diuresis of approximately 1600 cc so far.  Her outpatient weight in the clinic on March 20 was 98 pounds.  I met with the patient and family member in the room today.  She states that she is feeling better.  No palpitations.  No abdominal pain.  Has not noticed any bright red blood in her stools or bowel but has had "dark" stools.    ROS:  Pertinent review in history of present illness.  Chronic hearing loss.  No syncope.  No chest pain.  Past Medical History:  Diagnosis Date   Cor pulmonale (chronic) (HCC)    Diverticulosis     Essential hypertension    Hypothyroidism    Internal hemorrhoids    Persistent atrial fibrillation (HCC)    Pulmonary hypertension (HCC)    Seasonal allergies    Shingles     Past Surgical History:  Procedure Laterality Date   ABDOMINAL HYSTERECTOMY     BIOPSY  03/25/2023   Procedure: BIOPSY;  Surgeon: Franky Macho, MD;  Location: [facility];  Service: Endoscopy;;   CATARACT EXTRACTION     COLONOSCOPY N/A 01/12/2013   Procedure: COLONOSCOPY;  Surgeon: Malissa Hippo, MD;  Location: [facility];  Service: Endoscopy;  Laterality: N/A;  1030   COLONOSCOPY N/A 11/18/2017   Procedure: COLONOSCOPY;  Surgeon: Malissa Hippo, MD;  Location: [facility];  Service: Endoscopy;  Laterality: N/A;   COLONOSCOPY WITH PROPOFOL N/A 03/25/2023   Procedure: COLONOSCOPY WITH PROPOFOL;  Surgeon: Franky Macho, MD;  Location: [facility];  Service: Endoscopy;  Laterality: N/A;  11:00AM;ASA 3   ESOPHAGOGASTRODUODENOSCOPY (EGD) WITH PROPOFOL N/A 03/25/2023   Procedure: ESOPHAGOGASTRODUODENOSCOPY (EGD) WITH PROPOFOL;  Surgeon: Franky Macho, MD;  Location: [facility];  Service: Endoscopy;  Laterality: N/A;  11:00;ASA 3   LESION REMOVAL Right 09/13/2023   Procedure: excision of right lower extremity squamous cell carcinoma;  Surgeon: Lewie Chamber, DO;  Location: [facility];  Service: General;  Laterality: Right;   POLYPECTOMY  11/18/2017   Procedure: POLYPECTOMY;  Surgeon: Malissa Hippo, MD;  Location: [facility];  Service: Endoscopy;;  colon  Inpatient Medications: Scheduled Meds:  Chlorhexidine Gluconate Cloth  6 each Topical Daily   furosemide  40 mg Intravenous Daily   levothyroxine  75 mcg Oral QAC breakfast   metoprolol succinate  25 mg Oral Daily   pantoprazole (PROTONIX) IV  40 mg Intravenous Q6H   Continuous Infusions:  ampicillin-sulbactam (UNASYN) IV 3 g (09/17/23 1008)   PRN Meds: acetaminophen **OR** acetaminophen, ALPRAZolam, levalbuterol, Muscle  Rub, ondansetron **OR** ondansetron (ZOFRAN) IV  Allergies:   No Known Allergies  Social History:   Social History   Tobacco Use   Smoking status: Never    Passive exposure: Past   Smokeless tobacco: Never  Substance Use Topics   Alcohol use: No    Alcohol/week: 0.0 standard drinks of alcohol    Family History:   The patient's family history includes Heart disease in her mother; Hypertension in her father. There is no history of Colon cancer.  Physical Exam/Data:   Vitals:   09/17/23 0900 09/17/23 0935 09/17/23 1000 09/17/23 1159  BP: (!) 159/75  (!) 143/90   Pulse: (!) 44 (!) 120 88   Resp: (!) 25 (!) 27 (!) 24   Temp: 99.1 F (37.3 C) 99 F (37.2 C) 99 F (37.2 C) 98.4 F (36.9 C)  TempSrc:    Oral  SpO2: 90% (!) 86% 92%   Weight:      Height:        Intake/Output Summary (Last 24 hours) at 09/17/2023 1541 Last data filed at 09/17/2023 1322 Gross per 24 hour  Intake 1458.92 ml  Output 2500 ml  Net -1041.08 ml   Filed Weights   09/16/23 1617 09/16/23 2142  Weight: 47 kg 50 kg   Body mass index is 20.16 kg/m.   Gen: Frail-appearing elderly woman in no distress. HEENT: Conjunctiva and lids normal. Neck: Supple, no elevated JVP or carotid bruits. Lungs: Coarse but decreased breath sounds, no wheezing. Cardiac: Irregularly irregular, no gallop, soft systolic murmur. Abdomen: Soft, nontender, bowel sounds present. Extremities: Bilateral lower extremity edema. Skin: Warm and dry. Musculoskeletal: Kyphosis noted. Neuropsychiatric: Alert and oriented x3, affect grossly appropriate.  EKG:  An ECG dated 09/17/2023 was personally reviewed today and demonstrated:  Atrial fibrillation with incomplete right bundle branch block.  Telemetry:  I personally reviewed telemetry which shows atrial fibrillation.  Laboratory Data:  Chemistry Recent Labs  Lab 09/16/23 1645 09/17/23 0454  NA 124* 127*  K 4.3 4.0  CL 89* 94*  CO2 22 23  GLUCOSE 137* 112*  BUN 41* 36*   CREATININE 1.21* 1.00  CALCIUM 8.1* 7.6*  GFRNONAA 44* 55*  ANIONGAP 13 10    Recent Labs  Lab 09/16/23 1645  PROT 6.4*  ALBUMIN 3.1*  AST 25  ALT 16  ALKPHOS 64  BILITOT 0.7   Hematology Recent Labs  Lab 09/16/23 1645 09/17/23 0454  WBC 8.4 11.0*  RBC 2.21* 3.56*  HGB 5.9* 9.7*  HCT 20.8* 31.2*  MCV 94.1 87.6  MCH 26.7 27.2  MCHC 28.4* 31.1  RDW 15.2 15.0  PLT 386 275   Cardiac Enzymes Recent Labs  Lab 09/16/23 1645 09/16/23 1845  TROPONINIHS 22* 19*   BNP Recent Labs  Lab 09/16/23 1645  BNP 636.0*     Radiology/Studies:  ECHOCARDIOGRAM COMPLETE Result Date: 09/17/2023    ECHOCARDIOGRAM REPORT   Patient Name:   Shelly Fields Date of Exam: 09/17/2023 Medical Rec #:  [id number]      Height:       62.0 in  Accession #:    [id number]     Weight:       110.2 lb Date of Birth:  06-11-1934     BSA:          1.484 m Patient Age:    88 years       BP:           149/73 mmHg Patient Gender: F              HR:           85 bpm. [facility] Procedure: 2D Echo, Cardiac Doppler and Color Doppler (Both Spectral and Color            Flow Doppler were utilized during procedure). Indications:    CHF-Acute Diastolic I50.31  History:        Patient has prior history of Echocardiogram examinations, most                 recent 02/25/2022. Arrythmias:Atrial Fibrillation,                 Signs/Symptoms:Syncope; Risk Factors:Hypertension.  Sonographer:    Celesta Gentile RCS Referring Phys: [phone] DAWOOD S [place] IMPRESSIONS  1. Left ventricular ejection fraction, by estimation, is 70 to 75%. The left ventricle has hyperdynamic function. The left ventricle has no regional wall motion abnormalities. There is mild concentric left ventricular hypertrophy. Left ventricular diastolic parameters are indeterminate. There is the interventricular septum is flattened in systole and diastole, consistent with right ventricular pressure and volume overload.  2. Right ventricular systolic  function is mildly reduced. The right ventricular size is severely enlarged. There is normal pulmonary artery systolic pressure. The estimated right ventricular systolic pressure is 30.7 mmHg.  3. Left atrial size was moderately dilated.  4. Right atrial size was severely dilated.  5. Left pleural effusion present. There is a small to moderate pericardial effusion. The pericardial effusion is posterior to the left ventricle.  6. The mitral valve is degenerative. Mild to moderate mitral valve regurgitation.  7. The tricuspid valve is degenerative. Tricuspid valve regurgitation is moderate to severe.  8. The aortic valve is tricuspid. Aortic valve regurgitation is mild. No aortic stenosis is present.  9. The inferior vena cava is dilated in size with <50% respiratory variability, suggesting right atrial pressure of 15 mmHg. Comparison(s): Prior images reviewed side by side. LVEF vigorous at 70-75%. Severely dilated RV and RA with mild RV dysfunction and normal estimated RVSP. FINDINGS  Left Ventricle: Left ventricular ejection fraction, by estimation, is 70 to 75%. The left ventricle has hyperdynamic function. The left ventricle has no regional wall motion abnormalities. The left ventricular internal cavity size was normal in size. There is mild concentric left ventricular hypertrophy. The interventricular septum is flattened in systole and diastole, consistent with right ventricular pressure and volume overload. Left ventricular diastolic function could not be evaluated due to atrial fibrillation. Left ventricular diastolic parameters are indeterminate. Right Ventricle: The right ventricular size is severely enlarged. Right vetricular wall thickness was not well visualized. Right ventricular systolic function is mildly reduced. There is normal pulmonary artery systolic pressure. The tricuspid regurgitant velocity is 1.98 m/s, and with an assumed right atrial pressure of 15 mmHg, the estimated right ventricular  systolic pressure is 30.7 mmHg. Left Atrium: Left atrial size was moderately dilated. Right Atrium: Right atrial size was severely dilated. Pericardium: Left pleural effusion present. A small pericardial effusion is present. The pericardial effusion is posterior to the  left ventricle. There is no evidence of cardiac tamponade. Mitral Valve: The mitral valve is degenerative in appearance. There is mild thickening of the mitral valve leaflet(s). Mild to moderate mitral valve regurgitation, with posteriorly-directed jet. Tricuspid Valve: The tricuspid valve is degenerative in appearance. Tricuspid valve regurgitation is moderate to severe. Aortic Valve: The aortic valve is tricuspid. There is mild aortic valve annular calcification. Aortic valve regurgitation is mild. No aortic stenosis is present. Aortic valve mean gradient measures 2.0 mmHg. Aortic valve peak gradient measures 4.0 mmHg. Aortic valve area, by VTI measures 2.19 cm. Pulmonic Valve: The pulmonic valve was grossly normal. Pulmonic valve regurgitation is trivial. Aorta: The aortic root is normal in size and structure. Venous: The inferior vena cava is dilated in size with less than 50% respiratory variability, suggesting right atrial pressure of 15 mmHg. IAS/Shunts: No atrial level shunt detected by color flow Doppler. Additional Comments: 3D was performed not requiring image post processing on an independent workstation and was indeterminate.  LEFT VENTRICLE PLAX 2D LVIDd:         4.40 cm   Diastology LVIDs:         2.30 cm   LV e' medial:    8.38 cm/s LV PW:         1.10 cm   LV E/e' medial:  14.8 LV IVS:        1.00 cm   LV e' lateral:   11.30 cm/s LVOT diam:     1.90 cm   LV E/e' lateral: 11.0 LV SV:         43 LV SV Index:   29 LVOT Area:     2.84 cm  RIGHT VENTRICLE RV S prime:     11.90 cm/s TAPSE (M-mode): 1.8 cm LEFT ATRIUM              Index        RIGHT ATRIUM           Index LA diam:        3.60 cm  2.43 cm/m   RA Area:     51.20 cm LA Vol  (A2C):   108.0 ml 72.77 ml/m  RA Volume:   241.00 ml 162.39 ml/m LA Vol (A4C):   65.5 ml  44.14 ml/m LA Biplane Vol: 84.7 ml  57.07 ml/m  AORTIC VALVE AV Area (Vmax):    1.98 cm AV Area (Vmean):   2.09 cm AV Area (VTI):     2.19 cm AV Vmax:           99.60 cm/s AV Vmean:          65.100 cm/s AV VTI:            0.198 m AV Peak Grad:      4.0 mmHg AV Mean Grad:      2.0 mmHg LVOT Vmax:         69.50 cm/s LVOT Vmean:        48.100 cm/s LVOT VTI:          0.153 m LVOT/AV VTI ratio: 0.77  AORTA Ao Root diam: 3.60 cm MITRAL VALVE                TRICUSPID VALVE MV Area (PHT): 5.54 cm     TR Peak grad:   15.7 mmHg MV Decel Time: 137 msec     TR Vmax:        198.00 cm/s MR Peak grad: 102.4 mmHg MR Mean grad:  67.0 mmHg     SHUNTS MR Vmax:      506.00 cm/s   Systemic VTI:  0.15 m MR Vmean:     387.0 cm/s    Systemic Diam: 1.90 cm MV E velocity: 124.00 cm/s MV A velocity: 30.10 cm/s MV E/A ratio:  4.12 Nona Dell MD Electronically signed by Nona Dell MD Signature Date/Time: 09/17/2023/11:07:55 AM    Final    DG Chest 2 View Result Date: 09/16/2023 CLINICAL DATA:  Shortness of breath. EXAM: CHEST - 2 VIEW COMPARISON:  November 23, 2022. FINDINGS: Stable cardiomediastinal silhouette. Status post kyphoplasty of mid thoracic vertebral body. Mild right pleural effusion is noted with associated right basilar atelectasis or infiltrate. Left lung is unremarkable. IMPRESSION: Mild right pleural effusion is noted with associated right basilar atelectasis or infiltrate. Electronically Signed   By: Lupita Raider M.D.   On: 09/16/2023 18:33    Assessment and Plan:   1.  Permanent atrial fibrillation with CHA2DS2-VASc score of 4.  Patient presents with symptomatic severe anemia, hemoglobin initially 5.9.  Xarelto 15 mg daily was stopped and she was reversed with Andexxa.  Otherwise continues on Toprol XL 25 mg daily for heart rate control.  2.  HFpEF with LVEF 70 to 75% by follow-up echocardiogram.  Fluid status  also complicated by RV dysfunction with severely dilated right ventricle and mildly reduced contraction, at this point normal estimated RVSP although she has had evidence of intermittent pulmonary hypertension.  Most likely WHO group 2 and 3 (history of bronchiectasis).  Managed conservatively with medical therapy.  She has had more trouble with fluid retention recently, currently on IV Lasix and diuresing.  [name] held at the present time.  3.  Symptomatic severe anemia, initial hemoglobin 5.9 now status post transfusion of 3 units PRBCs with hemoglobin up to 9.7.  Reports "dark" stools suggestive of melena, heme positive.  She has a history of Helicobacter pylori gastritis and also internal and external hemorrhoids by GI workup in October of last year.  Follow-up GI consultation pending.  Agree with discontinuation and reversal of Xarelto, unlikely to be a candidate for continued anticoagulation.  Would be hesitant to pursue Watchman implantation as well at this point.  Continue Toprol-XL 25 mg daily for heart rate control.  Agree with IV Lasix currently 40 mg daily for diuresis.  Would not pursue aggressive workup of RV dysfunction (no right heart catheterization).  For questions or updates, please contact [facility] Please consult [email or web address] for contact info under   Signed, Nona Dell, MD  09/17/2023 3:41 PM

## 2023-09-17 NOTE — Consult Note (Signed)
 Gastroenterology Consult   Referring Provider: No ref. provider found Primary Care Physician:  Ignatius Specking, MD Primary Gastroenterologist: Vista Lawman, MD   Patient ID: Shelly Fields; [id number]; [birth year]   Admit date: 09/16/2023  LOS: 1 day   Date of Consultation: 09/17/2023  Reason for Consultation:  IDA  History of Present Illness   Shelly Fields is a 89 y.o. year old female with history of HTN, hypothyroidism, persistent A-fib on Xarelto, pulmonary hypertension, HFpEF, diverticulosis, and chronic cor pulmonale who presented to the ED for generalized weakness, fatigue, lethargy.  GI consulted for further evaluation  ED Course: Vitals: presented with hypoxia sats 60's-80s. BP stable. tachypneic  Labs: Hemoglobin 5.9, WBC 8.4, platelets 386, sodium 124, creatinine 1.21, albumin 3.1, GFR 44, BNP 636 Stool Hemoccult positive CXR with mild right pleural effusion is noted with associated right basilar atelectasis or infiltrate. 3u PRBC ordered  EGD October 2024: - Normal esophagus.  - Gastroesophageal flap valve classified as Hill Grade III ( minimal fold, loose to endoscope,hiatal hernia likely) .  - Gastritis. Biopsied.  - Normal duodenal bulb and second portion of the duodenum.  Colonoscopy October 2024: - Hemorrhoids found on perianal exam.  - Diverticulosis in the left colon.  - Bleeding external hemorrhoids.  - Non- bleeding internal hemorrhoids.  - The examined portion of the ileum was normal. - Likely cause of hematochezia and IDA is Hemorrhoidal bleed - Use p.o iron every other day, referred to colorectal surgery - anusol BID and preparation H for 5 days - high fiber diet and metamucil advised  Has been doing well overall from a GI standpoint.  Since her last procedure she has been taking Metamucil and having fairly well regular bowel movements.  Denies any abdominal pain, nausea, vomiting, dysphagia.  Has been having some increased fatigue and  decreased appetite over the last several days to 1-2 weeks.  She states all this has been going on ever since she had her procedure regarding the skin cancer on her leg.  She denies any melena or BRBPR at home.  Currently taking her iron every other day as advised.  Denies any frequent NSAID use.   Past Medical History:  Diagnosis Date   Cor pulmonale (chronic) (HCC)    Diverticulosis    Essential hypertension    Hypothyroidism    Internal hemorrhoids    Persistent atrial fibrillation (HCC)    Pulmonary hypertension (HCC)    Seasonal allergies    Shingles     Past Surgical History:  Procedure Laterality Date   ABDOMINAL HYSTERECTOMY     BIOPSY  03/25/2023   Procedure: BIOPSY;  Surgeon: Franky Macho, MD;  Location: [facility];  Service: Endoscopy;;   CATARACT EXTRACTION     COLONOSCOPY N/A 01/12/2013   Procedure: COLONOSCOPY;  Surgeon: Malissa Hippo, MD;  Location: [facility];  Service: Endoscopy;  Laterality: N/A;  1030   COLONOSCOPY N/A 11/18/2017   Procedure: COLONOSCOPY;  Surgeon: Malissa Hippo, MD;  Location: [facility];  Service: Endoscopy;  Laterality: N/A;   COLONOSCOPY WITH PROPOFOL N/A 03/25/2023   Procedure: COLONOSCOPY WITH PROPOFOL;  Surgeon: Franky Macho, MD;  Location: [facility];  Service: Endoscopy;  Laterality: N/A;  11:00AM;ASA 3   ESOPHAGOGASTRODUODENOSCOPY (EGD) WITH PROPOFOL N/A 03/25/2023   Procedure: ESOPHAGOGASTRODUODENOSCOPY (EGD) WITH PROPOFOL;  Surgeon: Franky Macho, MD;  Location: [facility];  Service: Endoscopy;  Laterality: N/A;  11:00;ASA 3   LESION REMOVAL Right  09/13/2023   Procedure: excision of right lower extremity squamous cell carcinoma;  Surgeon: Lewie Chamber, DO;  Location: [facility];  Service: General;  Laterality: Right;   POLYPECTOMY  11/18/2017   Procedure: POLYPECTOMY;  Surgeon: Malissa Hippo, MD;  Location: [facility];  Service: Endoscopy;;  colon    Prior to Admission medications    Medication Sig Start Date End Date Taking? Authorizing Provider  acetaminophen (TYLENOL) 500 MG tablet Take 2 tablets (1,000 mg total) by mouth every 6 (six) hours for 7 days. Patient taking differently: Take 500 mg by mouth every 6 (six) hours. 09/13/23 09/20/23 Yes Pappayliou, Gustavus Messing, DO  ALPRAZolam (XANAX) 0.25 MG tablet Take 0.125-0.25 mg by mouth daily as needed for anxiety or sleep. 07/22/23  Yes [provider]  Carboxymethylcellulose Sodium (THERATEARS OP) Place 1 drop into both eyes daily.    Yes [provider]  dapagliflozin propanediol (FARXIGA) 10 MG TABS tablet Take 1 tablet (10 mg total) by mouth daily before breakfast. 12/15/22  Yes Jonelle Sidle, MD  denosumab (PROLIA) 60 MG/ML SOSY injection Inject 60 mg into the skin every 6 (six) months.   Yes [provider]  docusate sodium (COLACE) 100 MG capsule Take 1 capsule (100 mg total) by mouth 2 (two) times daily. 09/13/23 09/12/24 Yes Pappayliou, Santina Evans A, DO  ferrous gluconate (FERGON) 324 MG tablet Take 1 tablet (324 mg total) by mouth every other day. 03/25/23 09/01/24 Yes Ahmed, Juanetta Beets, MD  levothyroxine (SYNTHROID) 75 MCG tablet Take 75 mcg by mouth daily before breakfast.   Yes [provider]  magnesium hydroxide (MILK OF MAGNESIA) 400 MG/5ML suspension Take 15 mLs by mouth daily as needed for mild constipation.   Yes [provider]  metoprolol succinate (TOPROL-XL) 50 MG 24 hr tablet TAKE 1 AND 1/2 TABLETS BY MOUTH  TWICE DAILY 04/08/23  Yes Jonelle Sidle, MD  potassium chloride SA (KLOR-CON M) 20 MEQ tablet Take 1 tablet (20 mEq total) by mouth daily. 03/10/23  Yes Jonelle Sidle, MD  psyllium (METAMUCIL) 58.6 % packet Take 1 packet by mouth 2 (two) times daily. Patient taking differently: Take 1 packet by mouth at bedtime. 03/25/23  Yes Ahmed, Juanetta Beets, MD  Rivaroxaban (XARELTO) 15 MG TABS tablet Take 1 tablet (15 mg total) by mouth daily with supper. 09/14/23  Yes  Pappayliou, Santina Evans A, DO  torsemide (DEMADEX) 20 MG tablet Take 2 tablets (40 mg total) by mouth daily. 09/15/23  Yes Jonelle Sidle, MD  oxyCODONE (ROXICODONE) 5 MG immediate release tablet Take 1 tablet (5 mg total) by mouth every 6 (six) hours as needed. Patient taking differently: Take 5 mg by mouth every 6 (six) hours as needed for moderate pain (pain score 4-6). 09/13/23   Pappayliou, Gustavus Messing, DO    Current Facility-Administered Medications  Medication Dose Route Frequency Provider Last Rate Last Admin   acetaminophen (TYLENOL) tablet 650 mg  650 mg Oral Q6H PRN Elgergawy, Leana Roe, MD       Or   acetaminophen (TYLENOL) suppository 650 mg  650 mg Rectal Q6H PRN Elgergawy, Leana Roe, MD       ALPRAZolam Prudy Feeler) tablet 0.125 mg  0.125 mg Oral QHS PRN Elgergawy, Leana Roe, MD       Ampicillin-Sulbactam (UNASYN) 3 g in sodium chloride 0.9 % 100 mL IVPB  3 g Intravenous Q8H Johnson, Clanford L, MD       Chlorhexidine Gluconate Cloth 2 % PADS 6 each  6 each Topical Daily Elgergawy, Leana Roe, MD   6 each at 09/16/23 2100   furosemide (LASIX) injection 40 mg  40 mg Intravenous Daily Johnson, Clanford L, MD       levalbuterol (XOPENEX) nebulizer solution 0.63 mg  0.63 mg Nebulization Q6H PRN Elgergawy, Leana Roe, MD       levothyroxine (SYNTHROID) tablet 75 mcg  75 mcg Oral QAC breakfast Elgergawy, Leana Roe, MD   75 mcg at 09/17/23 0516   metoprolol succinate (TOPROL-XL) 24 hr tablet 25 mg  25 mg Oral Daily Elgergawy, Leana Roe, MD       Muscle Rub CREA   Topical PRN Gertha Calkin, MD   1 Application at 09/17/23 0253   ondansetron (ZOFRAN) tablet 4 mg  4 mg Oral Q6H PRN Elgergawy, Leana Roe, MD       Or   ondansetron (ZOFRAN) injection 4 mg  4 mg Intravenous Q6H PRN Elgergawy, Leana Roe, MD       pantoprazole (PROTONIX) injection 40 mg  40 mg Intravenous Q6H Elgergawy, Leana Roe, MD   40 mg at 09/17/23 0516    Allergies as of 09/16/2023   (No Known Allergies)    Family History  Problem  Relation Age of Onset   Heart disease Mother    Hypertension Father    Colon cancer Neg Hx     Social History   Socioeconomic History   Marital status: Widowed    Spouse name: Not on file   Number of children: Not on file   Years of education: Not on file   Highest education level: Not on file  Occupational History   Not on file  Tobacco Use   Smoking status: Never    Passive exposure: Past   Smokeless tobacco: Never  Vaping Use   Vaping status: Never Used  Substance and Sexual Activity   Alcohol use: No    Alcohol/week: 0.0 standard drinks of alcohol   Drug use: No   Sexual activity: Not on file  Other Topics Concern   Not on file  Social History Narrative   Not on file   Social Drivers of Health   Financial Resource Strain: Not on file  Food Insecurity: No Food Insecurity (09/16/2023)   Hunger Vital Sign    Worried About Running Out of Food in the Last Year: Never true    Ran Out of Food in the Last Year: Never true  Transportation Needs: No Transportation Needs (09/16/2023)   PRAPARE - Administrator, Civil Service (Medical): No    Lack of Transportation (Non-Medical): No  Physical Activity: Not on file  Stress: Not on file  Social Connections: Moderately Integrated (09/16/2023)   Social Connection and Isolation Panel [NHANES]    Frequency of Communication with Friends and Family: More than three times a week    Frequency of Social Gatherings with Friends and Family: More than three times a week    Attends Religious Services: 1 to 4 times per year    Active Member of [employer] or Organizations: Patient declined    Attends Banker Meetings: 1 to 4 times per year    Marital Status: Widowed  Intimate Partner Violence: Not At Risk (09/16/2023)   Humiliation, Afraid, Rape, and Kick questionnaire    Fear of Current or Ex-Partner: No    Emotionally Abused: No    Physically Abused: No    Sexually Abused: No     Review of Systems  Gen: +  Fatigue.  Denies any fever, chills, loss of appetite, change in weight or weight loss CV: + Edema denies chest pain, heart palpitations, syncope. Resp: + Dyspnea at rest and exertion.  Denies cough, wheezing, coughing up blood, and pleurisy. GI: See HPI  GU : See HPI  Derm: + Wound to left lower extremity.  Denies rash, itching, dry skin, hives. Psych: Denies depression, anxiety, memory loss, hallucinations, and confusion. Neuro:  Denies any headaches, dizziness, paresthesias, shaking  Physical Exam   Vital Signs in last 24 hours: Temp:  [97.5 F (36.4 C)-99.3 F (37.4 C)] 99.1 F (37.3 C) (04/11 0800) Pulse Rate:  [46-97] 76 (04/11 0800) Resp:  [15-28] 18 (04/11 0800) BP: (102-165)/(41-88) 137/71 (04/11 0800) SpO2:  [66 %-100 %] 93 % (04/11 0852) Weight:  [47 kg-50 kg] 50 kg (04/10 2142) Last BM Date : 09/16/23 (PTA)  General:   Alert,  pleasant and cooperative. Frail Head:  Normocephalic and atraumatic. Eyes:  Sclera clear, no icterus.   Conjunctiva pink. Ears:  Normal auditory acuity. Mouth:  No deformity or lesions, dentition normal. Neck:  Supple; no masses Lungs:  Clear throughout to auscultation.   No wheezes, crackles, or rhonchi. No acute distress. Heart:  Regular rate and rhythm; no murmurs, clicks, rubs,  or gallops. Abdomen:  Soft, nontender and nondistended. No masses, hepatosplenomegaly or hernias noted. Normal bowel sounds, without guarding, and without rebound.   Rectal: deferred Extremities:  Without clubbing or edema. Wound covered to RLE Neurologic:  Alert and  oriented x4. Psych:  Alert and cooperative. Normal mood and affect.  Intake/Output from previous day: 04/10 0701 - 04/11 0700 In: 1458.9 [P.O.:300; Blood:968.9; IV Piggyback:190] Out: 900 [Urine:900] Intake/Output this shift: No intake/output data recorded.  Wt Readings from Last 3 Encounters:  09/16/23 50 kg  09/13/23 44.7 kg  09/08/23 44.7 kg    Labs/Studies   Recent Labs Recent Labs     09/16/23 1645 09/17/23 0454  WBC 8.4 11.0*  HGB 5.9* 9.7*  HCT 20.8* 31.2*  PLT 386 275   BMET Recent Labs    09/16/23 1645 09/17/23 0454  NA 124* 127*  K 4.3 4.0  CL 89* 94*  CO2 22 23  GLUCOSE 137* 112*  BUN 41* 36*  CREATININE 1.21* 1.00  CALCIUM 8.1* 7.6*   LFT Recent Labs    09/16/23 1645  PROT 6.4*  ALBUMIN 3.1*  AST 25  ALT 16  ALKPHOS 64  BILITOT 0.7   PT/INR No results for input(s): "LABPROT", "INR" in the last 72 hours. Hepatitis Panel No results for input(s): "HEPBSAG", "HCVAB", "HEPAIGM", "HEPBIGM" in the last 72 hours. C-Diff No results for input(s): "CDIFFTOX" in the last 72 hours.  Radiology/Studies DG Chest 2 View Result Date: 09/16/2023 CLINICAL DATA:  Shortness of breath. EXAM: CHEST - 2 VIEW COMPARISON:  November 23, 2022. FINDINGS: Stable cardiomediastinal silhouette. Status post kyphoplasty of mid thoracic vertebral body. Mild right pleural effusion is noted with associated right basilar atelectasis or infiltrate. Left lung is unremarkable. IMPRESSION: Mild right pleural effusion is noted with associated right basilar atelectasis or infiltrate. Electronically Signed   By: Lupita Raider M.D.   On: 09/16/2023 18:33    Assessment   Shelly Fields is a 89 y.o. year old female with history of HTN, hypothyroidism, persistent A-fib on Xarelto, pulmonary hypertension, HFpEF, diverticulosis, and chronic cor pulmonale who presented to the ED for generalized weakness, fatigue, lethargy.  GI consulted for further evaluation  Iron deficiency anemia:  Presented with hemoglobin of 5.9 with ongoing shortness of breath although respiratory decline likely secondary to pleural effusion.  Unclear at this time if hemoglobin check is accurate, received 3 units of PRBC however hemoglobin rose to 9.7 today, baseline has been in the 10 range.  EGD and colonoscopy in October 2024 with mild gastritis, nonbleeding internal hemorrhoids, and bleeding external hemorrhoids.   Remains on Xarelto for A-fib, she did receive reversal agent Andexxa in the ED.  Currently maintained in the ICU given dyspnea and oxygen desaturation.  Chest x-ray significant for pleural effusion, opacity, and volume overload.  Given concern for infection, she was started on antibiotics.  Likely somewhat dyspneic secondary to fluids and blood transfusion.  Currently DNR in the ICU, has had oxygen saturations from 66% - 100% on varying amounts of oxygen.  She desaturated to 87% with 6 L nasal cannula, remains on high flow nasal cannula currently.  Given respiratory status, would not consider any endoscopic procedures at this time.  In light of recently receiving EGD and colonoscopy in October 2024 without any overt signs of GI bleeding, would not perform any inpatient procedures at this time.  Likely slow chronic ooze from hemorrhoids over the last 3 months, appropriate response to transfusion. May need to consider hematology evaluation outpatient. Will assess for hemolytic anemia. Increasing iron to daily.   Plan / Recommendations   Diet as tolerated PPI BID Trend H/H, transfuse Hgb <7 Continue iron, increase to daily Could consider hematology evaluation outpatient. LDH and haptoglobin Needs to follow up with colorectal surgery.  Monitor for overt GI bleeding.      09/17/2023, 8:54 AM  Brooke Bonito, MSN, FNP-BC, AGACNP-BC [facility]

## 2023-09-17 NOTE — Plan of Care (Signed)
  Problem: Acute Rehab OT Goals (only OT should resolve) Goal: Pt. Will Perform Grooming Flowsheets (Taken 09/17/2023 1359) Pt Will Perform Grooming:  with modified independence  standing Goal: Pt. Will Perform Lower Body Bathing Flowsheets (Taken 09/17/2023 1359) Pt Will Perform Lower Body Bathing: with modified independence Goal: Pt. Will Perform Lower Body Dressing Flowsheets (Taken 09/17/2023 1359) Pt Will Perform Lower Body Dressing: with modified independence Goal: Pt. Will Perform Toileting-Clothing Manipulation Flowsheets (Taken 09/17/2023 1359) Pt Will Perform Toileting - Clothing Manipulation and hygiene: with modified independence Goal: Pt/Caregiver Will Perform Home Exercise Program Flowsheets (Taken 09/17/2023 1359) Pt/caregiver will Perform Home Exercise Program:  Increased strength  Both right and left upper extremity  Independently  Claudie Rathbone OT, MOT

## 2023-09-17 NOTE — Progress Notes (Signed)
 Foley was leaking significantly. Injected 10ml of saline in foley balloon. Will continue to monitor.

## 2023-09-17 NOTE — Progress Notes (Signed)
 PROGRESS NOTE   Shelly Fields  WJX:[id number] DOB: 1934/11/06 DOA: 09/16/2023 PCP: Shelly Specking, MD   Chief Complaint  Patient presents with   Shortness of Breath    Per EMS O2 sats in the 60s on RA upon arrival. Pt lethargic.   Level of care: ICU  Brief Admission History:  89 y.o. female with history of permanent Afib, pulmonology, HFpEF cor pulmonale, unable bronchiectasis, hypothyroidism and HTN presented to ED secondary to generalized weakness, fatigue and lethargy, denies chest pain, fall, fever, chills, patient on Xarelto for A-fib, patient with known history of hemorrhoids, diverticulosis, gastritis with H. pylori during recent endoscopy and colonoscopy on October 2024, patient herself denies any coffee-ground emesis, melena or bright red blood per rectum, patient recently had to hold her Demadex for squamous cell resection surgery last Monday, she was told by her PCP to resume with increased doses yesterday as she is having significant edema and fluid retention. - In ED her hemoglobin came back low at 5.9, she was Hemoccult positive, so she received reversal agent Andexxa for Xarelto, ordered 3 units PRBC, she was dyspneic, hypoxic, and in a frail weakened state,  chest x-ray significant for pleural effusion, opacity and volume overload, started on IV Lasix and antibiotics, Shelly Fields consulted to admit.   Assessment and Plan:  Acute blood loss anemia Symptomatic anemia Shelly bleed -Patient presents with hemoglobin of 5.9, symptomatic, she is ordered 3 units PRBC transfusion. -Xarelto on hold, she received Andexxa for reversal -Endoscopy, colonoscopy last October, significant for gastritis, H. pylori, diverticulosis with hemorrhoids - s/p transfused 3 units PRBC, monitor CBC closely. - advance diet as tolerated  -Shelly consult appreciated, for now follow Hg, recently had procedures, may need outpatient hematology consult  -Continue with IV Protonix   Acute respiratory failure  with hypoxia Duration of pneumonia/right lower lobe pneumonia Acute on chronic diastolic CHF Right heart failure/cor pulmonale - Patient off nonrebreather with much improvement - the underlying problem was urinary retention she had more than 500 cc in bladder - continue foley for now, add flomax with supper - Chest x-ray significant for volume overload, as well having elevated BNP, he will be started on IV diuresis, repeat 2D echo.  Hold Shelly Fields for now - Right lung base opacity, she had an episode of vomiting Tuesday morning, will start on IV Unasyn - Blood pressures stabilizing with diuresis - As discussed with son, patient is DNR, no heroics, but okay to continue above measures like pressors via peripheral and BiPAP.   Acute urinary retention  - foley cath placed with good urine output - continue foley for now - added flomax   Essential hypertension.  - Continue with home metoprolol   Atrial fibrillation -Hold Xarelto due to above -Continue with Toprol-XL   Hypothyroidism -Continue with Synthroid   Hyponatremia - Due to volume overload, should improve with diuresis   AKI - Stable cardiorenal, monitor closely on IV diuresis   Squamous cell carcinoma status post recent resection on Monday -Wound looks stable  DVT prophylaxis: SCDs Code Status: DNR  Family Communication: daughter in law present at bedside updated 4/11  Disposition: anticipate return home with Shelly Fields    Consultants:  Shelly Fields  Procedures:  None planned Antimicrobials:    Subjective: Pt says that she feels better after foley was placed.   Objective: Vitals:   09/17/23 0935 09/17/23 1000 09/17/23 1159 09/17/23 1510  BP:  (!) 143/90    Pulse: (!) 120 88    Resp: Marland Kitchen)  27 (!) 24    Temp: 99 F (37.2 C) 99 F (37.2 C) 98.4 F (36.9 C)   TempSrc:   Oral   SpO2: (!) 86% 92%  99%  Weight:      Height:        Intake/Output Summary (Last 24 hours) at 09/17/2023 1716 Last data filed at 09/17/2023  1322 Gross per 24 hour  Intake 1458.92 ml  Output 2500 ml  Net -1041.08 ml   Filed Weights   09/16/23 1617 09/16/23 2142  Weight: 47 kg 50 kg   Examination:  General exam: Appears calm and comfortable  Respiratory system: Clear to auscultation. Respiratory effort normal. Cardiovascular system: normal S1 & S2 heard. No JVD, murmurs, rubs, gallops or clicks. No pedal edema. Gastrointestinal system: Abdomen is nondistended, soft and nontender. No organomegaly or masses felt. Normal bowel sounds heard. Central nervous system: Alert and oriented. No focal neurological deficits. Extremities: Symmetric 5 x 5 power. Skin: No rashes, lesions or ulcers. Psychiatry: Judgement and insight appear normal. Mood & affect appropriate.   Data Reviewed: I have personally reviewed following labs and imaging studies  CBC: Recent Labs  Lab 09/16/23 1645 09/17/23 0454  WBC 8.4 11.0*  NEUTROABS 6.7  --   HGB 5.9* 9.7*  HCT 20.8* 31.2*  MCV 94.1 87.6  PLT 386 275    Basic Metabolic Panel: Recent Labs  Lab 09/16/23 1645 09/17/23 0454  NA 124* 127*  K 4.3 4.0  CL 89* 94*  CO2 22 23  GLUCOSE 137* 112*  BUN 41* 36*  CREATININE 1.21* 1.00  CALCIUM 8.1* 7.6*    CBG: No results for input(s): "GLUCAP" in the last 168 hours.  Recent Results (from the past 240 hours)  MRSA Next Gen by PCR, Nasal     Status: None   Collection Time: 09/16/23  7:30 PM   Specimen: Nasal Mucosa; Nasal Swab  Result Value Ref Range Status   MRSA by PCR Next Gen NOT DETECTED NOT DETECTED Final    Comment: (NOTE) The GeneXpert MRSA Assay (FDA approved for NASAL specimens only), is one component of a comprehensive MRSA colonization surveillance program. It is not intended to diagnose MRSA infection nor to guide or monitor treatment for MRSA infections. Test performance is not FDA approved in patients less than 48 years old. Performed at [facility], [address] [place], [place] [address]      Radiology  Studies: ECHOCARDIOGRAM COMPLETE Result Date: 09/17/2023    ECHOCARDIOGRAM REPORT   Patient Name:   Shelly Fields [facility] Date of Exam: 09/17/2023 Medical Rec #:  [id number]      Height:       62.0 in Accession #:    [id number]     Weight:       110.2 lb Date of Birth:  November 28, 1934     BSA:          1.484 m Patient Age:    88 years       BP:           149/73 mmHg Patient Gender: F              HR:           85 bpm. [facility] Procedure: 2D Echo, Cardiac Doppler and Color Doppler (Both Spectral and Color            Flow Doppler were utilized during procedure). Indications:    CHF-Acute Diastolic I50.31  History:  Patient has prior history of Echocardiogram examinations, most                 recent 02/25/2022. Arrythmias:Atrial Fibrillation,                 Signs/Symptoms:Syncope; Risk Factors:Hypertension.  Sonographer:    Celesta Gentile RCS Referring Phys: [phone] DAWOOD S ELGERGAWY IMPRESSIONS  1. Left ventricular ejection fraction, by estimation, is 70 to 75%. The left ventricle has hyperdynamic function. The left ventricle has no regional wall motion abnormalities. There is mild concentric left ventricular hypertrophy. Left ventricular diastolic parameters are indeterminate. There is the interventricular septum is flattened in systole and diastole, consistent with right ventricular pressure and volume overload.  2. Right ventricular systolic function is mildly reduced. The right ventricular size is severely enlarged. There is normal pulmonary artery systolic pressure. The estimated right ventricular systolic pressure is 30.7 mmHg.  3. Left atrial size was moderately dilated.  4. Right atrial size was severely dilated.  5. Left pleural effusion present. There is a small to moderate pericardial effusion. The pericardial effusion is posterior to the left ventricle.  6. The mitral valve is degenerative. Mild to moderate mitral valve regurgitation.  7. The tricuspid valve is degenerative. Tricuspid valve  regurgitation is moderate to severe.  8. The aortic valve is tricuspid. Aortic valve regurgitation is mild. No aortic stenosis is present.  9. The inferior vena cava is dilated in size with <50% respiratory variability, suggesting right atrial pressure of 15 mmHg. Comparison(s): Prior images reviewed side by side. LVEF vigorous at 70-75%. Severely dilated RV and RA with mild RV dysfunction and normal estimated RVSP. FINDINGS  Left Ventricle: Left ventricular ejection fraction, by estimation, is 70 to 75%. The left ventricle has hyperdynamic function. The left ventricle has no regional wall motion abnormalities. The left ventricular internal cavity size was normal in size. There is mild concentric left ventricular hypertrophy. The interventricular septum is flattened in systole and diastole, consistent with right ventricular pressure and volume overload. Left ventricular diastolic function could not be evaluated due to atrial fibrillation. Left ventricular diastolic parameters are indeterminate. Right Ventricle: The right ventricular size is severely enlarged. Right vetricular wall thickness was not well visualized. Right ventricular systolic function is mildly reduced. There is normal pulmonary artery systolic pressure. The tricuspid regurgitant velocity is 1.98 m/s, and with an assumed right atrial pressure of 15 mmHg, the estimated right ventricular systolic pressure is 30.7 mmHg. Left Atrium: Left atrial size was moderately dilated. Right Atrium: Right atrial size was severely dilated. Pericardium: Left pleural effusion present. A small pericardial effusion is present. The pericardial effusion is posterior to the left ventricle. There is no evidence of cardiac tamponade. Mitral Valve: The mitral valve is degenerative in appearance. There is mild thickening of the mitral valve leaflet(s). Mild to moderate mitral valve regurgitation, with posteriorly-directed jet. Tricuspid Valve: The tricuspid valve is  degenerative in appearance. Tricuspid valve regurgitation is moderate to severe. Aortic Valve: The aortic valve is tricuspid. There is mild aortic valve annular calcification. Aortic valve regurgitation is mild. No aortic stenosis is present. Aortic valve mean gradient measures 2.0 mmHg. Aortic valve peak gradient measures 4.0 mmHg. Aortic valve area, by VTI measures 2.19 cm. Pulmonic Valve: The pulmonic valve was grossly normal. Pulmonic valve regurgitation is trivial. Aorta: The aortic root is normal in size and structure. Venous: The inferior vena cava is dilated in size with less than 50% respiratory variability, suggesting right atrial pressure of 15 mmHg. IAS/Shunts: No  atrial level shunt detected by color flow Doppler. Additional Comments: 3D was performed not requiring image post processing on an independent workstation and was indeterminate.  LEFT VENTRICLE PLAX 2D LVIDd:         4.40 cm   Diastology LVIDs:         2.30 cm   LV e' medial:    8.38 cm/s LV PW:         1.10 cm   LV E/e' medial:  14.8 LV IVS:        1.00 cm   LV e' lateral:   11.30 cm/s LVOT diam:     1.90 cm   LV E/e' lateral: 11.0 LV SV:         43 LV SV Index:   29 LVOT Area:     2.84 cm  RIGHT VENTRICLE RV S prime:     11.90 cm/s TAPSE (M-mode): 1.8 cm LEFT ATRIUM              Index        RIGHT ATRIUM           Index LA diam:        3.60 cm  2.43 cm/m   RA Area:     51.20 cm LA Vol (A2C):   108.0 ml 72.77 ml/m  RA Volume:   241.00 ml 162.39 ml/m LA Vol (A4C):   65.5 ml  44.14 ml/m LA Biplane Vol: 84.7 ml  57.07 ml/m  AORTIC VALVE AV Area (Vmax):    1.98 cm AV Area (Vmean):   2.09 cm AV Area (VTI):     2.19 cm AV Vmax:           99.60 cm/s AV Vmean:          65.100 cm/s AV VTI:            0.198 m AV Peak Grad:      4.0 mmHg AV Mean Grad:      2.0 mmHg LVOT Vmax:         69.50 cm/s LVOT Vmean:        48.100 cm/s LVOT VTI:          0.153 m LVOT/AV VTI ratio: 0.77  AORTA Ao Root diam: 3.60 cm MITRAL VALVE                TRICUSPID  VALVE MV Area (PHT): 5.54 cm     TR Peak grad:   15.7 mmHg MV Decel Time: 137 msec     TR Vmax:        198.00 cm/s MR Peak grad: 102.4 mmHg MR Mean grad: 67.0 mmHg     SHUNTS MR Vmax:      506.00 cm/s   Systemic VTI:  0.15 m MR Vmean:     387.0 cm/s    Systemic Diam: 1.90 cm MV E velocity: 124.00 cm/s MV A velocity: 30.10 cm/s MV E/A ratio:  4.12 Nona Dell MD Electronically signed by Nona Dell MD Signature Date/Time: 09/17/2023/11:07:55 AM    Final    DG Chest 2 View Result Date: 09/16/2023 CLINICAL DATA:  Shortness of breath. EXAM: CHEST - 2 VIEW COMPARISON:  November 23, 2022. FINDINGS: Stable cardiomediastinal silhouette. Status post kyphoplasty of mid thoracic vertebral body. Mild right pleural effusion is noted with associated right basilar atelectasis or infiltrate. Left lung is unremarkable. IMPRESSION: Mild right pleural effusion is noted with associated right basilar atelectasis or infiltrate. Electronically Signed   By: Fayrene Fearing  Christen Butter M.D.   On: 09/16/2023 18:33    Scheduled Meds:  Chlorhexidine Gluconate Cloth  6 each Topical Daily   furosemide  40 mg Intravenous Daily   levothyroxine  75 mcg Oral QAC breakfast   metoprolol succinate  25 mg Oral Daily   pantoprazole (PROTONIX) IV  40 mg Intravenous Q6H   Continuous Infusions:  ampicillin-sulbactam (UNASYN) IV 3 g (09/17/23 1008)     LOS: 1 day   Time spent: 55 mins  Leanndra Pember Laural Benes, MD How to contact the [facility] Attending or Consulting provider 7A - 7P or covering provider during after hours 7P -7A, for this patient?  Check the care team in [facility] and look for a) attending/consulting TR[facility] provider listed and b) the [facility] team listed Log into [email or web address] to find provider on call.  Locate the [facility] provider you are looking for under [facility] and page to a number that you can be directly reached. If you still have difficulty reaching the provider, please page the [facility] (Director on Call) for the Hospitalists listed on amion  for assistance.  09/17/2023, 5:16 PM

## 2023-09-17 NOTE — Progress Notes (Signed)
*  PRELIMINARY RESULTS* Echocardiogram 2D Echocardiogram has been performed.  Stacey Drain 09/17/2023, 10:18 AM

## 2023-09-17 NOTE — Evaluation (Signed)
 Occupational Therapy Evaluation Patient Details Name: Shelly Fields MRN: [id number] DOB: 1934/08/18 Today's Date: 09/17/2023   History of Present Illness   Shelly Fields  is a 89 y.o. female,   with history of permanent Afib, pulmonology, HFpEF cor pulmonale, unable bronchiectasis, hypothyroidism and HTN.    -Presents to ED secondary to generalized weakness, fatigue and lethargy, denies any chest pain, fall, fever, chills, patient on Xarelto for A-fib, patient with known history of hemorrhoids, diverticulosis, gastritis with H. pylori during recent endoscopy and colonoscopy on October 2024, patient herself denies any coffee-ground emesis, melena or bright red blood per rectum, patient recently had to hold her Demadex for squamous cell resection surgery last Monday, she was told by her PCP to resume with increased doses yesterday as she is having significant edema and fluid retention. (per MD)     Clinical Impressions Pt agreeable to OT and PT co-evaluation. Pt is independent at baseline without use of AD. Today pt used RW for tranfers with CGA to min A. Posterior lean when walking backwards requiring min A. Bed mobility completed with min A. CGA for seated ADL tasks. B UE generally weak. Pt on 15 LPM supplemental O2 during the session. Pt left in the chair with family present and call bell within reach. Pt will benefit from continued OT in the hospital and recommended venue below to increase strength, balance, and endurance for safe ADL's.        If plan is discharge home, recommend the following:   A little help with walking and/or transfers;A little help with bathing/dressing/bathroom;Assistance with cooking/housework;Assist for transportation;Help with stairs or ramp for entrance     Functional Status Assessment   Patient has had a recent decline in their functional status and demonstrates the ability to make significant improvements in function in a reasonable and predictable amount of  time.     Equipment Recommendations   None recommended by OT             Precautions/Restrictions   Precautions Precautions: Fall Recall of Precautions/Restrictions: Intact Restrictions Weight Bearing Restrictions Per Provider Order: No     Mobility Bed Mobility Overal bed mobility: Needs Assistance Bed Mobility: Supine to Sit     Supine to sit: Min assist, HOB elevated     General bed mobility comments: Hand held assist to scoot to EOB    Transfers Overall transfer level: Needs assistance Equipment used: Rolling walker (2 wheels) Transfers: Sit to/from Stand, Bed to chair/wheelchair/BSC Sit to Stand: Contact guard assist     Step pivot transfers: Contact guard assist     General transfer comment: CGA for step pivot to chair from EOB; Min A needed during ambulation.      Balance Overall balance assessment: Needs assistance Sitting-balance support: No upper extremity supported, Feet supported Sitting balance-Leahy Scale: Fair Sitting balance - Comments: fair to good seated at EOB   Standing balance support: Bilateral upper extremity supported, During functional activity, Reliant on assistive device for balance Standing balance-Leahy Scale: Poor Standing balance comment: poor to fair with RW                           ADL either performed or assessed with clinical judgement   ADL Overall ADL's : Needs assistance/impaired     Grooming: Set up;Sitting   Upper Body Bathing: Set up;Sitting   Lower Body Bathing: Contact guard assist;Set up;Sitting/lateral leans   Upper Body Dressing : Set up;Sitting  Lower Body Dressing: Set up;Contact guard assist;Sitting/lateral leans Lower Body Dressing Details (indicate cue type and reason): Able to doff on don sock seated at EOB without physical assist. Toilet Transfer: Minimal assistance;Contact guard assist;Ambulation;Rolling walker (2 wheels);Stand-pivot Statistician Details (indicate cue type  and reason): Simulated via EOB to chair and ambulation in the room. Toileting- Clothing Manipulation and Hygiene: Contact guard assist;Sitting/lateral lean;Minimal assistance       Functional mobility during ADLs: Contact guard assist;Minimal assistance;Rolling walker (2 wheels) General ADL Comments: Able to ambualte ~15 feet total going forward and backward in the room.     Vision Baseline Vision/History: 1 Wears glasses Ability to See in Adequate Light: 0 Adequate Patient Visual Report: No change from baseline Vision Assessment?: No apparent visual deficits     Perception Perception: Not tested       Praxis Praxis: Not tested       Pertinent Vitals/Pain Pain Assessment Pain Assessment: Faces Faces Pain Scale: No hurt     Extremity/Trunk Assessment Upper Extremity Assessment Upper Extremity Assessment: Generalized weakness   Lower Extremity Assessment Lower Extremity Assessment: Defer to PT evaluation   Cervical / Trunk Assessment Cervical / Trunk Assessment: Normal   Communication Communication Communication: Impaired Factors Affecting Communication: Hearing impaired   Cognition Arousal: Alert Behavior During Therapy: WFL for tasks assessed/performed Cognition: No apparent impairments                               Following commands: Intact       Cueing  General Comments   Cueing Techniques: Verbal cues;Tactile cues                 Home Living Family/patient expects to be discharged to:: Private residence Living Arrangements: Alone;Other (Comment) (sons live on either side of her home [place] property[place] Available Help at Discharge: Family;Available 24 hours/day (Family reports that can assist 24/7 if needed.) Type of Home: House Home Access: Stairs to enter [employer] of Steps: 1 Entrance Stairs-Rails: None Home Layout: Other (Comment) (attick room which pt does not go up to.)     Bathroom Shower/Tub: Multimedia programmer: Standard Bathroom Accessibility: Yes How Accessible: Accessible via walker Home Equipment: Agricultural consultant (2 wheels);BSC/3in1          Prior Functioning/Environment Prior Level of Function : Independent/Modified Independent             Mobility Comments: 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 ADLs Comments: Independent prior to a week ago.    OT Problem List: Cardiopulmonary status limiting activity;Decreased strength;Decreased activity tolerance;Impaired balance (sitting and/or standing)   OT Treatment/Interventions: Self-care/ADL training;Therapeutic exercise;Therapeutic activities;Balance training;Patient/family education;DME and/or AE instruction      OT Goals(Current goals can be found in the care plan section)   Acute Rehab OT Goals Patient Stated Goal: return home OT Goal Formulation: With family Time For Goal Achievement: 10/01/23 Potential to Achieve Goals: Good   OT Frequency:  Min 1X/week                                   End of Session Equipment Utilized During Treatment: Rolling walker (2 wheels);Oxygen Nurse Communication: Other (comment) (notified pt was in the chair)  Activity Tolerance: Patient tolerated treatment well Patient left: in chair;with call bell/phone within reach;with family/visitor  present  OT Visit Diagnosis: Unsteadiness on feet (R26.81);Muscle weakness (generalized) (M62.81);Other abnormalities of gait and mobility (R26.89)                Time: 1610-9604 OT Time Calculation (min): 36 min Charges:  OT General Charges $OT Visit: 1 Visit OT Evaluation $OT Eval Low Complexity: 1 Low  Jilliane Kazanjian OT, MOT  Danie Chandler 09/17/2023, 1:57 PM

## 2023-09-17 NOTE — Hospital Course (Signed)
 89 y.o. female with history of permanent Afib, pulmonology, HFpEF cor pulmonale, unable bronchiectasis, hypothyroidism and HTN presented to ED secondary to generalized weakness, fatigue and lethargy, denies chest pain, fall, fever, chills, patient on Xarelto for A-fib, patient with known history of hemorrhoids, diverticulosis, gastritis with H. pylori during recent endoscopy and colonoscopy on October 2024, patient herself denies any coffee-ground emesis, melena or bright red blood per rectum, patient recently had to hold her Demadex for squamous cell resection surgery last Monday, she was told by her PCP to resume with increased doses yesterday as she is having significant edema and fluid retention. - In ED her hemoglobin came back low at 5.9, she was Hemoccult positive, so she received reversal agent Andexxa for Xarelto, ordered 3 units PRBC, she was dyspneic, hypoxic, and in a frail weakened state,  chest x-ray significant for pleural effusion, opacity and volume overload, started on IV Lasix and antibiotics, [facility] hospitalist consulted to admit.

## 2023-09-17 NOTE — Progress Notes (Signed)
   09/17/23 0938  TOC Brief Assessment  Insurance and Status Reviewed  Patient has primary care physician Yes  Home environment has been reviewed from home  Prior level of function: independent  Prior/Current Home Services No current home services  Social Drivers of Health Review SDOH reviewed no interventions necessary  Readmission risk has been reviewed Yes  Transition of care needs no transition of care needs at this time   Transition of Care Department [facility]) has reviewed patient and no TOC needs have been identified at this time. We will continue to monitor patient advancement through interdisciplinary progression rounds. If new patient transition needs arise, please place a TOC consult.

## 2023-09-18 ENCOUNTER — Inpatient Hospital Stay (HOSPITAL_COMMUNITY)

## 2023-09-18 DIAGNOSIS — K643 Fourth degree hemorrhoids: Principal | ICD-10-CM

## 2023-09-18 DIAGNOSIS — K2970 Gastritis, unspecified, without bleeding: Secondary | ICD-10-CM | POA: Diagnosis not present

## 2023-09-18 DIAGNOSIS — B9681 Helicobacter pylori [H. pylori] as the cause of diseases classified elsewhere: Secondary | ICD-10-CM

## 2023-09-18 DIAGNOSIS — D62 Acute posthemorrhagic anemia: Secondary | ICD-10-CM | POA: Diagnosis not present

## 2023-09-18 DIAGNOSIS — K2990 Gastroduodenitis, unspecified, without bleeding: Secondary | ICD-10-CM | POA: Diagnosis not present

## 2023-09-18 LAB — FERRITIN: Ferritin: 63 ng/mL (ref 11–307)

## 2023-09-18 LAB — TYPE AND SCREEN
ABO/RH(D): A POS
Antibody Screen: NEGATIVE
Unit division: 0
Unit division: 0
Unit division: 0

## 2023-09-18 LAB — CBC
HCT: 29.3 % — ABNORMAL LOW (ref 36.0–46.0)
Hemoglobin: 9.4 g/dL — ABNORMAL LOW (ref 12.0–15.0)
MCH: 28.3 pg (ref 26.0–34.0)
MCHC: 32.1 g/dL (ref 30.0–36.0)
MCV: 88.3 fL (ref 80.0–100.0)
Platelets: 240 10*3/uL (ref 150–400)
RBC: 3.32 MIL/uL — ABNORMAL LOW (ref 3.87–5.11)
RDW: 16.1 % — ABNORMAL HIGH (ref 11.5–15.5)
WBC: 9.9 10*3/uL (ref 4.0–10.5)
nRBC: 4.3 % — ABNORMAL HIGH (ref 0.0–0.2)

## 2023-09-18 LAB — BPAM RBC
Blood Product Expiration Date: 202504132359
Blood Product Expiration Date: 202504132359
Blood Product Expiration Date: 202504192359
ISSUE DATE / TIME: 202504101939
ISSUE DATE / TIME: 202504102353
ISSUE DATE / TIME: 202504110105
Unit Type and Rh: 6200
Unit Type and Rh: 6200
Unit Type and Rh: 6200

## 2023-09-18 LAB — BASIC METABOLIC PANEL WITH GFR
Anion gap: 12 (ref 5–15)
BUN: 28 mg/dL — ABNORMAL HIGH (ref 8–23)
CO2: 24 mmol/L (ref 22–32)
Calcium: 7.4 mg/dL — ABNORMAL LOW (ref 8.9–10.3)
Chloride: 96 mmol/L — ABNORMAL LOW (ref 98–111)
Creatinine, Ser: 0.94 mg/dL (ref 0.44–1.00)
GFR, Estimated: 59 mL/min — ABNORMAL LOW (ref >60)
Glucose, Bld: 89 mg/dL (ref 70–99)
Potassium: 2.8 mmol/L — ABNORMAL LOW (ref 3.5–5.1)
Sodium: 132 mmol/L — ABNORMAL LOW (ref 135–145)

## 2023-09-18 LAB — MAGNESIUM: Magnesium: 2.6 mg/dL — ABNORMAL HIGH (ref 1.7–2.4)

## 2023-09-18 LAB — IRON AND TIBC
Iron: 24 ug/dL — ABNORMAL LOW (ref 28–170)
Saturation Ratios: 6 % — ABNORMAL LOW (ref 10.4–31.8)
TIBC: 423 ug/dL (ref 250–450)
UIBC: 399 ug/dL

## 2023-09-18 LAB — LACTATE DEHYDROGENASE: LDH: 147 U/L (ref 98–192)

## 2023-09-18 MED ORDER — POTASSIUM CHLORIDE CRYS ER 20 MEQ PO TBCR
40.0000 meq | EXTENDED_RELEASE_TABLET | Freq: Two times a day (BID) | ORAL | Status: DC
  Administered 2023-09-18 – 2023-09-19 (×4): 40 meq via ORAL
  Filled 2023-09-18 (×4): qty 2

## 2023-09-18 MED ORDER — METOPROLOL SUCCINATE ER 25 MG PO TB24
25.0000 mg | ORAL_TABLET | Freq: Two times a day (BID) | ORAL | Status: DC
  Administered 2023-09-18: 25 mg via ORAL
  Filled 2023-09-18: qty 1

## 2023-09-18 NOTE — Plan of Care (Signed)

## 2023-09-18 NOTE — Progress Notes (Addendum)
 PROGRESS NOTE   Shelly Fields  ZOX:[id number] DOB: 13-Aug-1934 DOA: 09/16/2023 PCP: Orlena Bitters, MD   Chief Complaint  Patient presents with   Shortness of Breath    Per EMS O2 sats in the 60s on RA upon arrival. Pt lethargic.   Level of care: Stepdown  Brief Admission History:  89 y.o. female with history of permanent Afib, pulmonology, HFpEF cor pulmonale, unable bronchiectasis, hypothyroidism and HTN presented to ED secondary to generalized weakness, fatigue and lethargy, denies chest pain, fall, fever, chills, patient on Xarelto for A-fib, patient with known history of hemorrhoids, diverticulosis, gastritis with H. pylori during recent endoscopy and colonoscopy on October 2024, patient herself denies any coffee-ground emesis, melena or bright red blood per rectum, patient recently had to hold her Demadex for squamous cell resection surgery last Monday, she was told by her PCP to resume with increased doses yesterday as she is having significant edema and fluid retention. - In ED her hemoglobin came back low at 5.9, she was Hemoccult positive, so she received reversal agent Andexxa for Xarelto, ordered 3 units PRBC, she was dyspneic, hypoxic, and in a frail weakened state,  chest x-ray significant for pleural effusion, opacity and volume overload, started on IV Lasix and antibiotics, [facility] hospitalist consulted to admit.   Assessment and Plan:  Acute blood loss anemia Symptomatic anemia GI bleed -Patient presents with hemoglobin of 5.9, symptomatic, she is ordered 3 units PRBC transfusion. -Xarelto on hold, she received Andexxa for reversal -Endoscopy, colonoscopy last October, significant for gastritis, H. pylori, diverticulosis with hemorrhoids - s/p transfused 3 units PRBC, monitor CBC closely. - advance diet as tolerated  -GI consult appreciated, for now follow Hg, recently had procedures, may need outpatient hematology consult  -Continue with IV Protonix -would not restart  anticoagulation given high risk for recurrent severe bleeding with limit treatment options   Acute respiratory failure with hypoxia Duration of pneumonia/right lower lobe pneumonia Acute on chronic diastolic CHF Right heart failure/cor pulmonale - Patient off nonrebreather with much improvement - the underlying problem was urinary retention she had more than 500 cc in bladder - continue foley for now, add flomax with supper - Chest x-ray significant for volume overload, as well having elevated BNP, he will be started on IV diuresis, repeat 2D echo.  Hold Farxiga for now - Right lung base opacity, she had an episode of vomiting Tuesday morning, will start on IV Unasyn - Blood pressures stabilizing with diuresis - As discussed with son, patient is DNR, no heroics, but okay to continue above measures like pressors via peripheral and BiPAP.   Acute urinary retention  - foley cath placed with good urine output - foley removed 4/12, awaiting void  - added flomax on 4/11  Essential hypertension.  - Continue with home metoprolol   Atrial fibrillation -Hold Xarelto due to above -Continue with Toprol-XL   Hypothyroidism -Continue with Synthroid   Hyponatremia - Due to volume overload, should improve with diuresis  Hypokalemia - oral potassium replacement    AKI - Stable cardiorenal, monitor closely on IV diuresis   Squamous cell carcinoma status post recent resection on Monday -Wound looks stable  DVT prophylaxis: SCDs Code Status: DNR  Family Communication: son at bedside updated 4/12  Disposition: anticipate return home with [facility]    Consultants:  GI Cardio  Procedures:  None planned  Antimicrobials:    Subjective: Pt says she is really starting to feel better, foley cath was removed this morning but she  has not voided yet; no SOB, no CP.     Objective: Vitals:   09/18/23 0821 09/18/23 0900 09/18/23 1000 09/18/23 1146  BP:  (!) 126/53 (!) 133/58   Pulse: (!) 108 93  89   Resp: 16 (!) 25 (!) 23   Temp:    98.3 F (36.8 C)  TempSrc:    Oral  SpO2: 100% 98% 100%   Weight:      Height:        Intake/Output Summary (Last 24 hours) at 09/18/2023 1415 Last data filed at 09/18/2023 0630 Gross per 24 hour  Intake 200 ml  Output 750 ml  Net -550 ml   Filed Weights   09/16/23 1617 09/16/23 2142  Weight: 47 kg 50 kg   Examination:  General exam: Appears calm and comfortable  Respiratory system: diminished BS at bases.  Cardiovascular system: normal S1 & S2 heard. No JVD, murmurs, rubs, gallops or clicks. No pedal edema. Gastrointestinal system: Abdomen is nondistended, soft and nontender. No organomegaly or masses felt. Normal bowel sounds heard. Central nervous system: Alert and oriented. No focal neurological deficits. Extremities: Symmetric 5 x 5 power. Skin: No rashes, lesions or ulcers. Psychiatry: Judgement and insight appear normal. Mood & affect appropriate.   Data Reviewed: I have personally reviewed following labs and imaging studies  CBC: Recent Labs  Lab 09/16/23 1645 09/17/23 0454 09/18/23 0438  WBC 8.4 11.0* 9.9  NEUTROABS 6.7  --   --   HGB 5.9* 9.7* 9.4*  HCT 20.8* 31.2* 29.3*  MCV 94.1 87.6 88.3  PLT 386 275 240    Basic Metabolic Panel: Recent Labs  Lab 09/16/23 1645 09/17/23 0454 09/18/23 0438 09/18/23 0820  NA 124* 127* 132*  --   K 4.3 4.0 2.8*  --   CL 89* 94* 96*  --   CO2 22 23 24   --   GLUCOSE 137* 112* 89  --   BUN 41* 36* 28*  --   CREATININE 1.21* 1.00 0.94  --   CALCIUM 8.1* 7.6* 7.4*  --   MG  --   --   --  2.6*    CBG: No results for input(s): "GLUCAP" in the last 168 hours.  Recent Results (from the past 240 hours)  MRSA Next Gen by PCR, Nasal     Status: None   Collection Time: 09/16/23  7:30 PM   Specimen: Nasal Mucosa; Nasal Swab  Result Value Ref Range Status   MRSA by PCR Next Gen NOT DETECTED NOT DETECTED Final    Comment: (NOTE) The GeneXpert MRSA Assay (FDA approved for NASAL  specimens only), is one component of a comprehensive MRSA colonization surveillance program. It is not intended to diagnose MRSA infection nor to guide or monitor treatment for MRSA infections. Test performance is not FDA approved in patients less than 40 years old. Performed at [facility], [address], [place], [place] [address]      Radiology Studies: DG CHEST PORT 1 VIEW Result Date: 09/18/2023 CLINICAL DATA:  [id number] Aspiration pneumonia (HCC) [id number] EXAM: PORTABLE CHEST - 1 VIEW COMPARISON:  09/16/2023 FINDINGS: Pleural effusions right greater than left with opacities in both lung bases, stable. Heart size and mediastinal contours are within normal limits. Aortic Atherosclerosis (ICD10-170.0). Post lower thoracic cement augmentation. IMPRESSION: Bilateral pleural effusions and basilar opacities. Electronically Signed   By: Nicoletta Barrier M.D.   On: 09/18/2023 08:53   ECHOCARDIOGRAM COMPLETE Result Date: 09/17/2023    ECHOCARDIOGRAM REPORT  Patient Name:   Shelly Fields [facility] Date of Exam: 09/17/2023 Medical Rec #:  [id number]      Height:       62.0 in Accession #:    [id number]     Weight:       110.2 lb Date of Birth:  [birth year]     BSA:          1.484 m Patient Age:    88 years       BP:           149/73 mmHg Patient Gender: F              HR:           85 bpm. Exam Location:  [facility] Procedure: 2D Echo, Cardiac Doppler and Color Doppler (Both Spectral and Color            Flow Doppler were utilized during procedure). Indications:    CHF-Acute Diastolic I50.31  History:        Patient has prior history of Echocardiogram examinations, most                 recent 02/25/2022. Arrythmias:Atrial Fibrillation,                 Signs/Symptoms:Syncope; Risk Factors:Hypertension.  Sonographer:    Denese Finn RCS Referring Phys: [phone] DAWOOD S ELGERGAWY IMPRESSIONS  1. Left ventricular ejection fraction, by estimation, is 70 to 75%. The left ventricle has hyperdynamic function. The left ventricle has no  regional wall motion abnormalities. There is mild concentric left ventricular hypertrophy. Left ventricular diastolic parameters are indeterminate. There is the interventricular septum is flattened in systole and diastole, consistent with right ventricular pressure and volume overload.  2. Right ventricular systolic function is mildly reduced. The right ventricular size is severely enlarged. There is normal pulmonary artery systolic pressure. The estimated right ventricular systolic pressure is 30.7 mmHg.  3. Left atrial size was moderately dilated.  4. Right atrial size was severely dilated.  5. Left pleural effusion present. There is a small to moderate pericardial effusion. The pericardial effusion is posterior to the left ventricle.  6. The mitral valve is degenerative. Mild to moderate mitral valve regurgitation.  7. The tricuspid valve is degenerative. Tricuspid valve regurgitation is moderate to severe.  8. The aortic valve is tricuspid. Aortic valve regurgitation is mild. No aortic stenosis is present.  9. The inferior vena cava is dilated in size with <50% respiratory variability, suggesting right atrial pressure of 15 mmHg. Comparison(s): Prior images reviewed side by side. LVEF vigorous at 70-75%. Severely dilated RV and RA with mild RV dysfunction and normal estimated RVSP. FINDINGS  Left Ventricle: Left ventricular ejection fraction, by estimation, is 70 to 75%. The left ventricle has hyperdynamic function. The left ventricle has no regional wall motion abnormalities. The left ventricular internal cavity size was normal in size. There is mild concentric left ventricular hypertrophy. The interventricular septum is flattened in systole and diastole, consistent with right ventricular pressure and volume overload. Left ventricular diastolic function could not be evaluated due to atrial fibrillation. Left ventricular diastolic parameters are indeterminate. Right Ventricle: The right ventricular size is  severely enlarged. Right vetricular wall thickness was not well visualized. Right ventricular systolic function is mildly reduced. There is normal pulmonary artery systolic pressure. The tricuspid regurgitant velocity is 1.98 m/s, and with an assumed right atrial pressure of 15 mmHg, the estimated right ventricular systolic pressure is 30.7 mmHg. Left Atrium: Left atrial  size was moderately dilated. Right Atrium: Right atrial size was severely dilated. Pericardium: Left pleural effusion present. A small pericardial effusion is present. The pericardial effusion is posterior to the left ventricle. There is no evidence of cardiac tamponade. Mitral Valve: The mitral valve is degenerative in appearance. There is mild thickening of the mitral valve leaflet(s). Mild to moderate mitral valve regurgitation, with posteriorly-directed jet. Tricuspid Valve: The tricuspid valve is degenerative in appearance. Tricuspid valve regurgitation is moderate to severe. Aortic Valve: The aortic valve is tricuspid. There is mild aortic valve annular calcification. Aortic valve regurgitation is mild. No aortic stenosis is present. Aortic valve mean gradient measures 2.0 mmHg. Aortic valve peak gradient measures 4.0 mmHg. Aortic valve area, by VTI measures 2.19 cm. Pulmonic Valve: The pulmonic valve was grossly normal. Pulmonic valve regurgitation is trivial. Aorta: The aortic root is normal in size and structure. Venous: The inferior vena cava is dilated in size with less than 50% respiratory variability, suggesting right atrial pressure of 15 mmHg. IAS/Shunts: No atrial level shunt detected by color flow Doppler. Additional Comments: 3D was performed not requiring image post processing on an independent workstation and was indeterminate.  LEFT VENTRICLE PLAX 2D LVIDd:         4.40 cm   Diastology LVIDs:         2.30 cm   LV e' medial:    8.38 cm/s LV PW:         1.10 cm   LV E/e' medial:  14.8 LV IVS:        1.00 cm   LV e' lateral:    11.30 cm/s LVOT diam:     1.90 cm   LV E/e' lateral: 11.0 LV SV:         43 LV SV Index:   29 LVOT Area:     2.84 cm  RIGHT VENTRICLE RV S prime:     11.90 cm/s TAPSE (M-mode): 1.8 cm LEFT ATRIUM              Index        RIGHT ATRIUM           Index LA diam:        3.60 cm  2.43 cm/m   RA Area:     51.20 cm LA Vol (A2C):   108.0 ml 72.77 ml/m  RA Volume:   241.00 ml 162.39 ml/m LA Vol (A4C):   65.5 ml  44.14 ml/m LA Biplane Vol: 84.7 ml  57.07 ml/m  AORTIC VALVE AV Area (Vmax):    1.98 cm AV Area (Vmean):   2.09 cm AV Area (VTI):     2.19 cm AV Vmax:           99.60 cm/s AV Vmean:          65.100 cm/s AV VTI:            0.198 m AV Peak Grad:      4.0 mmHg AV Mean Grad:      2.0 mmHg LVOT Vmax:         69.50 cm/s LVOT Vmean:        48.100 cm/s LVOT VTI:          0.153 m LVOT/AV VTI ratio: 0.77  AORTA Ao Root diam: 3.60 cm MITRAL VALVE                TRICUSPID VALVE MV Area (PHT): 5.54 cm     TR Peak grad:  15.7 mmHg MV Decel Time: 137 msec     TR Vmax:        198.00 cm/s MR Peak grad: 102.4 mmHg MR Mean grad: 67.0 mmHg     SHUNTS MR Vmax:      506.00 cm/s   Systemic VTI:  0.15 m MR Vmean:     387.0 cm/s    Systemic Diam: 1.90 cm MV E velocity: 124.00 cm/s MV A velocity: 30.10 cm/s MV E/A ratio:  4.12 Teddie Favre MD Electronically signed by Teddie Favre MD Signature Date/Time: 09/17/2023/11:07:55 AM    Final    DG Chest 2 View Result Date: 09/16/2023 CLINICAL DATA:  Shortness of breath. EXAM: CHEST - 2 VIEW COMPARISON:  November 23, 2022. FINDINGS: Stable cardiomediastinal silhouette. Status post kyphoplasty of mid thoracic vertebral body. Mild right pleural effusion is noted with associated right basilar atelectasis or infiltrate. Left lung is unremarkable. IMPRESSION: Mild right pleural effusion is noted with associated right basilar atelectasis or infiltrate. Electronically Signed   By: Rosalene Colon M.D.   On: 09/16/2023 18:33   Scheduled Meds:  Chlorhexidine Gluconate Cloth  6 each Topical  Daily   furosemide  40 mg Intravenous Daily   levothyroxine  75 mcg Oral QAC breakfast   metoprolol succinate  25 mg Oral Daily   pantoprazole (PROTONIX) IV  40 mg Intravenous Daily   potassium chloride  40 mEq Oral BID   tamsulosin  0.4 mg Oral QPC supper   Continuous Infusions:  ampicillin-sulbactam (UNASYN) IV 3 g (09/18/23 0816)    LOS: 2 days   Time spent: 50 mins  Netanya Yazdani Lincoln Renshaw, MD How to contact the [facility] Attending or Consulting provider 7A - 7P or covering provider during after hours 7P -7A, for this patient?  Check the care team in [facility] and look for a) attending/consulting [facility] provider listed and b) the [facility] team listed Log into [email or web address] to find provider on call.  Locate the [facility] provider you are looking for under [facility] and page to a number that you can be directly reached. If you still have difficulty reaching the provider, please page the [facility] (Director on Call) for the Hospitalists listed on amion for assistance.  09/18/2023, 2:15 PM

## 2023-09-18 NOTE — Plan of Care (Signed)

## 2023-09-19 DIAGNOSIS — E039 Hypothyroidism, unspecified: Secondary | ICD-10-CM | POA: Diagnosis not present

## 2023-09-19 DIAGNOSIS — K2970 Gastritis, unspecified, without bleeding: Secondary | ICD-10-CM | POA: Diagnosis not present

## 2023-09-19 DIAGNOSIS — D62 Acute posthemorrhagic anemia: Secondary | ICD-10-CM | POA: Diagnosis not present

## 2023-09-19 DIAGNOSIS — D500 Iron deficiency anemia secondary to blood loss (chronic): Secondary | ICD-10-CM | POA: Diagnosis not present

## 2023-09-19 DIAGNOSIS — K643 Fourth degree hemorrhoids: Secondary | ICD-10-CM | POA: Diagnosis not present

## 2023-09-19 DIAGNOSIS — Z8619 Personal history of other infectious and parasitic diseases: Secondary | ICD-10-CM | POA: Diagnosis not present

## 2023-09-19 DIAGNOSIS — I272 Pulmonary hypertension, unspecified: Secondary | ICD-10-CM | POA: Diagnosis not present

## 2023-09-19 DIAGNOSIS — Z8719 Personal history of other diseases of the digestive system: Secondary | ICD-10-CM | POA: Diagnosis not present

## 2023-09-19 LAB — BASIC METABOLIC PANEL WITH GFR
Anion gap: 7 (ref 5–15)
BUN: 18 mg/dL (ref 8–23)
CO2: 24 mmol/L (ref 22–32)
Calcium: 7.4 mg/dL — ABNORMAL LOW (ref 8.9–10.3)
Chloride: 98 mmol/L (ref 98–111)
Creatinine, Ser: 0.69 mg/dL (ref 0.44–1.00)
GFR, Estimated: >60 mL/min (ref >60)
Glucose, Bld: 96 mg/dL (ref 70–99)
Potassium: 4 mmol/L (ref 3.5–5.1)
Sodium: 129 mmol/L — ABNORMAL LOW (ref 135–145)

## 2023-09-19 LAB — CBC
HCT: 30 % — ABNORMAL LOW (ref 36.0–46.0)
Hemoglobin: 9 g/dL — ABNORMAL LOW (ref 12.0–15.0)
MCH: 27.2 pg (ref 26.0–34.0)
MCHC: 30 g/dL (ref 30.0–36.0)
MCV: 90.6 fL (ref 80.0–100.0)
Platelets: 229 10*3/uL (ref 150–400)
RBC: 3.31 MIL/uL — ABNORMAL LOW (ref 3.87–5.11)
RDW: 16.3 % — ABNORMAL HIGH (ref 11.5–15.5)
WBC: 8.6 10*3/uL (ref 4.0–10.5)
nRBC: 2.6 % — ABNORMAL HIGH (ref 0.0–0.2)

## 2023-09-19 LAB — HAPTOGLOBIN: Haptoglobin: 171 mg/dL (ref 41–333)

## 2023-09-19 LAB — MAGNESIUM: Magnesium: 2.3 mg/dL (ref 1.7–2.4)

## 2023-09-19 LAB — TSH: TSH: 11.008 u[IU]/mL — ABNORMAL HIGH (ref 0.350–4.500)

## 2023-09-19 LAB — T4, FREE: Free T4: 1.61 ng/dL — ABNORMAL HIGH (ref 0.61–1.12)

## 2023-09-19 MED ORDER — METOPROLOL SUCCINATE ER 50 MG PO TB24
75.0000 mg | ORAL_TABLET | Freq: Two times a day (BID) | ORAL | Status: DC
  Administered 2023-09-19 – 2023-09-20 (×2): 75 mg via ORAL
  Filled 2023-09-19 (×2): qty 1

## 2023-09-19 MED ORDER — METOPROLOL SUCCINATE ER 50 MG PO TB24
50.0000 mg | ORAL_TABLET | Freq: Two times a day (BID) | ORAL | Status: DC
  Administered 2023-09-19: 50 mg via ORAL
  Filled 2023-09-19: qty 1

## 2023-09-19 MED ORDER — PSYLLIUM 95 % PO PACK
1.0000 | PACK | Freq: Every day | ORAL | Status: DC
  Administered 2023-09-19 – 2023-09-21 (×3): 1 via ORAL
  Filled 2023-09-19 (×5): qty 1

## 2023-09-19 MED ORDER — PANTOPRAZOLE SODIUM 40 MG PO TBEC
40.0000 mg | DELAYED_RELEASE_TABLET | Freq: Every day | ORAL | Status: DC
  Administered 2023-09-20 – 2023-09-22 (×3): 40 mg via ORAL
  Filled 2023-09-19 (×3): qty 1

## 2023-09-19 NOTE — Progress Notes (Signed)
 PROGRESS NOTE   Shelly Fields  ZOX:[id number] DOB: 02-Mar-1935 DOA: 09/16/2023 PCP: Orlena Bitters, MD   Chief Complaint  Patient presents with   Shortness of Breath    Per EMS O2 sats in the 60s on RA upon arrival. Pt lethargic.   Level of care: Stepdown  Brief Admission History:  89 y.o. female with history of permanent Afib, pulmonology, HFpEF cor pulmonale, unable bronchiectasis, hypothyroidism and HTN presented to ED secondary to generalized weakness, fatigue and lethargy, denies chest pain, fall, fever, chills, patient on Xarelto for A-fib, patient with known history of hemorrhoids, diverticulosis, gastritis with H. pylori during recent endoscopy and colonoscopy on October 2024, patient herself denies any coffee-ground emesis, melena or bright red blood per rectum, patient recently had to hold her Demadex for squamous cell resection surgery last Monday, she was told by her PCP to resume with increased doses yesterday as she is having significant edema and fluid retention. - In ED her hemoglobin came back low at 5.9, she was Hemoccult positive, so she received reversal agent Andexxa for Xarelto, ordered 3 units PRBC, she was dyspneic, hypoxic, and in a frail weakened state,  chest x-ray significant for pleural effusion, opacity and volume overload, started on IV Lasix and antibiotics, [facility] hospitalist consulted to admit.   Assessment and Plan:  Acute blood loss anemia Symptomatic anemia GI bleed -Patient presents with hemoglobin of 5.9, symptomatic, she is ordered 3 units PRBC transfusion. -Xarelto on hold, she received Andexxa for reversal -Endoscopy, colonoscopy last October, significant for gastritis, H. pylori, diverticulosis with hemorrhoids - s/p transfused 3 units PRBC, monitor CBC closely. - advance diet as tolerated  -GI consult appreciated, for now follow Hg, recently had procedures, may need outpatient hematology consult  -Continue with Protonix for GI protection -would  not restart anticoagulation given high risk for recurrent severe bleeding with limit treatment options, discussed with patient and son and they verbalized understanding    Acute respiratory failure with hypoxia Duration of pneumonia/right lower lobe pneumonia Acute on chronic diastolic CHF Right heart failure/cor pulmonale - Patient off nonrebreather with much improvement - the underlying problem was urinary retention she had more than 500 cc in bladder - continue foley for now, add flomax with supper - Chest x-ray significant for volume overload, as well having elevated BNP, she was started on IV diuresis, repeat 2D echo.  Hold Farxiga for now - Right lung base opacity, she had an episode of vomiting Tuesday morning, will start on IV Unasyn - Blood pressures stabilizing with diuresis - As discussed with son, patient is DNR, no heroics, but okay to continue above measures like pressors via peripheral and BiPAP.   Acute urinary retention  - foley cath placed with good urine output - foley removed 4/12, she has been voiding and we are monitoring closely  - added flomax on 4/11  Essential hypertension.  - Continue with home metoprolol   Atrial fibrillation with RVR  - HR up to 130-140s - further review of home med rec and she has tolerated doses of metoprolol XL up to 75 mg BID - Hold Xarelto due to above, would not restart due to severe bleeding complication - increased Toprol-XL to 75 mg BID with hold parameters - - keep in stepdown ICU in case IV infusion is needed for better HR control -- I think a lot of this is rebound tachycardia from lower doses of toprol ordered on admission, now that her home meds have been reconciled I have been  able to restart her back on her home toprol XL doses - add on TSH for completeness   Hypothyroidism -Continue with Synthroid   Hyponatremia - Due to volume overload, should improve with diuresis  Hypokalemia - oral potassium replacement    AKI -  Stable cardiorenal, monitor closely on IV diuresis   Squamous cell carcinoma status post recent resection on Monday -Wound looks stable  DVT prophylaxis: SCDs Code Status: DNR  Family Communication: son at bedside updated 4/12  Disposition: anticipate return home with [facility]    Consultants:  GI Cardio  Procedures:  None planned  Antimicrobials:    Subjective: Pt is in afib with RVR this morning but she is asymptomatic, she says she feels much better and asking about going home today.       Objective: Vitals:   09/19/23 0900 09/19/23 1000 09/19/23 1100 09/19/23 1200  BP: (!) 140/53 (!) 155/75 135/80 126/89  Pulse: (!) 113  86 (!) 140  Resp: (!) 23 (!) 23 (!) 22 (!) 22  Temp:    97.9 F (36.6 C)  TempSrc:    Oral  SpO2: 97% 98% 99% 96%  Weight:      Height:        Intake/Output Summary (Last 24 hours) at 09/19/2023 1433 Last data filed at 09/19/2023 0700 Gross per 24 hour  Intake --  Output 450 ml  Net -450 ml   Filed Weights   09/16/23 1617 09/16/23 2142  Weight: 47 kg 50 kg   Examination:  General exam: Appears calm and comfortable  Respiratory system: diminished BS at bases but mostly clear   Cardiovascular system: irregularly irregular and tachycardic; normal S1 & S2 heard. No JVD, murmurs, rubs, gallops or clicks. No pedal edema. Gastrointestinal system: Abdomen is nondistended, soft and nontender. No organomegaly or masses felt. Normal bowel sounds heard. Central nervous system: Alert and oriented. No focal neurological deficits. Extremities: Symmetric 5 x 5 power. Skin: No rashes, lesions or ulcers. Psychiatry: Judgement and insight appear normal. Mood & affect appropriate.   Data Reviewed: I have personally reviewed following labs and imaging studies  CBC: Recent Labs  Lab 09/16/23 1645 09/17/23 0454 09/18/23 0438 09/19/23 0327  WBC 8.4 11.0* 9.9 8.6  NEUTROABS 6.7  --   --   --   HGB 5.9* 9.7* 9.4* 9.0*  HCT 20.8* 31.2* 29.3* 30.0*  MCV 94.1  87.6 88.3 90.6  PLT 386 275 240 229    Basic Metabolic Panel: Recent Labs  Lab 09/16/23 1645 09/17/23 0454 09/18/23 0438 09/18/23 0820 09/19/23 0327  NA 124* 127* 132*  --  129*  K 4.3 4.0 2.8*  --  4.0  CL 89* 94* 96*  --  98  CO2 22 23 24   --  24  GLUCOSE 137* 112* 89  --  96  BUN 41* 36* 28*  --  18  CREATININE 1.21* 1.00 0.94  --  0.69  CALCIUM 8.1* 7.6* 7.4*  --  7.4*  MG  --   --   --  2.6* 2.3    CBG: No results for input(s): "GLUCAP" in the last 168 hours.  Recent Results (from the past 240 hours)  MRSA Next Gen by PCR, Nasal     Status: None   Collection Time: 09/16/23  7:30 PM   Specimen: Nasal Mucosa; Nasal Swab  Result Value Ref Range Status   MRSA by PCR Next Gen NOT DETECTED NOT DETECTED Final    Comment: (NOTE) The GeneXpert MRSA  Assay (FDA approved for NASAL specimens only), is one component of a comprehensive MRSA colonization surveillance program. It is not intended to diagnose MRSA infection nor to guide or monitor treatment for MRSA infections. Test performance is not FDA approved in patients less than 6 years old. Performed at [facility], [address], [place], [place] [address]      Radiology Studies: DG CHEST PORT 1 VIEW Result Date: 09/18/2023 CLINICAL DATA:  [id number] Aspiration pneumonia (HCC) [id number] EXAM: PORTABLE CHEST - 1 VIEW COMPARISON:  09/16/2023 FINDINGS: Pleural effusions right greater than left with opacities in both lung bases, stable. Heart size and mediastinal contours are within normal limits. Aortic Atherosclerosis (ICD10-170.0). Post lower thoracic cement augmentation. IMPRESSION: Bilateral pleural effusions and basilar opacities. Electronically Signed   By: Nicoletta Barrier M.D.   On: 09/18/2023 08:53   Scheduled Meds:  Chlorhexidine Gluconate Cloth  6 each Topical Daily   furosemide  40 mg Intravenous Daily   levothyroxine  75 mcg Oral QAC breakfast   metoprolol succinate  75 mg Oral BID   pantoprazole (PROTONIX) IV  40 mg  Intravenous Daily   potassium chloride  40 mEq Oral BID   psyllium  1 packet Oral QHS   tamsulosin  0.4 mg Oral QPC supper   Continuous Infusions:  ampicillin-sulbactam (UNASYN) IV 3 g (09/19/23 0842)    LOS: 3 days   Time spent: 55 mins  Cathy Crounse Lincoln Renshaw, MD How to contact the [facility] Attending or Consulting provider 7A - 7P or covering provider during after hours 7P -7A, for this patient?  Check the care team in [facility] and look for a) attending/consulting [facility] provider listed and b) the [facility] team listed Log into [email or web address] to find provider on call.  Locate the [facility] provider you are looking for under [facility] and page to a number that you can be directly reached. If you still have difficulty reaching the provider, please page the [facility] (Director on Call) for the Hospitalists listed on amion for assistance.  09/19/2023, 2:33 PM

## 2023-09-19 NOTE — Progress Notes (Signed)
 Gastroenterology & Hepatology   Interval History: Patient is seen in ICU this afternoon with granddaughter bedside.  Patient denies any blood per rectum  Inpatient Medications:  Current Facility-Administered Medications:    acetaminophen (TYLENOL) tablet 650 mg, 650 mg, Oral, Q6H PRN, 650 mg at 09/19/23 0843 **OR** acetaminophen (TYLENOL) suppository 650 mg, 650 mg, Rectal, Q6H PRN, Elgergawy, Dawood S, MD   ALPRAZolam (XANAX) tablet 0.125 mg, 0.125 mg, Oral, QHS PRN, Elgergawy, Dawood S, MD, 0.125 mg at 09/18/23 2226   Ampicillin-Sulbactam (UNASYN) 3 g in sodium chloride 0.9 % 100 mL IVPB, 3 g, Intravenous, Q8H, Johnson, Clanford L, MD, Last Rate: 200 mL/hr at 09/19/23 1657, 3 g at 09/19/23 1657   Chlorhexidine Gluconate Cloth 2 % PADS 6 each, 6 each, Topical, Daily, Elgergawy, Ardia Kraft, MD, 6 each at 09/19/23 0843   furosemide (LASIX) injection 40 mg, 40 mg, Intravenous, Daily, Johnson, Clanford L, MD, 40 mg at 09/19/23 0842   levalbuterol (XOPENEX) nebulizer solution 0.63 mg, 0.63 mg, Nebulization, Q6H PRN, Elgergawy, Dawood S, MD   levothyroxine (SYNTHROID) tablet 75 mcg, 75 mcg, Oral, QAC breakfast, Elgergawy, Dawood S, MD, 75 mcg at 09/19/23 0631   metoprolol succinate (TOPROL-XL) 24 hr tablet 75 mg, 75 mg, Oral, BID, Johnson, Clanford L, MD   Muscle Rub CREA, , Topical, PRN, Lavanda Porter, MD, 1 Application at 09/19/23 0842   ondansetron (ZOFRAN) tablet 4 mg, 4 mg, Oral, Q6H PRN **OR** ondansetron (ZOFRAN) injection 4 mg, 4 mg, Intravenous, Q6H PRN, Elgergawy, Ardia Kraft, MD   [START ON 09/20/2023] pantoprazole (PROTONIX) EC tablet 40 mg, 40 mg, Oral, Q0600, Johnson, Clanford L, MD   potassium chloride SA (KLOR-CON M) CR tablet 40 mEq, 40 mEq, Oral, BID, Johnson, Clanford L, MD, 40 mEq at 09/19/23 0842   psyllium (HYDROCIL/METAMUCIL) 1 packet, 1 packet, Oral, QHS, Johnson, Clanford L, MD   tamsulosin (FLOMAX) capsule 0.4 mg, 0.4 mg, Oral, QPC supper, Johnson, Clanford L, MD, 0.4 mg at  09/19/23 1655   I/O    Intake/Output Summary (Last 24 hours) at 09/19/2023 1720 Last data filed at 09/19/2023 1512 Gross per 24 hour  Intake --  Output 850 ml  Net -850 ml     Physical Exam: Temp:  [97.7 F (36.5 C)-98.4 F (36.9 C)] 97.9 F (36.6 C) (04/13 1523) Pulse Rate:  [53-146] 140 (04/13 1200) Resp:  [15-31] 22 (04/13 1200) BP: (110-155)/(52-95) 126/89 (04/13 1200) SpO2:  [91 %-100 %] 96 % (04/13 1200)  Temp (24hrs), Avg:98 F (36.7 C), Min:97.7 F (36.5 C), Max:98.4 F (36.9 C)  GENERAL: The patient is AO x3, in no acute distress. HEENT: Head is normocephalic and atraumatic. EOMI are intact. Mouth is well hydrated and without lesions. NECK: Supple. No masses LUNGS: Clear to auscultation. No presence of rhonchi/wheezing/rales. Adequate chest expansion HEART: RRR, normal s1 and s2. ABDOMEN: Soft, nontender, no guarding, no peritoneal signs, and nondistended. BS +. No masses.  Laboratory Data: CBC:     Component Value Date/Time   WBC 8.6 09/19/2023 0327   RBC 3.31 (L) 09/19/2023 0327   HGB 9.0 (L) 09/19/2023 0327   HCT 30.0 (L) 09/19/2023 0327   PLT 229 09/19/2023 0327   MCV 90.6 09/19/2023 0327   MCH 27.2 09/19/2023 0327   MCHC 30.0 09/19/2023 0327   RDW 16.3 (H) 09/19/2023 0327   LYMPHSABS 0.4 (L) 09/16/2023 1645   MONOABS 1.2 (H) 09/16/2023 1645   EOSABS 0.0 09/16/2023 1645   BASOSABS 0.0 09/16/2023 1645   COAG: No  results found for: "INR", "PROTIME"  BMP:     Latest Ref Rng & Units 09/19/2023    3:27 AM 09/18/2023    4:38 AM 09/17/2023    4:54 AM  BMP  Glucose 70 - 99 mg/dL 96  89  161   BUN 8 - 23 mg/dL 18  28  36   Creatinine 0.44 - 1.00 mg/dL 0.96  0.45  4.09   Sodium 135 - 145 mmol/L 129  132  127   Potassium 3.5 - 5.1 mmol/L 4.0  2.8  4.0   Chloride 98 - 111 mmol/L 98  96  94   CO2 22 - 32 mmol/L 24  24  23    Calcium 8.9 - 10.3 mg/dL 7.4  7.4  7.6     HEPATIC:     Latest Ref Rng & Units 09/16/2023    4:45 PM 05/24/2023    1:43 PM  01/20/2022    4:21 PM  Hepatic Function  Total Protein 6.5 - 8.1 g/dL 6.4  7.9  8.1   Albumin 3.5 - 5.0 g/dL 3.1  3.7  3.8   AST 15 - 41 U/L 25  26  25    ALT 0 - 44 U/L 16  17  18    Alk Phosphatase 38 - 126 U/L 64  89  95   Total Bilirubin 0.0 - 1.2 mg/dL 0.7  0.8  1.2     CARDIAC: No results found for: "CKTOTAL", "CKMB", "CKMBINDEX", "TROPONINI"    Imaging: I personally reviewed and interpreted the available labs, imaging and endoscopic files.   Assessment/Plan:  This is 89 year old female with history of A-fib on Xarelto, right heart failure presented to ED with fatigue and generalized weakness.  Patient was admitted with symptomatic anemia without any overt GI bleeding.   #Iron deficiency anemia  Ferritin :63 %sat : 6 Patient upper endoscopy and colonoscopy back in October 2024 for evaluation of iron deficiency anemia.  At that time she was found to have gastritis biopsies were positive for H. pylori. Colonoscopy was with grade 4 hemorrhoids.   Patient denies any overt GI bleeding as previously she had fresh blood upon wiping which also has resolved   This is a patient who is presenting with chronic blood loss as hemoglobin 4 months ago was 10 and presenting at 5.9.  There is no evidence of any overt bleed and patient is not actively bleeding as evidenced by appropriate transfusion response since hemoglobin went up by 3 unit PRBC.  There is no indication for any urgent inpatient endoscopic evaluation at this time given no overt bleeding and this appears to be more occult blood loss   Given the etiology of blood loss patient is up-to-date to upper endoscopy and colonoscopy, and with previous assessment this appears to be occult blood loss from grade 4 hemorrhoids/anal lesion/malignancy needs to be excluded.  I had referred the patient to colorectal surgery given anemia , significant comorbidities and need for anticoagulation, but appears patient has not follow up with them .    Recommend following up with either general surgery or colorectal surgery for evaluation of it for hemorrhoids/anal canal lesion.  Will check stool H. pylori for eradication as outpatient after patient is off PPI for 2 to 3 weeks   Herby Amick Faizan Gavynn Duvall, MD Gastroenterology and Hepatology [facility] Gastroenterology   This chart has been completed using [facility] Dictation software, and while attempts have been made to ensure accuracy , certain words and phrases may  not be transcribed as intended

## 2023-09-19 NOTE — Plan of Care (Signed)

## 2023-09-19 NOTE — Plan of Care (Signed)
  Problem: Education: Goal: Knowledge of General Education information will improve Description: Including pain rating scale, medication(s)/side effects and non-pharmacologic comfort measures Outcome: Progressing   Problem: Nutrition: Goal: Adequate nutrition will be maintained Outcome: Progressing   Problem: Coping: Goal: Level of anxiety will decrease Outcome: Progressing   Problem: Elimination: Goal: Will not experience complications related to urinary retention Outcome: Progressing   Problem: Pain Managment: Goal: General experience of comfort will improve and/or be controlled Outcome: Progressing   Problem: Safety: Goal: Ability to remain free from injury will improve Outcome: Progressing

## 2023-09-19 NOTE — Progress Notes (Signed)
   09/19/23 1551  TOC Discharge Assessment  Final next level of care Home w Home Health Services  Once discharged, how will the patient get to their discharge location? Family/Friend - Partnered Transport  Has discharge transport plan been identified? Yes  Barriers to Discharge Continued Medical Work up  Patient states their goals for this hospitalization and ongoing recovery are: Home  HH Arranged RN  [name]  Date [facility] Agency Contacted 09/18/23  Time [facility] Agency Contacted [phone]  Representative spoke with at [facility] Agency Shawn   CM spoke with patient at bedside. Patient reported she will like to be discharged home with [facility] RN.  TOC will continue to monitor patient advancement through interdisciplinary progression rounds.

## 2023-09-20 ENCOUNTER — Inpatient Hospital Stay (HOSPITAL_COMMUNITY)

## 2023-09-20 ENCOUNTER — Telehealth: Payer: Self-pay | Admitting: Gastroenterology

## 2023-09-20 DIAGNOSIS — E039 Hypothyroidism, unspecified: Secondary | ICD-10-CM | POA: Diagnosis not present

## 2023-09-20 DIAGNOSIS — I50810 Right heart failure, unspecified: Secondary | ICD-10-CM

## 2023-09-20 DIAGNOSIS — D62 Acute posthemorrhagic anemia: Secondary | ICD-10-CM | POA: Diagnosis not present

## 2023-09-20 DIAGNOSIS — Z8619 Personal history of other infectious and parasitic diseases: Secondary | ICD-10-CM | POA: Diagnosis not present

## 2023-09-20 DIAGNOSIS — I480 Paroxysmal atrial fibrillation: Secondary | ICD-10-CM | POA: Diagnosis not present

## 2023-09-20 DIAGNOSIS — I4891 Unspecified atrial fibrillation: Secondary | ICD-10-CM

## 2023-09-20 DIAGNOSIS — D649 Anemia, unspecified: Secondary | ICD-10-CM

## 2023-09-20 DIAGNOSIS — I10 Essential (primary) hypertension: Secondary | ICD-10-CM

## 2023-09-20 DIAGNOSIS — Z8719 Personal history of other diseases of the digestive system: Secondary | ICD-10-CM | POA: Diagnosis not present

## 2023-09-20 LAB — CBC
HCT: 31.8 % — ABNORMAL LOW (ref 36.0–46.0)
Hemoglobin: 9.6 g/dL — ABNORMAL LOW (ref 12.0–15.0)
MCH: 27.8 pg (ref 26.0–34.0)
MCHC: 30.2 g/dL (ref 30.0–36.0)
MCV: 92.2 fL (ref 80.0–100.0)
Platelets: 248 10*3/uL (ref 150–400)
RBC: 3.45 MIL/uL — ABNORMAL LOW (ref 3.87–5.11)
RDW: 16.4 % — ABNORMAL HIGH (ref 11.5–15.5)
WBC: 6.1 10*3/uL (ref 4.0–10.5)
nRBC: 2 % — ABNORMAL HIGH (ref 0.0–0.2)

## 2023-09-20 LAB — BASIC METABOLIC PANEL WITH GFR
Anion gap: 7 (ref 5–15)
BUN: 13 mg/dL (ref 8–23)
CO2: 22 mmol/L (ref 22–32)
Calcium: 7.8 mg/dL — ABNORMAL LOW (ref 8.9–10.3)
Chloride: 98 mmol/L (ref 98–111)
Creatinine, Ser: 0.66 mg/dL (ref 0.44–1.00)
GFR, Estimated: >60 mL/min (ref >60)
Glucose, Bld: 97 mg/dL (ref 70–99)
Potassium: 4.9 mmol/L (ref 3.5–5.1)
Sodium: 127 mmol/L — ABNORMAL LOW (ref 135–145)

## 2023-09-20 LAB — D-DIMER, QUANTITATIVE: D-Dimer, Quant: 1.6 ug{FEU}/mL — ABNORMAL HIGH (ref 0.00–0.50)

## 2023-09-20 MED ORDER — DIGOXIN 0.25 MG/ML IJ SOLN
0.2500 mg | Freq: Four times a day (QID) | INTRAMUSCULAR | Status: AC
  Administered 2023-09-20: 0.25 mg via INTRAVENOUS
  Filled 2023-09-20: qty 2

## 2023-09-20 MED ORDER — TECHNETIUM TO 99M ALBUMIN AGGREGATED
4.4000 | Freq: Once | INTRAVENOUS | Status: AC | PRN
  Administered 2023-09-20: 4.4 via INTRAVENOUS

## 2023-09-20 MED ORDER — METOPROLOL SUCCINATE ER 50 MG PO TB24
75.0000 mg | ORAL_TABLET | Freq: Two times a day (BID) | ORAL | Status: DC
  Administered 2023-09-20 – 2023-09-22 (×4): 75 mg via ORAL
  Filled 2023-09-20: qty 2
  Filled 2023-09-20 (×2): qty 1
  Filled 2023-09-20: qty 2

## 2023-09-20 MED ORDER — METOPROLOL SUCCINATE ER 25 MG PO TB24
25.0000 mg | ORAL_TABLET | Freq: Once | ORAL | Status: AC
  Administered 2023-09-20: 25 mg via ORAL
  Filled 2023-09-20: qty 1

## 2023-09-20 MED ORDER — METOPROLOL SUCCINATE ER 50 MG PO TB24: 100.0000 mg | ORAL_TABLET | Freq: Two times a day (BID) | ORAL | Status: DC

## 2023-09-20 MED ORDER — DIGOXIN 0.25 MG/ML IJ SOLN
0.5000 mg | Freq: Once | INTRAMUSCULAR | Status: AC
  Administered 2023-09-20: 0.5 mg via INTRAVENOUS
  Filled 2023-09-20: qty 2

## 2023-09-20 MED ORDER — FUROSEMIDE 10 MG/ML IJ SOLN
40.0000 mg | Freq: Two times a day (BID) | INTRAMUSCULAR | Status: DC
  Administered 2023-09-20 – 2023-09-21 (×3): 40 mg via INTRAVENOUS
  Filled 2023-09-20 (×3): qty 4

## 2023-09-20 MED ORDER — POTASSIUM CHLORIDE CRYS ER 20 MEQ PO TBCR
20.0000 meq | EXTENDED_RELEASE_TABLET | Freq: Every day | ORAL | Status: DC
  Administered 2023-09-21 – 2023-09-22 (×2): 20 meq via ORAL
  Filled 2023-09-20 (×2): qty 1

## 2023-09-20 NOTE — Progress Notes (Signed)
 Gastroenterology Progress Note    Patient ID: Shelly Fields; [id number]; 1934-11-20    Subjective   No abdominal pain, N/V. Granddaughter, Ladona Ridgel, at bedside. BM this morning without blood. No rectal pain. Notes she eats bran cereal each morning with good results. Takes Metamucil as well.    Objective   Vital signs in last 24 hours Temp:  [97.9 F (36.6 C)-98.3 F (36.8 C)] 98.1 F (36.7 C) (04/14 0746) Pulse Rate:  [77-140] 106 (04/14 0915) Resp:  [18-26] 20 (04/14 0832) BP: (111-158)/(64-97) 147/79 (04/14 0915) SpO2:  [88 %-99 %] 95 % (04/14 0832) Last BM Date : 09/19/23  Physical Exam General:   Alert and oriented, pleasant Head:  Normocephalic and atraumatic. Abdomen:  patient sitting up in chair, no abdominal exam.  Neurologic:  Alert and  oriented x4   Intake/Output from previous day: 04/13 0701 - 04/14 0700 In: -  Out: 700 [Urine:700] Intake/Output this shift: Total I/O In: 700 [IV Piggyback:700] Out: -   Lab Results  Recent Labs    09/18/23 0438 09/19/23 0327 09/20/23 0544  WBC 9.9 8.6 6.1  HGB 9.4* 9.0* 9.6*  HCT 29.3* 30.0* 31.8*  PLT 240 229 248   BMET Recent Labs    09/18/23 0438 09/19/23 0327 09/20/23 0544  NA 132* 129* 127*  K 2.8* 4.0 4.9  CL 96* 98 98  CO2 24 24 22   GLUCOSE 89 96 97  BUN 28* 18 13  CREATININE 0.94 0.69 0.66  CALCIUM 7.4* 7.4* 7.8*    Studies/Results DG CHEST PORT 1 VIEW Result Date: 09/18/2023 CLINICAL DATA:  [id number] Aspiration pneumonia (HCC) [id number] EXAM: PORTABLE CHEST - 1 VIEW COMPARISON:  09/16/2023 FINDINGS: Pleural effusions right greater than left with opacities in both lung bases, stable. Heart size and mediastinal contours are within normal limits. Aortic Atherosclerosis (ICD10-170.0). Post lower thoracic cement augmentation. IMPRESSION: Bilateral pleural effusions and basilar opacities. Electronically Signed   By: Shelly Fields M.D.   On: 09/18/2023 08:53   ECHOCARDIOGRAM COMPLETE Result Date:  09/17/2023    ECHOCARDIOGRAM REPORT   Patient Name:   Shelly Fields [facility] Date of Exam: 09/17/2023 Medical Rec #:  [id number]      Height:       62.0 in Accession #:    [id number]     Weight:       110.2 lb Date of Birth:  [birth year]     BSA:          1.484 m Patient Age:    89 years       BP:           149/73 mmHg Patient Gender: F              HR:           85 bpm. [facility] Procedure: 2D Echo, Cardiac Doppler and Color Doppler (Both Spectral and Color            Flow Doppler were utilized during procedure). Indications:    CHF-Acute Diastolic I50.31  History:        Patient has prior history of Echocardiogram examinations, most                 recent 02/25/2022. Arrythmias:Atrial Fibrillation,                 Signs/Symptoms:Syncope; Risk Factors:Hypertension.  Sonographer:    Shelly Fields [facility] Referring Phys: [phone] Shelly Fields IMPRESSIONS  1. Left ventricular ejection fraction, by  estimation, is 70 to 75%. The left ventricle has hyperdynamic function. The left ventricle has no regional wall motion abnormalities. There is mild concentric left ventricular hypertrophy. Left ventricular diastolic parameters are indeterminate. There is the interventricular septum is flattened in systole and diastole, consistent with right ventricular pressure and volume overload.  2. Right ventricular systolic function is mildly reduced. The right ventricular size is severely enlarged. There is normal pulmonary artery systolic pressure. The estimated right ventricular systolic pressure is 30.7 mmHg.  3. Left atrial size was moderately dilated.  4. Right atrial size was severely dilated.  5. Left pleural effusion present. There is a small to moderate pericardial effusion. The pericardial effusion is posterior to the left ventricle.  6. The mitral valve is degenerative. Mild to moderate mitral valve regurgitation.  7. The tricuspid valve is degenerative. Tricuspid valve regurgitation is moderate to severe.  8. The aortic  valve is tricuspid. Aortic valve regurgitation is mild. No aortic stenosis is present.  9. The inferior vena cava is dilated in size with <50% respiratory variability, suggesting right atrial pressure of 15 mmHg. Comparison(s): Prior images reviewed side by side. LVEF vigorous at 70-75%. Severely dilated RV and RA with mild RV dysfunction and normal estimated RVSP. FINDINGS  Left Ventricle: Left ventricular ejection fraction, by estimation, is 70 to 75%. The left ventricle has hyperdynamic function. The left ventricle has no regional wall motion abnormalities. The left ventricular internal cavity size was normal in size. There is mild concentric left ventricular hypertrophy. The interventricular septum is flattened in systole and diastole, consistent with right ventricular pressure and volume overload. Left ventricular diastolic function could not be evaluated due to atrial fibrillation. Left ventricular diastolic parameters are indeterminate. Right Ventricle: The right ventricular size is severely enlarged. Right vetricular wall thickness was not well visualized. Right ventricular systolic function is mildly reduced. There is normal pulmonary artery systolic pressure. The tricuspid regurgitant velocity is 1.98 m/s, and with an assumed right atrial pressure of 15 mmHg, the estimated right ventricular systolic pressure is 30.7 mmHg. Left Atrium: Left atrial size was moderately dilated. Right Atrium: Right atrial size was severely dilated. Pericardium: Left pleural effusion present. A small pericardial effusion is present. The pericardial effusion is posterior to the left ventricle. There is no evidence of cardiac tamponade. Mitral Valve: The mitral valve is degenerative in appearance. There is mild thickening of the mitral valve leaflet(s). Mild to moderate mitral valve regurgitation, with posteriorly-directed jet. Tricuspid Valve: The tricuspid valve is degenerative in appearance. Tricuspid valve regurgitation is  moderate to severe. Aortic Valve: The aortic valve is tricuspid. There is mild aortic valve annular calcification. Aortic valve regurgitation is mild. No aortic stenosis is present. Aortic valve mean gradient measures 2.0 mmHg. Aortic valve peak gradient measures 4.0 mmHg. Aortic valve area, by VTI measures 2.19 cm. Pulmonic Valve: The pulmonic valve was grossly normal. Pulmonic valve regurgitation is trivial. Aorta: The aortic root is normal in size and structure. Venous: The inferior vena cava is dilated in size with less than 50% respiratory variability, suggesting right atrial pressure of 15 mmHg. IAS/Shunts: No atrial level shunt detected by color flow Doppler. Additional Comments: 3D was performed not requiring image post processing on an independent workstation and was indeterminate.  LEFT VENTRICLE PLAX 2D LVIDd:         4.40 cm   Diastology LVIDs:         2.30 cm   LV e' medial:    8.38 cm/s LV PW:  1.10 cm   LV E/e' medial:  14.8 LV IVS:        1.00 cm   LV e' lateral:   11.30 cm/s LVOT diam:     1.90 cm   LV E/e' lateral: 11.0 LV SV:         43 LV SV Index:   29 LVOT Area:     2.84 cm  RIGHT VENTRICLE RV S prime:     11.90 cm/s TAPSE (M-mode): 1.8 cm LEFT ATRIUM              Index        RIGHT ATRIUM           Index LA diam:        3.60 cm  2.43 cm/m   RA Area:     51.20 cm LA Vol (A2C):   108.0 ml 72.77 ml/m  RA Volume:   241.00 ml 162.39 ml/m LA Vol (A4C):   65.5 ml  44.14 ml/m LA Biplane Vol: 84.7 ml  57.07 ml/m  AORTIC VALVE AV Area (Vmax):    1.98 cm AV Area (Vmean):   2.09 cm AV Area (VTI):     2.19 cm AV Vmax:           99.60 cm/s AV Vmean:          65.100 cm/s AV VTI:            0.198 m AV Peak Grad:      4.0 mmHg AV Mean Grad:      2.0 mmHg LVOT Vmax:         69.50 cm/s LVOT Vmean:        48.100 cm/s LVOT VTI:          0.153 m LVOT/AV VTI ratio: 0.77  AORTA Ao Root diam: 3.60 cm MITRAL VALVE                TRICUSPID VALVE MV Area (PHT): 5.54 cm     TR Peak grad:   15.7 mmHg MV  Decel Time: 137 msec     TR Vmax:        198.00 cm/s MR Peak grad: 102.4 mmHg MR Mean grad: 67.0 mmHg     SHUNTS MR Vmax:      506.00 cm/s   Systemic VTI:  0.15 m MR Vmean:     387.0 cm/s    Systemic Diam: 1.90 cm MV E velocity: 124.00 cm/s MV A velocity: 30.10 cm/s MV E/A ratio:  4.12 Teddie Favre MD Electronically signed by Teddie Favre MD Signature Date/Time: 09/17/2023/11:07:55 AM    Final    DG Chest 2 View Result Date: 09/16/2023 CLINICAL DATA:  Shortness of breath. EXAM: CHEST - 2 VIEW COMPARISON:  November 23, 2022. FINDINGS: Stable cardiomediastinal silhouette. Status post kyphoplasty of mid thoracic vertebral body. Mild right pleural effusion is noted with associated right basilar atelectasis or infiltrate. Left lung is unremarkable. IMPRESSION: Mild right pleural effusion is noted with associated right basilar atelectasis or infiltrate. Electronically Signed   By: Rosalene Colon M.D.   On: 09/16/2023 18:33    Assessment  89 y.o. delightful female with a history of afib on Xarelto, CHF, presenting to the ED with weakness and fatigue, and admitted with symptomatic anemia but without any obvious GI bleeding. Hgb 5.9 on admission. Heme positive.   Acute on chronic anemia: receiving 3 units PRBCs with appropriate improvement. Hgb 9.6 this morning. no overt GI bleeding while inpatient. Known  hx of IDA,  s/p colonoscopy/EGD in Oct 2024 with findings of H.pylori gastritis and Grade 4 hemorrhoids. She had been referred to surgery here locally, as [facility] does not take insurance; however, she did not make this appt. She would like to be referred again.   H.pylori: will need outpatient urea breath test or stool antigen with holding PPI X 14 days prior.   As no overt GI bleeding while inpatient and Hgb improving, GI will sign off.    Plan / Recommendations  Follow-up as outpatient in 2-4 weeks Outpatient H.pylori eradication testing: will discuss at outpatient visit and hold PPI X 2 weeks prior to  testing. Repeat CBC later this week to ensure stable Refer back to Gen Surgery regarding possible hemorrhoid repair  Recommend capsule as outpatient if further occult GI blood loss, especially in light of no active bleeding currently.     LOS: 4 days    09/20/2023, 9:37 AM  Delman Ferns, PhD, ANP-BC [facility] Gastroenterology

## 2023-09-20 NOTE — Plan of Care (Signed)
  Problem: Education: Goal: Knowledge of General Education information will improve Description: Including pain rating scale, medication(s)/side effects and non-pharmacologic comfort measures Outcome: Progressing   Problem: Activity: Goal: Risk for activity intolerance will decrease Outcome: Progressing   Problem: Nutrition: Goal: Adequate nutrition will be maintained Outcome: Progressing   Problem: Coping: Goal: Level of anxiety will decrease Outcome: Progressing   Problem: Elimination: Goal: Will not experience complications related to bowel motility Outcome: Progressing Goal: Will not experience complications related to urinary retention Outcome: Progressing   Problem: Pain Managment: Goal: General experience of comfort will improve and/or be controlled Outcome: Progressing   Problem: Safety: Goal: Ability to remain free from injury will improve Outcome: Progressing

## 2023-09-20 NOTE — Progress Notes (Signed)
 PROGRESS NOTE   Shelly Fields  NWG:[id number] DOB: 12-24-1934 DOA: 09/16/2023 PCP: Orlena Bitters, MD   Chief Complaint  Patient presents with   Shortness of Breath    Per EMS O2 sats in the 60s on RA upon arrival. Pt lethargic.   Level of care: Stepdown  Brief Admission History:  89 y.o. female with history of permanent Afib, pulmonology, HFpEF cor pulmonale, unable bronchiectasis, hypothyroidism and HTN presented to ED secondary to generalized weakness, fatigue and lethargy, denies chest pain, fall, fever, chills, patient on Xarelto for A-fib, patient with known history of hemorrhoids, diverticulosis, gastritis with H. pylori during recent endoscopy and colonoscopy on October 2024, patient herself denies any coffee-ground emesis, melena or bright red blood per rectum, patient recently had to hold her Demadex for squamous cell resection surgery last Monday, she was told by her PCP to resume with increased doses yesterday as she is having significant edema and fluid retention. - In ED her hemoglobin came back low at 5.9, she was Hemoccult positive, so she received reversal agent Andexxa for Xarelto, ordered 3 units PRBC, she was dyspneic, hypoxic, and in a frail weakened state,  chest x-ray significant for pleural effusion, opacity and volume overload, started on IV Lasix and antibiotics, [facility] hospitalist consulted to admit.   Assessment and Plan:  Acute blood loss anemia Symptomatic anemia GI bleed -Patient presents with hemoglobin of 5.9, symptomatic, she is ordered 3 units PRBC transfusion. -Xarelto on hold, she received Andexxa for reversal -Endoscopy, colonoscopy last October, significant for gastritis, H. pylori, diverticulosis with hemorrhoids - s/p transfused 3 units PRBC, monitor CBC closely. - advance diet as tolerated  -GI consult appreciated, for now follow Hg, recently had procedures, may need outpatient hematology consult  -Continue with Protonix for GI protection -would  not restart anticoagulation given high risk for recurrent severe bleeding with limit treatment options, discussed with patient and son and they verbalized understanding  -GI team referring patient to surgery for evaluation of treatment for hemorrhoids   Acute respiratory failure with hypoxia Duration of pneumonia/right lower lobe pneumonia Acute on chronic diastolic CHF Right heart failure/cor pulmonale - Patient off nonrebreather with much improvement - the underlying problem was urinary retention she had more than 500 cc in bladder - continue foley for now, add flomax with supper - Chest x-ray significant for volume overload, as well having elevated BNP, she was started on IV diuresis, repeat 2D echo.  Hold Farxiga for now - Right lung base opacity, she had an episode of vomiting Tuesday morning, will start on IV Unasyn - Blood pressures stabilizing with diuresis - As discussed with son, patient is DNR, no heroics, but okay to continue above measures like pressors via peripheral and BiPAP. - cardiology ordered D dimer which is elevated and requesting V/Q scan to rule out PE   Acute urinary retention  - foley cath placed with good urine output - foley removed 4/12, she has been voiding and we are monitoring closely  - added flomax on 4/11  Essential hypertension.  - Continue with home metoprolol   Atrial fibrillation with RVR  - HR up to 130-140s - further review of home med rec and she has tolerated doses of metoprolol XL up to 75 mg BID - Hold Xarelto due to above, would not restart due to severe bleeding complication - cardiology team increased Toprol-XL to 100 mg BID with hold parameters - - keep in stepdown ICU in case IV infusion is needed for better HR  control -- follow up V/Q scan to rule out DVT   Hypothyroidism -Continue with home levothyroxine - TSH and free T4 elevated but difficult to interpret in setting of critical illness - will need to have rechecked outpatient when  not in ICU setting   Hyponatremia - Due to volume overload, should improve with diuresis - lasix increased to BID by cardiology team   Hypokalemia - oral potassium replacement reduced to 20 meq    AKI - Stable cardiorenal, monitor closely on IV diuresis   Squamous cell carcinoma status post recent resection on Monday -Wound looks stable  DVT prophylaxis: SCDs Code Status: DNR  Family Communication: son at bedside updated 4/12, 4/13  Disposition: anticipate return home with [facility]    Consultants:  GI Cardio  Procedures:  None planned  Antimicrobials:    Subjective: Pt says she feels well, she is asymptomatic from the afib RVR  Objective: Vitals:   09/20/23 1200 09/20/23 1300 09/20/23 1400 09/20/23 1450  BP: (!) 160/94 (!) 144/69  (!) 158/74  Pulse: (!) 139 73 77   Resp:      Temp:      TempSrc:      SpO2: 90% 98% 98%   Weight:      Height:        Intake/Output Summary (Last 24 hours) at 09/20/2023 1502 Last data filed at 09/20/2023 1200 Gross per 24 hour  Intake 700 ml  Output 1300 ml  Net -600 ml   Filed Weights   09/16/23 1617 09/16/23 2142  Weight: 47 kg 50 kg   Examination:  General exam: Appears calm and comfortable  Respiratory system: diminished BS at bases but mostly clear   Cardiovascular system: irregularly irregular and tachycardic; normal S1 & S2 heard. No JVD, murmurs, rubs, gallops or clicks. No pedal edema. Gastrointestinal system: Abdomen is nondistended, soft and nontender. No organomegaly or masses felt. Normal bowel sounds heard. Central nervous system: Alert and oriented. No focal neurological deficits. Extremities: Symmetric 5 x 5 power. Skin: No rashes, lesions or ulcers. Psychiatry: Judgement and insight appear normal. Mood & affect appropriate.   Data Reviewed: I have personally reviewed following labs and imaging studies  CBC: Recent Labs  Lab 09/16/23 1645 09/17/23 0454 09/18/23 0438 09/19/23 0327 09/20/23 0544  WBC 8.4  11.0* 9.9 8.6 6.1  NEUTROABS 6.7  --   --   --   --   HGB 5.9* 9.7* 9.4* 9.0* 9.6*  HCT 20.8* 31.2* 29.3* 30.0* 31.8*  MCV 94.1 87.6 88.3 90.6 92.2  PLT 386 275 240 229 248    Basic Metabolic Panel: Recent Labs  Lab 09/16/23 1645 09/17/23 0454 09/18/23 0438 09/18/23 0820 09/19/23 0327 09/20/23 0544  NA 124* 127* 132*  --  129* 127*  K 4.3 4.0 2.8*  --  4.0 4.9  CL 89* 94* 96*  --  98 98  CO2 22 23 24   --  24 22  GLUCOSE 137* 112* 89  --  96 97  BUN 41* 36* 28*  --  18 13  CREATININE 1.21* 1.00 0.94  --  0.69 0.66  CALCIUM 8.1* 7.6* 7.4*  --  7.4* 7.8*  MG  --   --   --  2.6* 2.3  --     CBG: No results for input(s): "GLUCAP" in the last 168 hours.  Recent Results (from the past 240 hours)  MRSA Next Gen by PCR, Nasal     Status: None   Collection Time: 09/16/23  7:30 PM   Specimen: Nasal Mucosa; Nasal Swab  Result Value Ref Range Status   MRSA by PCR Next Gen NOT DETECTED NOT DETECTED Final    Comment: (NOTE) The GeneXpert MRSA Assay (FDA approved for NASAL specimens only), is one component of a comprehensive MRSA colonization surveillance program. It is not intended to diagnose MRSA infection nor to guide or monitor treatment for MRSA infections. Test performance is not FDA approved in patients less than 43 years old. Performed at [facility], [address], [place], [place] [address]      Radiology Studies: No results found.  Scheduled Meds:  Chlorhexidine Gluconate Cloth  6 each Topical Daily   digoxin  0.25 mg Intravenous Q6H   furosemide  40 mg Intravenous BID   levothyroxine  75 mcg Oral QAC breakfast   metoprolol succinate  75 mg Oral BID   pantoprazole  40 mg Oral Q0600   [START ON 09/21/2023] potassium chloride  20 mEq Oral Daily   psyllium  1 packet Oral QHS   tamsulosin  0.4 mg Oral QPC supper   Continuous Infusions:   LOS: 4 days   Time spent: 55 mins  Anaeli Cornwall Lincoln Renshaw, MD How to contact the [facility] Attending or Consulting provider 7A - 7P  or covering provider during after hours 7P -7A, for this patient?  Check the care team in [facility] and look for a) attending/consulting TR[facility] provider listed and b) the TRH team listed Log into [email or web address] to find provider on call.  Locate the TR[facility] provider you are looking for under [facility] and page to a number that you can be directly reached. If you still have difficulty reaching the provider, please page the [facility] (Director on Call) for the Hospitalists listed on amion for assistance.  09/20/2023, 3:02 PM

## 2023-09-20 NOTE — Plan of Care (Signed)

## 2023-09-20 NOTE — Telephone Encounter (Signed)
 Please arrange outpatient hospital follow-up in 2-4 weeks with Dr. Alita Irwin, myself, or Jearlean Mince.  Tammy/Courtney: can we have patient repeat CBC at end of week? Thanks!

## 2023-09-20 NOTE — Evaluation (Signed)
 Physical Therapy Brief Evaluation and Discharge Note Patient Details Name: Shelly Fields MRN: [id number] DOB: [birth year] Today's Date: 09/20/2023   History of Present Illness  Shelly Fields  is a 89 y.o. female,   with history of permanent Afib, pulmonology, HFpEF cor pulmonale, unable bronchiectasis, hypothyroidism and HTN.    -Presents to ED secondary to generalized weakness, fatigue and lethargy, denies any chest pain, fall, fever, chills, patient on Xarelto for A-fib, patient with known history of hemorrhoids, diverticulosis, gastritis with H. pylori during recent endoscopy and colonoscopy on October 2024, patient herself denies any coffee-ground emesis, melena or bright red blood per rectum, patient recently had to hold her Demadex for squamous cell resection surgery last Monday, she was told by her PCP to resume with increased doses yesterday as she is having significant edema and fluid retention. (per MD)  Clinical Impression  Pt tolerating PT evaluation well. Pt ambulated 76ft into hallway with RW at modified independent, pt was moving fast, had to cue to slow down for this PT. Pt was transitioned to transporter for nuclear testing. Patient is performing at functional baseline, no safety concerns noted throughout evaluation.  Patient is safe to DC home with appropriate services, DME, and any other needs determined by care team.  PT to sign off at this time. PT discharging pt to mobility technician and nursing staff for further mobility activities. Please reconsult acute PT services if functional changes occur.  Thank you.       PT Assessment    Assistance Needed at Discharge       Equipment Recommendations None recommended by PT  Recommendations for Other Services       Precautions/Restrictions          Mobility  Bed Mobility          Transfers Overall transfer level: Modified independent Equipment used: Rolling walker (2 wheels) Transfers: Sit to/from Stand Sit to  Stand: Modified independent (Device/Increase time)           General transfer comment: power to stand from recliner to RW at modified independent; controlled stand to sit into [facility] with transporter after ambulation mod I.    Ambulation/Gait Ambulation/Gait assistance: Modified independent (Device/Increase time) Gait Distance (Feet): 50 Feet Assistive device: Rolling walker (2 wheels) Gait Pattern/deviations: WFL(Within Functional Limits)   General Gait Details: Safe, steady ambulation with apparent deficits, modified independent out in ICU hallway  Home Activity Instructions    Stairs            Modified Rankin (Stroke Patients Only)        Balance   Sitting-balance support: No upper extremity supported Sitting balance-Leahy Scale: Normal Sitting balance - Comments: sitting in recliner   Standing balance support: Bilateral upper extremity supported, During functional activity, Reliant on assistive device for balance Standing balance-Leahy Scale: Good Standing balance comment: good<>good w/ RW          Pertinent Vitals/Pain   Pain Assessment Pain Assessment: No/denies pain     Home Living   Living Arrangements: Alone;Other (Comment) (sons live on either side of her home [place].)       Home Equipment: Agricultural consultant (2 wheels);BSC/3in1        Prior Function        UE/LE Assessment               Communication   Communication Communication: No apparent difficulties Factors Affecting Communication: Hearing impaired     Cognition  General Comments      Exercises     Assessment/Plan    PT Problem List         PT Visit Diagnosis Muscle weakness (generalized) (M62.81)    No Skilled PT Patient is independent with all acitivity/mobility;Patient is modified independent with all activity/mobility   Co-evaluation                AMPAC 6 Clicks Help needed turning from your back to your side while in a flat  bed without using bedrails?: None Help needed moving from lying on your back to sitting on the side of a flat bed without using bedrails?: None Help needed moving to and from a bed to a chair (including a wheelchair)?: None Help needed standing up from a chair using your arms (e.g., wheelchair or bedside chair)?: None Help needed to walk in hospital room?: None Help needed climbing 3-5 steps with a railing? : None 6 Click Score: 24      End of Session Equipment Utilized During Treatment: Gait belt   Patient left: in chair Nurse Communication: Mobility status PT Visit Diagnosis: Muscle weakness (generalized) (M62.81)     Time: 6045-4098 PT Time Calculation (min) (ACUTE ONLY): 10 min  Charges:   PT Evaluation $PT Eval Low Complexity: 1 Low      Astrid Lay, DPT [facility] [place] [phone] office  [name]  09/20/2023, 5:17 PM

## 2023-09-20 NOTE — Progress Notes (Addendum)
 Patient Name: Shelly Fields Date of Encounter: 09/20/2023 [facility] Cardiologist: Nona Dell, MD    Interval Summary  .    Feeling better, was hoping to go home.  Vital Signs .    Vitals:   09/20/23 0701 09/20/23 0705 09/20/23 0746 09/20/23 0832  BP: (!) 153/97   (!) 151/64  Pulse: (!) 132 97  (!) 129  Resp: 19 20  20   Temp:   98.1 F (36.7 C)   TempSrc:   Oral   SpO2: 93%   95%  Weight:      Height:        Intake/Output Summary (Last 24 hours) at 09/20/2023 0916 Last data filed at 09/20/2023 0907 Gross per 24 hour  Intake 700 ml  Output 700 ml  Net 0 ml      09/16/2023    9:42 PM 09/16/2023    4:17 PM 09/13/2023    7:16 AM  Last 3 Weights  Weight (lbs) 110 lb 3.7 oz 103 lb 9.6 oz 98 lb 8.7 oz  Weight (kg) 50 kg 46.993 kg 44.7 kg      Telemetry/ECG    Afib 110-125/m - Personally Reviewed  Physical Exam .   GEN: No acute distress.   Neck: No JVD Cardiac: irreg irreg 110/m.  Respiratory: Crackles at bases bilaterally. GI: Soft, nontender, non-distended  MS: No edema  Assessment & Plan .    Permanent atrial fibrillation with CHA2DS2-VASc score of 4.  Patient presents with symptomatic severe anemia, hemoglobin initially 5.9.  Xarelto 15 mg daily was stopped and she was reversed with Andexxa.  Otherwise  on Toprol XL 75 mg  bid for heart rate control. HR's 110-125 at rest. Will increase Toprol XL 100 mg bid    HFpEF with LVEF 70 to 75% by follow-up echocardiogram.  Fluid status also complicated by RV dysfunction with severely dilated right ventricle and mildly reduced contraction, at this point normal estimated RVSP although she has had evidence of intermittent pulmonary hypertension.  Most likely WHO group 2 and 3 (history of bronchiectasis).  Managed conservatively with medical therapy.  She has had more trouble with fluid retention recently, currently on IV Lasix and diuresing.  I/O's negative 2L, no recent weight or ReDS Vest. [name] held at the  present time. Sodium down 127. Was on Demedex 40 mg daily PTA. Would transition to oral diuretics. Per Dr. Diona Browner "Would not pursue aggressive workup of RV dysfunction (no right heart catheterization)."     Symptomatic severe anemia, initial hemoglobin 5.9  status post transfusion of 3 units PRBCs with hemoglobin up to 9.6.  Reports "dark" stools suggestive of melena, heme positive.  She has a history of Helicobacter pylori gastritis and also internal and external hemorrhoids by GI workup in October of last year.  Follow-up GI consultation pending.           For questions or updates, please contact [facility] Please consult [email or web address] for contact info under        Signed, Jacolyn Reedy, PA-C     Patient seen and independently examined with Jacolyn Reedy, PA-C. We discussed all aspects of the encounter. I agree with the assessment and plan as stated above.  Briefly, 89 year old F known to have permanent A-fib, RV failure presented with severe fatigue, DOE and leg swelling.  Labs remarkable for acute blood loss anemia (hemoglobin dropped from 10 to 5.9).  Imaging remarkable for pulmonary vascular congestion.  Currently on IV Lasix 40 mg  once daily.  Telemetry reviewed, in atrial fibrillation with RVR, HR ranging between 100 and 136 BPM.  She has no symptoms.  Leg swelling improved with IV Lasix.  Exam showed patient not in distress, HEENT normal, irregular rate and rhythm, bibasilar Rales present, no edema in the legs.   Acute on chronic diastolic heart failure and RV heart failure, improving: Currently on IV Lasix 40 mg once daily, will switch to 40 mg twice daily. She has chronic severe RV enlargement. Conservative management was recommended.  No aggressive procedures including right heart cath.  Permanent atrial fibrillation with RVR: Rates poorly controlled likely secondary to ADHF and pneumonia. Will increase the Lasix frequency as stated above.  Currently on antibiotics.   Will administer digoxin load today and reassess her HR response.  Severe symptomatic anemia from acute blood loss anemia: No macroscopic GI bleed according to the patient.  Unremarkable GI workup in 2024 except for Helicobacter pylori gastritis and hemorrhoids.  GI following.  Recommended outpatient general surgery or colorectal surgeon follow-up to rule out ano rectal lesion.   [name] Priya Courteny Egler, MD [place]  [facility]  11:37 AM

## 2023-09-21 ENCOUNTER — Other Ambulatory Visit: Payer: Self-pay | Admitting: *Deleted

## 2023-09-21 DIAGNOSIS — I480 Paroxysmal atrial fibrillation: Secondary | ICD-10-CM | POA: Diagnosis not present

## 2023-09-21 DIAGNOSIS — E039 Hypothyroidism, unspecified: Secondary | ICD-10-CM | POA: Diagnosis not present

## 2023-09-21 DIAGNOSIS — D509 Iron deficiency anemia, unspecified: Secondary | ICD-10-CM

## 2023-09-21 DIAGNOSIS — D62 Acute posthemorrhagic anemia: Secondary | ICD-10-CM | POA: Diagnosis not present

## 2023-09-21 DIAGNOSIS — I272 Pulmonary hypertension, unspecified: Secondary | ICD-10-CM | POA: Diagnosis not present

## 2023-09-21 LAB — CBC
HCT: 32.2 % — ABNORMAL LOW (ref 36.0–46.0)
Hemoglobin: 9.8 g/dL — ABNORMAL LOW (ref 12.0–15.0)
MCH: 27.6 pg (ref 26.0–34.0)
MCHC: 30.4 g/dL (ref 30.0–36.0)
MCV: 90.7 fL (ref 80.0–100.0)
Platelets: 245 10*3/uL (ref 150–400)
RBC: 3.55 MIL/uL — ABNORMAL LOW (ref 3.87–5.11)
RDW: 15.9 % — ABNORMAL HIGH (ref 11.5–15.5)
WBC: 6.2 10*3/uL (ref 4.0–10.5)
nRBC: 0.6 % — ABNORMAL HIGH (ref 0.0–0.2)

## 2023-09-21 LAB — BASIC METABOLIC PANEL WITH GFR
Anion gap: 8 (ref 5–15)
BUN: 14 mg/dL (ref 8–23)
CO2: 23 mmol/L (ref 22–32)
Calcium: 7.7 mg/dL — ABNORMAL LOW (ref 8.9–10.3)
Chloride: 96 mmol/L — ABNORMAL LOW (ref 98–111)
Creatinine, Ser: 0.72 mg/dL (ref 0.44–1.00)
GFR, Estimated: >60 mL/min (ref >60)
Glucose, Bld: 83 mg/dL (ref 70–99)
Potassium: 3.6 mmol/L (ref 3.5–5.1)
Sodium: 127 mmol/L — ABNORMAL LOW (ref 135–145)

## 2023-09-21 MED ORDER — FUROSEMIDE 40 MG PO TABS
40.0000 mg | ORAL_TABLET | Freq: Every day | ORAL | Status: DC
  Administered 2023-09-22: 40 mg via ORAL
  Filled 2023-09-21: qty 1

## 2023-09-21 NOTE — Progress Notes (Signed)
 Mobility Specialist Progress Note:    09/21/23 1300  Mobility  Activity Ambulated with assistance in hallway  Level of Assistance Modified independent, requires aide device or extra time  Assistive Device Front wheel walker  Distance Ambulated (ft) 100 ft  Range of Motion/Exercises Active;All extremities  Activity Response Tolerated well  Mobility Referral Yes  Mobility visit 1 Mobility  Mobility Specialist Start Time (ACUTE ONLY) 1230  Mobility Specialist Stop Time (ACUTE ONLY) 1300  Mobility Specialist Time Calculation (min) (ACUTE ONLY) 30 min   Pt received in chair, son in room. Agreeable to mobility, ModI to stand and ambulate with RW. Tolerated well, HR ranged from 70-85 bpm throughout session. Returned to room, left pt in chair. All needs met.  Glinda Lapping Mobility Specialist Please contact via Special educational needs teacher or  Rehab office at [phone]

## 2023-09-21 NOTE — Progress Notes (Signed)
 Mobility Specialist Progress Note:    09/21/23 1608  Mobility  Activity Ambulated with assistance in hallway  Level of Assistance Modified independent, requires aide device or extra time  Assistive Device Front wheel walker  Distance Ambulated (ft) 150 ft  Range of Motion/Exercises Active;All extremities  Activity Response Tolerated well  Mobility Referral Yes  Mobility visit 1 Mobility  Mobility Specialist Start Time (ACUTE ONLY) 1540  Mobility Specialist Stop Time (ACUTE ONLY) 1600  Mobility Specialist Time Calculation (min) (ACUTE ONLY) 20 min   Pt received in bed, eager for mobility. ModI to stand and ambulate with RW. Tolerated well, asx throughout. Returned pt supine, all needs met.   Glinda Lapping Mobility Specialist Please contact via Special educational needs teacher or  Rehab office at [phone]

## 2023-09-21 NOTE — Progress Notes (Signed)
 Progress Note  Patient Name: Shelly Fields Date of Encounter: 09/21/2023  Primary Cardiologist: Nona Dell, MD  Subjective   No symptoms.  Telemetry reviewed, in atrial fibrillation, HR controlled 60 bpm after digoxin load was administered.  Currently on IV Lasix 40 mg twice daily.  She underwent VQ scan and ultrasound lower extremity DVT study that were negative.  Inpatient Medications    Scheduled Meds:  Chlorhexidine Gluconate Cloth  6 each Topical Daily   furosemide  40 mg Intravenous BID   levothyroxine  75 mcg Oral QAC breakfast   metoprolol succinate  75 mg Oral BID   pantoprazole  40 mg Oral Q0600   potassium chloride  20 mEq Oral Daily   psyllium  1 packet Oral QHS   tamsulosin  0.4 mg Oral QPC supper   Continuous Infusions:  PRN Meds: acetaminophen **OR** acetaminophen, ALPRAZolam, levalbuterol, Muscle Rub, ondansetron **OR** ondansetron (ZOFRAN) IV   Vital Signs    Vitals:   09/21/23 0500 09/21/23 0600 09/21/23 0744 09/21/23 0905  BP: (!) 127/32 (!) 127/37  (!) 159/42  Pulse: 65 60  68  Resp: 19 (!) 24    Temp:   98 F (36.7 C)   TempSrc:   Oral   SpO2: 94% 92%    Weight:      Height:        Intake/Output Summary (Last 24 hours) at 09/21/2023 1025 Last data filed at 09/21/2023 0819 Gross per 24 hour  Intake 240 ml  Output 600 ml  Net -360 ml   Filed Weights   09/16/23 1617 09/16/23 2142  Weight: 47 kg 50 kg    Telemetry     Personally reviewed.  Atrial fibrillation, HR 60s.  ECG   Not performed today   Physical Exam   GEN: No acute distress.   Neck: No JVD. Cardiac: Irregular rate and rhythm, no murmur, rub, or gallop.  Respiratory: Nonlabored. Clear to auscultation bilaterally. GI: Soft, nontender, bowel sounds present. MS: No edema; No deformity. Neuro:  Nonfocal. Psych: Alert and oriented x 3. Normal affect.  Labs    Chemistry Recent Labs  Lab 09/16/23 1645 09/17/23 0454 09/19/23 0327 09/20/23 0544 09/21/23 0342   NA 124*   < > 129* 127* 127*  K 4.3   < > 4.0 4.9 3.6  CL 89*   < > 98 98 96*  CO2 22   < > 24 22 23   GLUCOSE 137*   < > 96 97 83  BUN 41*   < > 18 13 14   CREATININE 1.21*   < > 0.69 0.66 0.72  CALCIUM 8.1*   < > 7.4* 7.8* 7.7*  PROT 6.4*  --   --   --   --   ALBUMIN 3.1*  --   --   --   --   AST 25  --   --   --   --   ALT 16  --   --   --   --   ALKPHOS 64  --   --   --   --   BILITOT 0.7  --   --   --   --   GFRNONAA 44*   < > >60 >60 >60  ANIONGAP 13   < > 7 7 8    < > = values in this interval not displayed.     Hematology Recent Labs  Lab 09/19/23 0327 09/20/23 0544 09/21/23 0342  WBC 8.6 6.1 6.2  RBC 3.31* 3.45* 3.55*  HGB 9.0* 9.6* 9.8*  HCT 30.0* 31.8* 32.2*  MCV 90.6 92.2 90.7  MCH 27.2 27.8 27.6  MCHC 30.0 30.2 30.4  RDW 16.3* 16.4* 15.9*  PLT 229 248 245    Cardiac Enzymes Recent Labs  Lab 09/16/23 1645 09/16/23 1845  TROPONINIHS 22* 19*    BNP Recent Labs  Lab 09/16/23 1645  BNP 636.0*     DDimer Recent Labs  Lab 09/20/23 1139  DDIMER 1.60*     Radiology    US  Venous Img Lower Bilateral (DVT) Result Date: 09/20/2023 CLINICAL DATA:  Positive D-dimer EXAM: BILATERAL LOWER EXTREMITY VENOUS DOPPLER ULTRASOUND TECHNIQUE: Gray-scale sonography with compression, as well as color and duplex ultrasound, were performed to evaluate the deep venous system(s) from the level of the common femoral vein through the popliteal and proximal calf veins. COMPARISON:  None Available. FINDINGS: VENOUS Normal compressibility of the common femoral, superficial femoral, and popliteal veins, as well as the visualized calf veins. Visualized portions of profunda femoral vein and great saphenous vein unremarkable. No filling defects to suggest DVT on grayscale or color Doppler imaging. Doppler waveforms show normal direction of venous flow, normal respiratory plasticity and response to augmentation. OTHER None. Limitations: none IMPRESSION: 1. Negative. Electronically  Signed   By: Worthy Heads M.D.   On: 09/20/2023 22:19   NM Pulmonary Perfusion Result Date: 09/20/2023 CLINICAL DATA:  Pulmonary embolism EXAM: NUCLEAR MEDICINE PERFUSION LUNG SCAN TECHNIQUE: Perfusion images were obtained in multiple projections after intravenous injection of radiopharmaceutical. Ventilation scans intentionally deferred if perfusion scan and chest x-ray adequate for interpretation during COVID 19 epidemic. RADIOPHARMACEUTICALS:  4.4 mCi Tc-14m MAA IV COMPARISON:  None available. Findings are correlated with chest radiograph 09/18/2018 FINDINGS: Bilateral posterobasal incomplete perfusion defects related to bilateral pleural effusions. No other segmental filling defects identified. No evidence of acute pulmonary embolism. IMPRESSION: No evidence of acute pulmonary embolism. Electronically Signed   By: Worthy Heads M.D.   On: 09/20/2023 22:18    Assessment & Plan    Acute on chronic diastolic heart failure and RV heart failure, improving: SOB and leg swelling resolved.  She does have imaging evidence of small bilateral pleural effusions.  Currently on IV Lasix 40 mg twice daily.  Will obtain redsvest.  She appears to be volume compensated, will switch IV Lasix to p.o. Lasix 40 mg once daily.  Permanent atrial fibrillation with RVR: Rates adequately controlled now after digoxin load.  Telemetry reviewed, in atrial fibrillation and HR around 60s.  Will not recommend maintenance dose of digoxin.  Continue current rate controlling medications.  Metoprolol succinate 75 mg twice daily.  Xarelto discontinued this admission due to acute blood loss anemia.  Will avoid systemic AC.   Severe symptomatic anemia from acute blood loss anemia: No microscopic GI bleed according to the patient.  Unremarkable GI workup in 2024 except for Helicobacter pylori gastritis and hemorrhoids.  GI recommended outpatient GI surgery or colorectal surgeon follow-up to rule out ano rectal  lesion.      Signed, Lasalle Pointer, MD  09/21/2023, 10:25 AM

## 2023-09-21 NOTE — Progress Notes (Signed)
 PROGRESS NOTE   Shelly Fields  EXB:[id number] DOB: Dec 19, 1934 DOA: 09/16/2023 PCP: Orlena Bitters, MD   Chief Complaint  Patient presents with   Shortness of Breath    Per EMS O2 sats in the 60s on RA upon arrival. Pt lethargic.   Level of care: Stepdown  Brief Admission History:  89 y.o. female with history of permanent Afib, pulmonology, HFpEF cor pulmonale, unable bronchiectasis, hypothyroidism and HTN presented to ED secondary to generalized weakness, fatigue and lethargy, denies chest pain, fall, fever, chills, patient on Xarelto for A-fib, patient with known history of hemorrhoids, diverticulosis, gastritis with H. pylori during recent endoscopy and colonoscopy on October 2024, patient herself denies any coffee-ground emesis, melena or bright red blood per rectum, patient recently had to hold her Demadex for squamous cell resection surgery last Monday, she was told by her PCP to resume with increased doses yesterday as she is having significant edema and fluid retention. - In ED her hemoglobin came back low at 5.9, she was Hemoccult positive, so she received reversal agent Andexxa for Xarelto, ordered 3 units PRBC, she was dyspneic, hypoxic, and in a frail weakened state,  chest x-ray significant for pleural effusion, opacity and volume overload, started on IV Lasix and antibiotics, [facility] hospitalist consulted to admit.   Assessment and Plan:  Acute blood loss anemia Symptomatic anemia GI bleed -Patient presented with hemoglobin of 5.9, she was very symptomatic, she received 3 units PRBC transfusion. -Xarelto discontinued, she received Andexxa for reversal -Endoscopy, colonoscopy last October, significant for gastritis, H. pylori, diverticulosis with hemorrhoids -s/p transfused 3 units PRBC, monitored CBC closely. -advance diet as tolerated  -GI consult appreciated, for now follow Hg, recently had procedures, may need outpatient hematology consult.   -Continue with Protonix for GI  protection -would not restart anticoagulation given high risk for recurrent severe bleeding with limit treatment options, discussed with patient and son and they verbalized understanding  -GI team referring patient to outpatient surgery for evaluation of treatment for hemorrhoids   Acute respiratory failure with hypoxia Duration of pneumonia/right lower lobe pneumonia Acute on chronic diastolic CHF Right heart failure/cor pulmonale - Patient off nonrebreather with much improvement - the underlying problem was urinary retention she had more than 500 cc in bladder - placed foley, added flomax with supper, foley subsequently removed and has been voiding - Chest x-ray significant for volume overload, as well having elevated BNP, she was started on IV diuresis, repeat 2D echo.  Holding home dapagliflozin for now - Right lung base opacity, she had an episode of vomiting Tuesday morning, was started on IV Unasyn for presumed aspiration pneumonia and completed course.  - Blood pressures stabilizing with diuresis - As discussed with son, patient is DNR, no heroics, but okay to continue above measures like pressors via peripheral and BiPAP if needed. - cardiology ordered D dimer which was elevated and requested V/Q scan to rule out PE- this study was negative as well as dopplers of legs negative for DVT.    Acute urinary retention  - foley cath placed with good urine output - foley removed 4/12, she has been voiding and we are monitoring closely  - added flomax on 4/11  Essential hypertension.  - Continue with home metoprolol, now on succinate 75 mg BID    Atrial fibrillation with RVR  - HR up to 130-140s - further review of home med rec and she has tolerated doses of metoprolol XL up to 75 mg BID - Discontinued Xarelto  due to above, would not restart due to severe bleeding complication - cardiology team managing metoprolol so   Hypothyroidism -Continue with home levothyroxine - TSH and free  T4 elevated but difficult to interpret in setting of critical illness - will need to have rechecked outpatient when not in ICU setting   Hyponatremia - Due to volume overload, should improve with diuresis - lasix increased to BID by cardiology team but now resuming oral lasix 40 mg daily   Hypokalemia - oral potassium replacement reduced to 20 meq    AKI - Stable cardiorenal, monitor closely on IV diuresis   Squamous cell carcinoma status post recent resection on Monday -Wound looks stable  DVT prophylaxis: SCDs Code Status: DNR  Family Communication: son at bedside updated 4/12, 4/13, 4/15   Disposition: anticipate return home with [facility] possibly tomorrow if cleared by cardiology, can move to telemetry bed today out of stepdown ICU     Consultants:  GI Cardio  Procedures:  None planned  Antimicrobials:    Subjective: Pt is feeling better with current treatments, eager to work with PT and ambulate   Objective: Vitals:   09/21/23 0500 09/21/23 0600 09/21/23 0744 09/21/23 0905  BP: (!) 127/32 (!) 127/37  (!) 159/42  Pulse: 65 60  68  Resp: 19 (!) 24    Temp:   98 F (36.7 C)   TempSrc:   Oral   SpO2: 94% 92%    Weight:      Height:        Intake/Output Summary (Last 24 hours) at 09/21/2023 1202 Last data filed at 09/21/2023 0819 Gross per 24 hour  Intake 240 ml  Output --  Net 240 ml   Filed Weights   09/16/23 1617 09/16/23 2142  Weight: 47 kg 50 kg   Examination:  General exam: Appears calm and comfortable, up in chair.   Respiratory system: diminished BS at bases but mostly clear   Cardiovascular system: irregularly irregular and tachycardic; normal S1 & S2 heard. No JVD, murmurs, rubs, gallops or clicks. No pedal edema. Gastrointestinal system: Abdomen is nondistended, soft and nontender. No organomegaly or masses felt. Normal bowel sounds heard. Central nervous system: Alert and oriented. No focal neurological deficits. Extremities: Symmetric 5 x 5  power. Skin: No rashes, lesions or ulcers. Psychiatry: Judgement and insight appear normal. Mood & affect appropriate.   Data Reviewed: I have personally reviewed following labs and imaging studies  CBC: Recent Labs  Lab 09/16/23 1645 09/17/23 0454 09/18/23 0438 09/19/23 0327 09/20/23 0544 09/21/23 0342  WBC 8.4 11.0* 9.9 8.6 6.1 6.2  NEUTROABS 6.7  --   --   --   --   --   HGB 5.9* 9.7* 9.4* 9.0* 9.6* 9.8*  HCT 20.8* 31.2* 29.3* 30.0* 31.8* 32.2*  MCV 94.1 87.6 88.3 90.6 92.2 90.7  PLT 386 275 240 229 248 245    Basic Metabolic Panel: Recent Labs  Lab 09/17/23 0454 09/18/23 0438 09/18/23 0820 09/19/23 0327 09/20/23 0544 09/21/23 0342  NA 127* 132*  --  129* 127* 127*  K 4.0 2.8*  --  4.0 4.9 3.6  CL 94* 96*  --  98 98 96*  CO2 23 24  --  24 22 23   GLUCOSE 112* 89  --  96 97 83  BUN 36* 28*  --  18 13 14   CREATININE 1.00 0.94  --  0.69 0.66 0.72  CALCIUM 7.6* 7.4*  --  7.4* 7.8* 7.7*  MG  --   --  2.6* 2.3  --   --     CBG: No results for input(s): "GLUCAP" in the last 168 hours.  Recent Results (from the past 240 hours)  MRSA Next Gen by PCR, Nasal     Status: None   Collection Time: 09/16/23  7:30 PM   Specimen: Nasal Mucosa; Nasal Swab  Result Value Ref Range Status   MRSA by PCR Next Gen NOT DETECTED NOT DETECTED Final    Comment: (NOTE) The GeneXpert MRSA Assay (FDA approved for NASAL specimens only), is one component of a comprehensive MRSA colonization surveillance program. It is not intended to diagnose MRSA infection nor to guide or monitor treatment for MRSA infections. Test performance is not FDA approved in patients less than 34 years old. Performed at [facility], [address] [place], [place] [address]      Radiology Studies: US Venous Img Lower Bilateral (DVT) Result Date: 09/20/2023 CLINICAL DATA:  Positive D-dimer EXAM: BILATERAL LOWER EXTREMITY VENOUS DOPPLER ULTRASOUND TECHNIQUE: Gray-scale sonography with compression, as well as  color and duplex ultrasound, were performed to evaluate the deep venous system(s) from the level of the common femoral vein through the popliteal and proximal calf veins. COMPARISON:  None Available. FINDINGS: VENOUS Normal compressibility of the common femoral, superficial femoral, and popliteal veins, as well as the visualized calf veins. Visualized portions of profunda femoral vein and great saphenous vein unremarkable. No filling defects to suggest DVT on grayscale or color Doppler imaging. Doppler waveforms show normal direction of venous flow, normal respiratory plasticity and response to augmentation. OTHER None. Limitations: none IMPRESSION: 1. Negative. Electronically Signed   By: Helyn Numbers M.D.   On: 09/20/2023 22:19   NM Pulmonary Perfusion Result Date: 09/20/2023 CLINICAL DATA:  Pulmonary embolism EXAM: NUCLEAR MEDICINE PERFUSION LUNG SCAN TECHNIQUE: Perfusion images were obtained in multiple projections after intravenous injection of radiopharmaceutical. Ventilation scans intentionally deferred if perfusion scan and chest x-ray adequate for interpretation during COVID 19 epidemic. RADIOPHARMACEUTICALS:  4.4 mCi Tc-69m MAA IV COMPARISON:  None available. Findings are correlated with chest radiograph 09/18/2018 FINDINGS: Bilateral posterobasal incomplete perfusion defects related to bilateral pleural effusions. No other segmental filling defects identified. No evidence of acute pulmonary embolism. IMPRESSION: No evidence of acute pulmonary embolism. Electronically Signed   By: Helyn Numbers M.D.   On: 09/20/2023 22:18   Scheduled Meds:  Chlorhexidine Gluconate Cloth  6 each Topical Daily   [START ON 09/22/2023] furosemide  40 mg Oral Daily   levothyroxine  75 mcg Oral QAC breakfast   metoprolol succinate  75 mg Oral BID   pantoprazole  40 mg Oral Q0600   potassium chloride  20 mEq Oral Daily   psyllium  1 packet Oral QHS   tamsulosin  0.4 mg Oral QPC supper   Continuous Infusions:    LOS: 5 days   Time spent: 55 mins  Ariyan Brisendine Laural Benes, MD How to contact the [facility] Attending or Consulting provider 7A - 7P or covering provider during after hours 7P -7A, for this patient?  Check the care team in [facility] and look for a) attending/consulting TRH provider listed and b) the [facility] team listed Log into [email or web address] to find provider on call.  Locate the [facility] provider you are looking for under [facility] and page to a number that you can be directly reached. If you still have difficulty reaching the provider, please page the [facility] (Director on Call) for the Hospitalists listed on amion for assistance.  09/21/2023, 12:02 PM

## 2023-09-21 NOTE — TOC Transition Note (Signed)
 Transition of Care [facility]) - Discharge Note   Patient Details  Name: Shelly Fields MRN: [id number] Date of Birth: [birth year]  Transition [facility] Care [facility] CM/SW Contact:  Grandville Lax, LCSWA Phone Number: 09/21/2023, 11:29 AM   Clinical Narrative:    HH arranged over weekend with [name] agency. CSW spoke to Artavia with [name] who confirms they are able to accept [facility] PT/RN referral. MD placed HH orders. Artavia following for possible D/C today and HH agency will reach out to pt in community to set up first home visit. TOC signing off.   Final next level of care: Home w Home Health Services Barriers to Discharge: Barriers Resolved   Patient Goals and CMS Choice Patient states their goals for this hospitalization and ongoing recovery are:: return home CMS Medicare.gov Compare Post Acute Care list provided to:: Patient Choice offered to / list presented to : Patient      Discharge Placement                       Discharge Plan and Services Additional resources added to the After Visit Summary for                            HH Arranged: RN, PT [facility] Agency: [name] Date [facility] Agency Contacted: 09/21/23 Time HH Agency Contacted: [phone] Representative spoke with at [facility] Agency: Renetta Carter  Social Drivers of Health (SDOH) Interventions SDOH Screenings   Food Insecurity: No Food Insecurity (09/16/2023)  Housing: Low Risk  (09/16/2023)  Transportation Needs: No Transportation Needs (09/16/2023)  Utilities: Not At Risk (09/16/2023)  Social Connections: Moderately Integrated (09/16/2023)  Tobacco Use: Low Risk  (09/16/2023)     Readmission Risk Interventions     No data to display

## 2023-09-21 NOTE — Telephone Encounter (Signed)
 Spoke to pt's son (DPR) informed him of recommendations. He voiced understanding. He states she is still in the hospital. Will hopefull be discharged tomorrow. He states he will take her Thursday or Friday to have labs done. Labs entered into Epic.

## 2023-09-22 ENCOUNTER — Telehealth (INDEPENDENT_AMBULATORY_CARE_PROVIDER_SITE_OTHER): Payer: Self-pay | Admitting: Gastroenterology

## 2023-09-22 ENCOUNTER — Telehealth (INDEPENDENT_AMBULATORY_CARE_PROVIDER_SITE_OTHER): Payer: Self-pay | Admitting: *Deleted

## 2023-09-22 DIAGNOSIS — I5033 Acute on chronic diastolic (congestive) heart failure: Secondary | ICD-10-CM

## 2023-09-22 DIAGNOSIS — D62 Acute posthemorrhagic anemia: Secondary | ICD-10-CM | POA: Diagnosis not present

## 2023-09-22 DIAGNOSIS — I4891 Unspecified atrial fibrillation: Secondary | ICD-10-CM | POA: Diagnosis not present

## 2023-09-22 LAB — BASIC METABOLIC PANEL WITH GFR
Anion gap: 3 — ABNORMAL LOW (ref 5–15)
BUN: 15 mg/dL (ref 8–23)
CO2: 25 mmol/L (ref 22–32)
Calcium: 8.6 mg/dL — ABNORMAL LOW (ref 8.9–10.3)
Chloride: 100 mmol/L (ref 98–111)
Creatinine, Ser: 0.87 mg/dL (ref 0.44–1.00)
GFR, Estimated: >60 mL/min (ref >60)
Glucose, Bld: 96 mg/dL (ref 70–99)
Potassium: 4 mmol/L (ref 3.5–5.1)
Sodium: 128 mmol/L — ABNORMAL LOW (ref 135–145)

## 2023-09-22 LAB — CBC
HCT: 33.7 % — ABNORMAL LOW (ref 36.0–46.0)
Hemoglobin: 10.2 g/dL — ABNORMAL LOW (ref 12.0–15.0)
MCH: 27.6 pg (ref 26.0–34.0)
MCHC: 30.3 g/dL (ref 30.0–36.0)
MCV: 91.3 fL (ref 80.0–100.0)
Platelets: 263 10*3/uL (ref 150–400)
RBC: 3.69 MIL/uL — ABNORMAL LOW (ref 3.87–5.11)
RDW: 15.9 % — ABNORMAL HIGH (ref 11.5–15.5)
WBC: 6.6 10*3/uL (ref 4.0–10.5)
nRBC: 0 % (ref 0.0–0.2)

## 2023-09-22 LAB — MAGNESIUM: Magnesium: 1.8 mg/dL (ref 1.7–2.4)

## 2023-09-22 MED ORDER — FUROSEMIDE 40 MG PO TABS: 40.0000 mg | ORAL_TABLET | Freq: Every day | ORAL | 1 refills | Status: DC

## 2023-09-22 NOTE — Telephone Encounter (Signed)
-----   Message from Hargis Lias sent at 09/17/2023  7:31 PM EDT ----- Regarding: appt Hi [name]   Can you refer this patient to colorectal surgery, Anal lesion/Grade 4 hemorrhoids  Hi Amalia Badder,  Can you please schedule a follow up appointment for this patient in 2-3 weeks with me or any of the APPs?  Thanks,  Muhammad Faizan Ahmed , MD Gastroenterology and Hepatology [facility]astroenterology

## 2023-09-22 NOTE — Plan of Care (Signed)
   Problem: Health Behavior/Discharge Planning: Goal: Ability to manage health-related needs will improve Outcome: Progressing   Problem: Clinical Measurements: Goal: Ability to maintain clinical measurements within normal limits will improve Outcome: Progressing

## 2023-09-22 NOTE — Telephone Encounter (Signed)
-----   Message from Hargis Lias sent at 09/17/2023  7:31 PM EDT ----- Regarding: appt Hi Derra Shartzer   Can you refer this patient to colorectal surgery, Anal lesion/Grade 4 hemorrhoids  Hi Amalia Badder,  Can you please schedule a follow up appointment for this patient in 2-3 weeks with me or any of the APPs?  Thanks,  Muhammad Faizan Ahmed , MD Gastroenterology and Hepatology [facility] Gastroenterology

## 2023-09-22 NOTE — Telephone Encounter (Signed)
 Referral sent, they will contact patient with apt

## 2023-09-22 NOTE — Progress Notes (Signed)
 Progress Note  Patient Name: Shelly Fields Date of Encounter: 09/22/2023  Primary Cardiologist: Nona Dell, MD  Subjective   No symptoms.  Telemetry reviewed, in atrial fibrillation, HR 60-70s.  Inpatient Medications    Scheduled Meds:  furosemide  40 mg Oral Daily   levothyroxine  75 mcg Oral QAC breakfast   metoprolol succinate  75 mg Oral BID   pantoprazole  40 mg Oral Q0600   potassium chloride  20 mEq Oral Daily   psyllium  1 packet Oral QHS   tamsulosin  0.4 mg Oral QPC supper   Continuous Infusions:  PRN Meds: acetaminophen **OR** acetaminophen, ALPRAZolam, levalbuterol, Muscle Rub, ondansetron **OR** ondansetron (ZOFRAN) IV   Vital Signs    Vitals:   09/21/23 2332 09/22/23 0258 09/22/23 0431 09/22/23 0618  BP:  126/60    Pulse:  70    Resp: 16  18 (!) 22  Temp:  98 F (36.7 C)    TempSrc:  Oral    SpO2:  95%    Weight:      Height:        Intake/Output Summary (Last 24 hours) at 09/22/2023 1025 Last data filed at 09/22/2023 0814 Gross per 24 hour  Intake 820 ml  Output --  Net 820 ml   Filed Weights   09/16/23 1617 09/16/23 2142  Weight: 47 kg 50 kg    Telemetry     Personally reviewed.  Atrial fibrillation, HR 60-70s.  ECG   Not performed today   Physical Exam   GEN: No acute distress.   Neck: No JVD. Cardiac: Irregular rate and rhythm, no murmur, rub, or gallop.  Respiratory: Nonlabored. Clear to auscultation bilaterally. GI: Soft, nontender, bowel sounds present. MS: No edema; No deformity. Neuro:  Nonfocal. Psych: Alert and oriented x 3. Normal affect.  Labs    Chemistry Recent Labs  Lab 09/16/23 1645 09/17/23 0454 09/20/23 0544 09/21/23 0342 09/22/23 0255  NA 124*   < > 127* 127* 128*  K 4.3   < > 4.9 3.6 4.0  CL 89*   < > 98 96* 100  CO2 22   < > 22 23 25   GLUCOSE 137*   < > 97 83 96  BUN 41*   < > 13 14 15   CREATININE 1.21*   < > 0.66 0.72 0.87  CALCIUM 8.1*   < > 7.8* 7.7* 8.6*  PROT 6.4*  --   --   --    --   ALBUMIN 3.1*  --   --   --   --   AST 25  --   --   --   --   ALT 16  --   --   --   --   ALKPHOS 64  --   --   --   --   BILITOT 0.7  --   --   --   --   GFRNONAA 44*   < > >60 >60 >60  ANIONGAP 13   < > 7 8 3*   < > = values in this interval not displayed.     Hematology Recent Labs  Lab 09/20/23 0544 09/21/23 0342 09/22/23 0255  WBC 6.1 6.2 6.6  RBC 3.45* 3.55* 3.69*  HGB 9.6* 9.8* 10.2*  HCT 31.8* 32.2* 33.7*  MCV 92.2 90.7 91.3  MCH 27.8 27.6 27.6  MCHC 30.2 30.4 30.3  RDW 16.4* 15.9* 15.9*  PLT 248 245 263    Cardiac Enzymes  Recent Labs  Lab 09/16/23 1645 09/16/23 1845  TROPONINIHS 22* 19*    BNP Recent Labs  Lab 09/16/23 1645  BNP 636.0*     DDimer Recent Labs  Lab 09/20/23 1139  DDIMER 1.60*     Radiology    US  Venous Img Lower Bilateral (DVT) Result Date: 09/20/2023 CLINICAL DATA:  Positive D-dimer EXAM: BILATERAL LOWER EXTREMITY VENOUS DOPPLER ULTRASOUND TECHNIQUE: Gray-scale sonography with compression, as well as color and duplex ultrasound, were performed to evaluate the deep venous system(s) from the level of the common femoral vein through the popliteal and proximal calf veins. COMPARISON:  None Available. FINDINGS: VENOUS Normal compressibility of the common femoral, superficial femoral, and popliteal veins, as well as the visualized calf veins. Visualized portions of profunda femoral vein and great saphenous vein unremarkable. No filling defects to suggest DVT on grayscale or color Doppler imaging. Doppler waveforms show normal direction of venous flow, normal respiratory plasticity and response to augmentation. OTHER None. Limitations: none IMPRESSION: 1. Negative. Electronically Signed   By: Worthy Heads M.D.   On: 09/20/2023 22:19   NM Pulmonary Perfusion Result Date: 09/20/2023 CLINICAL DATA:  Pulmonary embolism EXAM: NUCLEAR MEDICINE PERFUSION LUNG SCAN TECHNIQUE: Perfusion images were obtained in multiple projections after  intravenous injection of radiopharmaceutical. Ventilation scans intentionally deferred if perfusion scan and chest x-ray adequate for interpretation during COVID 19 epidemic. RADIOPHARMACEUTICALS:  4.4 mCi Tc-32m MAA IV COMPARISON:  None available. Findings are correlated with chest radiograph 09/18/2018 FINDINGS: Bilateral posterobasal incomplete perfusion defects related to bilateral pleural effusions. No other segmental filling defects identified. No evidence of acute pulmonary embolism. IMPRESSION: No evidence of acute pulmonary embolism. Electronically Signed   By: Worthy Heads M.D.   On: 09/20/2023 22:18    Assessment & Plan    Acute on chronic diastolic heart failure and RV heart failure, compensated: SOB and leg swelling improved.  She has small bilateral pleural effusions on imaging.  IV Lasix switched to p.o. Lasix 40 mg once daily today.  Reds vest 15% yesterday.  Permanent atrial fibrillation with RVR: Rates adequately controlled.  Telemetry reviewed, in atrial fibrillation, HR 60 to 70s.  She received digoxin loading dose this hospitalization.  No need of maintenance dose.  Continue metoprolol succinate of send 5 mg twice daily.  Xarelto discontinued this admission due to acute blood loss anemia.  Will avoid systemic AC.  Not a candidate for watchman implantation due to small body habitus.  Severe symptomatic anemia from acute blood loss anemia: No macroscopic GI bleed according to the patient.  Unremarkable GI workup in 2024 except for H. pylori gastritis and hemorrhoids.  GI recommended outpatient colorectal surgeon follow-up to rule out ano rectal lesion.   [facility] will sign off.   Medication Recommendations: Continue p.o. Lasix 40 mg once daily (hold home torsemide), okay to resume Farxiga 10 mg once daily, continue metoprolol succinate 75 mg twice daily.  Discontinue Xarelto upon discharge. Other recommendations (labs, testing, etc): None Follow up as an outpatient: Keep  appointment with Dr. Londa Rival in June 2025.    Signed, Lasalle Pointer, MD  09/22/2023, 10:25 AM

## 2023-09-22 NOTE — Progress Notes (Signed)
 Pt was able to walk 1 assist to the restroom with mostly steady gait. She brushed her teeth on her own. She remained in AFIB. Her heart rayed bounced around. NO acute event overngiht.

## 2023-09-22 NOTE — Discharge Summary (Signed)
 Physician Discharge Summary   Patient: Shelly Fields MRN: [id number] DOB: 01-Aug-1934  Admit date:     09/16/2023  Discharge date: 09/22/23  Discharge Physician: Myrtie Atkinson Kandiss Ihrig   PCP: Orlena Bitters, MD   Recommendations at discharge:   Please follow up with primary care provider within 1-2 weeks  Please repeat BMP and CBC in one week     Hospital Course: 89 y.o. female with history of permanent Afib, pulmonology, HFpEF cor pulmonale, unable bronchiectasis, hypothyroidism and HTN presented to ED secondary to generalized weakness, fatigue and lethargy, denies chest pain, fall, fever, chills, patient on Xarelto for A-fib, patient with known history of hemorrhoids, diverticulosis, gastritis with H. pylori during recent endoscopy and colonoscopy on October 2024, patient herself denies any coffee-ground emesis, melena or bright red blood per rectum, patient recently had to hold her Demadex for squamous cell resection surgery last Monday, she was told by her PCP to resume with increased doses yesterday as she is having significant edema and fluid retention. - In ED her hemoglobin came back low at 5.9, she was Hemoccult positive, so she received reversal agent Andexxa for Xarelto, ordered 3 units PRBC, she was dyspneic, hypoxic, and in a frail weakened state,  chest x-ray significant for pleural effusion, opacity and volume overload, started on IV Lasix and antibiotics, [facility] consulted to admit.  Assessment and Plan:  Acute blood loss anemia Symptomatic anemia GI bleed -Patient presented with hemoglobin of 5.9, she was very symptomatic, she received 3 units PRBC transfusion. -Xarelto discontinued, she received Andexxa for reversal -Endoscopy, colonoscopy last October 2024, significant for gastritis, H. pylori, diverticulosis with hemorrhoids -s/p transfused 3 units PRBC, monitored CBC closely. -advance diet as tolerated  -GI consult appreciated, for now follow Hgb, recently had  procedures, may need outpatient hematology consult.   -Continue with Protonix for GI protection -would not restart anticoagulation given high risk for recurrent severe bleeding with limit treatment options, discussed with patient and son and they verbalized understanding  -GI team referring patient to outpatient surgery for evaluation of treatment for hemorrhoids   Acute respiratory failure with hypoxia Duration of pneumonia/right lower lobe pneumonia Acute on chronic diastolic CHF Right heart failure/cor pulmonale - Patient off nonrebreather with much improvement - the underlying problem was urinary retention she had more than 500 cc in bladder - placed foley, added flomax with supper, foley subsequently removed and has been voiding - Chest x-ray significant for volume overload, as well having elevated BNP, she was started on IV diuresis, repeat 2D echo.  Holding home dapagliflozin for now - Right lung base opacity, she had an episode of vomiting Tuesday morning, was started on IV Unasyn for presumed aspiration pneumonia and completed course.  - Blood pressures stabilizing with diuresis - As discussed with son, patient is DNR, no heroics, but okay to continue above measures like pressors via peripheral and BiPAP if needed. - cardiology ordered D dimer which was elevated and requested V/Q scan to rule out PE- this study was negative as well as dopplers of legs negative for DVT.  -now stable on RA   Acute urinary retention  - foley cath placed with good urine output - foley removed 4/12, she has been voiding and we are monitoring closely  - added flomax on 4/11   Essential hypertension.  - Continue with home metoprolol, now on succinate 75 mg BID    Atrial fibrillation with RVR  - HR up to 130-140s>>improved at time of dc - further review  of home med rec and she has tolerated doses of metoprolol XL up to 75 mg BID - Discontinued Xarelto due to above, would not restart due to severe  bleeding complication - cardiology team managing metoprolol    Hypothyroidism -Continue with home levothyroxine - TSH and free T4 elevated but difficult to interpret in setting of critical illness - will need to have rechecked outpatient when not in ICU setting   Hyponatremia - Due to volume overload, should improve with diuresis - lasix increased to BID by cardiology team but now resuming oral lasix 40 mg daily    Hypokalemia - repleted      Squamous cell carcinoma status post recent resection on Monday -Wound looks stable    Consultants: cardiology Procedures performed: none  Disposition: Home Diet recommendation:  Cardiac diet DISCHARGE MEDICATION: Allergies as of 09/22/2023   No Known Allergies      Medication List     STOP taking these medications    acetaminophen 500 MG tablet Commonly known as: TYLENOL   Rivaroxaban 15 MG Tabs tablet Commonly known as: Xarelto   torsemide 20 MG tablet Commonly known as: DEMADEX       TAKE these medications    ALPRAZolam 0.25 MG tablet Commonly known as: XANAX Take 0.125-0.25 mg by mouth daily as needed for anxiety or sleep.   dapagliflozin propanediol 10 MG Tabs tablet Commonly known as: Farxiga Take 1 tablet (10 mg total) by mouth daily before breakfast.   denosumab 60 MG/ML Sosy injection Commonly known as: PROLIA Inject 60 mg into the skin every 6 (six) months.   docusate sodium 100 MG capsule Commonly known as: Colace Take 1 capsule (100 mg total) by mouth 2 (two) times daily.   ferrous gluconate 324 MG tablet Commonly known as: FERGON Take 1 tablet (324 mg total) by mouth every other day.   furosemide 40 MG tablet Commonly known as: LASIX Take 1 tablet (40 mg total) by mouth daily. Start taking on: September 23, 2023   levothyroxine 75 MCG tablet Commonly known as: SYNTHROID Take 75 mcg by mouth daily before breakfast.   magnesium hydroxide 400 MG/5ML suspension Commonly known as: MILK OF  MAGNESIA Take 15 mLs by mouth daily as needed for mild constipation.   metoprolol succinate 50 MG 24 hr tablet Commonly known as: TOPROL-XL TAKE 1 AND 1/2 TABLETS BY MOUTH  TWICE DAILY   oxyCODONE 5 MG immediate release tablet Commonly known as: Roxicodone Take 1 tablet (5 mg total) by mouth every 6 (six) hours as needed.   potassium chloride SA 20 MEQ tablet Commonly known as: KLOR-CON M Take 1 tablet (20 mEq total) by mouth daily.   psyllium 58.6 % packet Commonly known as: METAMUCIL Take 1 packet by mouth 2 (two) times daily. What changed: when to take this   THERATEARS OP Place 1 drop into both eyes daily.        Discharge Exam: Filed Weights   09/16/23 1617 09/16/23 2142  Weight: 47 kg 50 kg   HEENT:  [place]/AT, No thrush, no icterus CV:  RRR, no rub, no S3, no S4 Lung:  CTA, no wheeze, no rhonchi Abd:  soft/+BS, NT Ext:  No edema, no lymphangitis, no synovitis, no rash   Condition at discharge: stable  The results of significant diagnostics from this hospitalization (including imaging, microbiology, ancillary and laboratory) are listed below for reference.   Imaging Studies: US Venous Img Lower Bilateral (DVT) Result Date: 09/20/2023 CLINICAL DATA:  Positive D-dimer EXAM:  BILATERAL LOWER EXTREMITY VENOUS DOPPLER ULTRASOUND TECHNIQUE: Gray-scale sonography with compression, as well as color and duplex ultrasound, were performed to evaluate the deep venous system(s) from the level of the common femoral vein through the popliteal and proximal calf veins. COMPARISON:  None Available. FINDINGS: VENOUS Normal compressibility of the common femoral, superficial femoral, and popliteal veins, as well as the visualized calf veins. Visualized portions of profunda femoral vein and great saphenous vein unremarkable. No filling defects to suggest DVT on grayscale or color Doppler imaging. Doppler waveforms show normal direction of venous flow, normal respiratory plasticity and  response to augmentation. OTHER None. Limitations: none IMPRESSION: 1. Negative. Electronically Signed   By: Worthy Heads M.D.   On: 09/20/2023 22:19   NM Pulmonary Perfusion Result Date: 09/20/2023 CLINICAL DATA:  Pulmonary embolism EXAM: NUCLEAR MEDICINE PERFUSION LUNG SCAN TECHNIQUE: Perfusion images were obtained in multiple projections after intravenous injection of radiopharmaceutical. Ventilation scans intentionally deferred if perfusion scan and chest x-ray adequate for interpretation during COVID 19 epidemic. RADIOPHARMACEUTICALS:  4.4 mCi Tc-56m MAA IV COMPARISON:  None available. Findings are correlated with chest radiograph 09/18/2018 FINDINGS: Bilateral posterobasal incomplete perfusion defects related to bilateral pleural effusions. No other segmental filling defects identified. No evidence of acute pulmonary embolism. IMPRESSION: No evidence of acute pulmonary embolism. Electronically Signed   By: Worthy Heads M.D.   On: 09/20/2023 22:18   DG CHEST PORT 1 VIEW Result Date: 09/18/2023 CLINICAL DATA:  [id number] Aspiration pneumonia (HCC) [id number] EXAM: PORTABLE CHEST - 1 VIEW COMPARISON:  09/16/2023 FINDINGS: Pleural effusions right greater than left with opacities in both lung bases, stable. Heart size and mediastinal contours are within normal limits. Aortic Atherosclerosis (ICD10-170.0). Post lower thoracic cement augmentation. IMPRESSION: Bilateral pleural effusions and basilar opacities. Electronically Signed   By: Nicoletta Barrier M.D.   On: 09/18/2023 08:53   ECHOCARDIOGRAM COMPLETE Result Date: 09/17/2023    ECHOCARDIOGRAM REPORT   Patient Name:   Shelly Fields [facility] Date of Exam: 09/17/2023 Medical Rec #:  [id number]      Height:       62.0 in Accession #:    [id number]     Weight:       110.2 lb Date of Birth:  12-02-1934     BSA:          1.484 m Patient Age:    88 years       BP:           149/73 mmHg Patient Gender: F              HR:           85 bpm. Exam Location:  [facility] Procedure: 2D  Echo, Cardiac Doppler and Color Doppler (Both Spectral and Color            Flow Doppler were utilized during procedure). Indications:    CHF-Acute Diastolic I50.31  History:        Patient has prior history of Echocardiogram examinations, most                 recent 02/25/2022. Arrythmias:Atrial Fibrillation,                 Signs/Symptoms:Syncope; Risk Factors:Hypertension.  Sonographer:    Denese Finn RCS Referring Phys: [phone] DAWOOD S ELGER[place] IMPRESSIONS  1. Left ventricular ejection fraction, by estimation, is 70 to 75%. The left ventricle has hyperdynamic function. The left ventricle has no regional wall motion abnormalities. There is mild concentric left ventricular  hypertrophy. Left ventricular diastolic parameters are indeterminate. There is the interventricular septum is flattened in systole and diastole, consistent with right ventricular pressure and volume overload.  2. Right ventricular systolic function is mildly reduced. The right ventricular size is severely enlarged. There is normal pulmonary artery systolic pressure. The estimated right ventricular systolic pressure is 30.7 mmHg.  3. Left atrial size was moderately dilated.  4. Right atrial size was severely dilated.  5. Left pleural effusion present. There is a small to moderate pericardial effusion. The pericardial effusion is posterior to the left ventricle.  6. The mitral valve is degenerative. Mild to moderate mitral valve regurgitation.  7. The tricuspid valve is degenerative. Tricuspid valve regurgitation is moderate to severe.  8. The aortic valve is tricuspid. Aortic valve regurgitation is mild. No aortic stenosis is present.  9. The inferior vena cava is dilated in size with <50% respiratory variability, suggesting right atrial pressure of 15 mmHg. Comparison(s): Prior images reviewed side by side. LVEF vigorous at 70-75%. Severely dilated RV and RA with mild RV dysfunction and normal estimated RVSP. FINDINGS  Left Ventricle: Left  ventricular ejection fraction, by estimation, is 70 to 75%. The left ventricle has hyperdynamic function. The left ventricle has no regional wall motion abnormalities. The left ventricular internal cavity size was normal in size. There is mild concentric left ventricular hypertrophy. The interventricular septum is flattened in systole and diastole, consistent with right ventricular pressure and volume overload. Left ventricular diastolic function could not be evaluated due to atrial fibrillation. Left ventricular diastolic parameters are indeterminate. Right Ventricle: The right ventricular size is severely enlarged. Right vetricular wall thickness was not well visualized. Right ventricular systolic function is mildly reduced. There is normal pulmonary artery systolic pressure. The tricuspid regurgitant velocity is 1.98 m/s, and with an assumed right atrial pressure of 15 mmHg, the estimated right ventricular systolic pressure is 30.7 mmHg. Left Atrium: Left atrial size was moderately dilated. Right Atrium: Right atrial size was severely dilated. Pericardium: Left pleural effusion present. A small pericardial effusion is present. The pericardial effusion is posterior to the left ventricle. There is no evidence of cardiac tamponade. Mitral Valve: The mitral valve is degenerative in appearance. There is mild thickening of the mitral valve leaflet(s). Mild to moderate mitral valve regurgitation, with posteriorly-directed jet. Tricuspid Valve: The tricuspid valve is degenerative in appearance. Tricuspid valve regurgitation is moderate to severe. Aortic Valve: The aortic valve is tricuspid. There is mild aortic valve annular calcification. Aortic valve regurgitation is mild. No aortic stenosis is present. Aortic valve mean gradient measures 2.0 mmHg. Aortic valve peak gradient measures 4.0 mmHg. Aortic valve area, by VTI measures 2.19 cm. Pulmonic Valve: The pulmonic valve was grossly normal. Pulmonic valve  regurgitation is trivial. Aorta: The aortic root is normal in size and structure. Venous: The inferior vena cava is dilated in size with less than 50% respiratory variability, suggesting right atrial pressure of 15 mmHg. IAS/Shunts: No atrial level shunt detected by color flow Doppler. Additional Comments: 3D was performed not requiring image post processing on an independent workstation and was indeterminate.  LEFT VENTRICLE PLAX 2D LVIDd:         4.40 cm   Diastology LVIDs:         2.30 cm   LV e' medial:    8.38 cm/s LV PW:         1.10 cm   LV E/e' medial:  14.8 LV IVS:        1.00 cm  LV e' lateral:   11.30 cm/s LVOT diam:     1.90 cm   LV E/e' lateral: 11.0 LV SV:         43 LV SV Index:   29 LVOT Area:     2.84 cm  RIGHT VENTRICLE RV S prime:     11.90 cm/s TAPSE (M-mode): 1.8 cm LEFT ATRIUM              Index        RIGHT ATRIUM           Index LA diam:        3.60 cm  2.43 cm/m   RA Area:     51.20 cm LA Vol (A2C):   108.0 ml 72.77 ml/m  RA Volume:   241.00 ml 162.39 ml/m LA Vol (A4C):   65.5 ml  44.14 ml/m LA Biplane Vol: 84.7 ml  57.07 ml/m  AORTIC VALVE AV Area (Vmax):    1.98 cm AV Area (Vmean):   2.09 cm AV Area (VTI):     2.19 cm AV Vmax:           99.60 cm/s AV Vmean:          65.100 cm/s AV VTI:            0.198 m AV Peak Grad:      4.0 mmHg AV Mean Grad:      2.0 mmHg LVOT Vmax:         69.50 cm/s LVOT Vmean:        48.100 cm/s LVOT VTI:          0.153 m LVOT/AV VTI ratio: 0.77  AORTA Ao Root diam: 3.60 cm MITRAL VALVE                TRICUSPID VALVE MV Area (PHT): 5.54 cm     TR Peak grad:   15.7 mmHg MV Decel Time: 137 msec     TR Vmax:        198.00 cm/s MR Peak grad: 102.4 mmHg MR Mean grad: 67.0 mmHg     SHUNTS MR Vmax:      506.00 cm/s   Systemic VTI:  0.15 m MR Vmean:     387.0 cm/s    Systemic Diam: 1.90 cm MV E velocity: 124.00 cm/s MV A velocity: 30.10 cm/s MV E/A ratio:  4.12 Teddie Favre MD Electronically signed by Teddie Favre MD Signature Date/Time: 09/17/2023/11:07:55  AM    Final    DG Chest 2 View Result Date: 09/16/2023 CLINICAL DATA:  Shortness of breath. EXAM: CHEST - 2 VIEW COMPARISON:  November 23, 2022. FINDINGS: Stable cardiomediastinal silhouette. Status post kyphoplasty of mid thoracic vertebral body. Mild right pleural effusion is noted with associated right basilar atelectasis or infiltrate. Left lung is unremarkable. IMPRESSION: Mild right pleural effusion is noted with associated right basilar atelectasis or infiltrate. Electronically Signed   By: Rosalene Colon M.D.   On: 09/16/2023 18:33    Microbiology: Results for orders placed or performed during the hospital encounter of 09/16/23  MRSA Next Gen by PCR, Nasal     Status: None   Collection Time: 09/16/23  7:30 PM   Specimen: Nasal Mucosa; Nasal Swab  Result Value Ref Range Status   MRSA by PCR Next Gen NOT DETECTED NOT DETECTED Final    Comment: (NOTE) The GeneXpert MRSA Assay (FDA approved for NASAL specimens only), is one component of a comprehensive MRSA colonization surveillance program. It  is not intended to diagnose MRSA infection nor to guide or monitor treatment for MRSA infections. Test performance is not FDA approved in patients less than 15 years old. Performed at [facility], [address], [place], [place] [address]     Labs: CBC: Recent Labs  Lab 09/16/23 1645 09/17/23 0454 09/18/23 0438 09/19/23 0327 09/20/23 0544 09/21/23 0342 09/22/23 0255  WBC 8.4   < > 9.9 8.6 6.1 6.2 6.6  NEUTROABS 6.7  --   --   --   --   --   --   HGB 5.9*   < > 9.4* 9.0* 9.6* 9.8* 10.2*  HCT 20.8*   < > 29.3* 30.0* 31.8* 32.2* 33.7*  MCV 94.1   < > 88.3 90.6 92.2 90.7 91.3  PLT 386   < > 240 229 248 245 263   < > = values in this interval not displayed.   Basic Metabolic Panel: Recent Labs  Lab 09/18/23 0438 09/18/23 0820 09/19/23 0327 09/20/23 0544 09/21/23 0342 09/22/23 0255  NA 132*  --  129* 127* 127* 128*  K 2.8*  --  4.0 4.9 3.6 4.0  CL 96*  --  98 98 96* 100  CO2  24  --  24 22 23 25   GLUCOSE 89  --  96 97 83 96  BUN 28*  --  18 13 14 15   CREATININE 0.94  --  0.69 0.66 0.72 0.87  CALCIUM 7.4*  --  7.4* 7.8* 7.7* 8.6*  MG  --  2.6* 2.3  --   --  1.8   Liver Function Tests: Recent Labs  Lab 09/16/23 1645  AST 25  ALT 16  ALKPHOS 64  BILITOT 0.7  PROT 6.4*  ALBUMIN 3.1*   CBG: No results for input(s): "GLUCAP" in the last 168 hours.  Discharge time spent: greater than 30 minutes.  Signed: Demaris Fillers, MD [facility] 09/22/2023

## 2023-09-22 NOTE — Telephone Encounter (Signed)
OV made °

## 2023-09-22 NOTE — Progress Notes (Signed)
 Mobility Specialist Progress Note:    09/22/23 1140  Mobility  Activity Ambulated with assistance in hallway;Ambulated with assistance to bathroom  Level of Assistance Modified independent, requires aide device or extra time  Assistive Device Front wheel walker  Distance Ambulated (ft) 200 ft  Range of Motion/Exercises Active;All extremities  Activity Response Tolerated well  Mobility Referral Yes  Mobility visit 1 Mobility  Mobility Specialist Start Time (ACUTE ONLY) 1120  Mobility Specialist Stop Time (ACUTE ONLY) 1140  Mobility Specialist Time Calculation (min) (ACUTE ONLY) 20 min   Pt received in bed, granddaughter at bedside. Eager for mobility, ModI to stand and ambulate with RW. Tolerated well, HR 70-95 bpm throughout session. Returned pt supine, all needs met.  Glinda Lapping Mobility Specialist Please contact via Special educational needs teacher or  Rehab office at [phone]

## 2023-09-23 DIAGNOSIS — Z556 Problems related to health literacy: Secondary | ICD-10-CM | POA: Diagnosis not present

## 2023-09-23 DIAGNOSIS — M810 Age-related osteoporosis without current pathological fracture: Secondary | ICD-10-CM | POA: Diagnosis not present

## 2023-09-23 DIAGNOSIS — I272 Pulmonary hypertension, unspecified: Secondary | ICD-10-CM | POA: Diagnosis not present

## 2023-09-23 DIAGNOSIS — I2781 Cor pulmonale (chronic): Secondary | ICD-10-CM | POA: Diagnosis not present

## 2023-09-23 DIAGNOSIS — I50810 Right heart failure, unspecified: Secondary | ICD-10-CM | POA: Diagnosis not present

## 2023-09-23 DIAGNOSIS — K643 Fourth degree hemorrhoids: Secondary | ICD-10-CM | POA: Diagnosis not present

## 2023-09-23 DIAGNOSIS — R339 Retention of urine, unspecified: Secondary | ICD-10-CM | POA: Diagnosis not present

## 2023-09-23 DIAGNOSIS — J470 Bronchiectasis with acute lower respiratory infection: Secondary | ICD-10-CM | POA: Diagnosis not present

## 2023-09-23 DIAGNOSIS — I4821 Permanent atrial fibrillation: Secondary | ICD-10-CM | POA: Diagnosis not present

## 2023-09-23 DIAGNOSIS — E039 Hypothyroidism, unspecified: Secondary | ICD-10-CM | POA: Diagnosis not present

## 2023-09-23 DIAGNOSIS — I5033 Acute on chronic diastolic (congestive) heart failure: Secondary | ICD-10-CM | POA: Diagnosis not present

## 2023-09-23 DIAGNOSIS — D509 Iron deficiency anemia, unspecified: Secondary | ICD-10-CM | POA: Diagnosis not present

## 2023-09-23 DIAGNOSIS — C44722 Squamous cell carcinoma of skin of right lower limb, including hip: Secondary | ICD-10-CM | POA: Diagnosis not present

## 2023-09-23 DIAGNOSIS — D62 Acute posthemorrhagic anemia: Secondary | ICD-10-CM | POA: Diagnosis not present

## 2023-09-23 DIAGNOSIS — E871 Hypo-osmolality and hyponatremia: Secondary | ICD-10-CM | POA: Diagnosis not present

## 2023-09-23 DIAGNOSIS — K2971 Gastritis, unspecified, with bleeding: Secondary | ICD-10-CM | POA: Diagnosis not present

## 2023-09-23 DIAGNOSIS — K2991 Gastroduodenitis, unspecified, with bleeding: Secondary | ICD-10-CM | POA: Diagnosis not present

## 2023-09-23 DIAGNOSIS — J9601 Acute respiratory failure with hypoxia: Secondary | ICD-10-CM | POA: Diagnosis not present

## 2023-09-23 DIAGNOSIS — I110 Hypertensive heart disease with heart failure: Secondary | ICD-10-CM | POA: Diagnosis not present

## 2023-09-23 DIAGNOSIS — N179 Acute kidney failure, unspecified: Secondary | ICD-10-CM | POA: Diagnosis not present

## 2023-09-24 ENCOUNTER — Telehealth: Payer: Self-pay | Admitting: Cardiology

## 2023-09-24 DIAGNOSIS — R339 Retention of urine, unspecified: Secondary | ICD-10-CM | POA: Diagnosis not present

## 2023-09-24 DIAGNOSIS — I2781 Cor pulmonale (chronic): Secondary | ICD-10-CM | POA: Diagnosis not present

## 2023-09-24 DIAGNOSIS — M810 Age-related osteoporosis without current pathological fracture: Secondary | ICD-10-CM | POA: Diagnosis not present

## 2023-09-24 DIAGNOSIS — Z556 Problems related to health literacy: Secondary | ICD-10-CM | POA: Diagnosis not present

## 2023-09-24 DIAGNOSIS — I4821 Permanent atrial fibrillation: Secondary | ICD-10-CM | POA: Diagnosis not present

## 2023-09-24 DIAGNOSIS — I272 Pulmonary hypertension, unspecified: Secondary | ICD-10-CM | POA: Diagnosis not present

## 2023-09-24 DIAGNOSIS — D509 Iron deficiency anemia, unspecified: Secondary | ICD-10-CM | POA: Diagnosis not present

## 2023-09-24 DIAGNOSIS — I5033 Acute on chronic diastolic (congestive) heart failure: Secondary | ICD-10-CM | POA: Diagnosis not present

## 2023-09-24 DIAGNOSIS — K2971 Gastritis, unspecified, with bleeding: Secondary | ICD-10-CM | POA: Diagnosis not present

## 2023-09-24 DIAGNOSIS — J470 Bronchiectasis with acute lower respiratory infection: Secondary | ICD-10-CM | POA: Diagnosis not present

## 2023-09-24 DIAGNOSIS — E871 Hypo-osmolality and hyponatremia: Secondary | ICD-10-CM | POA: Diagnosis not present

## 2023-09-24 DIAGNOSIS — D62 Acute posthemorrhagic anemia: Secondary | ICD-10-CM | POA: Diagnosis not present

## 2023-09-24 DIAGNOSIS — I50810 Right heart failure, unspecified: Secondary | ICD-10-CM | POA: Diagnosis not present

## 2023-09-24 DIAGNOSIS — E039 Hypothyroidism, unspecified: Secondary | ICD-10-CM | POA: Diagnosis not present

## 2023-09-24 DIAGNOSIS — I110 Hypertensive heart disease with heart failure: Secondary | ICD-10-CM | POA: Diagnosis not present

## 2023-09-24 DIAGNOSIS — C44722 Squamous cell carcinoma of skin of right lower limb, including hip: Secondary | ICD-10-CM | POA: Diagnosis not present

## 2023-09-24 DIAGNOSIS — K2991 Gastroduodenitis, unspecified, with bleeding: Secondary | ICD-10-CM | POA: Diagnosis not present

## 2023-09-24 DIAGNOSIS — J9601 Acute respiratory failure with hypoxia: Secondary | ICD-10-CM | POA: Diagnosis not present

## 2023-09-24 DIAGNOSIS — N179 Acute kidney failure, unspecified: Secondary | ICD-10-CM | POA: Diagnosis not present

## 2023-09-24 DIAGNOSIS — K643 Fourth degree hemorrhoids: Secondary | ICD-10-CM | POA: Diagnosis not present

## 2023-09-24 MED ORDER — FUROSEMIDE 40 MG PO TABS: ORAL_TABLET | ORAL | 3 refills | Status: DC

## 2023-09-24 NOTE — Telephone Encounter (Signed)
 Shelly Fields was recently discharged from the hospital her weight has went up 2lbs overnight.. Just a little leg edema but, nothing like it was when she was admitted.  please advise.

## 2023-09-24 NOTE — Telephone Encounter (Signed)
 Please Call [place] [facility]. DPR on file. [phone]

## 2023-09-24 NOTE — Telephone Encounter (Signed)
  Shelly Fields is calling back and she gave pt's pharmacy [id number] - [place], [place] - [address]

## 2023-09-24 NOTE — Telephone Encounter (Signed)
 Returned call to [place]. Informed that pt is to take additional 40 mg of Lasix  on today then starting tomorrow pt will switch to 40 mg alternating with 80 mg daily. Avanell Bob voiced understanding.

## 2023-09-24 NOTE — Telephone Encounter (Signed)
 Spoke with Avanell Bob who states that pt has gained 2 lbs over night and has some swelling in her ankles. Avanell Bob denies that pt is having chest pain or SOB. Avanell Bob confirms that pt is taking Lasix  40 mg as prescribed. Please advise. Will route to Dr. Londa Rival and DOD Dr. Mallipeddi.

## 2023-09-25 LAB — CBC WITH DIFFERENTIAL/PLATELET
Basophils Absolute: 0.1 10*3/uL (ref 0.0–0.2)
Basos: 1 %
EOS (ABSOLUTE): 0.2 10*3/uL (ref 0.0–0.4)
Eos: 3 %
Hematocrit: 35.6 % (ref 34.0–46.6)
Hemoglobin: 11.4 g/dL (ref 11.1–15.9)
Immature Grans (Abs): 0.1 10*3/uL (ref 0.0–0.1)
Immature Granulocytes: 1 %
Lymphocytes Absolute: 0.7 10*3/uL (ref 0.7–3.1)
Lymphs: 11 %
MCH: 28 pg (ref 26.6–33.0)
MCHC: 32 g/dL (ref 31.5–35.7)
MCV: 88 fL (ref 79–97)
Monocytes Absolute: 1 10*3/uL — ABNORMAL HIGH (ref 0.1–0.9)
Monocytes: 16 %
Neutrophils Absolute: 4.2 10*3/uL (ref 1.4–7.0)
Neutrophils: 68 %
Platelets: 248 10*3/uL (ref 150–450)
RBC: 4.07 x10E6/uL (ref 3.77–5.28)
RDW: 14.2 % (ref 11.7–15.4)
WBC: 6.1 10*3/uL (ref 3.4–10.8)

## 2023-09-28 ENCOUNTER — Telehealth: Payer: Self-pay | Admitting: *Deleted

## 2023-09-28 DIAGNOSIS — M810 Age-related osteoporosis without current pathological fracture: Secondary | ICD-10-CM | POA: Diagnosis not present

## 2023-09-28 NOTE — Telephone Encounter (Signed)
 Surgical Date: 09/13/2023 Procedure: Excision RLE SCC  Received call from patient son, Joe [phone]~ telephone.   Reports that patient will not be able to come in on Wednesday, 09/29/2023 for post op appointment and suture removal.  Advised to come to office on Friday, 10/01/2023 for nurse to remove sutures from leg. Appointment for post op scheduled with Dr. Cherilyn Corn.

## 2023-09-29 ENCOUNTER — Encounter: Admitting: Surgery

## 2023-09-29 DIAGNOSIS — C44722 Squamous cell carcinoma of skin of right lower limb, including hip: Secondary | ICD-10-CM | POA: Diagnosis not present

## 2023-09-29 DIAGNOSIS — K2991 Gastroduodenitis, unspecified, with bleeding: Secondary | ICD-10-CM | POA: Diagnosis not present

## 2023-09-29 DIAGNOSIS — I4821 Permanent atrial fibrillation: Secondary | ICD-10-CM | POA: Diagnosis not present

## 2023-09-29 DIAGNOSIS — K2971 Gastritis, unspecified, with bleeding: Secondary | ICD-10-CM | POA: Diagnosis not present

## 2023-09-29 DIAGNOSIS — E871 Hypo-osmolality and hyponatremia: Secondary | ICD-10-CM | POA: Diagnosis not present

## 2023-09-29 DIAGNOSIS — J9601 Acute respiratory failure with hypoxia: Secondary | ICD-10-CM | POA: Diagnosis not present

## 2023-09-29 DIAGNOSIS — D62 Acute posthemorrhagic anemia: Secondary | ICD-10-CM | POA: Diagnosis not present

## 2023-09-29 DIAGNOSIS — I272 Pulmonary hypertension, unspecified: Secondary | ICD-10-CM | POA: Diagnosis not present

## 2023-09-29 DIAGNOSIS — I110 Hypertensive heart disease with heart failure: Secondary | ICD-10-CM | POA: Diagnosis not present

## 2023-09-29 DIAGNOSIS — E039 Hypothyroidism, unspecified: Secondary | ICD-10-CM | POA: Diagnosis not present

## 2023-09-29 DIAGNOSIS — R339 Retention of urine, unspecified: Secondary | ICD-10-CM | POA: Diagnosis not present

## 2023-09-29 DIAGNOSIS — K643 Fourth degree hemorrhoids: Secondary | ICD-10-CM | POA: Diagnosis not present

## 2023-09-29 DIAGNOSIS — I2781 Cor pulmonale (chronic): Secondary | ICD-10-CM | POA: Diagnosis not present

## 2023-09-29 DIAGNOSIS — Z556 Problems related to health literacy: Secondary | ICD-10-CM | POA: Diagnosis not present

## 2023-09-29 DIAGNOSIS — I50810 Right heart failure, unspecified: Secondary | ICD-10-CM | POA: Diagnosis not present

## 2023-09-29 DIAGNOSIS — I5033 Acute on chronic diastolic (congestive) heart failure: Secondary | ICD-10-CM | POA: Diagnosis not present

## 2023-09-29 DIAGNOSIS — J470 Bronchiectasis with acute lower respiratory infection: Secondary | ICD-10-CM | POA: Diagnosis not present

## 2023-09-29 DIAGNOSIS — N179 Acute kidney failure, unspecified: Secondary | ICD-10-CM | POA: Diagnosis not present

## 2023-09-29 DIAGNOSIS — D509 Iron deficiency anemia, unspecified: Secondary | ICD-10-CM | POA: Diagnosis not present

## 2023-09-29 DIAGNOSIS — M810 Age-related osteoporosis without current pathological fracture: Secondary | ICD-10-CM | POA: Diagnosis not present

## 2023-10-01 ENCOUNTER — Telehealth: Payer: Self-pay | Admitting: *Deleted

## 2023-10-01 DIAGNOSIS — K2971 Gastritis, unspecified, with bleeding: Secondary | ICD-10-CM | POA: Diagnosis not present

## 2023-10-01 DIAGNOSIS — K2991 Gastroduodenitis, unspecified, with bleeding: Secondary | ICD-10-CM | POA: Diagnosis not present

## 2023-10-01 DIAGNOSIS — E871 Hypo-osmolality and hyponatremia: Secondary | ICD-10-CM | POA: Diagnosis not present

## 2023-10-01 DIAGNOSIS — K643 Fourth degree hemorrhoids: Secondary | ICD-10-CM | POA: Diagnosis not present

## 2023-10-01 DIAGNOSIS — I5033 Acute on chronic diastolic (congestive) heart failure: Secondary | ICD-10-CM | POA: Diagnosis not present

## 2023-10-01 DIAGNOSIS — N179 Acute kidney failure, unspecified: Secondary | ICD-10-CM | POA: Diagnosis not present

## 2023-10-01 DIAGNOSIS — I272 Pulmonary hypertension, unspecified: Secondary | ICD-10-CM | POA: Diagnosis not present

## 2023-10-01 DIAGNOSIS — R339 Retention of urine, unspecified: Secondary | ICD-10-CM | POA: Diagnosis not present

## 2023-10-01 DIAGNOSIS — I110 Hypertensive heart disease with heart failure: Secondary | ICD-10-CM | POA: Diagnosis not present

## 2023-10-01 DIAGNOSIS — I2781 Cor pulmonale (chronic): Secondary | ICD-10-CM | POA: Diagnosis not present

## 2023-10-01 DIAGNOSIS — M810 Age-related osteoporosis without current pathological fracture: Secondary | ICD-10-CM | POA: Diagnosis not present

## 2023-10-01 DIAGNOSIS — I50810 Right heart failure, unspecified: Secondary | ICD-10-CM | POA: Diagnosis not present

## 2023-10-01 DIAGNOSIS — J9601 Acute respiratory failure with hypoxia: Secondary | ICD-10-CM | POA: Diagnosis not present

## 2023-10-01 DIAGNOSIS — E039 Hypothyroidism, unspecified: Secondary | ICD-10-CM | POA: Diagnosis not present

## 2023-10-01 DIAGNOSIS — D509 Iron deficiency anemia, unspecified: Secondary | ICD-10-CM | POA: Diagnosis not present

## 2023-10-01 DIAGNOSIS — Z556 Problems related to health literacy: Secondary | ICD-10-CM | POA: Diagnosis not present

## 2023-10-01 DIAGNOSIS — C44722 Squamous cell carcinoma of skin of right lower limb, including hip: Secondary | ICD-10-CM | POA: Diagnosis not present

## 2023-10-01 DIAGNOSIS — J470 Bronchiectasis with acute lower respiratory infection: Secondary | ICD-10-CM | POA: Diagnosis not present

## 2023-10-01 DIAGNOSIS — I4821 Permanent atrial fibrillation: Secondary | ICD-10-CM | POA: Diagnosis not present

## 2023-10-01 DIAGNOSIS — D62 Acute posthemorrhagic anemia: Secondary | ICD-10-CM | POA: Diagnosis not present

## 2023-10-01 NOTE — Telephone Encounter (Signed)
 Surgical Date: 09/13/2023 Procedure: Excision RLE SCC  Patient seen in office for suture removal from right lower leg. Sutures removed and steri strips applied. Patient tolerated procedure well. Incision healing as expected. No redness, tenderness, drainage, dehiscence noted.    Patient is scheduled for post op with Dr. Cherilyn Corn on 10/07/2023.

## 2023-10-05 ENCOUNTER — Telehealth: Payer: Self-pay | Admitting: Cardiology

## 2023-10-05 DIAGNOSIS — Z556 Problems related to health literacy: Secondary | ICD-10-CM | POA: Diagnosis not present

## 2023-10-05 DIAGNOSIS — I50810 Right heart failure, unspecified: Secondary | ICD-10-CM | POA: Diagnosis not present

## 2023-10-05 DIAGNOSIS — J470 Bronchiectasis with acute lower respiratory infection: Secondary | ICD-10-CM | POA: Diagnosis not present

## 2023-10-05 DIAGNOSIS — M810 Age-related osteoporosis without current pathological fracture: Secondary | ICD-10-CM | POA: Diagnosis not present

## 2023-10-05 DIAGNOSIS — I5033 Acute on chronic diastolic (congestive) heart failure: Secondary | ICD-10-CM | POA: Diagnosis not present

## 2023-10-05 DIAGNOSIS — C44722 Squamous cell carcinoma of skin of right lower limb, including hip: Secondary | ICD-10-CM | POA: Diagnosis not present

## 2023-10-05 DIAGNOSIS — I10 Essential (primary) hypertension: Secondary | ICD-10-CM | POA: Diagnosis not present

## 2023-10-05 DIAGNOSIS — I5032 Chronic diastolic (congestive) heart failure: Secondary | ICD-10-CM | POA: Diagnosis not present

## 2023-10-05 DIAGNOSIS — I272 Pulmonary hypertension, unspecified: Secondary | ICD-10-CM | POA: Diagnosis not present

## 2023-10-05 DIAGNOSIS — Z09 Encounter for follow-up examination after completed treatment for conditions other than malignant neoplasm: Secondary | ICD-10-CM | POA: Diagnosis not present

## 2023-10-05 DIAGNOSIS — E039 Hypothyroidism, unspecified: Secondary | ICD-10-CM | POA: Diagnosis not present

## 2023-10-05 DIAGNOSIS — I4821 Permanent atrial fibrillation: Secondary | ICD-10-CM | POA: Diagnosis not present

## 2023-10-05 DIAGNOSIS — K2991 Gastroduodenitis, unspecified, with bleeding: Secondary | ICD-10-CM | POA: Diagnosis not present

## 2023-10-05 DIAGNOSIS — J9601 Acute respiratory failure with hypoxia: Secondary | ICD-10-CM | POA: Diagnosis not present

## 2023-10-05 DIAGNOSIS — I2781 Cor pulmonale (chronic): Secondary | ICD-10-CM | POA: Diagnosis not present

## 2023-10-05 DIAGNOSIS — K2971 Gastritis, unspecified, with bleeding: Secondary | ICD-10-CM | POA: Diagnosis not present

## 2023-10-05 DIAGNOSIS — E871 Hypo-osmolality and hyponatremia: Secondary | ICD-10-CM | POA: Diagnosis not present

## 2023-10-05 DIAGNOSIS — D649 Anemia, unspecified: Secondary | ICD-10-CM | POA: Diagnosis not present

## 2023-10-05 DIAGNOSIS — I110 Hypertensive heart disease with heart failure: Secondary | ICD-10-CM | POA: Diagnosis not present

## 2023-10-05 DIAGNOSIS — R339 Retention of urine, unspecified: Secondary | ICD-10-CM | POA: Diagnosis not present

## 2023-10-05 DIAGNOSIS — D509 Iron deficiency anemia, unspecified: Secondary | ICD-10-CM | POA: Diagnosis not present

## 2023-10-05 DIAGNOSIS — N179 Acute kidney failure, unspecified: Secondary | ICD-10-CM | POA: Diagnosis not present

## 2023-10-05 DIAGNOSIS — K643 Fourth degree hemorrhoids: Secondary | ICD-10-CM | POA: Diagnosis not present

## 2023-10-05 DIAGNOSIS — Z299 Encounter for prophylactic measures, unspecified: Secondary | ICD-10-CM | POA: Diagnosis not present

## 2023-10-05 DIAGNOSIS — D62 Acute posthemorrhagic anemia: Secondary | ICD-10-CM | POA: Diagnosis not present

## 2023-10-05 MED ORDER — FUROSEMIDE 40 MG PO TABS: ORAL_TABLET | ORAL | 3 refills | Status: DC

## 2023-10-05 NOTE — Telephone Encounter (Addendum)
 Pt came into [place] office and explained she has been in the hospital and when released they prescribed her Furosemide  she has been taking 2 every other day. She if she needs to continue taking it 1 daily after she finishes the 2 every other day and if so can We send it to [employer] order.

## 2023-10-05 NOTE — Telephone Encounter (Signed)
 Advised to continue same instructions for lasix  40 mg alternating with 80 mg. Request new prescription be sent to [facility]. Advised that new refills will be sent per her request.

## 2023-10-06 NOTE — Progress Notes (Signed)
 GI Office Note    Referring Provider: Orlena Bitters, MD Primary Care Physician:  Orlena Bitters, MD Primary Gastroenterologist: Muhammad Faizan Ahmed, MD  Date:  10/11/2023  ID:  Shelly Fields, DOB 01-27-1935, MRN [id number]  Chief Complaint   Chief Complaint  Patient presents with   Follow-up    Pt here for hospital follow up. States she feels better   History of Present Illness  Shelly Fields is a 89 y.o. female with a history of HTN, hypothyroidism, persistent A-fib on Xarelto , pulmonary hypertension, HFpEF, diverticulosis, and chronic cor pulmonale presenting today for hospital follow up of anemia.  Last office visit September 2024.  At that point she reported worsening peripheral edema, seeing fresh blood upon wiping with bowel movements though denies large amounts.  Reporting Bristol 4 stools and taking Metamucil daily as well as Senokot.  Reported good appetite.  She also reported some recent hematuria.  Given her IDA she was recommended to have bidirectional endoscopy for further evaluation given her outpatient hemoglobin went from 11.5-8.4.  She was advised to take MiraLAX  twice daily for a week then reduce to once daily and continue her Metamucil, increasing to twice daily.  Advised abdominal ultrasound given anasarca noted by office visit.  EGD October 2024: - Normal esophagus.  - Gastroesophageal flap valve classified as Hill Grade III ( minimal fold, loose to endoscope,hiatal hernia likely) .  - Gastritis. Biopsied.  - Normal duodenal bulb and second portion of the duodenum.   Colonoscopy October 2024: - Hemorrhoids found on perianal exam.  - Diverticulosis in the left colon.  - Bleeding external hemorrhoids.  - Non- bleeding internal hemorrhoids.  - The examined portion of the ileum was normal. - Likely cause of hematochezia and IDA is Hemorrhoidal bleed - Use p.o iron every other day, referred to colorectal surgery - anusol  BID and preparation H for 5 days - high  fiber diet and metamucil advised  Recently seen inpatient for severe anemia, hemoglobin 5.9.  Given she was having respiratory difficulties during this time, endoscopic procedures were not pursued.  Given she had been on Xarelto  for A-fib, she was given Andexxa  for reversal.  It was suspected while inpatient that she likely had a slow chronic ooze from her hemorrhoids over the last 3 months given she had appropriate response to transfusion.  Labs for hemolytic anemia ordered including LDH and haptoglobin.  Increased iron to once daily.  Given she had grade 4 hemorrhoids and an anal lesion, advised that malignancy needed to be excluded therefore she was advised to follow-up with colorectal surgery.  He again advised follow-up with general surgery or colorectal surgery for further treatment of hemorrhoids/anal canal lesion.  Also advised to rule out H. pylori by stopping PPI for 2-3 weeks and then performing breath test.     Latest Ref Rng & Units 09/24/2023    1:36 PM 09/22/2023    2:55 AM 09/21/2023    3:42 AM  CBC  WBC 3.4 - 10.8 x10E3/uL 6.1  6.6  6.2   Hemoglobin 11.1 - 15.9 g/dL 40.9  81.1  9.8   Hematocrit 34.0 - 46.6 % 35.6  33.7  32.2   Platelets 150 - 450 x10E3/uL 248  263  245     Today:  She reports previously having swelling in the perianal region. She repots the main region she went to the hospital was the fatigue and Doe and not wanting to eat.   States not she is feeling  better overall. She is eating better and more back to her normal and being more active. She has been getting home health RN and PT as well. She does a walk once a week with a group of women at her church and do exercises. She is taking iron tablet every other day. She reports seen Larrie Po about her hemorrhoids. Has an appointment with Dr. Andy Bannister in [place] 5/21.   Denies chest pain, shortness of breath, syncope, presyncope, N/V, dysphagia.   Wants a copy of her lab work today.   Wt Readings from Last 3  Encounters:  10/11/23 92 lb 12.8 oz (42.1 kg)  10/07/23 93 lb (42.2 kg)  09/16/23 110 lb 3.7 oz (50 kg)    Current Outpatient Medications  Medication Sig Dispense Refill   ALPRAZolam  (XANAX ) 0.25 MG tablet Take 0.125-0.25 mg by mouth daily as needed for anxiety or sleep.     Carboxymethylcellulose Sodium (THERATEARS OP) Place 1 drop into both eyes daily.      dapagliflozin  propanediol (FARXIGA ) 10 MG TABS tablet Take 1 tablet (10 mg total) by mouth daily before breakfast. 90 tablet 3   denosumab  (PROLIA ) 60 MG/ML SOSY injection Inject 60 mg into the skin every 6 (six) months.     docusate sodium  (COLACE) 100 MG capsule Take 1 capsule (100 mg total) by mouth 2 (two) times daily. 60 capsule 2   ferrous gluconate  (FERGON) 324 MG tablet Take 1 tablet (324 mg total) by mouth every other day.     furosemide  (LASIX ) 40 MG tablet Take 40 mg Daily alternating with 80 mg Daily 135 tablet 3   levothyroxine  (SYNTHROID ) 75 MCG tablet Take 75 mcg by mouth daily before breakfast.     magnesium hydroxide (MILK OF MAGNESIA) 400 MG/5ML suspension Take 15 mLs by mouth daily as needed for mild constipation.     metoprolol  succinate (TOPROL -XL) 50 MG 24 hr tablet TAKE 1 AND 1/2 TABLETS BY MOUTH  TWICE DAILY 300 tablet 2   oxyCODONE  (ROXICODONE ) 5 MG immediate release tablet Take 1 tablet (5 mg total) by mouth every 6 (six) hours as needed. (Patient taking differently: Take 5 mg by mouth every 6 (six) hours as needed for moderate pain (pain score 4-6).) 4 tablet 0   potassium chloride  SA (KLOR-CON  M) 20 MEQ tablet Take 1 tablet (20 mEq total) by mouth daily. 90 tablet 1   psyllium (METAMUCIL) 58.6 % packet Take 1 packet by mouth 2 (two) times daily. (Patient taking differently: Take 1 packet by mouth at bedtime.) 30 each 12   No current facility-administered medications for this visit.    Past Medical History:  Diagnosis Date   Cor pulmonale (chronic) (HCC)    Diverticulosis    Essential hypertension     Hypothyroidism    Internal hemorrhoids    Persistent atrial fibrillation (HCC)    Pulmonary hypertension (HCC)    Seasonal allergies    Shingles     Past Surgical History:  Procedure Laterality Date   ABDOMINAL HYSTERECTOMY     BIOPSY  03/25/2023   Procedure: BIOPSY;  Surgeon: Hargis Lias, MD;  [facility];  Service: Endoscopy;;   CATARACT EXTRACTION     COLONOSCOPY N/A 01/12/2013   Procedure: COLONOSCOPY;  Surgeon: Ruby Corporal, MD;  [facility];  Service: Endoscopy;  Laterality: N/A;  1030   COLONOSCOPY N/A 11/18/2017   Procedure: COLONOSCOPY;  Surgeon: Ruby Corporal, MD;  [facility];  Service: Endoscopy;  Laterality:  N/A;   COLONOSCOPY WITH PROPOFOL  N/A 03/25/2023   Procedure: COLONOSCOPY WITH PROPOFOL ;  Surgeon: Hargis Lias, MD;  [facility];  Service: Endoscopy;  Laterality: N/A;  11:00AM;ASA 3   ESOPHAGOGASTRODUODENOSCOPY (EGD) WITH PROPOFOL  N/A 03/25/2023   Procedure: ESOPHAGOGASTRODUODENOSCOPY (EGD) WITH PROPOFOL ;  Surgeon: Hargis Lias, MD;  [facility];  Service: Endoscopy;  Laterality: N/A;  11:00;ASA 3   LESION REMOVAL Right 09/13/2023   Procedure: excision of right lower extremity squamous cell carcinoma;  Surgeon: Marijo Shove, DO;  Location: [facility];  Service: General;  Laterality: Right;   POLYPECTOMY  11/18/2017   Procedure: POLYPECTOMY;  Surgeon: Ruby Corporal, MD;  [facility];  Service: Endoscopy;;  colon    Family History  Problem Relation Age of Onset   Heart disease Mother    Hypertension Father    Colon cancer Neg Hx     Allergies as of 10/11/2023   (No Known Allergies)    Social History   Socioeconomic History   Marital status: Widowed    Spouse name: Not on file   Number of children: Not on file   Years of education: Not on file   Highest education level: Not on file  Occupational History   Not on file  Tobacco Use   Smoking status: Never     Passive exposure: Past   Smokeless tobacco: Never  Vaping Use   Vaping status: Never Used  Substance and Sexual Activity   Alcohol use: No    Alcohol/week: 0.0 standard drinks of alcohol   Drug use: No   Sexual activity: Not on file  Other Topics Concern   Not on file  Social History Narrative   Not on file   Social Drivers of Health   Financial Resource Strain: Not on file  Food Insecurity: No Food Insecurity (09/16/2023)   Hunger Vital Sign    Worried About Running Out of Food in the Last Year: Never true    Ran Out of Food in the Last Year: Never true  Transportation Needs: No Transportation Needs (09/16/2023)   PRAPARE - Administrator, Civil Service (Medical): No    Lack of Transportation (Non-Medical): No  Physical Activity: Not on file  Stress: Not on file  Social Connections: Moderately Integrated (09/16/2023)   Social Connection and Isolation Panel [NHANES]    Frequency of Communication with Friends and Family: More than three times a week    Frequency of Social Gatherings with Friends and Family: More than three times a week    Attends Religious Services: 1 to 4 times per year    Active Member of [employer] or Organizations: Patient declined    Attends Banker Meetings: 1 to 4 times per year    Marital Status: Widowed     Review of Systems   Gen: Denies fever, chills, anorexia. Denies fatigue, weakness, weight loss.  CV: + irregular heart rate. Denies chest pain,syncope, peripheral edema, and claudication. Resp: Denies dyspnea at rest, cough, wheezing, coughing up blood, and pleurisy. GI: See HPI Derm: Denies rash, itching, dry skin Psych: Denies depression, anxiety, memory loss, confusion. No homicidal or suicidal ideation.  Heme: Denies bruising, bleeding, and enlarged lymph nodes.  Physical Exam   BP (!) 168/76   Pulse 80   Temp 98.3 F (36.8 C)   Ht 5\' 2"  (1.575 m)   Wt 92 lb 12.8 oz (42.1 kg)   BMI 16.97 kg/m   General:  Alert and oriented. No distress noted. Pleasant and cooperative.  Head:  Normocephalic and atraumatic. Eyes:  Conjuctiva clear without scleral icterus. Mouth:  Oral mucosa pink and moist. Good dentition. No lesions. Lungs:  Clear to auscultation bilaterally. No wheezes, rales, or rhonchi. No distress.  Heart: Regular rate, irregular rhythm. Abdomen:  +BS, soft, non-tender and non-distended. No rebound or guarding. No HSM or masses noted. Rectal: deferred Msk:  Symmetrical without gross deformities. Normal posture. Extremities:  Without edema. Neurologic:  Alert and  oriented x4 Psych:  Alert and cooperative. Normal mood and affect.  Assessment  Shelly Fields is a 89 y.o. female with a history of HTN, hypothyroidism, persistent A-fib on Xarelto , pulmonary hypertension, HFpEF, diverticulosis, and chronic cor pulmonale presenting today with   Iron deficiency anemia: - Hgb has normalized since hospitalization - IDA likely combination of slow oozing from hemorrhoids/anal lesion given she was on Xarelto  - Received blood while inpatient. - Maintained on iron every other day currently - Denies any shortness of breath, chest pain, restless leg, significant fatigue, syncope, or presyncope since discharge. - EGD and colonoscopy up-to-date, see HPI. - Given that she is not currently on a blood thinner, will for now plan for repeat CBC with iron panel in 3 months.  Hemorrhoids, rectal bleeding: -not currently having any rectal bleeding -denies rectal pain  -reports swelling to the perineum/perianal region  H. Pylori infection: - H. pylori identified on EGD in October 2024 - Treated with Pylera - Has not yet had documented eradication.   - Given she is not currently on a PPI we will assess with breath test. - Recently received antibiotics while inpatient for pneumonia therefore we will wait at least 2 more weeks and then pursue testing. - Will retreat if she remains positive - This could be a  potential etiology for her IDA, although not likely her most recent episode which required hospitalization - Has upcoming follow-up with colorectal surgery in regards to further evaluation of grade 4 hemorrhoids and suspected anal lesion  Constipation: Well-controlled with Metamucil 1 tablespoon twice daily and daily stool softener.  Denies any incomplete emptying, hard stools, or straining.  Reports bowel movements are daily.  PLAN   Keep follow up with colorectal surgery Continue oral iron every other day H. pylori eradication testing - perform in 2 weeks (after 5/19) Monitor CBC every 3 months. Continue metamucil 1 tbls spoon BID Continue stool softener Follow up in 6 months.    Julian Obey, MSN, FNP-BC, AGACNP-BC [facility]

## 2023-10-07 ENCOUNTER — Ambulatory Visit (INDEPENDENT_AMBULATORY_CARE_PROVIDER_SITE_OTHER): Admitting: Surgery

## 2023-10-07 ENCOUNTER — Encounter: Payer: Self-pay | Admitting: Surgery

## 2023-10-07 VITALS — BP 138/77 | HR 77 | Temp 97.8°F | Resp 14 | Ht 62.0 in | Wt 93.0 lb

## 2023-10-07 DIAGNOSIS — Z09 Encounter for follow-up examination after completed treatment for conditions other than malignant neoplasm: Secondary | ICD-10-CM

## 2023-10-07 NOTE — Progress Notes (Signed)
 [facility] Note   HPI:  89 y.o. Female presents to clinic for post-op follow-up status post right lower extremity squamous cell carcinoma excision on 4/7.  She has been doing very well from a surgical standpoint.  She did get admitted to the hospital with anemia 4 days postoperatively, at which time she was taken off her blood thinners and her diuretics were changed.  Since then she has been doing okay.  She denies any blood in her bowel movements.  She denies problems at her incision site on her leg.  Her stitches were removed on 4/25 by our office nurse.  Denies fevers and chills.  Review of Systems:  All other review of systems: otherwise negative   Vital Signs:  BP 138/77   Pulse 77   Temp 97.8 F (36.6 C) (Oral)   Resp 14   Ht 5\' 2"  (1.575 m)   Wt 93 lb (42.2 kg)   SpO2 99%   BMI 17.01 kg/m    Physical Exam:  Physical Exam Vitals reviewed.  Constitutional:      Appearance: Normal appearance.  Skin:    General: Skin is warm and dry.     Comments: Right lower extremity incision site healing well with minimal underlying fullness, no erythema, induration, or tenderness  Neurological:     Mental Status: She is alert.     Laboratory studies: None   Imaging:  None  Pathology:  A.   SKIN, RIGHT LOWER LEG, EXCISION: RESIDUAL SQUAMOUS CELL CARCINOMA,  SMALL FOCUS, MARGINS FREE OF MALIGNANCY  COMMENT: Sections show actinic keratosis and focal residual nests of  squamous cell carcinoma in the dermis with biopsy site changes. The  margins are free of squamous cell carcinoma.   Assessment:  89 y.o. yo Female who presents for follow-up status post right lower extremity squamous cell carcinoma excision on 4/7.  Plan:  - Patient is doing well from a postsurgical standpoint - Advised that she should hold off on putting any lotions on the incision site for 4 weeks after surgery.  After that time, she can apply lotion to the area - Follow up with me as  needed  All of the above recommendations were discussed with the patient, and all of patient's questions were answered to her expressed satisfaction.  Note: Portions of this report may have been transcribed using voice recognition software. Every effort has been made to ensure accuracy; however, inadvertent computerized transcription errors may still be present.   Lidia Reels, DO [facility] [address] [place], [place] [address] [phone] (office)

## 2023-10-08 DIAGNOSIS — E039 Hypothyroidism, unspecified: Secondary | ICD-10-CM | POA: Diagnosis not present

## 2023-10-08 DIAGNOSIS — Z556 Problems related to health literacy: Secondary | ICD-10-CM | POA: Diagnosis not present

## 2023-10-08 DIAGNOSIS — J9601 Acute respiratory failure with hypoxia: Secondary | ICD-10-CM | POA: Diagnosis not present

## 2023-10-08 DIAGNOSIS — D509 Iron deficiency anemia, unspecified: Secondary | ICD-10-CM | POA: Diagnosis not present

## 2023-10-08 DIAGNOSIS — I110 Hypertensive heart disease with heart failure: Secondary | ICD-10-CM | POA: Diagnosis not present

## 2023-10-08 DIAGNOSIS — M810 Age-related osteoporosis without current pathological fracture: Secondary | ICD-10-CM | POA: Diagnosis not present

## 2023-10-08 DIAGNOSIS — I50810 Right heart failure, unspecified: Secondary | ICD-10-CM | POA: Diagnosis not present

## 2023-10-08 DIAGNOSIS — D62 Acute posthemorrhagic anemia: Secondary | ICD-10-CM | POA: Diagnosis not present

## 2023-10-08 DIAGNOSIS — N179 Acute kidney failure, unspecified: Secondary | ICD-10-CM | POA: Diagnosis not present

## 2023-10-08 DIAGNOSIS — I272 Pulmonary hypertension, unspecified: Secondary | ICD-10-CM | POA: Diagnosis not present

## 2023-10-08 DIAGNOSIS — K643 Fourth degree hemorrhoids: Secondary | ICD-10-CM | POA: Diagnosis not present

## 2023-10-08 DIAGNOSIS — E871 Hypo-osmolality and hyponatremia: Secondary | ICD-10-CM | POA: Diagnosis not present

## 2023-10-08 DIAGNOSIS — K2971 Gastritis, unspecified, with bleeding: Secondary | ICD-10-CM | POA: Diagnosis not present

## 2023-10-08 DIAGNOSIS — K2991 Gastroduodenitis, unspecified, with bleeding: Secondary | ICD-10-CM | POA: Diagnosis not present

## 2023-10-08 DIAGNOSIS — I2781 Cor pulmonale (chronic): Secondary | ICD-10-CM | POA: Diagnosis not present

## 2023-10-08 DIAGNOSIS — I4821 Permanent atrial fibrillation: Secondary | ICD-10-CM | POA: Diagnosis not present

## 2023-10-08 DIAGNOSIS — J470 Bronchiectasis with acute lower respiratory infection: Secondary | ICD-10-CM | POA: Diagnosis not present

## 2023-10-08 DIAGNOSIS — R339 Retention of urine, unspecified: Secondary | ICD-10-CM | POA: Diagnosis not present

## 2023-10-08 DIAGNOSIS — I5033 Acute on chronic diastolic (congestive) heart failure: Secondary | ICD-10-CM | POA: Diagnosis not present

## 2023-10-08 DIAGNOSIS — C44722 Squamous cell carcinoma of skin of right lower limb, including hip: Secondary | ICD-10-CM | POA: Diagnosis not present

## 2023-10-11 ENCOUNTER — Encounter: Payer: Self-pay | Admitting: Gastroenterology

## 2023-10-11 ENCOUNTER — Ambulatory Visit: Admitting: Gastroenterology

## 2023-10-11 VITALS — BP 168/76 | HR 80 | Temp 98.3°F | Ht 62.0 in | Wt 92.8 lb

## 2023-10-11 DIAGNOSIS — D509 Iron deficiency anemia, unspecified: Secondary | ICD-10-CM | POA: Diagnosis not present

## 2023-10-11 DIAGNOSIS — Z8619 Personal history of other infectious and parasitic diseases: Secondary | ICD-10-CM | POA: Diagnosis not present

## 2023-10-11 DIAGNOSIS — K5900 Constipation, unspecified: Secondary | ICD-10-CM | POA: Diagnosis not present

## 2023-10-11 DIAGNOSIS — A048 Other specified bacterial intestinal infections: Secondary | ICD-10-CM

## 2023-10-11 DIAGNOSIS — K643 Fourth degree hemorrhoids: Secondary | ICD-10-CM | POA: Diagnosis not present

## 2023-10-11 DIAGNOSIS — K625 Hemorrhage of anus and rectum: Secondary | ICD-10-CM

## 2023-10-11 NOTE — Patient Instructions (Addendum)
 After 5/19 please go to [facility] to perform your breath test.  Please ensure you do not eat or drink anything other than water  for at least an hour prior to the test.  2 locations for [facility] in [place]:              1. [address], [place]              2. [address] [place]    Continue your Metamucil twice daily as well as your daily stool softener.  Continue taking your iron every other day.  I will recheck your hemoglobin in 3 months.  If you have any severe rectal bleeding please call the office.  We will follow-up in about 6 months, sooner if needed.

## 2023-10-13 ENCOUNTER — Telehealth: Payer: Self-pay | Admitting: Cardiology

## 2023-10-13 MED ORDER — DAPAGLIFLOZIN PROPANEDIOL 10 MG PO TABS: 10.0000 mg | ORAL_TABLET | Freq: Every day | ORAL | 11 refills | Status: DC

## 2023-10-13 NOTE — Telephone Encounter (Signed)
 Patient would like to talk to [facility] about this medication. She's weighing in at only 90lbs and would like to know if that's ok. She's concerned it maybe to strong.

## 2023-10-13 NOTE — Telephone Encounter (Signed)
 Pt received a text from [facility] that they're needing a new Rx submitted on her Farxiga 

## 2023-10-13 NOTE — Telephone Encounter (Addendum)
 Reports weighing daily. Denies dizziness, weakness, mouth dryness. Denies SOB or leg swelling. Reports BP is normal 127/70, 122/72. Reports she went grocery shopping today and didn't have any problems.  Patient is requesting if she can reduce her furosemide  dose. Confirmed she is taking furosemide  40 mg alternating with 80 mg daily and potassium 20 meq daily.  Advised message would be sent to provider for recommendations. Verbalized understanding.

## 2023-10-13 NOTE — Telephone Encounter (Signed)
 Refill request completed.

## 2023-10-14 DIAGNOSIS — D62 Acute posthemorrhagic anemia: Secondary | ICD-10-CM | POA: Diagnosis not present

## 2023-10-14 DIAGNOSIS — I5033 Acute on chronic diastolic (congestive) heart failure: Secondary | ICD-10-CM | POA: Diagnosis not present

## 2023-10-14 DIAGNOSIS — I4821 Permanent atrial fibrillation: Secondary | ICD-10-CM | POA: Diagnosis not present

## 2023-10-14 DIAGNOSIS — J470 Bronchiectasis with acute lower respiratory infection: Secondary | ICD-10-CM | POA: Diagnosis not present

## 2023-10-14 DIAGNOSIS — I2781 Cor pulmonale (chronic): Secondary | ICD-10-CM | POA: Diagnosis not present

## 2023-10-14 DIAGNOSIS — J9601 Acute respiratory failure with hypoxia: Secondary | ICD-10-CM | POA: Diagnosis not present

## 2023-10-14 DIAGNOSIS — D509 Iron deficiency anemia, unspecified: Secondary | ICD-10-CM | POA: Diagnosis not present

## 2023-10-14 DIAGNOSIS — I110 Hypertensive heart disease with heart failure: Secondary | ICD-10-CM | POA: Diagnosis not present

## 2023-10-14 DIAGNOSIS — I50810 Right heart failure, unspecified: Secondary | ICD-10-CM | POA: Diagnosis not present

## 2023-10-14 DIAGNOSIS — R339 Retention of urine, unspecified: Secondary | ICD-10-CM | POA: Diagnosis not present

## 2023-10-14 DIAGNOSIS — M810 Age-related osteoporosis without current pathological fracture: Secondary | ICD-10-CM | POA: Diagnosis not present

## 2023-10-14 DIAGNOSIS — E871 Hypo-osmolality and hyponatremia: Secondary | ICD-10-CM | POA: Diagnosis not present

## 2023-10-14 DIAGNOSIS — E039 Hypothyroidism, unspecified: Secondary | ICD-10-CM | POA: Diagnosis not present

## 2023-10-14 DIAGNOSIS — N179 Acute kidney failure, unspecified: Secondary | ICD-10-CM | POA: Diagnosis not present

## 2023-10-14 DIAGNOSIS — I272 Pulmonary hypertension, unspecified: Secondary | ICD-10-CM | POA: Diagnosis not present

## 2023-10-14 DIAGNOSIS — K643 Fourth degree hemorrhoids: Secondary | ICD-10-CM | POA: Diagnosis not present

## 2023-10-14 DIAGNOSIS — C44722 Squamous cell carcinoma of skin of right lower limb, including hip: Secondary | ICD-10-CM | POA: Diagnosis not present

## 2023-10-14 DIAGNOSIS — K2971 Gastritis, unspecified, with bleeding: Secondary | ICD-10-CM | POA: Diagnosis not present

## 2023-10-14 DIAGNOSIS — K2991 Gastroduodenitis, unspecified, with bleeding: Secondary | ICD-10-CM | POA: Diagnosis not present

## 2023-10-14 DIAGNOSIS — Z556 Problems related to health literacy: Secondary | ICD-10-CM | POA: Diagnosis not present

## 2023-10-14 MED ORDER — FUROSEMIDE 40 MG PO TABS: ORAL_TABLET | ORAL | Status: DC

## 2023-10-14 NOTE — Telephone Encounter (Signed)
 Patient informed and verbalized understanding of plan.

## 2023-10-14 NOTE — Addendum Note (Signed)
 Addended by: Rim Thatch M on: 10/14/2023 04:06 PM   Modules accepted: Orders

## 2023-10-19 ENCOUNTER — Telehealth: Payer: Self-pay | Admitting: Cardiology

## 2023-10-19 DIAGNOSIS — R339 Retention of urine, unspecified: Secondary | ICD-10-CM | POA: Diagnosis not present

## 2023-10-19 DIAGNOSIS — J470 Bronchiectasis with acute lower respiratory infection: Secondary | ICD-10-CM | POA: Diagnosis not present

## 2023-10-19 DIAGNOSIS — I272 Pulmonary hypertension, unspecified: Secondary | ICD-10-CM | POA: Diagnosis not present

## 2023-10-19 DIAGNOSIS — N179 Acute kidney failure, unspecified: Secondary | ICD-10-CM | POA: Diagnosis not present

## 2023-10-19 DIAGNOSIS — E039 Hypothyroidism, unspecified: Secondary | ICD-10-CM | POA: Diagnosis not present

## 2023-10-19 DIAGNOSIS — C44722 Squamous cell carcinoma of skin of right lower limb, including hip: Secondary | ICD-10-CM | POA: Diagnosis not present

## 2023-10-19 DIAGNOSIS — I50810 Right heart failure, unspecified: Secondary | ICD-10-CM | POA: Diagnosis not present

## 2023-10-19 DIAGNOSIS — Z556 Problems related to health literacy: Secondary | ICD-10-CM | POA: Diagnosis not present

## 2023-10-19 DIAGNOSIS — I4821 Permanent atrial fibrillation: Secondary | ICD-10-CM | POA: Diagnosis not present

## 2023-10-19 DIAGNOSIS — D509 Iron deficiency anemia, unspecified: Secondary | ICD-10-CM | POA: Diagnosis not present

## 2023-10-19 DIAGNOSIS — K2971 Gastritis, unspecified, with bleeding: Secondary | ICD-10-CM | POA: Diagnosis not present

## 2023-10-19 DIAGNOSIS — D62 Acute posthemorrhagic anemia: Secondary | ICD-10-CM | POA: Diagnosis not present

## 2023-10-19 DIAGNOSIS — K2991 Gastroduodenitis, unspecified, with bleeding: Secondary | ICD-10-CM | POA: Diagnosis not present

## 2023-10-19 DIAGNOSIS — E871 Hypo-osmolality and hyponatremia: Secondary | ICD-10-CM | POA: Diagnosis not present

## 2023-10-19 DIAGNOSIS — I5033 Acute on chronic diastolic (congestive) heart failure: Secondary | ICD-10-CM | POA: Diagnosis not present

## 2023-10-19 DIAGNOSIS — I2781 Cor pulmonale (chronic): Secondary | ICD-10-CM | POA: Diagnosis not present

## 2023-10-19 DIAGNOSIS — I110 Hypertensive heart disease with heart failure: Secondary | ICD-10-CM | POA: Diagnosis not present

## 2023-10-19 DIAGNOSIS — M810 Age-related osteoporosis without current pathological fracture: Secondary | ICD-10-CM | POA: Diagnosis not present

## 2023-10-19 DIAGNOSIS — J9601 Acute respiratory failure with hypoxia: Secondary | ICD-10-CM | POA: Diagnosis not present

## 2023-10-19 DIAGNOSIS — K643 Fourth degree hemorrhoids: Secondary | ICD-10-CM | POA: Diagnosis not present

## 2023-10-19 NOTE — Telephone Encounter (Signed)
 PT came into [place] office asking if her farxiga  will be mailed to her house.

## 2023-10-19 NOTE — Telephone Encounter (Signed)
 Spoke to patient and advised that Farxiga  was sent to [facility] and will be mailed to her home. Patient voiced understanding and had no further questions or concerns.

## 2023-10-20 DIAGNOSIS — K2971 Gastritis, unspecified, with bleeding: Secondary | ICD-10-CM | POA: Diagnosis not present

## 2023-10-20 DIAGNOSIS — K643 Fourth degree hemorrhoids: Secondary | ICD-10-CM | POA: Diagnosis not present

## 2023-10-20 DIAGNOSIS — I110 Hypertensive heart disease with heart failure: Secondary | ICD-10-CM | POA: Diagnosis not present

## 2023-10-20 DIAGNOSIS — K2991 Gastroduodenitis, unspecified, with bleeding: Secondary | ICD-10-CM | POA: Diagnosis not present

## 2023-10-20 DIAGNOSIS — I272 Pulmonary hypertension, unspecified: Secondary | ICD-10-CM | POA: Diagnosis not present

## 2023-10-20 DIAGNOSIS — M810 Age-related osteoporosis without current pathological fracture: Secondary | ICD-10-CM | POA: Diagnosis not present

## 2023-10-20 DIAGNOSIS — Z556 Problems related to health literacy: Secondary | ICD-10-CM | POA: Diagnosis not present

## 2023-10-20 DIAGNOSIS — D509 Iron deficiency anemia, unspecified: Secondary | ICD-10-CM | POA: Diagnosis not present

## 2023-10-20 DIAGNOSIS — J470 Bronchiectasis with acute lower respiratory infection: Secondary | ICD-10-CM | POA: Diagnosis not present

## 2023-10-20 DIAGNOSIS — N179 Acute kidney failure, unspecified: Secondary | ICD-10-CM | POA: Diagnosis not present

## 2023-10-20 DIAGNOSIS — J9601 Acute respiratory failure with hypoxia: Secondary | ICD-10-CM | POA: Diagnosis not present

## 2023-10-20 DIAGNOSIS — I50810 Right heart failure, unspecified: Secondary | ICD-10-CM | POA: Diagnosis not present

## 2023-10-20 DIAGNOSIS — D62 Acute posthemorrhagic anemia: Secondary | ICD-10-CM | POA: Diagnosis not present

## 2023-10-20 DIAGNOSIS — C44722 Squamous cell carcinoma of skin of right lower limb, including hip: Secondary | ICD-10-CM | POA: Diagnosis not present

## 2023-10-20 DIAGNOSIS — E871 Hypo-osmolality and hyponatremia: Secondary | ICD-10-CM | POA: Diagnosis not present

## 2023-10-20 DIAGNOSIS — E039 Hypothyroidism, unspecified: Secondary | ICD-10-CM | POA: Diagnosis not present

## 2023-10-20 DIAGNOSIS — I2781 Cor pulmonale (chronic): Secondary | ICD-10-CM | POA: Diagnosis not present

## 2023-10-20 DIAGNOSIS — I4821 Permanent atrial fibrillation: Secondary | ICD-10-CM | POA: Diagnosis not present

## 2023-10-20 DIAGNOSIS — I5033 Acute on chronic diastolic (congestive) heart failure: Secondary | ICD-10-CM | POA: Diagnosis not present

## 2023-10-20 DIAGNOSIS — R339 Retention of urine, unspecified: Secondary | ICD-10-CM | POA: Diagnosis not present

## 2023-10-21 ENCOUNTER — Inpatient Hospital Stay (HOSPITAL_COMMUNITY)
Admission: EM | Admit: 2023-10-21 | Discharge: 2023-10-26 | DRG: 065 | Disposition: A | Discharge status: Rehabilitation Facility | Attending: Internal Medicine | Admitting: Internal Medicine

## 2023-10-21 ENCOUNTER — Encounter (HOSPITAL_COMMUNITY): Payer: Self-pay

## 2023-10-21 ENCOUNTER — Other Ambulatory Visit: Payer: Self-pay

## 2023-10-21 ENCOUNTER — Emergency Department (HOSPITAL_COMMUNITY)

## 2023-10-21 DIAGNOSIS — Z743 Need for continuous supervision: Secondary | ICD-10-CM | POA: Diagnosis not present

## 2023-10-21 DIAGNOSIS — G8194 Hemiplegia, unspecified affecting left nondominant side: Secondary | ICD-10-CM | POA: Diagnosis present

## 2023-10-21 DIAGNOSIS — G936 Cerebral edema: Secondary | ICD-10-CM | POA: Diagnosis not present

## 2023-10-21 DIAGNOSIS — Y92009 Unspecified place in unspecified non-institutional (private) residence as the place of occurrence of the external cause: Secondary | ICD-10-CM | POA: Diagnosis not present

## 2023-10-21 DIAGNOSIS — K5900 Constipation, unspecified: Secondary | ICD-10-CM | POA: Diagnosis present

## 2023-10-21 DIAGNOSIS — I10 Essential (primary) hypertension: Secondary | ICD-10-CM | POA: Diagnosis not present

## 2023-10-21 DIAGNOSIS — S20419A Abrasion of unspecified back wall of thorax, initial encounter: Secondary | ICD-10-CM | POA: Insufficient documentation

## 2023-10-21 DIAGNOSIS — G4700 Insomnia, unspecified: Secondary | ICD-10-CM | POA: Insufficient documentation

## 2023-10-21 DIAGNOSIS — E119 Type 2 diabetes mellitus without complications: Secondary | ICD-10-CM | POA: Diagnosis present

## 2023-10-21 DIAGNOSIS — W19XXXA Unspecified fall, initial encounter: Secondary | ICD-10-CM

## 2023-10-21 DIAGNOSIS — I4819 Other persistent atrial fibrillation: Secondary | ICD-10-CM | POA: Diagnosis present

## 2023-10-21 DIAGNOSIS — S20419S Abrasion of unspecified back wall of thorax, sequela: Secondary | ICD-10-CM | POA: Diagnosis not present

## 2023-10-21 DIAGNOSIS — E039 Hypothyroidism, unspecified: Secondary | ICD-10-CM | POA: Diagnosis not present

## 2023-10-21 DIAGNOSIS — R29818 Other symptoms and signs involving the nervous system: Secondary | ICD-10-CM | POA: Diagnosis not present

## 2023-10-21 DIAGNOSIS — I509 Heart failure, unspecified: Secondary | ICD-10-CM | POA: Diagnosis not present

## 2023-10-21 DIAGNOSIS — I4891 Unspecified atrial fibrillation: Secondary | ICD-10-CM | POA: Diagnosis not present

## 2023-10-21 DIAGNOSIS — F419 Anxiety disorder, unspecified: Secondary | ICD-10-CM | POA: Diagnosis present

## 2023-10-21 DIAGNOSIS — I2729 Other secondary pulmonary hypertension: Secondary | ICD-10-CM | POA: Diagnosis not present

## 2023-10-21 DIAGNOSIS — Z79899 Other long term (current) drug therapy: Secondary | ICD-10-CM

## 2023-10-21 DIAGNOSIS — R29704 NIHSS score 4: Secondary | ICD-10-CM | POA: Diagnosis present

## 2023-10-21 DIAGNOSIS — I672 Cerebral atherosclerosis: Secondary | ICD-10-CM | POA: Diagnosis not present

## 2023-10-21 DIAGNOSIS — R29810 Facial weakness: Secondary | ICD-10-CM | POA: Diagnosis present

## 2023-10-21 DIAGNOSIS — I639 Cerebral infarction, unspecified: Secondary | ICD-10-CM | POA: Diagnosis not present

## 2023-10-21 DIAGNOSIS — R4781 Slurred speech: Secondary | ICD-10-CM | POA: Diagnosis not present

## 2023-10-21 DIAGNOSIS — Z79620 Long term (current) use of immunosuppressive biologic: Secondary | ICD-10-CM | POA: Diagnosis not present

## 2023-10-21 DIAGNOSIS — Z66 Do not resuscitate: Secondary | ICD-10-CM | POA: Diagnosis present

## 2023-10-21 DIAGNOSIS — W06XXXA Fall from bed, initial encounter: Secondary | ICD-10-CM | POA: Diagnosis present

## 2023-10-21 DIAGNOSIS — I110 Hypertensive heart disease with heart failure: Secondary | ICD-10-CM | POA: Diagnosis not present

## 2023-10-21 DIAGNOSIS — I6529 Occlusion and stenosis of unspecified carotid artery: Secondary | ICD-10-CM | POA: Diagnosis not present

## 2023-10-21 DIAGNOSIS — I63511 Cerebral infarction due to unspecified occlusion or stenosis of right middle cerebral artery: Secondary | ICD-10-CM | POA: Diagnosis not present

## 2023-10-21 DIAGNOSIS — I6502 Occlusion and stenosis of left vertebral artery: Secondary | ICD-10-CM | POA: Diagnosis not present

## 2023-10-21 DIAGNOSIS — I6389 Other cerebral infarction: Secondary | ICD-10-CM | POA: Diagnosis not present

## 2023-10-21 DIAGNOSIS — Z7982 Long term (current) use of aspirin: Secondary | ICD-10-CM | POA: Diagnosis not present

## 2023-10-21 DIAGNOSIS — R531 Weakness: Secondary | ICD-10-CM | POA: Diagnosis not present

## 2023-10-21 DIAGNOSIS — Z8719 Personal history of other diseases of the digestive system: Secondary | ICD-10-CM | POA: Diagnosis not present

## 2023-10-21 DIAGNOSIS — Z79890 Hormone replacement therapy (postmenopausal): Secondary | ICD-10-CM

## 2023-10-21 DIAGNOSIS — R6889 Other general symptoms and signs: Secondary | ICD-10-CM | POA: Diagnosis not present

## 2023-10-21 LAB — CBC
HCT: 37.3 % (ref 36.0–46.0)
Hemoglobin: 11.6 g/dL — ABNORMAL LOW (ref 12.0–15.0)
MCH: 27.8 pg (ref 26.0–34.0)
MCHC: 31.1 g/dL (ref 30.0–36.0)
MCV: 89.4 fL (ref 80.0–100.0)
Platelets: 241 10*3/uL (ref 150–400)
RBC: 4.17 MIL/uL (ref 3.87–5.11)
RDW: 15.8 % — ABNORMAL HIGH (ref 11.5–15.5)
WBC: 7 10*3/uL (ref 4.0–10.5)
nRBC: 0 % (ref 0.0–0.2)

## 2023-10-21 LAB — PROTIME-INR
INR: 1.1 (ref 0.8–1.2)
Prothrombin Time: 14.5 s (ref 11.4–15.2)

## 2023-10-21 LAB — COMPREHENSIVE METABOLIC PANEL WITH GFR
ALT: 16 U/L (ref 0–44)
AST: 25 U/L (ref 15–41)
Albumin: 3.6 g/dL (ref 3.5–5.0)
Alkaline Phosphatase: 104 U/L (ref 38–126)
Anion gap: 10 (ref 5–15)
BUN: 18 mg/dL (ref 8–23)
CO2: 24 mmol/L (ref 22–32)
Calcium: 9.2 mg/dL (ref 8.9–10.3)
Chloride: 98 mmol/L (ref 98–111)
Creatinine, Ser: 0.75 mg/dL (ref 0.44–1.00)
GFR, Estimated: >60 mL/min (ref >60)
Glucose, Bld: 109 mg/dL — ABNORMAL HIGH (ref 70–99)
Potassium: 3.7 mmol/L (ref 3.5–5.1)
Sodium: 132 mmol/L — ABNORMAL LOW (ref 135–145)
Total Bilirubin: 0.9 mg/dL (ref 0.0–1.2)
Total Protein: 7.5 g/dL (ref 6.5–8.1)

## 2023-10-21 LAB — DIFFERENTIAL
Abs Immature Granulocytes: 0.02 10*3/uL (ref 0.00–0.07)
Basophils Absolute: 0.1 10*3/uL (ref 0.0–0.1)
Basophils Relative: 1 %
Eosinophils Absolute: 0.1 10*3/uL (ref 0.0–0.5)
Eosinophils Relative: 1 %
Immature Granulocytes: 0 %
Lymphocytes Relative: 7 %
Lymphs Abs: 0.5 10*3/uL — ABNORMAL LOW (ref 0.7–4.0)
Monocytes Absolute: 0.9 10*3/uL (ref 0.1–1.0)
Monocytes Relative: 13 %
Neutro Abs: 5.5 10*3/uL (ref 1.7–7.7)
Neutrophils Relative %: 78 %

## 2023-10-21 LAB — APTT: aPTT: 38 s — ABNORMAL HIGH (ref 24–36)

## 2023-10-21 LAB — CK: Total CK: 69 U/L (ref 38–234)

## 2023-10-21 MED ORDER — CLOPIDOGREL BISULFATE 75 MG PO TABS
300.0000 mg | ORAL_TABLET | Freq: Once | ORAL | Status: AC
  Administered 2023-10-22: 300 mg via ORAL
  Filled 2023-10-21: qty 4

## 2023-10-21 MED ORDER — INSULIN ASPART 100 UNIT/ML IJ SOLN: 0.0000 [IU] | INTRAMUSCULAR | Status: DC

## 2023-10-21 MED ORDER — ONDANSETRON HCL 4 MG PO TABS
4.0000 mg | ORAL_TABLET | Freq: Four times a day (QID) | ORAL | Status: DC | PRN
Start: 2023-10-21 — End: 2023-10-26

## 2023-10-21 MED ORDER — ACETAMINOPHEN 650 MG RE SUPP: 650.0000 mg | Freq: Four times a day (QID) | RECTAL | Status: DC | PRN

## 2023-10-21 MED ORDER — STROKE: EARLY STAGES OF RECOVERY BOOK
Freq: Once | Status: AC
  Filled 2023-10-21: qty 1

## 2023-10-21 MED ORDER — ACETAMINOPHEN 325 MG PO TABS
650.0000 mg | ORAL_TABLET | Freq: Four times a day (QID) | ORAL | Status: DC | PRN
  Administered 2023-10-26: 650 mg via ORAL
  Filled 2023-10-21: qty 2

## 2023-10-21 MED ORDER — ASPIRIN 81 MG PO CHEW
324.0000 mg | CHEWABLE_TABLET | Freq: Once | ORAL | Status: AC
  Administered 2023-10-22: 324 mg via ORAL
  Filled 2023-10-21: qty 4

## 2023-10-21 MED ORDER — MELATONIN 3 MG PO TABS
3.0000 mg | ORAL_TABLET | Freq: Every evening | ORAL | Status: DC | PRN
  Administered 2023-10-22 – 2023-10-25 (×4): 3 mg via ORAL
  Filled 2023-10-21 (×4): qty 1

## 2023-10-21 MED ORDER — IOHEXOL 350 MG/ML SOLN
75.0000 mL | Freq: Once | INTRAVENOUS | Status: AC | PRN
  Administered 2023-10-21: 75 mL via INTRAVENOUS

## 2023-10-21 MED ORDER — ONDANSETRON HCL 4 MG/2ML IJ SOLN: 4.0000 mg | Freq: Four times a day (QID) | INTRAMUSCULAR | Status: DC | PRN

## 2023-10-21 NOTE — ED Provider Notes (Signed)
 [place] EMERGENCY DEPARTMENT AT [facility] Provider Note   CSN: [id number] Arrival date & time: 10/21/23  1723     History  Chief Complaint  Patient presents with   Shelly Fields is a 89 y.o. female.  HPI     89 year old female comes into the emergency room with chief complaint of left-sided weakness.  Patient has history of A-fib.  She was on anticoagulation, but that was discontinued when she was found to have possible blood loss anemia.  Patient also has history of CHF.  Patient lives by herself.  Per family, she was last seen normal at 9:30 PM.  This afternoon, patient sent a text to her son, asking for help.  When he went inside the house, patient was on the floor.  Patient was profoundly lethargic.  He helped her get to the bathroom.  He did not notice any specific weakness at that time.  Patient took a nap, she was reassessed at 3:30 PM and at that time they noted that patient was weak on the left side.  Patient was specifically dragging her legs.  They also noted facial droop and slurred speech at that time.   While son is 100% confident that patient was last normal at 9:30 PM., He is just not 100% confident that she was completely normal at 1:00 when he saw her.  Family at the bedside indicated that patient thinks she took her thyroid  medications this morning, therefore they suspect that she might have had a fall in the morning sometime.  Patient normally does not fall.  Home Medications Prior to Admission medications   Medication Sig Start Date End Date Taking? Authorizing Provider  ALPRAZolam  (XANAX ) 0.25 MG tablet Take 0.125-0.25 mg by mouth daily as needed for anxiety or sleep. 07/22/23  Yes [provider]  Carboxymethylcellulose Sodium (THERATEARS OP) Place 1 drop into both eyes daily.    Yes [provider]  dapagliflozin  propanediol (FARXIGA ) 10 MG TABS tablet Take 1 tablet (10 mg total) by mouth daily before breakfast.  10/13/23  Yes Gerard Knight, MD  denosumab  (PROLIA ) 60 MG/ML SOSY injection Inject 60 mg into the skin every 6 (six) months.   Yes [provider]  docusate sodium  (COLACE) 100 MG capsule Take 1 capsule (100 mg total) by mouth 2 (two) times daily. 09/13/23 09/12/24 Yes Pappayliou, Arie Begin, DO  ferrous gluconate  (FERGON) 324 MG tablet Take 1 tablet (324 mg total) by mouth every other day. 03/25/23 09/01/24 Yes Ahmed, Forbes Ida, MD  furosemide  (LASIX ) 40 MG tablet Take 40 mg Daily alternating with 60 mg Daily Patient taking differently: Take 40-60 mg by mouth daily. Take 40 mg Daily alternating with 60 mg Daily 10/14/23  Yes Gerard Knight, MD  levothyroxine  (SYNTHROID ) 75 MCG tablet Take 75 mcg by mouth daily before breakfast.   Yes [provider]  metoprolol  succinate (TOPROL -XL) 50 MG 24 hr tablet TAKE 1 AND 1/2 TABLETS BY MOUTH  TWICE DAILY 04/08/23  Yes Gerard Knight, MD  potassium chloride  SA (KLOR-CON  M) 20 MEQ tablet Take 1 tablet (20 mEq total) by mouth daily. 03/10/23  Yes Gerard Knight, MD  psyllium (METAMUCIL) 58.6 % packet Take 1 packet by mouth 2 (two) times daily. Patient taking differently: Take 1 packet by mouth at bedtime. 03/25/23  Yes Ahmed, Forbes Ida, MD      Allergies    Patient has no known allergies.    Review of Systems  Review of Systems  All other systems reviewed and are negative.   Physical Exam Updated Vital Signs BP (!) 156/82 (BP Location: Left Arm)   Pulse 87   Temp 97.8 F (36.6 C) (Oral)   Resp 17   SpO2 97%  Physical Exam Vitals and nursing note reviewed.  Constitutional:      Appearance: She is well-developed.  HENT:     Head: Atraumatic.  Eyes:     Extraocular Movements: Extraocular movements intact.     Pupils: Pupils are equal, round, and reactive to light.  Cardiovascular:     Rate and Rhythm: Normal rate.  Pulmonary:     Effort: Pulmonary effort is normal.  Musculoskeletal:     Cervical back: Normal  range of motion and neck supple.  Skin:    General: Skin is warm and dry.  Neurological:     Mental Status: She is alert and oriented to person, place, and time.     Motor: Weakness present.     Comments: Left upper extremity weakness, left facial droop and slurred speech noted.     ED Results / Procedures / Treatments   Labs (all labs ordered are listed, but only abnormal results are displayed) Labs Reviewed  CBC - Abnormal; Notable for the following components:      Result Value   Hemoglobin 11.6 (*)    RDW 15.8 (*)    All other components within normal limits  DIFFERENTIAL - Abnormal; Notable for the following components:   Lymphs Abs 0.5 (*)    All other components within normal limits  COMPREHENSIVE METABOLIC PANEL WITH GFR - Abnormal; Notable for the following components:   Sodium 132 (*)    Glucose, Bld 109 (*)    All other components within normal limits  APTT - Abnormal; Notable for the following components:   aPTT 38 (*)    All other components within normal limits  PROTIME-INR  LIPID PANEL  HEMOGLOBIN A1C  COMPREHENSIVE METABOLIC PANEL WITH GFR  CBC  MAGNESIUM  PHOSPHORUS    EKG None  Radiology CT ANGIO HEAD NECK W WO CM Result Date: 10/21/2023 CLINICAL DATA:  Left-sided weakness EXAM: CT ANGIOGRAPHY HEAD AND NECK WITH AND WITHOUT CONTRAST TECHNIQUE: Multidetector CT imaging of the head and neck was performed using the standard protocol during bolus administration of intravenous contrast. Multiplanar CT image reconstructions and MIPs were obtained to evaluate the vascular anatomy. Carotid stenosis measurements (when applicable) are obtained utilizing NASCET criteria, using the distal internal carotid diameter as the denominator. RADIATION DOSE REDUCTION: This exam was performed according to the departmental dose-optimization program which includes automated exposure control, adjustment of the mA and/or kV according to patient size and/or use of iterative  reconstruction technique. CONTRAST:  75mL OMNIPAQUE  IOHEXOL  350 MG/ML SOLN COMPARISON:  None Available. FINDINGS: CT HEAD FINDINGS Brain: There is no mass, hemorrhage or extra-axial collection. The size and configuration of the ventricles and extra-axial CSF spaces are normal. There is hypoattenuation of the white matter, most commonly indicating chronic small vessel disease. Vascular: No hyperdense vessel or unexpected vascular calcification. Skull: The visualized skull base, calvarium and extracranial soft tissues are normal. Sinuses/Orbits: No fluid levels or advanced mucosal thickening of the visualized paranasal sinuses. No mastoid or middle ear effusion. Normal orbits. CTA NECK FINDINGS Skeleton: No acute abnormality or high grade bony spinal canal stenosis. Other neck: Normal pharynx, larynx and major salivary glands. No cervical lymphadenopathy. Unremarkable thyroid  gland. Upper chest: No pneumothorax or pleural effusion. No nodules  or masses. Aortic arch: There is calcific atherosclerosis of the aortic arch. Conventional 3 vessel aortic branching pattern. RIGHT carotid system: No dissection, occlusion or aneurysm. Mild atherosclerotic calcification at the carotid bifurcation without hemodynamically significant stenosis. LEFT carotid system: No dissection, occlusion or aneurysm. Mild atherosclerotic calcification at the carotid bifurcation without hemodynamically significant stenosis. Vertebral arteries: Codominant configuration. Mild stenosis of the left vertebral artery origin due to atherosclerotic calcification. Otherwise, the vertebral arteries are normal to the skull base. CTA HEAD FINDINGS POSTERIOR CIRCULATION: Vertebral arteries are normal. No proximal occlusion of the anterior or inferior cerebellar arteries. Basilar artery is normal. Superior cerebellar arteries are normal. Posterior cerebral arteries are normal. ANTERIOR CIRCULATION: Atherosclerotic calcification of the internal carotid arteries  at the skull base without hemodynamically significant stenosis. Anterior cerebral arteries are normal. There is occlusion of a distal M2 branch of the right MCA (series 9, image 198). The middle cerebral arteries are otherwise normal. Venous sinuses: As permitted by contrast timing, patent. Anatomic variants: None Review of the MIP images confirms the above findings. IMPRESSION: 1. Occlusion of a distal M2 branch of the right MCA. 2. No hemodynamically significant stenosis of the carotid arteries. 3. Mild stenosis of the left vertebral artery origin due to atherosclerotic calcification. Aortic Atherosclerosis (ICD10-I70.0). Critical Value/emergent results were called by telephone at the time of interpretation on 10/21/2023 at 9:23 pm to provider [facility] , who verbally acknowledged these results. Electronically Signed   By: Juanetta Nordmann M.D.   On: 10/21/2023 21:23    Procedures Procedures    Medications Ordered in ED Medications  aspirin chewable tablet 324 mg (has no administration in time range)  clopidogrel (PLAVIX) tablet 300 mg (has no administration in time range)   stroke: early stages of recovery book (has no administration in time range)  insulin aspart (novoLOG) injection 0-9 Units (has no administration in time range)  acetaminophen  (TYLENOL ) tablet 650 mg (has no administration in time range)    Or  acetaminophen  (TYLENOL ) suppository 650 mg (has no administration in time range)  ondansetron  (ZOFRAN ) tablet 4 mg (has no administration in time range)    Or  ondansetron  (ZOFRAN ) injection 4 mg (has no administration in time range)  iohexol  (OMNIPAQUE ) 350 MG/ML injection 75 mL (75 mLs Intravenous Contrast Given 10/21/23 1911)    ED Course/ Medical Decision Making/ A&P                   NIH Stroke Scale: 2              Medical Decision Making Amount and/or Complexity of Data Reviewed Labs: ordered. Radiology: ordered.  Risk OTC drugs. Prescription drug  management. Decision regarding hospitalization.   89 year old patient with history of A-fib comes in with chief complaint of fall, left-sided weakness and slurred speech.  Patient accompanied by her son, who provides collateral history as well.  Differential diagnosis from left-sided weakness perspective includes: Stroke - ischemic vs. Hemorrhagic versus embolic TIA Neuropathy Myelitis Electrolyte abnormality Neuropathy Muscular disease Unlikely to be cervical spine injury given that patient's symptoms are only left-sided.  From a trauma perspective, differential primarily includes brain bleed.  Patient's musculoskeletal exam is overall reassuring.  Other possibility includes dehydration, electrolyte abnormality.  Initial plan is to get basic labs, CT angio head and neck.  Reassessment: I have independently interpreted patient's labs.  She does not have any profound anemia, severe electrolyte abnormality.  I reviewed CT angiogram head and neck.  CT scan does not show  any brain bleed.  I received a phone call from radiologist, indicating that patient has distal MCA stroke, right side.   I discussed case with Dr. [name] Keto, neurology.  He is comfortable with patient being admitted to [facility].  He recommends loading patient with aspirin and Plavix.  ED findings discussed with the patient and family.  Stable for admission.   Final Clinical Impression(s) / ED Diagnoses Final diagnoses:  Acute ischemic right MCA stroke [facility])    Rx / DC Orders ED Discharge Orders     None         Deatra Face, MD 10/21/23 2316

## 2023-10-21 NOTE — ED Triage Notes (Addendum)
 Pt bib EMS for fall that happened around 10pm last night. Per EMS pt was getting out of bed using walker and fell back onto a night stand. Pt family reports left side weakness and said the pt was dragging he left leg earlier today, per ems symptoms have subsided pt has clear speech is Alert. Lives alone. Abrasions on back from nightstand

## 2023-10-21 NOTE — H&P (Addendum)
 History and Physical    Patient: Shelly Fields:[id number] DOB: August 01, 1934 DOA: 10/21/2023 DOS: the patient was seen and examined on 10/21/2023 PCP: Orlena Bitters, MD  Patient coming from: Home  Chief Complaint:  Chief Complaint  Patient presents with   Fall   HPI: Shelly Fields is an 89 y.o. female with medical history significant of atrial fibrillation, hypothyroidism, T2DM who presents to the emergency department from home via EMS due to fall sustained at home around 10 PM last night.  Patient was unable to provide history, history was obtained from EDP and ED medical record.  Per report, patient lives alone and family checked on her this afternoon around 1:30 PM and was found on the floor.  Apparently, patient got out of bed and tried to use a walker, but she fell back onto a nightstand and had abrasions on back.  Family noted patient having left-sided weakness and dragging her left leg, so EMS was activated and patient was sent to the ED for further evaluation and management.  ED Course:  In the emergency department, she was hemodynamically stable.  Workup in the ED showed normal CBC except for hemoglobin of 11.6.  BMP was normal except for sodium of 132 and blood glucose of 109. CT angiography head and neck with and without contrast showed occlusion of a distal M2 branch of the right MCA.  No hemodynamically significant stenosis of the carotid arteries.  Mild stenosis of the left vertebral artery origin due to atherosclerotic calcification. EDP reached out to Dr. Alecia Ames via secure chat and recommended that patient can stay here at [facility] and that neurologist can be consulted here in the morning.  Loading dose of aspirin and Plavix was recommended.  TRH was asked to admit patient.  Review of Systems: Review of systems as noted in the HPI. All other systems reviewed and are negative.   Past Medical History:  Diagnosis Date   Cor pulmonale (chronic) (HCC)    Diverticulosis     Essential hypertension    Hypothyroidism    Internal hemorrhoids    Persistent atrial fibrillation (HCC)    Pulmonary hypertension (HCC)    Seasonal allergies    Shingles    Past Surgical History:  Procedure Laterality Date   ABDOMINAL HYSTERECTOMY     BIOPSY  03/25/2023   Procedure: BIOPSY;  Surgeon: Hargis Lias, MD;  [facility];  Service: Endoscopy;;   CATARACT EXTRACTION     COLONOSCOPY N/A 01/12/2013   Procedure: COLONOSCOPY;  Surgeon: Ruby Corporal, MD;  [facility];  Service: Endoscopy;  Laterality: N/A;  1030   COLONOSCOPY N/A 11/18/2017   Procedure: COLONOSCOPY;  Surgeon: Ruby Corporal, MD;  [facility];  Service: Endoscopy;  Laterality: N/A;   COLONOSCOPY WITH PROPOFOL  N/A 03/25/2023   Procedure: COLONOSCOPY WITH PROPOFOL ;  Surgeon: Hargis Lias, MD;  [facility];  Service: Endoscopy;  Laterality: N/A;  11:00AM;ASA 3   ESOPHAGOGASTRODUODENOSCOPY (EGD) WITH PROPOFOL  N/A 03/25/2023   Procedure: ESOPHAGOGASTRODUODENOSCOPY (EGD) WITH PROPOFOL ;  Surgeon: Hargis Lias, MD;  [facility];  Service: Endoscopy;  Laterality: N/A;  11:00;ASA 3   LESION REMOVAL Right 09/13/2023   Procedure: excision of right lower extremity squamous cell carcinoma;  Surgeon: Marijo Shove, DO;  Location: [facility];  Service: General;  Laterality: Right;   POLYPECTOMY  11/18/2017   Procedure: POLYPECTOMY;  Surgeon: Ruby Corporal, MD;  [facility];  Service:  Endoscopy;;  colon    Social History:  reports that she has never smoked. She has been exposed to tobacco smoke. She has never used smokeless tobacco. She reports that she does not drink alcohol and does not use drugs.   No Known Allergies  Family History  Problem Relation Age of Onset   Heart disease Mother    Hypertension Father    Colon cancer Neg Hx      Prior to Admission medications   Medication Sig Start Date End Date Taking? Authorizing  Provider  ALPRAZolam  (XANAX ) 0.25 MG tablet Take 0.125-0.25 mg by mouth daily as needed for anxiety or sleep. 07/22/23  Yes [provider]  Carboxymethylcellulose Sodium (THERATEARS OP) Place 1 drop into both eyes daily.    Yes [provider]  dapagliflozin  propanediol (FARXIGA ) 10 MG TABS tablet Take 1 tablet (10 mg total) by mouth daily before breakfast. 10/13/23  Yes Gerard Knight, MD  denosumab  (PROLIA ) 60 MG/ML SOSY injection Inject 60 mg into the skin every 6 (six) months.   Yes [provider]  docusate sodium  (COLACE) 100 MG capsule Take 1 capsule (100 mg total) by mouth 2 (two) times daily. 09/13/23 09/12/24 Yes Pappayliou, Arie Begin, DO  ferrous gluconate  (FERGON) 324 MG tablet Take 1 tablet (324 mg total) by mouth every other day. 03/25/23 09/01/24 Yes Ahmed, Forbes Ida, MD  furosemide  (LASIX ) 40 MG tablet Take 40 mg Daily alternating with 60 mg Daily Patient taking differently: Take 40-60 mg by mouth daily. Take 40 mg Daily alternating with 60 mg Daily 10/14/23  Yes Gerard Knight, MD  levothyroxine  (SYNTHROID ) 75 MCG tablet Take 75 mcg by mouth daily before breakfast.   Yes [provider]  metoprolol  succinate (TOPROL -XL) 50 MG 24 hr tablet TAKE 1 AND 1/2 TABLETS BY MOUTH  TWICE DAILY 04/08/23  Yes Gerard Knight, MD  potassium chloride  SA (KLOR-CON  M) 20 MEQ tablet Take 1 tablet (20 mEq total) by mouth daily. 03/10/23  Yes Gerard Knight, MD  psyllium (METAMUCIL) 58.6 % packet Take 1 packet by mouth 2 (two) times daily. Patient taking differently: Take 1 packet by mouth at bedtime. 03/25/23  Yes Ahmed, Forbes Ida, MD    Physical Exam: BP (!) 161/85   Pulse 87   Temp 98.8 F (37.1 C) (Oral)   Resp 16   Ht 5\' 2"  (1.575 m)   Wt 40.4 kg   SpO2 99%   BMI 16.29 kg/m   General: 89 y.o. year-old female well developed well nourished in no acute distress.  Alert and oriented x3. HEENT: NCAT, EOMI Neck: Supple, trachea  medial Cardiovascular: Regular rate and rhythm with no rubs or gallops.  No thyromegaly or JVD noted.  No lower extremity edema. 2/4 pulses in all 4 extremities. Respiratory: Clear to auscultation with no wheezes or rales. Good inspiratory effort. Abdomen: Soft, nontender nondistended with normal bowel sounds x4 quadrants. Muskuloskeletal: No cyanosis, clubbing or edema noted bilaterally Neuro: CN II-XII intact, strength 5/5 x 4, sensation, reflexes intact Skin: No ulcerative lesions noted or rashes Psychiatry: Judgement and insight appear normal. Mood is appropriate for condition and setting          Labs on Admission:  Basic Metabolic Panel: Recent Labs  Lab 10/21/23 1854  NA 132*  K 3.7  CL 98  CO2 24  GLUCOSE 109*  BUN 18  CREATININE 0.75  CALCIUM 9.2   Liver Function Tests: Recent Labs  Lab 10/21/23 1854  AST 25  ALT 16  ALKPHOS 104  BILITOT 0.9  PROT 7.5  ALBUMIN  3.6   No results for input(s): "LIPASE", "AMYLASE" in the last 168 hours. No results for input(s): "AMMONIA" in the last 168 hours. CBC: Recent Labs  Lab 10/21/23 1854  WBC 7.0  NEUTROABS 5.5  HGB 11.6*  HCT 37.3  MCV 89.4  PLT 241   Cardiac Enzymes: No results for input(s): "CKTOTAL", "CKMB", "CKMBINDEX", "TROPONINI" in the last 168 hours.  BNP (last 3 results) Recent Labs    09/16/23 1645  BNP 636.0*    ProBNP (last 3 results) No results for input(s): "PROBNP" in the last 8760 hours.  CBG: No results for input(s): "GLUCAP" in the last 168 hours.  Radiological Exams on Admission: CT ANGIO HEAD NECK W WO CM Result Date: 10/21/2023 CLINICAL DATA:  Left-sided weakness EXAM: CT ANGIOGRAPHY HEAD AND NECK WITH AND WITHOUT CONTRAST TECHNIQUE: Multidetector CT imaging of the head and neck was performed using the standard protocol during bolus administration of intravenous contrast. Multiplanar CT image reconstructions and MIPs were obtained to evaluate the vascular anatomy. Carotid stenosis  measurements (when applicable) are obtained utilizing NASCET criteria, using the distal internal carotid diameter as the denominator. RADIATION DOSE REDUCTION: This exam was performed according to the departmental dose-optimization program which includes automated exposure control, adjustment of the mA and/or kV according to patient size and/or use of iterative reconstruction technique. CONTRAST:  75mL OMNIPAQUE  IOHEXOL  350 MG/ML SOLN COMPARISON:  None Available. FINDINGS: CT HEAD FINDINGS Brain: There is no mass, hemorrhage or extra-axial collection. The size and configuration of the ventricles and extra-axial CSF spaces are normal. There is hypoattenuation of the white matter, most commonly indicating chronic small vessel disease. Vascular: No hyperdense vessel or unexpected vascular calcification. Skull: The visualized skull base, calvarium and extracranial soft tissues are normal. Sinuses/Orbits: No fluid levels or advanced mucosal thickening of the visualized paranasal sinuses. No mastoid or middle ear effusion. Normal orbits. CTA NECK FINDINGS Skeleton: No acute abnormality or high grade bony spinal canal stenosis. Other neck: Normal pharynx, larynx and major salivary glands. No cervical lymphadenopathy. Unremarkable thyroid  gland. Upper chest: No pneumothorax or pleural effusion. No nodules or masses. Aortic arch: There is calcific atherosclerosis of the aortic arch. Conventional 3 vessel aortic branching pattern. RIGHT carotid system: No dissection, occlusion or aneurysm. Mild atherosclerotic calcification at the carotid bifurcation without hemodynamically significant stenosis. LEFT carotid system: No dissection, occlusion or aneurysm. Mild atherosclerotic calcification at the carotid bifurcation without hemodynamically significant stenosis. Vertebral arteries: Codominant configuration. Mild stenosis of the left vertebral artery origin due to atherosclerotic calcification. Otherwise, the vertebral arteries  are normal to the skull base. CTA HEAD FINDINGS POSTERIOR CIRCULATION: Vertebral arteries are normal. No proximal occlusion of the anterior or inferior cerebellar arteries. Basilar artery is normal. Superior cerebellar arteries are normal. Posterior cerebral arteries are normal. ANTERIOR CIRCULATION: Atherosclerotic calcification of the internal carotid arteries at the skull base without hemodynamically significant stenosis. Anterior cerebral arteries are normal. There is occlusion of a distal M2 branch of the right MCA (series 9, image 198). The middle cerebral arteries are otherwise normal. Venous sinuses: As permitted by contrast timing, patent. Anatomic variants: None Review of the MIP images confirms the above findings. IMPRESSION: 1. Occlusion of a distal M2 branch of the right MCA. 2. No hemodynamically significant stenosis of the carotid arteries. 3. Mild stenosis of the left vertebral artery origin due to atherosclerotic calcification. Aortic Atherosclerosis (ICD10-I70.0). Critical Value/emergent results were called by telephone at the time  of interpretation on 10/21/2023 at 9:23 pm to provider [facility] , who verbally acknowledged these results. Electronically Signed   By: Juanetta Nordmann M.D.   On: 10/21/2023 21:23    EKG: I independently viewed the EKG done and my findings are as followed: Atrial fibrillation with rate control  Assessment/Plan Present on Admission:  Acute ischemic stroke (HCC)  Persistent atrial fibrillation (HCC)  Hypothyroidism, acquired  Constipation  Principal Problem:   Acute ischemic stroke [facility]) Active Problems:   Hypothyroidism, acquired   Persistent atrial fibrillation (HCC)   Constipation   Controlled type 2 diabetes mellitus without complication, without long-term current use of insulin (HCC)   Anxiety   Insomnia   Fall at home, initial encounter   Abrasion of back  Acute ischemic stroke CT angiography head and neck with and without contrast showed  occlusion of a distal M2 branch of the right MCA Patient will be admitted to telemetry unit  Echocardiogram in the morning MRI of brain without contrast in the morning Aspirin 325 mg and Plavix 300 mg p.o. x 1 was given per neurologist recommendation Continue fall precautions and neuro checks Lipid panel and hemoglobin A1c will be checked Continue PT/SLP/OT eval and treat Bedside swallow eval by nursing prior to diet Neurologist will be consulted and we will await further recommendations  Fall at home Patient was on the floor for several hours Total CK will be checked Continue fall precaution  Abrasion of back Continue wound care  Persistent atrial fibrillation Metoprolol  will be temporarily held at this time due to need for permissive hypertension Patient is not on any blood thinner  Acquired hypothyroidism Continue Synthroid   Type 2 diabetes mellitus without complication Continue ISS and hypoglycemia protocol  Anxiety Continue Xanax  per home regimen  Constipation Continue Colace and psyllium per home regimen  Insomnia Continue melatonin  DVT prophylaxis: SCDs   Code Status: DNR  Family Communication: Family at bedside (all questions answered to satisfaction)  Consults: Neurology  Severity of Illness: The appropriate patient status for this patient is OBSERVATION. Observation status is judged to be reasonable and necessary in order to provide the required intensity of service to ensure the patient's safety. The patient's presenting symptoms, physical exam findings, and initial radiographic and laboratory data in the context of their medical condition is felt to place them at decreased risk for further clinical deterioration. Furthermore, it is anticipated that the patient will be medically stable for discharge from the hospital within 2 midnights of admission.   Author: Saphyra Hutt, DO 10/21/2023 11:37 PM  For on call review [email or web address].

## 2023-10-22 ENCOUNTER — Observation Stay (HOSPITAL_COMMUNITY)

## 2023-10-22 DIAGNOSIS — R29818 Other symptoms and signs involving the nervous system: Secondary | ICD-10-CM | POA: Diagnosis not present

## 2023-10-22 DIAGNOSIS — G936 Cerebral edema: Secondary | ICD-10-CM | POA: Diagnosis not present

## 2023-10-22 DIAGNOSIS — E119 Type 2 diabetes mellitus without complications: Secondary | ICD-10-CM

## 2023-10-22 DIAGNOSIS — I6389 Other cerebral infarction: Secondary | ICD-10-CM | POA: Diagnosis not present

## 2023-10-22 DIAGNOSIS — I63511 Cerebral infarction due to unspecified occlusion or stenosis of right middle cerebral artery: Secondary | ICD-10-CM | POA: Diagnosis not present

## 2023-10-22 DIAGNOSIS — R29704 NIHSS score 4: Secondary | ICD-10-CM | POA: Diagnosis not present

## 2023-10-22 DIAGNOSIS — I4891 Unspecified atrial fibrillation: Secondary | ICD-10-CM | POA: Diagnosis not present

## 2023-10-22 DIAGNOSIS — I10 Essential (primary) hypertension: Secondary | ICD-10-CM | POA: Diagnosis not present

## 2023-10-22 DIAGNOSIS — I639 Cerebral infarction, unspecified: Secondary | ICD-10-CM | POA: Diagnosis not present

## 2023-10-22 DIAGNOSIS — Z8719 Personal history of other diseases of the digestive system: Secondary | ICD-10-CM

## 2023-10-22 LAB — HEMOGLOBIN A1C
Hgb A1c MFr Bld: 5.2 % (ref 4.8–5.6)
Mean Plasma Glucose: 102.54 mg/dL

## 2023-10-22 LAB — GLUCOSE, CAPILLARY
Glucose-Capillary: 102 mg/dL — ABNORMAL HIGH (ref 70–99)
Glucose-Capillary: 105 mg/dL — ABNORMAL HIGH (ref 70–99)
Glucose-Capillary: 108 mg/dL — ABNORMAL HIGH (ref 70–99)
Glucose-Capillary: 115 mg/dL — ABNORMAL HIGH (ref 70–99)
Glucose-Capillary: 86 mg/dL (ref 70–99)
Glucose-Capillary: 91 mg/dL (ref 70–99)

## 2023-10-22 LAB — COMPREHENSIVE METABOLIC PANEL WITH GFR
ALT: 15 U/L (ref 0–44)
AST: 17 U/L (ref 15–41)
Albumin: 3.1 g/dL — ABNORMAL LOW (ref 3.5–5.0)
Alkaline Phosphatase: 87 U/L (ref 38–126)
Anion gap: 8 (ref 5–15)
BUN: 16 mg/dL (ref 8–23)
CO2: 22 mmol/L (ref 22–32)
Calcium: 8.5 mg/dL — ABNORMAL LOW (ref 8.9–10.3)
Chloride: 103 mmol/L (ref 98–111)
Creatinine, Ser: 0.86 mg/dL (ref 0.44–1.00)
GFR, Estimated: >60 mL/min (ref >60)
Glucose, Bld: 92 mg/dL (ref 70–99)
Potassium: 3.1 mmol/L — ABNORMAL LOW (ref 3.5–5.1)
Sodium: 133 mmol/L — ABNORMAL LOW (ref 135–145)
Total Bilirubin: 0.8 mg/dL (ref 0.0–1.2)
Total Protein: 6.3 g/dL — ABNORMAL LOW (ref 6.5–8.1)

## 2023-10-22 LAB — ECHOCARDIOGRAM LIMITED
AR max vel: 4.24 cm2
AV Area VTI: 3.02 cm2
AV Area mean vel: 2.9 cm2
AV Mean grad: 1.3 mmHg
AV Peak grad: 1.8 mmHg
Ao pk vel: 0.66 m/s
Area-P 1/2: 3.61 cm2
Calc EF: 63.7 %
Height: 62 in
S' Lateral: 2.36 cm
Single Plane A2C EF: 60.1 %
Single Plane A4C EF: 70 %
Weight: 1425.05 [oz_av]

## 2023-10-22 LAB — CBC
HCT: 33.8 % — ABNORMAL LOW (ref 36.0–46.0)
Hemoglobin: 10.2 g/dL — ABNORMAL LOW (ref 12.0–15.0)
MCH: 26.6 pg (ref 26.0–34.0)
MCHC: 30.2 g/dL (ref 30.0–36.0)
MCV: 88.3 fL (ref 80.0–100.0)
Platelets: 210 10*3/uL (ref 150–400)
RBC: 3.83 MIL/uL — ABNORMAL LOW (ref 3.87–5.11)
RDW: 15.8 % — ABNORMAL HIGH (ref 11.5–15.5)
WBC: 7.7 10*3/uL (ref 4.0–10.5)
nRBC: 0 % (ref 0.0–0.2)

## 2023-10-22 LAB — LIPID PANEL
Cholesterol: 112 mg/dL (ref 0–200)
HDL: 44 mg/dL (ref >40)
LDL Cholesterol: 59 mg/dL (ref 0–99)
Total CHOL/HDL Ratio: 2.5 ratio
Triglycerides: 43 mg/dL (ref <150)
VLDL: 9 mg/dL (ref 0–40)

## 2023-10-22 LAB — CK: Total CK: 50 U/L (ref 38–234)

## 2023-10-22 LAB — PHOSPHORUS: Phosphorus: 3.1 mg/dL (ref 2.5–4.6)

## 2023-10-22 LAB — MAGNESIUM: Magnesium: 1.9 mg/dL (ref 1.7–2.4)

## 2023-10-22 MED ORDER — PSYLLIUM 95 % PO PACK
1.0000 | PACK | Freq: Two times a day (BID) | ORAL | Status: DC
  Administered 2023-10-22 – 2023-10-26 (×10): 1 via ORAL
  Filled 2023-10-22 (×11): qty 1

## 2023-10-22 MED ORDER — CLOPIDOGREL BISULFATE 75 MG PO TABS
75.0000 mg | ORAL_TABLET | Freq: Every day | ORAL | Status: DC
  Administered 2023-10-22 – 2023-10-26 (×5): 75 mg via ORAL
  Filled 2023-10-22 (×5): qty 1

## 2023-10-22 MED ORDER — DOCUSATE SODIUM 100 MG PO CAPS
100.0000 mg | ORAL_CAPSULE | Freq: Two times a day (BID) | ORAL | Status: DC
  Administered 2023-10-22 – 2023-10-26 (×10): 100 mg via ORAL
  Filled 2023-10-22 (×10): qty 1

## 2023-10-22 MED ORDER — ASPIRIN 81 MG PO TBEC
81.0000 mg | DELAYED_RELEASE_TABLET | Freq: Every day | ORAL | Status: DC
  Administered 2023-10-22 – 2023-10-26 (×5): 81 mg via ORAL
  Filled 2023-10-22 (×5): qty 1

## 2023-10-22 MED ORDER — ALPRAZOLAM 0.25 MG PO TABS
0.1250 mg | ORAL_TABLET | Freq: Every day | ORAL | Status: DC | PRN
  Administered 2023-10-22 – 2023-10-23 (×2): 0.25 mg via ORAL
  Filled 2023-10-22 (×2): qty 1

## 2023-10-22 MED ORDER — INSULIN ASPART 100 UNIT/ML IJ SOLN: 0.0000 [IU] | Freq: Three times a day (TID) | INTRAMUSCULAR | Status: DC

## 2023-10-22 MED ORDER — POTASSIUM CHLORIDE CRYS ER 20 MEQ PO TBCR
40.0000 meq | EXTENDED_RELEASE_TABLET | Freq: Once | ORAL | Status: AC
  Administered 2023-10-22: 40 meq via ORAL
  Filled 2023-10-22: qty 2

## 2023-10-22 MED ORDER — LEVOTHYROXINE SODIUM 75 MCG PO TABS
75.0000 ug | ORAL_TABLET | Freq: Every day | ORAL | Status: DC
  Administered 2023-10-22 – 2023-10-26 (×5): 75 ug via ORAL
  Filled 2023-10-22 (×6): qty 1

## 2023-10-22 NOTE — Plan of Care (Signed)

## 2023-10-22 NOTE — Progress Notes (Signed)
 Mobility Specialist Progress Note:    10/22/23 1250  Mobility  Activity Transferred from chair to bed;Ambulated with assistance in room  Level of Assistance Moderate assist, patient does 50-74%  Assistive Device Front wheel walker  Distance Ambulated (ft) 4 ft  Range of Motion/Exercises Active;Right arm;Right leg;Left leg;Passive;Left arm  Activity Response Tolerated well  Mobility Referral Yes  Mobility visit 1 Mobility  Mobility Specialist Start Time (ACUTE ONLY) 1250  Mobility Specialist Stop Time (ACUTE ONLY) 1310  Mobility Specialist Time Calculation (min) (ACUTE ONLY) 20 min   Pt received in chair, requesting assistance to bed. Required ModA to stand and ambulate with RW. Tolerated well, LUE weakness. Alarm on, call bel in reach. All needs met.  Maryalice Pasley Mobility Specialist Please contact via Special educational needs teacher or  Rehab office at [phone]

## 2023-10-22 NOTE — TOC CM/SW Note (Signed)
 Transition of Care [facility]) - Inpatient Brief Assessment   Patient Details  Name: Shelly Fields MRN: [id number] Date of Birth: Jun 14, 1934  [facility] CM/SW Contact:    Grandville Lax, LCSWA Phone Number: 10/22/2023, 2:09 PM   Clinical Narrative: CSW notes that PT has recommended inpatient rehab for pt at [facility] CSW to follow for updates on CIR review. TOC to follow.   Transition of Care Asessment: Insurance and Status: Insurance coverage has been reviewed Patient has primary care physician: Yes Home environment has been reviewed: From home Prior level of function:: Independent Prior/Current Home Services: No current home services Social Drivers of Health Review: SDOH reviewed no interventions necessary Readmission risk has been reviewed: Yes Transition of care needs: no transition of care needs at this time

## 2023-10-22 NOTE — Evaluation (Signed)
 Speech Language Pathology Evaluation Patient Details Name: Shelly Fields MRN: [id number] DOB: 10-28-1934 Today's Date: 10/22/2023 Time: 4098-1191 SLP Time Calculation (min) (ACUTE ONLY): 20 min  Problem List:  Patient Active Problem List   Diagnosis Date Noted   Acute ischemic stroke (HCC) 10/21/2023   Controlled type 2 diabetes mellitus without complication, without long-term current use of insulin (HCC) 10/21/2023   Anxiety 10/21/2023   Insomnia 10/21/2023   Fall at home, initial encounter 10/21/2023   Abrasion of back 10/21/2023   Acute on chronic heart failure with preserved ejection fraction (HFpEF) (HCC) 09/22/2023   Atrial fibrillation with RVR (HCC) 09/20/2023   Acute blood loss anemia 09/16/2023   Squamous cell carcinoma of right lower leg 09/13/2023   Gastritis and gastroduodenitis 03/25/2023   Grade III hemorrhoids 03/25/2023   Anasarca 02/17/2023   Microcytic anemia 02/17/2023   Hematochezia 02/17/2023   Constipation 11/05/2022   Grade IV hemorrhoids 01/27/2022   Senile osteoporosis 02/14/2020   Persistent atrial fibrillation (HCC)    Change in stool caliber 10/22/2017   Pulmonary hypertension (HCC) 03/23/2017   Bronchiolectasis (HCC)  assoc with possible MAI  03/23/2017   Hypothyroidism, acquired 03/23/2017   Dyspnea on exertion 03/22/2017   Upper airway cough syndrome 03/22/2017   Syncope 06/23/2012   Paroxysmal atrial fibrillation (HCC) 03/14/2012   Essential hypertension, benign 03/14/2012   Past Medical History:  Past Medical History:  Diagnosis Date   Cor pulmonale (chronic) (HCC)    Diverticulosis    Essential hypertension    Hypothyroidism    Internal hemorrhoids    Persistent atrial fibrillation (HCC)    Pulmonary hypertension (HCC)    Seasonal allergies    Shingles    Past Surgical History:  Past Surgical History:  Procedure Laterality Date   ABDOMINAL HYSTERECTOMY     BIOPSY  03/25/2023   Procedure: BIOPSY;  Surgeon: Hargis Lias,  MD;  [facility];  Service: Endoscopy;;   CATARACT EXTRACTION     COLONOSCOPY N/A 01/12/2013   Procedure: COLONOSCOPY;  Surgeon: Ruby Corporal, MD;  [facility];  Service: Endoscopy;  Laterality: N/A;  1030   COLONOSCOPY N/A 11/18/2017   Procedure: COLONOSCOPY;  Surgeon: Ruby Corporal, MD;  [facility];  Service: Endoscopy;  Laterality: N/A;   COLONOSCOPY WITH PROPOFOL  N/A 03/25/2023   Procedure: COLONOSCOPY WITH PROPOFOL ;  Surgeon: Hargis Lias, MD;  [facility];  Service: Endoscopy;  Laterality: N/A;  11:00AM;ASA 3   ESOPHAGOGASTRODUODENOSCOPY (EGD) WITH PROPOFOL  N/A 03/25/2023   Procedure: ESOPHAGOGASTRODUODENOSCOPY (EGD) WITH PROPOFOL ;  Surgeon: Hargis Lias, MD;  [facility];  Service: Endoscopy;  Laterality: N/A;  11:00;ASA 3   LESION REMOVAL Right 09/13/2023   Procedure: excision of right lower extremity squamous cell carcinoma;  Surgeon: Marijo Shove, DO;  Location: [facility];  Service: General;  Laterality: Right;   POLYPECTOMY  11/18/2017   Procedure: POLYPECTOMY;  Surgeon: Ruby Corporal, MD;  [facility];  Service: Endoscopy;;  colon   HPI:  Shelly Fields is an 89 y.o. female with medical history significant of atrial fibrillation, hypothyroidism, T2DM who presents to the emergency department from home via EMS due to fall sustained at home around 10 PM last night.  Patient was unable to provide history, history was obtained from EDP and ED medical record.  Per report, patient lives alone and family checked on her this afternoon around 1:30 PM and was found on the floor. CT  angiography head and neck with and without contrast showed occlusion of a distal M2 branch of the right MCA. SLE requested   Assessment / Plan / Recommendation Clinical Impression  Speech and language evaluation completed while Pt was sitting upright in chair. Pt presents with slight left facial droop. Facial droop/weakness  results in very mild dysarthria; dysarthria characterized by mildly slurred speech however, intelligibility is not decreased. Throughout entire evaluation speech intelligibility was 100% accurate. Pt's cognition appears to be largely intact (memory, thought organization and executive functioning intact), however note some decreased awareness of new physical limitations/changes since the stroke. Physical therapy further reports decreased awareness during evaluation. Note Pt is very hard of hearing which also negatively impacts awareness and responsiveness to instruction. Pt will benefit from more in depth ST evaluation at d/c venue recommended below to facilitate safety and awareness as Pt would like to return home independently after rehab. There are no further ST needs in acute. Above to family and patient. Thank you,    SLP Assessment  SLP Recommendation/Assessment: All further Speech Lanaguage Pathology  needs can be addressed in the next venue of care SLP Visit Diagnosis: Dysarthria and anarthria (R47.1);Cognitive communication deficit (R41.841)    Recommendations for follow up therapy are one component of a multi-disciplinary discharge planning process, led by the attending physician.  Recommendations may be updated based on patient status, additional functional criteria and insurance authorization.    Follow Up Recommendations  Acute inpatient rehab (3hours/day)                SLP Evaluation Cognition  Overall Cognitive Status: Impaired/Different from baseline Attention: Focused Focused Attention: Appears intact Memory: Appears intact Awareness: Impaired Awareness Impairment: Intellectual impairment Problem Solving: Appears intact Executive Function: Reasoning Reasoning: Appears intact       Comprehension  Auditory Comprehension Overall Auditory Comprehension: Appears within functional limits for tasks assessed    Expression Expression Primary Mode of Expression: Verbal Verbal  Expression Overall Verbal Expression: Appears within functional limits for tasks assessed   Oral / Motor  Oral Motor/Sensory Function Overall Oral Motor/Sensory Function: Mild impairment Facial ROM: Reduced left Facial Symmetry: Abnormal symmetry left Facial Strength: Reduced left Facial Sensation: Reduced left Motor Speech Overall Motor Speech: Impaired Respiration: Within functional limits Articulation: Within functional limitis Intelligibility: Intelligible           Britania Shreeve H. Vergil Glasser, CCC-SLP Speech Language Pathologist  Florina Husbands 10/22/2023, 10:04 AM

## 2023-10-22 NOTE — Progress Notes (Signed)
  Echocardiogram 2D Echocardiogram has been performed.  Shelly Fields 10/22/2023, 2:57 PM

## 2023-10-22 NOTE — Progress Notes (Signed)
  Inpatient Rehab Admissions Coordinator :  Per therapy recommendations, patient was screened for CIR candidacy by Ottie Glazier RN MSN.  At this time patient appears to be a potential candidate for CIR. I will place a rehab consult per protocol for full assessment. Please call me with any questions.  Ottie Glazier RN MSN Admissions Coordinator [phone]

## 2023-10-22 NOTE — Plan of Care (Signed)
  Problem: Acute Rehab PT Goals(only PT should resolve) Goal: Pt Will Go Supine/Side To Sit Outcome: Progressing Flowsheets (Taken 10/22/2023 1344) Pt will go Supine/Side to Sit: with minimal assist Goal: Patient Will Transfer Sit To/From Stand Outcome: Progressing Flowsheets (Taken 10/22/2023 1344) Patient will transfer sit to/from stand:  with contact guard assist  with minimal assist Goal: Pt Will Transfer Bed To Chair/Chair To Bed Outcome: Progressing Flowsheets (Taken 10/22/2023 1344) Pt will Transfer Bed to Chair/Chair to Bed:  with contact guard assist  with min assist Goal: Pt Will Ambulate Outcome: Progressing Flowsheets (Taken 10/22/2023 1344) Pt will Ambulate:  50 feet  with minimal assist  with rolling walker   1:45 PM, 10/22/23 Walton Guppy, MPT Physical Therapist with [facility] 336 [phone] office [phone] mobile phone

## 2023-10-22 NOTE — Progress Notes (Signed)
 PROGRESS NOTE    Shelly Fields  ZOX:[id number] DOB: [birth year] DOA: 10/21/2023 PCP: Orlena Bitters, MD   Brief Narrative:    Shelly Fields is an 89 y.o. female with medical history significant of atrial fibrillation, hypothyroidism, T2DM who presents to the emergency department from home via EMS due to fall sustained at home at night and she was found the following day in the afternoon by family members.  She was noted to have significant left-sided weakness especially to her left leg.  She was admitted for evaluation of acute ischemic CVA.  Assessment & Plan:   Principal Problem:   Acute ischemic stroke [facility]) Active Problems:   Hypothyroidism, acquired   Persistent atrial fibrillation (HCC)   Constipation   Controlled type 2 diabetes mellitus without complication, without long-term current use of insulin (HCC)   Anxiety   Insomnia   Fall at home, initial encounter   Abrasion of back  Assessment and Plan:   Acute ischemic stroke CT angiography head and neck with and without contrast showed occlusion of a distal M2 branch of the right MCA Patient will be admitted to telemetry unit  Echocardiogram with findings of mild LVH and no other significant findings MRI of brain without contrast in the morning Aspirin 325 mg and Plavix 300 mg p.o. x 1 was given per neurologist recommendation Continue fall precautions and neuro checks Lipid panel, LDL 59 and hemoglobin A1c 8.1% PT recommending CIR Appreciate neurology evaluation   Fall at home Patient was on the floor for several hours Total CK within normal limits Continue fall precaution   Abrasion of back Continue wound care   Persistent atrial fibrillation Metoprolol  will be temporarily held at this time due to need for permissive hypertension Patient is not on any blood thinner   Acquired hypothyroidism Continue Synthroid    Type 2 diabetes mellitus without complication Continue ISS and hypoglycemia protocol    Anxiety Continue Xanax  per home regimen   Constipation Continue Colace and psyllium per home regimen   Insomnia Continue melatonin     DVT prophylaxis: SCDs Code Status: DNR Family Communication: Son at bedside 5/16 Disposition Plan:  Status is: Observation The patient will require care spanning > 2 midnights and should be moved to inpatient because: Need for placement to CIR.   Consultants:  Neurology  Procedures:  None  Antimicrobials:  None   Subjective: Patient seen and evaluated today with no new acute complaints or concerns. No acute concerns or events noted overnight.  Objective: Vitals:   10/22/23 0748 10/22/23 0951 10/22/23 1136 10/22/23 1500  BP: 132/67 137/71 (!) 148/66 131/64  Pulse: 72 76 64 73  Resp: 16 18 17 16   Temp: 98.3 F (36.8 C) (!) 97.5 F (36.4 C) 97.6 F (36.4 C) 98 F (36.7 C)  TempSrc: Oral Oral Oral Oral  SpO2: 100% 97% 90% 100%  Weight:      Height:        Intake/Output Summary (Last 24 hours) at 10/22/2023 1531 Last data filed at 10/22/2023 1012 Gross per 24 hour  Intake 240 ml  Output --  Net 240 ml   Filed Weights   10/21/23 2300  Weight: 40.4 kg    Examination:  General exam: Appears calm and comfortable, elderly/frail Respiratory system: Clear to auscultation. Respiratory effort normal. Cardiovascular system: S1 & S2 heard, RRR.  Gastrointestinal system: Abdomen is soft Central nervous system: Alert and awake Extremities: No edema Skin: No significant lesions noted Psychiatry: Flat affect.  Data Reviewed: I have personally reviewed following labs and imaging studies  CBC: Recent Labs  Lab 10/21/23 1854 10/22/23 0443  WBC 7.0 7.7  NEUTROABS 5.5  --   HGB 11.6* 10.2*  HCT 37.3 33.8*  MCV 89.4 88.3  PLT 241 210   Basic Metabolic Panel: Recent Labs  Lab 10/21/23 1854 10/22/23 0443  NA 132* 133*  K 3.7 3.1*  CL 98 103  CO2 24 22  GLUCOSE 109* 92  BUN 18 16  CREATININE 0.75 0.86  CALCIUM  9.2 8.5*  MG  --  1.9  PHOS  --  3.1   GFR: Estimated Creatinine Clearance: 29.9 mL/min (by C-G formula based on SCr of 0.86 mg/dL). Liver Function Tests: Recent Labs  Lab 10/21/23 1854 10/22/23 0443  AST 25 17  ALT 16 15  ALKPHOS 104 87  BILITOT 0.9 0.8  PROT 7.5 6.3*  ALBUMIN  3.6 3.1*   No results for input(s): "LIPASE", "AMYLASE" in the last 168 hours. No results for input(s): "AMMONIA" in the last 168 hours. Coagulation Profile: Recent Labs  Lab 10/21/23 1927  INR 1.1   Cardiac Enzymes: Recent Labs  Lab 10/21/23 1854 10/22/23 0443  CKTOTAL 69 50   BNP (last 3 results) No results for input(s): "PROBNP" in the last 8760 hours. HbA1C: Recent Labs    10/21/23 1854  HGBA1C 5.2   CBG: Recent Labs  Lab 10/22/23 0015 10/22/23 0421 10/22/23 0758 10/22/23 1108  GLUCAP 115* 102* 108* 86   Lipid Profile: Recent Labs    10/22/23 0443  CHOL 112  HDL 44  LDLCALC 59  TRIG 43  CHOLHDL 2.5   Thyroid  Function Tests: No results for input(s): "TSH", "T4TOTAL", "FREET4", "T3FREE", "THYROIDAB" in the last 72 hours. Anemia Panel: No results for input(s): "VITAMINB12", "FOLATE", "FERRITIN", "TIBC", "IRON", "RETICCTPCT" in the last 72 hours. Sepsis Labs: No results for input(s): "PROCALCITON", "LATICACIDVEN" in the last 168 hours.  No results found for this or any previous visit (from the past 240 hours).       Radiology Studies: ECHOCARDIOGRAM LIMITED Result Date: 10/22/2023    ECHOCARDIOGRAM LIMITED REPORT   Patient Name:   Shelly Fields [facility] Date of Exam: 10/22/2023 Medical Rec #:  [id number]      Height:       62.0 in Accession #:    [id number]     Weight:       89.1 lb Date of Birth:  [birth year]     BSA:          1.356 m Patient Age:    88 years       BP:           148/66 mmHg Patient Gender: F              HR:           73 bpm. Exam Location:  [facility] Procedure: Limited Echo, Cardiac Doppler and Color Doppler (Both Spectral and            Color Flow Doppler  were utilized during procedure). Indications:    Stroke  History:        Patient has prior history of Echocardiogram examinations, most                 recent 09/17/2023. CHF, Abnormal ECG, Stroke, Arrythmias:Atrial                 Fibrillation, Signs/Symptoms:Syncope; Risk Factors:Diabetes.  Sonographer:    Raynelle Callow RDCS Referring  Phys: [id number] OLADAPO ADEFESO IMPRESSIONS  1. There is mild left ventricular hypertrophy. Left ventricular diastolic parameters are indeterminate. There is the interventricular septum is flattened in systole and diastole, consistent with right ventricular pressure and volume overload.  2. Right ventricular systolic function is moderately reduced. The right ventricular size is severely enlarged. There is severely elevated pulmonary artery systolic pressure. The estimated right ventricular systolic pressure is 42.2 mmHg.  3. Left atrial size was severely dilated.  4. Right atrial size was severely dilated.  5. The mitral valve is abnormal. Mild mitral valve regurgitation. No evidence of mitral stenosis.  6. The tricuspid valve is abnormal. Tricuspid valve regurgitation is severe.  7. The aortic valve is tricuspid. Aortic valve regurgitation is mild. No aortic stenosis is present.  8. The inferior vena cava is dilated in size with <50% respiratory variability, suggesting right atrial pressure of 15 mmHg.  9. Limited echo in setting of stroke FINDINGS  Left Ventricle: The left ventricular internal cavity size was normal in size. There is mild left ventricular hypertrophy. The interventricular septum is flattened in systole and diastole, consistent with right ventricular pressure and volume overload. Left ventricular diastolic parameters are indeterminate. Right Ventricle: The right ventricular size is severely enlarged. Right ventricular systolic function is moderately reduced. There is severely elevated pulmonary artery systolic pressure. The tricuspid regurgitant velocity is 2.61 m/s, and  with an assumed right atrial pressure of 15 mmHg, the estimated right ventricular systolic pressure is 42.2 mmHg. Left Atrium: Left atrial size was severely dilated. Right Atrium: Right atrial size was severely dilated. Mitral Valve: The mitral valve is abnormal. Mild mitral valve regurgitation. No evidence of mitral valve stenosis. MV peak gradient, 6.6 mmHg. The mean mitral valve gradient is 2.0 mmHg. Tricuspid Valve: The tricuspid valve is abnormal. Tricuspid valve regurgitation is severe. No evidence of tricuspid stenosis. Aortic Valve: The aortic valve is tricuspid. Aortic valve regurgitation is mild. No aortic stenosis is present. Aortic valve mean gradient measures 1.3 mmHg. Aortic valve peak gradient measures 1.8 mmHg. Aortic valve area, by VTI measures 3.02 cm. Aorta: The aortic root and ascending aorta are structurally normal, with no evidence of dilitation. Venous: The inferior vena cava is dilated in size with less than 50% respiratory variability, suggesting right atrial pressure of 15 mmHg. IAS/Shunts: No atrial level shunt detected by color flow Doppler. LEFT VENTRICLE PLAX 2D LVIDd:         3.60 cm LVIDs:         2.36 cm LV PW:         1.10 cm LV IVS:        1.08 cm LVOT diam:     2.15 cm LV SV:         54 LV SV Index:   40 LVOT Area:     3.63 cm  LV Volumes (MOD) LV vol d, MOD A2C: 62.1 ml LV vol d, MOD A4C: 52.6 ml LV vol s, MOD A2C: 24.8 ml LV vol s, MOD A4C: 15.8 ml LV SV MOD A2C:     37.4 ml LV SV MOD A4C:     52.6 ml LV SV MOD BP:      36.8 ml IVC IVC diam: 3.50 cm LEFT ATRIUM             Index        RIGHT ATRIUM           Index LA Vol (A2C):   94.6 ml 69.79 ml/m  RA  Area:     40.60 cm LA Vol (A4C):   68.9 ml 50.83 ml/m  RA Volume:   168.00 ml 123.94 ml/m LA Biplane Vol: 79.3 ml 58.50 ml/m  AORTIC VALVE AV Area (Vmax):    4.24 cm AV Area (Vmean):   2.90 cm AV Area (VTI):     3.02 cm AV Vmax:           66.37 cm/s AV Vmean:          55.426 cm/s AV VTI:            0.179 m AV Peak Grad:       1.8 mmHg AV Mean Grad:      1.3 mmHg LVOT Vmax:         77.43 cm/s LVOT Vmean:        44.211 cm/s LVOT VTI:          0.149 m LVOT/AV VTI ratio: 0.83  AORTA Ao Root diam: 3.30 cm Ao Asc diam:  3.40 cm MITRAL VALVE               TRICUSPID VALVE MV Area (PHT): 3.61 cm    TR Peak grad:   27.2 mmHg MV Peak grad:  6.6 mmHg    TR Vmax:        261.00 cm/s MV Mean grad:  2.0 mmHg MV Vmax:       1.28 m/s    SHUNTS MV Vmean:      63.9 cm/s   Systemic VTI:  0.15 m MV Decel Time: 210 msec    Systemic Diam: 2.15 cm MV E velocity: 88.70 cm/s Armida Lander MD Electronically signed by Armida Lander MD Signature Date/Time: 10/22/2023/3:14:08 PM    Final    CT ANGIO HEAD NECK W WO CM Result Date: 10/21/2023 CLINICAL DATA:  Left-sided weakness EXAM: CT ANGIOGRAPHY HEAD AND NECK WITH AND WITHOUT CONTRAST TECHNIQUE: Multidetector CT imaging of the head and neck was performed using the standard protocol during bolus administration of intravenous contrast. Multiplanar CT image reconstructions and MIPs were obtained to evaluate the vascular anatomy. Carotid stenosis measurements (when applicable) are obtained utilizing NASCET criteria, using the distal internal carotid diameter as the denominator. RADIATION DOSE REDUCTION: This exam was performed according to the departmental dose-optimization program which includes automated exposure control, adjustment of the mA and/or kV according to patient size and/or use of iterative reconstruction technique. CONTRAST:  75mL OMNIPAQUE  IOHEXOL  350 MG/ML SOLN COMPARISON:  None Available. FINDINGS: CT HEAD FINDINGS Brain: There is no mass, hemorrhage or extra-axial collection. The size and configuration of the ventricles and extra-axial CSF spaces are normal. There is hypoattenuation of the white matter, most commonly indicating chronic small vessel disease. Vascular: No hyperdense vessel or unexpected vascular calcification. Skull: The visualized skull base, calvarium and extracranial soft  tissues are normal. Sinuses/Orbits: No fluid levels or advanced mucosal thickening of the visualized paranasal sinuses. No mastoid or middle ear effusion. Normal orbits. CTA NECK FINDINGS Skeleton: No acute abnormality or high grade bony spinal canal stenosis. Other neck: Normal pharynx, larynx and major salivary glands. No cervical lymphadenopathy. Unremarkable thyroid  gland. Upper chest: No pneumothorax or pleural effusion. No nodules or masses. Aortic arch: There is calcific atherosclerosis of the aortic arch. Conventional 3 vessel aortic branching pattern. RIGHT carotid system: No dissection, occlusion or aneurysm. Mild atherosclerotic calcification at the carotid bifurcation without hemodynamically significant stenosis. LEFT carotid system: No dissection, occlusion or aneurysm. Mild atherosclerotic calcification at the carotid bifurcation without hemodynamically significant  stenosis. Vertebral arteries: Codominant configuration. Mild stenosis of the left vertebral artery origin due to atherosclerotic calcification. Otherwise, the vertebral arteries are normal to the skull base. CTA HEAD FINDINGS POSTERIOR CIRCULATION: Vertebral arteries are normal. No proximal occlusion of the anterior or inferior cerebellar arteries. Basilar artery is normal. Superior cerebellar arteries are normal. Posterior cerebral arteries are normal. ANTERIOR CIRCULATION: Atherosclerotic calcification of the internal carotid arteries at the skull base without hemodynamically significant stenosis. Anterior cerebral arteries are normal. There is occlusion of a distal M2 branch of the right MCA (series 9, image 198). The middle cerebral arteries are otherwise normal. Venous sinuses: As permitted by contrast timing, patent. Anatomic variants: None Review of the MIP images confirms the above findings. IMPRESSION: 1. Occlusion of a distal M2 branch of the right MCA. 2. No hemodynamically significant stenosis of the carotid arteries. 3. Mild  stenosis of the left vertebral artery origin due to atherosclerotic calcification. Aortic Atherosclerosis (ICD10-I70.0). Critical Value/emergent results were called by telephone at the time of interpretation on 10/21/2023 at 9:23 pm to provider [facility] , who verbally acknowledged these results. Electronically Signed   By: Juanetta Nordmann M.D.   On: 10/21/2023 21:23     Scheduled Meds:  aspirin EC  81 mg Oral Daily   clopidogrel  75 mg Oral Daily   docusate sodium   100 mg Oral BID   insulin aspart  0-9 Units Subcutaneous TID WC   levothyroxine   75 mcg Oral Q0600   psyllium  1 packet Oral BID     LOS: 0 days    Time spent: 55 minutes    Mearle Drew Loran Rock, DO [facility]  If 7PM-7AM, please contact night-coverage [email or web address] 10/22/2023, 3:31 PM

## 2023-10-22 NOTE — Care Management Obs Status (Signed)
 MEDICARE OBSERVATION STATUS NOTIFICATION   Patient Details  Name: Shelly Fields MRN: [id number] Date of Birth: 10/29/1934   Medicare Observation Status Notification Given:  Yes    Geraldina Klinefelter, RN 10/22/2023, 7:09 PM

## 2023-10-22 NOTE — Plan of Care (Signed)
  Problem: Acute Rehab OT Goals (only OT should resolve) Goal: Pt. Will Perform Eating Flowsheets (Taken 10/22/2023 1149) Pt Will Perform Eating: with modified independence Goal: Pt. Will Perform Grooming Flowsheets (Taken 10/22/2023 1149) Pt Will Perform Grooming: with modified independence Goal: Pt. Will Perform Upper Body Bathing Flowsheets (Taken 10/22/2023 1149) Pt Will Perform Upper Body Bathing: with modified independence Goal: Pt. Will Perform Lower Body Bathing Flowsheets (Taken 10/22/2023 1149) Pt Will Perform Lower Body Bathing: with modified independence Goal: Pt. Will Perform Upper Body Dressing Flowsheets (Taken 10/22/2023 1149) Pt Will Perform Upper Body Dressing: with modified independence Goal: Pt. Will Perform Lower Body Dressing Flowsheets (Taken 10/22/2023 1149) Pt Will Perform Lower Body Dressing: with modified independence Goal: Pt. Will Transfer To Toilet Flowsheets (Taken 10/22/2023 1149) Pt Will Transfer to Toilet: with modified independence Goal: Pt. Will Perform Toileting-Clothing Manipulation Flowsheets (Taken 10/22/2023 1149) Pt Will Perform Toileting - Clothing Manipulation and hygiene: with modified independence Goal: Pt/Caregiver Will Perform Home Exercise Program Flowsheets (Taken 10/22/2023 1149) Pt/caregiver will Perform Home Exercise Program:  Increased strength  Left upper extremity  Right Upper extremity  Independently  Aaden Buckman OT, MOT

## 2023-10-22 NOTE — Evaluation (Signed)
 Occupational Therapy Evaluation Patient Details Name: Shelly Fields MRN: [id number] DOB: 02/15/1935 Today's Date: 10/22/2023   History of Present Illness   Shelly Fields is an 89 y.o. female with medical history significant of atrial fibrillation, hypothyroidism, T2DM who presents to the emergency department from home via EMS due to fall sustained at home around 10 PM last night.  Patient was unable to provide history, history was obtained from EDP and ED medical record.  Per report, patient lives alone and family checked on her this afternoon around 1:30 PM and was found on the floor.  Apparently, patient got out of bed and tried to use a walker, but she fell back onto a nightstand and had abrasions on back.  Family noted patient having left-sided weakness and dragging her left leg, so EMS was activated and patient was sent to the ED for further evaluation and management. (per DO)     Clinical Impressions Pt agreeable to OT and PT co-evaluation. Pt was pleasant and very hard of hearing. Pt had some difficulty following commands for visual testing and manual muscle testing. Pt demonstrates significant weakness in L UE and difficulty advancing L LE during ambulation with RW. Min A for bed mobility and mod A for ambulation with RW. Pt able to complete seated ADL's with min to mod A mostly due to L UE weakness and decreased coordination. Pt noted to veer to L side during ambulation. Difficult to formally assess vision. Pt uses the RW in the morning at home and is independent for ADL's. Pt drives and lives alone with family on the same property. Pt left in the chair with call bell within reach and family present. Pt will benefit from continued OT in the hospital and recommended venue below to increase strength, balance, and endurance for safe ADL's.        If plan is discharge home, recommend the following:   A lot of help with walking and/or transfers;A little help with  bathing/dressing/bathroom;Assistance with cooking/housework;Assistance with feeding;Assist for transportation;Help with stairs or ramp for entrance     Functional Status Assessment   Patient has had a recent decline in their functional status and demonstrates the ability to make significant improvements in function in a reasonable and predictable amount of time.     Equipment Recommendations   None recommended by OT     Recommendations for Other Services   Rehab consult     Precautions/Restrictions   Precautions Precautions: Fall Recall of Precautions/Restrictions: Intact Restrictions Weight Bearing Restrictions Per Provider Order: No     Mobility Bed Mobility Overal bed mobility: Needs Assistance Bed Mobility: Supine to Sit     Supine to sit: Min assist     General bed mobility comments: labored movement; min A to scoot to EOB    Transfers Overall transfer level: Needs assistance Equipment used: Rolling walker (2 wheels) Transfers: Sit to/from Stand, Bed to chair/wheelchair/BSC Sit to Stand: Mod assist, Min assist     Step pivot transfers: Mod assist     General transfer comment: unsteady and labored movement; difficulty advancing L LE      Balance Overall balance assessment: Needs assistance Sitting-balance support: Bilateral upper extremity supported, Feet supported Sitting balance-Leahy Scale: Poor Sitting balance - Comments: seated at EOB Postural control: Left lateral lean Standing balance support: During functional activity, Bilateral upper extremity supported, Reliant on assistive device for balance Standing balance-Leahy Scale: Poor Standing balance comment: using RW  ADL either performed or assessed with clinical judgement   ADL Overall ADL's : Needs assistance/impaired Eating/Feeding: Minimal assistance;Set up;Sitting   Grooming: Set up;Minimal assistance;Sitting   Upper Body Bathing: Minimal  assistance;Sitting   Lower Body Bathing: Minimal assistance;Sitting/lateral leans   Upper Body Dressing : Minimal assistance;Sitting   Lower Body Dressing: Minimal assistance;Sitting/lateral leans Lower Body Dressing Details (indicate cue type and reason): Able to reach B LE but difficulty coordinating L hand. Toilet Transfer: Moderate assistance;Rolling walker (2 wheels);Ambulation;Stand-pivot Toilet Transfer Details (indicate cue type and reason): Simualted via EOB to chair and ambulation in the hall. Toileting- Clothing Manipulation and Hygiene: Minimal assistance;Moderate assistance;Sitting/lateral lean   Tub/ Shower Transfer: Moderate assistance;Ambulation;Rolling walker (2 wheels)   Functional mobility during ADLs: Moderate assistance;Rolling walker (2 wheels) General ADL Comments: Able to walk in hall with RW but required seated rest in w/c due to L LE seeming to get weaker. Assist needed to steer RW.     Vision Baseline Vision/History: 1 Wears glasses Ability to See in Adequate Light: 1 Impaired Patient Visual Report: No change from baseline Vision Assessment?: Vision impaired- to be further tested in functional context (Difficult to assess. Pt noted to veer to L side during ambulation but this may be due to L UE and LE deficits rather than vision. Further evaluation needed.)     Perception Perception: Not tested       Praxis Praxis: Impaired   Praxis-Other Comments: Poor L UE coordination and motor planning.   Pertinent Vitals/Pain Pain Assessment Pain Assessment: No/denies pain     Extremity/Trunk Assessment Upper Extremity Assessment Upper Extremity Assessment: LUE deficits/detail (R UE WFL) LUE Deficits / Details: 3-/5 shoulder flexion; 3+/5 elbow, wrist, and grasp. Poor fine and gross motor coordination. LUE Sensation: WNL LUE Coordination: decreased gross motor;decreased fine motor   Lower Extremity Assessment Lower Extremity Assessment: Defer to PT  evaluation   Cervical / Trunk Assessment Cervical / Trunk Assessment: Kyphotic   Communication Communication Communication: Impaired Factors Affecting Communication: Hearing impaired   Cognition Arousal: Alert Behavior During Therapy: WFL for tasks assessed/performed Cognition:  (difficulty to tell cognition deficits versus difficulty following commands due to hearing issues.)             OT - Cognition Comments: Mild difficulty following commands for visual assessment. Much cuing to follow other commands.                 Following commands: Impaired Following commands impaired: Follows one step commands inconsistently     Cueing  General Comments   Cueing Techniques: Verbal cues;Gestural cues;Tactile cues                 Home Living Family/patient expects to be discharged to:: Private residence Living Arrangements: Alone;Other (Comment) Available Help at Discharge: Family;Available 24 hours/day Type of Home: House Home Access: Stairs to enter [employer] of Steps: 1 Entrance Stairs-Rails: None Home Layout: Other (Comment) (attic room that the pt does not use)     Bathroom Shower/Tub: Chief Strategy Officer: Standard Bathroom Accessibility: Yes How Accessible: Accessible via walker Home Equipment: Rolling Walker (2 wheels);BSC/3in1   Additional Comments: Pt lives beside family.  Lives With: Alone    Prior Functioning/Environment Prior Level of Function : Independent/Modified Independent             Mobility Comments: using RW in the morning but not all day for ambulation ADLs Comments: Independent; drives    OT Problem List: Decreased strength;Decreased activity tolerance;Impaired balance (  sitting and/or standing);Impaired vision/perception;Decreased coordination;Impaired tone;Impaired UE functional use   OT Treatment/Interventions: Self-care/ADL training;Therapeutic exercise;Therapeutic activities;Patient/family  education;Balance training;Visual/perceptual remediation/compensation;DME and/or AE instruction;Neuromuscular education      OT Goals(Current goals can be found in the care plan section)   Acute Rehab OT Goals Patient Stated Goal: improve function OT Goal Formulation: With patient/family Time For Goal Achievement: 11/05/23 Potential to Achieve Goals: Good   OT Frequency:  Min 2X/week    Co-evaluation PT/OT/SLP Co-Evaluation/Treatment: Yes Reason for Co-Treatment: To address functional/ADL transfers   OT goals addressed during session: ADL's and self-care      AM-PAC OT "6 Clicks" Daily Activity     Outcome Measure Help from another person eating meals?: A Little Help from another person taking care of personal grooming?: A Little Help from another person toileting, which includes using toliet, bedpan, or urinal?: A Little Help from another person bathing (including washing, rinsing, drying)?: A Little Help from another person to put on and taking off regular upper body clothing?: A Little Help from another person to put on and taking off regular lower body clothing?: A Little 6 Click Score: 18   End of Session Equipment Utilized During Treatment: Rolling walker (2 wheels);Gait belt  Activity Tolerance: Patient tolerated treatment well Patient left: in chair;with call bell/phone within reach;with family/visitor present  OT Visit Diagnosis: Unsteadiness on feet (R26.81);Other abnormalities of gait and mobility (R26.89);Muscle weakness (generalized) (M62.81);History of falling (Z91.81);Other symptoms and signs involving the nervous system (R29.898);Apraxia (R48.2);Hemiplegia and hemiparesis Hemiplegia - Right/Left: Left Hemiplegia - caused by: Cerebral infarction                Time: 4098-1191 OT Time Calculation (min): 21 min Charges:  OT General Charges $OT Visit: 1 Visit OT Evaluation $OT Eval Low Complexity: 1 Low  Cain Fitzhenry OT, MOT   Thurnell Floss 10/22/2023, 11:46 AM

## 2023-10-22 NOTE — Evaluation (Signed)
 Physical Therapy Evaluation Patient Details Name: Shelly Fields MRN: [id number] DOB: 1934-07-24 Today's Date: 10/22/2023  History of Present Illness  Shelly Fields is an 89 y.o. female with medical history significant of atrial fibrillation, hypothyroidism, T2DM who presents to the emergency department from home via EMS due to fall sustained at home around 10 PM last night.  Patient was unable to provide history, history was obtained from EDP and ED medical record.  Per report, patient lives alone and family checked on her this afternoon around 1:30 PM and was found on the floor.  Apparently, patient got out of bed and tried to use a walker, but she fell back onto a nightstand and had abrasions on back.  Family noted patient having left-sided weakness and dragging her left leg, so EMS was activated and patient was sent to the ED for further evaluation and management.   Clinical Impression  Patient presents with left side weakness, has difficulty sitting up, scooting to EOB, frequent leaning to the left, had to followed wit w/c during gait training for safety, and frequent drifts, leans to the left requiring Min/mod assist to avoid hitting wall, objects and losing balance. Patient tolerated sitting up in chair after therapy with son present. Patient will benefit from continued skilled physical therapy in hospital and recommended venue below to increase strength, balance, endurance for safe ADLs and gait.          If plan is discharge home, recommend the following: A lot of help with walking and/or transfers;A little help with bathing/dressing/bathroom;Help with stairs or ramp for entrance;Assistance with cooking/housework   Can travel by private vehicle        Equipment Recommendations None recommended by PT  Recommendations for Other Services       Functional Status Assessment Patient has had a recent decline in their functional status and demonstrates the ability to make significant  improvements in function in a reasonable and predictable amount of time.     Precautions / Restrictions Precautions Precautions: Fall Recall of Precautions/Restrictions: Intact Restrictions Weight Bearing Restrictions Per Provider Order: No      Mobility  Bed Mobility Overal bed mobility: Needs Assistance Bed Mobility: Supine to Sit     Supine to sit: Min assist     General bed mobility comments: increased time, labored movement    Transfers Overall transfer level: Needs assistance Equipment used: Rolling walker (2 wheels) Transfers: Sit to/from Stand, Bed to chair/wheelchair/BSC Sit to Stand: Mod assist, Min assist   Step pivot transfers: Mod assist       General transfer comment: leaning to left with LLE buckling    Ambulation/Gait Ambulation/Gait assistance: Min assist, Mod assist Gait Distance (Feet): 30 Feet Assistive device: Rolling walker (2 wheels) Gait Pattern/deviations: Decreased step length - right, Decreased step length - left, Decreased stride length, Antalgic, Knees buckling, Drifts right/left Gait velocity: decreased     General Gait Details: slow labored movement with frequent drifting/leaning to the left with buckling of LLE once fatigued  Stairs            Wheelchair Mobility     Tilt Bed    Modified Rankin (Stroke Patients Only)       Balance Overall balance assessment: Needs assistance Sitting-balance support: Feet supported, No upper extremity supported Sitting balance-Leahy Scale: Poor Sitting balance - Comments: seated at EOB Postural control: Left lateral lean Standing balance support: During functional activity, Bilateral upper extremity supported, Reliant on assistive device for balance Standing balance-Leahy Scale:  Poor Standing balance comment: using RW                             Pertinent Vitals/Pain Pain Assessment Pain Assessment: No/denies pain    Home Living Family/patient expects to be  discharged to:: Private residence Living Arrangements: Alone;Other (Comment) Available Help at Discharge: Family;Available 24 hours/day Type of Home: House Home Access: Stairs to enter Entrance Stairs-Rails: None Entrance Stairs-Number of Steps: 1   Home Layout: Other (Comment) Home Equipment: Rolling Walker (2 wheels);BSC/3in1 Additional Comments: Pt lives beside family.    Prior Function Prior Level of Function : Independent/Modified Independent             Mobility Comments: using RW in the morning but not all day for ambulation ADLs Comments: Independent; drives     Extremity/Trunk Assessment   Upper Extremity Assessment Upper Extremity Assessment: Defer to OT evaluation LUE Deficits / Details: 3-/5 shoulder flexion; 3+/5 elbow, wrist, and grasp. Poor fine and gross motor coordination. LUE Sensation: WNL LUE Coordination: decreased gross motor;decreased fine motor    Lower Extremity Assessment Lower Extremity Assessment: RLE deficits/detail;LLE deficits/detail RLE Deficits / Details: grossly -5/10 RLE Sensation: WNL RLE Coordination: WNL LLE Deficits / Details: grossly 3/5 LLE Sensation: WNL LLE Coordination: decreased fine motor;decreased gross motor    Cervical / Trunk Assessment Cervical / Trunk Assessment: Kyphotic  Communication   Communication Communication: Impaired Factors Affecting Communication: Hearing impaired    Cognition Arousal: Alert Behavior During Therapy: WFL for tasks assessed/performed   PT - Cognitive impairments: No apparent impairments                         Following commands: Impaired Following commands impaired: Follows one step commands inconsistently     Cueing Cueing Techniques: Verbal cues, Gestural cues, Tactile cues     General Comments      Exercises     Assessment/Plan    PT Assessment Patient needs continued PT services  PT Problem List Decreased strength;Decreased activity tolerance;Decreased  mobility;Decreased balance;Decreased coordination       PT Treatment Interventions DME instruction;Gait training;Stair training;Functional mobility training;Therapeutic activities;Therapeutic exercise;Balance training;Patient/family education;Neuromuscular re-education    PT Goals (Current goals can be found in the Care Plan section)  Acute Rehab PT Goals Patient Stated Goal: return home after rehab PT Goal Formulation: With patient/family Time For Goal Achievement: 11/05/23 Potential to Achieve Goals: Good    Frequency Min 4X/week     Co-evaluation PT/OT/SLP Co-Evaluation/Treatment: Yes Reason for Co-Treatment: To address functional/ADL transfers PT goals addressed during session: Mobility/safety with mobility;Balance;Proper use of DME OT goals addressed during session: ADL's and self-care       AM-PAC PT "6 Clicks" Mobility  Outcome Measure Help needed turning from your back to your side while in a flat bed without using bedrails?: A Little Help needed moving from lying on your back to sitting on the side of a flat bed without using bedrails?: A Little Help needed moving to and from a bed to a chair (including a wheelchair)?: A Lot Help needed standing up from a chair using your arms (e.g., wheelchair or bedside chair)?: A Lot Help needed to walk in hospital room?: A Lot Help needed climbing 3-5 steps with a railing? : A Lot 6 Click Score: 14    End of Session   Activity Tolerance: Patient tolerated treatment well;Patient limited by fatigue Patient left: in chair;with call bell/phone within  reach;with family/visitor present;with chair alarm set Nurse Communication: Mobility status PT Visit Diagnosis: Unsteadiness on feet (R26.81);Other abnormalities of gait and mobility (R26.89);Muscle weakness (generalized) (M62.81)    Time: 1610-9604 PT Time Calculation (min) (ACUTE ONLY): 30 min   Charges:   PT Evaluation $PT Eval Moderate Complexity: 1 Mod PT  Treatments $Therapeutic Activity: 23-37 mins PT General Charges $$ ACUTE PT VISIT: 1 Visit         1:43 PM, 10/22/23 Walton Guppy, MPT Physical Therapist with [facility] [phone] office [phone] mobile phone

## 2023-10-22 NOTE — Consult Note (Signed)
 NEUROLOGY TELECONSULTATION NOTE   Date of service: Oct 22, 2023 Patient Name: Shelly Fields MRN:  [id number] DOB:  Oct 11, 1934 Reason for consult: acute ischemic stroke  Requesting Provider: Dr. Doreene Gammon Consult Participants: myself, bedside RN, patient and her daughter Location of the provider: [facility], [place] Location of the patient: [facility]  This consult was provided via telemedicine with 2-way video and audio communication. The patient/family was informed that care would be provided in this way and agreed to receive care in this manner.   _ _ _   _ __   _ __ _ _  __ __   _ __   __ _  History of Present Illness   This is an 89 year old patient with a history of atrial fibrillation off anticoagulation due to recent concern for blood loss anemia, hypertension, diabetes who presented to the emergency department from home after a fall at home.  The fall is favored to happen around 10 PM on March 14 but patient was not found until 1:30 PM yesterday.  Patient got out of bed and tried to use a walker but she fell back onto her nightstand.  When family found her she was noted to have left-sided weakness and was dragging her left leg so EMS was activated.  MRI brain is pending.  CTA head and neck showed occlusion of the distal M2 branch of the right MCA. She was recently admitted for acute GIB 09/22/23 and required transfusion at that time 3 units. Her xarelto  was discontinued that admission. Prior endoscopy and colonoscopy Oct 2024 showed gastritis, H. Pylori, diverticulosis, hemorrhoids. She has f/u scheduled with GI.   ROS   Per HPI; all other systems reviewed and are negative  Past History   The following was personally reviewed:  Past Medical History:  Diagnosis Date   Cor pulmonale (chronic) (HCC)    Diverticulosis    Essential hypertension    Hypothyroidism    Internal hemorrhoids    Persistent atrial fibrillation (HCC)    Pulmonary hypertension (HCC)    Seasonal allergies     Shingles    Past Surgical History:  Procedure Laterality Date   ABDOMINAL HYSTERECTOMY     BIOPSY  03/25/2023   Procedure: BIOPSY;  Surgeon: Hargis Lias, MD;  Location: [facility];  Service: Endoscopy;;   CATARACT EXTRACTION     COLONOSCOPY N/A 01/12/2013   Procedure: COLONOSCOPY;  Surgeon: Ruby Corporal, MD;  Location: [facility];  Service: Endoscopy;  Laterality: N/A;  1030   COLONOSCOPY N/A 11/18/2017   Procedure: COLONOSCOPY;  Surgeon: Ruby Corporal, MD;  Location: [facility];  Service: Endoscopy;  Laterality: N/A;   COLONOSCOPY WITH PROPOFOL  N/A 03/25/2023   Procedure: COLONOSCOPY WITH PROPOFOL ;  Surgeon: Hargis Lias, MD;  Location: [facility];  Service: Endoscopy;  Laterality: N/A;  11:00AM;ASA 3   ESOPHAGOGASTRODUODENOSCOPY (EGD) WITH PROPOFOL  N/A 03/25/2023   Procedure: ESOPHAGOGASTRODUODENOSCOPY (EGD) WITH PROPOFOL ;  Surgeon: Hargis Lias, MD;  Location: [facility];  Service: Endoscopy;  Laterality: N/A;  11:00;ASA 3   LESION REMOVAL Right 09/13/2023   Procedure: excision of right lower extremity squamous cell carcinoma;  Surgeon: Marijo Shove, DO;  Location: [facility];  Service: General;  Laterality: Right;   POLYPECTOMY  11/18/2017   Procedure: POLYPECTOMY;  Surgeon: Ruby Corporal, MD;  Location: [facility];  Service: Endoscopy;;  colon   Family History  Problem Relation Age of Onset   Heart disease Mother  Hypertension Father    Colon cancer Neg Hx    Social History   Socioeconomic History   Marital status: Widowed    Spouse name: Not on file   Number of children: Not on file   Years of education: Not on file   Highest education level: Not on file  Occupational History   Not on file  Tobacco Use   Smoking status: Never    Passive exposure: Past   Smokeless tobacco: Never  Vaping Use   Vaping status: Never Used  Substance and Sexual Activity   Alcohol use: No    Alcohol/week: 0.0 standard drinks of alcohol   Drug  use: No   Sexual activity: Not on file  Other Topics Concern   Not on file  Social History Narrative   Not on file   Social Drivers of Health   Financial Resource Strain: Not on file  Food Insecurity: No Food Insecurity (10/21/2023)   Hunger Vital Sign    Worried About Running Out of Food in the Last Year: Never true    Ran Out of Food in the Last Year: Never true  Transportation Needs: No Transportation Needs (10/21/2023)   PRAPARE - Administrator, Civil Service (Medical): No    Lack of Transportation (Non-Medical): No  Physical Activity: Not on file  Stress: Not on file  Social Connections: Moderately Integrated (10/21/2023)   Social Connection and Isolation Panel [NHANES]    Frequency of Communication with Friends and Family: More than three times a week    Frequency of Social Gatherings with Friends and Family: More than three times a week    Attends Religious Services: 1 to 4 times per year    Active Member of [employer] or Organizations: Patient declined    Attends Banker Meetings: 1 to 4 times per year    Marital Status: Widowed   No Known Allergies  Medications   Medications Prior to Admission  Medication Sig Dispense Refill Last Dose/Taking   ALPRAZolam  (XANAX ) 0.25 MG tablet Take 0.125-0.25 mg by mouth daily as needed for anxiety or sleep.   10/20/2023 Bedtime   Carboxymethylcellulose Sodium (THERATEARS OP) Place 1 drop into both eyes daily.    10/21/2023 Morning   dapagliflozin  propanediol (FARXIGA ) 10 MG TABS tablet Take 1 tablet (10 mg total) by mouth daily before breakfast. 30 tablet 11 10/21/2023 Noon   denosumab  (PROLIA ) 60 MG/ML SOSY injection Inject 60 mg into the skin every 6 (six) months.   Past Month   docusate sodium  (COLACE) 100 MG capsule Take 1 capsule (100 mg total) by mouth 2 (two) times daily. 60 capsule 2 10/21/2023 Noon   ferrous gluconate  (FERGON) 324 MG tablet Take 1 tablet (324 mg total) by mouth every other day.   10/21/2023 Noon    furosemide  (LASIX ) 40 MG tablet Take 40 mg Daily alternating with 60 mg Daily (Patient taking differently: Take 40-60 mg by mouth daily. Take 40 mg Daily alternating with 60 mg Daily)   10/21/2023 Noon   levothyroxine  (SYNTHROID ) 75 MCG tablet Take 75 mcg by mouth daily before breakfast.   10/21/2023 Morning   metoprolol  succinate (TOPROL -XL) 50 MG 24 hr tablet TAKE 1 AND 1/2 TABLETS BY MOUTH  TWICE DAILY 300 tablet 2 10/21/2023 Noon   potassium chloride  SA (KLOR-CON  M) 20 MEQ tablet Take 1 tablet (20 mEq total) by mouth daily. 90 tablet 1 10/20/2023 Morning   psyllium (METAMUCIL) 58.6 % packet Take 1 packet by mouth 2 (  two) times daily. (Patient taking differently: Take 1 packet by mouth at bedtime.) 30 each 12 10/20/2023 Bedtime      Current Facility-Administered Medications:    acetaminophen  (TYLENOL ) tablet 650 mg, 650 mg, Oral, Q6H PRN **OR** acetaminophen  (TYLENOL ) suppository 650 mg, 650 mg, Rectal, Q6H PRN, Adefeso, Oladapo, DO   ALPRAZolam  (XANAX ) tablet 0.125-0.25 mg, 0.125-0.25 mg, Oral, Daily PRN, Adefeso, Oladapo, DO   aspirin EC tablet 81 mg, 81 mg, Oral, Daily, Mason Sole, Pratik D, DO, 81 mg at 10/22/23 5784   clopidogrel (PLAVIX) tablet 75 mg, 75 mg, Oral, Daily, Mason Sole, Pratik D, DO, 75 mg at 10/22/23 6962   docusate sodium  (COLACE) capsule 100 mg, 100 mg, Oral, BID, Adefeso, Oladapo, DO, 100 mg at 10/22/23 0959   insulin aspart (novoLOG) injection 0-9 Units, 0-9 Units, Subcutaneous, TID WC, Adefeso, Oladapo, DO   levothyroxine  (SYNTHROID ) tablet 75 mcg, 75 mcg, Oral, Q0600, Adefeso, Oladapo, DO, 75 mcg at 10/22/23 0553   melatonin tablet 3 mg, 3 mg, Oral, QHS PRN, Adefeso, Oladapo, DO, 3 mg at 10/22/23 0031   ondansetron  (ZOFRAN ) tablet 4 mg, 4 mg, Oral, Q6H PRN **OR** ondansetron  (ZOFRAN ) injection 4 mg, 4 mg, Intravenous, Q6H PRN, Adefeso, Oladapo, DO   psyllium (HYDROCIL/METAMUCIL) 1 packet, 1 packet, Oral, BID, Adefeso, Oladapo, DO, 1 packet at 10/22/23 0959  Vitals   Vitals:    10/22/23 0748 10/22/23 0951 10/22/23 1136 10/22/23 1500  BP: 132/67 137/71 (!) 148/66 131/64  Pulse: 72 76 64 73  Resp: 16 18 17 16   Temp: 98.3 F (36.8 C) (!) 97.5 F (36.4 C) 97.6 F (36.4 C) 98 F (36.7 C)  TempSrc: Oral Oral Oral Oral  SpO2: 100% 97% 90% 100%  Weight:      Height:         Body mass index is 16.29 kg/m.  Physical Exam   Exam performed over telemedicine with 2-way video and audio communication and with assistance of bedside RN  Physical Exam Gen: A&O x4, NAD Resp: normal WOB CV: extremities appear well-perfused  Neuro: *MS: A&O x4. Follows multi-step commands.  *Speech: mildly dysarthric, no aphasia, able to name and repeat *CN: PERRL 3mm, EOMI, VFF by confrontation, sensation intact, L lower facial droop, hearing intact to voice *Motor:   Normal bulk.  No tremor, rigidity or bradykinesia. No pronator drift. Drift in LUE and LLE but not to bed. RUE and RLE no drift. *Sensory: SILT. Symmetric. No double-simultaneous extinction.  *Coordination:  FNF intact on R *Reflexes:  UTA 2/2 tele-exam *Gait: deferred  NIHSS  1a Level of Conscious.: 0 1b LOC Questions: 0 1c LOC Commands: 0 2 Best Gaze: 0 3 Visual: 0 4 Facial Palsy: 1 5a Motor Arm - left: 1 5b Motor Arm - Right: 0 6a Motor Leg - Left: 1 6b Motor Leg - Right: 0 7 Limb Ataxia: 0 8 Sensory: 0 9 Best Language: 0 10 Dysarthria: 1 11 Extinct. and Inatten.: 0  TOTAL: 4   Premorbid mRS = 2   Labs   CBC:  Recent Labs  Lab 10/21/23 1854 10/22/23 0443  WBC 7.0 7.7  NEUTROABS 5.5  --   HGB 11.6* 10.2*  HCT 37.3 33.8*  MCV 89.4 88.3  PLT 241 210    Basic Metabolic Panel:  Lab Results  Component Value Date   NA 133 (L) 10/22/2023   K 3.1 (L) 10/22/2023   CO2 22 10/22/2023   GLUCOSE 92 10/22/2023   BUN 16 10/22/2023   CREATININE 0.86 10/22/2023  CALCIUM 8.5 (L) 10/22/2023   GFRNONAA >60 10/22/2023   Lipid Panel:  Lab Results  Component Value Date   LDLCALC 59 10/22/2023    HgbA1c:  Lab Results  Component Value Date   HGBA1C 5.2 10/21/2023   Urine Drug Screen: No results found for: "LABOPIA", "COCAINSCRNUR", "LABBENZ", "AMPHETMU", "THCU", "LABBARB"  Alcohol Level No results found for: "ETH"  CT angio Head and Neck with contrast: 1. Occlusion of a distal M2 branch of the right MCA. 2. No hemodynamically significant stenosis of the carotid arteries. 3. Mild stenosis of the left vertebral artery origin due to atherosclerotic calcification.  TTE no intracardiac clot identified  Impression   This is an 89 year old patient with a history of atrial fibrillation off anticoagulation x6 wks in the setting of GIB requiring transfusion, hypertension, diabetes who presented to the emergency department with L sided weakness (face/arm/leg) and dysarthria. MRI is pending however based on history and exam findings are c/w R MCA acute ischemic stroke. Etiology is favored to be cardioembolic in the setting of discontinuation of anticoagulation after GIB. Unfortunately given the severity of her acute blood loss anemia last admission in April it is not safe to start her on anticoagulation. If GI does further workup, identifies the culprit lesion, and treats it definitively then [facility] may be considered at that time.   Recommendations   - Permissive HTN x48 hrs from sx onset goal BP <220/110. PRN labetalol or hydralazine if BP above these parameters. Avoid oral antihypertensives. After 48 hours, goal normotension, avoid hypotension - MRI brain wo contrast - No indication for statin LDL is at goal - Unable to start anticoagulation for a fib in the setting of recent GIB. If GI does further workup, identifies the culprit lesion, and treats it definitively then [facility] may be considered at that time. In the meantime recommend ASA 81mg  daily + plavix 75mg  daily x90 days f/b ASA 81mg  daily monotherapy after that. If patient is put on therapeutic anticoagulation in the future, antiplatelets may  be discontinued at that time (from neuro standpoint) - q4 hr neuro checks - STAT head CT for any change in neuro exam - Tele - PT/OT/SLP - Stroke education - Amb referral to neurology upon discharge   ______________________________________________________________________   Thank you for the opportunity to take part in the care of this patient. If you have any further questions, please contact the neurology consultation attending.  Signed,  Greg Leaks, MD [facility] [phone]  If 7pm- 7am, please page neurology on call as listed in AMION.  **Any copied and pasted documentation in this note was written by me in another application not billed for and pasted by me into this document.

## 2023-10-23 DIAGNOSIS — I482 Chronic atrial fibrillation, unspecified: Secondary | ICD-10-CM | POA: Diagnosis not present

## 2023-10-23 DIAGNOSIS — D509 Iron deficiency anemia, unspecified: Secondary | ICD-10-CM | POA: Diagnosis not present

## 2023-10-23 DIAGNOSIS — I639 Cerebral infarction, unspecified: Secondary | ICD-10-CM | POA: Diagnosis not present

## 2023-10-23 DIAGNOSIS — R29810 Facial weakness: Secondary | ICD-10-CM | POA: Diagnosis present

## 2023-10-23 DIAGNOSIS — G8194 Hemiplegia, unspecified affecting left nondominant side: Secondary | ICD-10-CM | POA: Diagnosis present

## 2023-10-23 DIAGNOSIS — E871 Hypo-osmolality and hyponatremia: Secondary | ICD-10-CM | POA: Diagnosis not present

## 2023-10-23 DIAGNOSIS — Z8249 Family history of ischemic heart disease and other diseases of the circulatory system: Secondary | ICD-10-CM | POA: Diagnosis not present

## 2023-10-23 DIAGNOSIS — D500 Iron deficiency anemia secondary to blood loss (chronic): Secondary | ICD-10-CM | POA: Diagnosis not present

## 2023-10-23 DIAGNOSIS — I110 Hypertensive heart disease with heart failure: Secondary | ICD-10-CM | POA: Diagnosis not present

## 2023-10-23 DIAGNOSIS — E039 Hypothyroidism, unspecified: Secondary | ICD-10-CM | POA: Diagnosis not present

## 2023-10-23 DIAGNOSIS — K2970 Gastritis, unspecified, without bleeding: Secondary | ICD-10-CM | POA: Diagnosis not present

## 2023-10-23 DIAGNOSIS — E119 Type 2 diabetes mellitus without complications: Secondary | ICD-10-CM | POA: Diagnosis not present

## 2023-10-23 DIAGNOSIS — Z79890 Hormone replacement therapy (postmenopausal): Secondary | ICD-10-CM | POA: Diagnosis not present

## 2023-10-23 DIAGNOSIS — I5032 Chronic diastolic (congestive) heart failure: Secondary | ICD-10-CM | POA: Diagnosis not present

## 2023-10-23 DIAGNOSIS — R29704 NIHSS score 4: Secondary | ICD-10-CM | POA: Diagnosis present

## 2023-10-23 DIAGNOSIS — W06XXXA Fall from bed, initial encounter: Secondary | ICD-10-CM | POA: Diagnosis present

## 2023-10-23 DIAGNOSIS — K31819 Angiodysplasia of stomach and duodenum without bleeding: Secondary | ICD-10-CM | POA: Diagnosis not present

## 2023-10-23 DIAGNOSIS — K922 Gastrointestinal hemorrhage, unspecified: Secondary | ICD-10-CM | POA: Diagnosis not present

## 2023-10-23 DIAGNOSIS — D62 Acute posthemorrhagic anemia: Secondary | ICD-10-CM | POA: Diagnosis not present

## 2023-10-23 DIAGNOSIS — K5900 Constipation, unspecified: Secondary | ICD-10-CM | POA: Diagnosis not present

## 2023-10-23 DIAGNOSIS — Y92009 Unspecified place in unspecified non-institutional (private) residence as the place of occurrence of the external cause: Secondary | ICD-10-CM | POA: Diagnosis not present

## 2023-10-23 DIAGNOSIS — Z7982 Long term (current) use of aspirin: Secondary | ICD-10-CM | POA: Diagnosis not present

## 2023-10-23 DIAGNOSIS — I509 Heart failure, unspecified: Secondary | ICD-10-CM | POA: Diagnosis present

## 2023-10-23 DIAGNOSIS — K2951 Unspecified chronic gastritis with bleeding: Secondary | ICD-10-CM | POA: Diagnosis not present

## 2023-10-23 DIAGNOSIS — I4819 Other persistent atrial fibrillation: Secondary | ICD-10-CM | POA: Diagnosis present

## 2023-10-23 DIAGNOSIS — Z79899 Other long term (current) drug therapy: Secondary | ICD-10-CM | POA: Diagnosis not present

## 2023-10-23 DIAGNOSIS — T18198A Other foreign object in esophagus causing other injury, initial encounter: Secondary | ICD-10-CM | POA: Diagnosis not present

## 2023-10-23 DIAGNOSIS — Z7902 Long term (current) use of antithrombotics/antiplatelets: Secondary | ICD-10-CM | POA: Diagnosis not present

## 2023-10-23 DIAGNOSIS — F419 Anxiety disorder, unspecified: Secondary | ICD-10-CM | POA: Diagnosis present

## 2023-10-23 DIAGNOSIS — K222 Esophageal obstruction: Secondary | ICD-10-CM | POA: Diagnosis not present

## 2023-10-23 DIAGNOSIS — I071 Rheumatic tricuspid insufficiency: Secondary | ICD-10-CM | POA: Diagnosis not present

## 2023-10-23 DIAGNOSIS — Z7901 Long term (current) use of anticoagulants: Secondary | ICD-10-CM | POA: Diagnosis not present

## 2023-10-23 DIAGNOSIS — I10 Essential (primary) hypertension: Secondary | ICD-10-CM | POA: Diagnosis not present

## 2023-10-23 DIAGNOSIS — Z66 Do not resuscitate: Secondary | ICD-10-CM | POA: Diagnosis present

## 2023-10-23 DIAGNOSIS — R4781 Slurred speech: Secondary | ICD-10-CM | POA: Diagnosis present

## 2023-10-23 DIAGNOSIS — K644 Residual hemorrhoidal skin tags: Secondary | ICD-10-CM | POA: Diagnosis not present

## 2023-10-23 DIAGNOSIS — I2781 Cor pulmonale (chronic): Secondary | ICD-10-CM | POA: Diagnosis not present

## 2023-10-23 DIAGNOSIS — G4700 Insomnia, unspecified: Secondary | ICD-10-CM | POA: Diagnosis present

## 2023-10-23 DIAGNOSIS — E876 Hypokalemia: Secondary | ICD-10-CM | POA: Diagnosis not present

## 2023-10-23 DIAGNOSIS — I69398 Other sequelae of cerebral infarction: Secondary | ICD-10-CM | POA: Diagnosis not present

## 2023-10-23 DIAGNOSIS — K449 Diaphragmatic hernia without obstruction or gangrene: Secondary | ICD-10-CM | POA: Diagnosis not present

## 2023-10-23 DIAGNOSIS — K648 Other hemorrhoids: Secondary | ICD-10-CM | POA: Diagnosis not present

## 2023-10-23 DIAGNOSIS — I2729 Other secondary pulmonary hypertension: Secondary | ICD-10-CM | POA: Diagnosis not present

## 2023-10-23 DIAGNOSIS — Z79620 Long term (current) use of immunosuppressive biologic: Secondary | ICD-10-CM | POA: Diagnosis not present

## 2023-10-23 DIAGNOSIS — K31811 Angiodysplasia of stomach and duodenum with bleeding: Secondary | ICD-10-CM | POA: Diagnosis not present

## 2023-10-23 DIAGNOSIS — I4821 Permanent atrial fibrillation: Secondary | ICD-10-CM | POA: Diagnosis not present

## 2023-10-23 DIAGNOSIS — I63511 Cerebral infarction due to unspecified occlusion or stenosis of right middle cerebral artery: Secondary | ICD-10-CM | POA: Diagnosis not present

## 2023-10-23 LAB — GLUCOSE, CAPILLARY
Glucose-Capillary: 102 mg/dL — ABNORMAL HIGH (ref 70–99)
Glucose-Capillary: 120 mg/dL — ABNORMAL HIGH (ref 70–99)
Glucose-Capillary: 125 mg/dL — ABNORMAL HIGH (ref 70–99)
Glucose-Capillary: 135 mg/dL — ABNORMAL HIGH (ref 70–99)

## 2023-10-23 LAB — BASIC METABOLIC PANEL WITH GFR
Anion gap: 9 (ref 5–15)
BUN: 12 mg/dL (ref 8–23)
CO2: 22 mmol/L (ref 22–32)
Calcium: 8.5 mg/dL — ABNORMAL LOW (ref 8.9–10.3)
Chloride: 100 mmol/L (ref 98–111)
Creatinine, Ser: 0.61 mg/dL (ref 0.44–1.00)
GFR, Estimated: >60 mL/min (ref >60)
Glucose, Bld: 83 mg/dL (ref 70–99)
Potassium: 3.6 mmol/L (ref 3.5–5.1)
Sodium: 131 mmol/L — ABNORMAL LOW (ref 135–145)

## 2023-10-23 LAB — CBC
HCT: 36.1 % (ref 36.0–46.0)
Hemoglobin: 11 g/dL — ABNORMAL LOW (ref 12.0–15.0)
MCH: 26.8 pg (ref 26.0–34.0)
MCHC: 30.5 g/dL (ref 30.0–36.0)
MCV: 88 fL (ref 80.0–100.0)
Platelets: 218 10*3/uL (ref 150–400)
RBC: 4.1 MIL/uL (ref 3.87–5.11)
RDW: 15.9 % — ABNORMAL HIGH (ref 11.5–15.5)
WBC: 8.4 10*3/uL (ref 4.0–10.5)
nRBC: 0 % (ref 0.0–0.2)

## 2023-10-23 LAB — MAGNESIUM: Magnesium: 2 mg/dL (ref 1.7–2.4)

## 2023-10-23 NOTE — Plan of Care (Signed)
   Problem: Education: Goal: Knowledge of General Education information will improve Description Including pain rating scale, medication(s)/side effects and non-pharmacologic comfort measures Outcome: Progressing

## 2023-10-23 NOTE — Progress Notes (Signed)
 Inpatient Rehab Admissions:  Inpatient Rehab Consult received.  NSG informed [facility] that pt is HOH and recommended talking to pt's family. Pt's granddaughter was in the room and recommended pt's son Shelly Fields be contacted. I spoke with pt's son Shelly Fields on the telephone for rehabilitation assessment and to discuss goals and expectations of an inpatient rehab admission.  Discussed average length of stay, insurance authorization requirement and discharge home after completion of CIR. Shelly Fields and his wife acknowledged understanding. They are supportive of pt pursuing CIR. They confirmed that family will be able to provide 24/7 support for pt after discharge. Will continue to follow.  Signed: Artemus Larsen, MS, CCC-SLP Admissions Coordinator [phone]

## 2023-10-23 NOTE — Progress Notes (Signed)
 PROGRESS NOTE    Shelly Fields  JXB:[id number] DOB: June 06, 1935 DOA: 10/21/2023 PCP: Orlena Bitters, MD   Brief Narrative:    Shelly Fields is an 89 y.o. female with medical history significant of atrial fibrillation, hypothyroidism, T2DM who presents to the emergency department from home via EMS due to fall sustained at home at night and she was found the following day in the afternoon by family members.  She was noted to have significant left-sided weakness especially to her left leg.  She was admitted for evaluation of acute ischemic CVA.  Brain MRI demonstrates right MCA CVA and neurology recommending DAPT x 90 days and then aspirin  monotherapy after.  Assessment & Plan:   Principal Problem:   Acute ischemic stroke [facility]) Active Problems:   Hypothyroidism, acquired   Persistent atrial fibrillation (HCC)   Constipation   Controlled type 2 diabetes mellitus without complication, without long-term current use of insulin  (HCC)   Anxiety   Insomnia   Fall at home, initial encounter   Abrasion of back  Assessment and Plan:   Acute right MCA distribution CVA CT angiography head and neck with and without contrast showed occlusion of a distal M2 branch of the right MCA Brain MRI confirms ischemic CVA Echocardiogram with findings of mild LVH and no other significant findings Aspirin  325 mg and Plavix  300 mg p.o. x 1 was given per neurologist recommendation and now started on aspirin  and Plavix  Continue fall precautions and neuro checks Lipid panel, LDL 59 and hemoglobin A1c 1.3% PT recommending CIR and she is currently awaiting placement Appreciate neurology evaluation Recommend to start anticoagulation in the future once further evaluated by GI   Fall at home Patient was on the floor for several hours Total CK within normal limits Continue fall precaution   Abrasion of back Continue wound care   Persistent atrial fibrillation Metoprolol  will be temporarily held at this time  due to need for permissive hypertension Patient is not on any blood thinner   Acquired hypothyroidism Continue Synthroid    Type 2 diabetes mellitus without complication Continue ISS and hypoglycemia protocol   Anxiety Continue Xanax  per home regimen   Constipation Continue Colace and psyllium per home regimen   Insomnia Continue melatonin     DVT prophylaxis: SCDs Code Status: DNR Family Communication: Granddaughter at bedside 5/17 Disposition Plan:  Status is: Observation The patient will require care spanning > 2 midnights and should be moved to inpatient because: Need for placement to CIR.   Consultants:  Neurology  Procedures:  None  Antimicrobials:  None   Subjective: Patient seen and evaluated today with no new acute complaints or concerns. No acute concerns or events noted overnight.  Objective: Vitals:   10/22/23 2359 10/23/23 0354 10/23/23 0818 10/23/23 1216  BP: 129/76 132/68 (!) 154/72 134/77  Pulse: 86 86 99 93  Resp: 18 18 (!) 21 19  Temp: 98.3 F (36.8 C) 98.2 F (36.8 C) 99.1 F (37.3 C) 99.3 F (37.4 C)  TempSrc: Oral Oral Oral Oral  SpO2: 98% 97% 98% 100%  Weight:      Height:        Intake/Output Summary (Last 24 hours) at 10/23/2023 1509 Last data filed at 10/23/2023 1300 Gross per 24 hour  Intake 1160 ml  Output --  Net 1160 ml   Filed Weights   10/21/23 2300  Weight: 40.4 kg    Examination:  General exam: Appears calm and comfortable, elderly/frail Respiratory system: Clear to auscultation. Respiratory  effort normal. Cardiovascular system: S1 & S2 heard, RRR.  Gastrointestinal system: Abdomen is soft Central nervous system: Alert and awake Extremities: No edema Skin: No significant lesions noted Psychiatry: Flat affect.    Data Reviewed: I have personally reviewed following labs and imaging studies  CBC: Recent Labs  Lab 10/21/23 1854 10/22/23 0443 10/23/23 0407  WBC 7.0 7.7 8.4  NEUTROABS 5.5  --   --    HGB 11.6* 10.2* 11.0*  HCT 37.3 33.8* 36.1  MCV 89.4 88.3 88.0  PLT 241 210 218   Basic Metabolic Panel: Recent Labs  Lab 10/21/23 1854 10/22/23 0443 10/23/23 0407  NA 132* 133* 131*  K 3.7 3.1* 3.6  CL 98 103 100  CO2 24 22 22   GLUCOSE 109* 92 83  BUN 18 16 12   CREATININE 0.75 0.86 0.61  CALCIUM 9.2 8.5* 8.5*  MG  --  1.9 2.0  PHOS  --  3.1  --    GFR: Estimated Creatinine Clearance: 32.2 mL/min (by C-G formula based on SCr of 0.61 mg/dL). Liver Function Tests: Recent Labs  Lab 10/21/23 1854 10/22/23 0443  AST 25 17  ALT 16 15  ALKPHOS 104 87  BILITOT 0.9 0.8  PROT 7.5 6.3*  ALBUMIN  3.6 3.1*   No results for input(s): "LIPASE", "AMYLASE" in the last 168 hours. No results for input(s): "AMMONIA" in the last 168 hours. Coagulation Profile: Recent Labs  Lab 10/21/23 1927  INR 1.1   Cardiac Enzymes: Recent Labs  Lab 10/21/23 1854 10/22/23 0443  CKTOTAL 69 50   BNP (last 3 results) No results for input(s): "PROBNP" in the last 8760 hours. HbA1C: Recent Labs    10/21/23 1854  HGBA1C 5.2   CBG: Recent Labs  Lab 10/22/23 1108 10/22/23 1644 10/22/23 2021 10/23/23 0717 10/23/23 1150  GLUCAP 86 91 105* 102* 120*   Lipid Profile: Recent Labs    10/22/23 0443  CHOL 112  HDL 44  LDLCALC 59  TRIG 43  CHOLHDL 2.5   Thyroid  Function Tests: No results for input(s): "TSH", "T4TOTAL", "FREET4", "T3FREE", "THYROIDAB" in the last 72 hours. Anemia Panel: No results for input(s): "VITAMINB12", "FOLATE", "FERRITIN", "TIBC", "IRON", "RETICCTPCT" in the last 72 hours. Sepsis Labs: No results for input(s): "PROCALCITON", "LATICACIDVEN" in the last 168 hours.  No results found for this or any previous visit (from the past 240 hours).       Radiology Studies: MR BRAIN WO CONTRAST Result Date: 10/22/2023 CLINICAL DATA:  Neuro deficit, acute, stroke suspected EXAM: MRI HEAD WITHOUT CONTRAST TECHNIQUE: Multiplanar, multiecho pulse sequences of the brain  and surrounding structures were obtained without intravenous contrast. COMPARISON:  CTA head/neck Oct 21, 2023. FINDINGS: Brain: Acute right MCA territory infarcts involving the right insula and overlying frontal and parietal lobes. Associated edema without substantial mass effect. No midline shift or acute hemorrhage. No mass lesion or hydrocephalus. Patchy T2/FLAIR hyperintensities the white matter compatible with chronic microvascular ischemic disease. Vascular: Major arterial flow voids are maintained skull base. Skull and upper cervical spine: Normal marrow signal. Sinuses/Orbits: Clear sinuses.  No acute orbital findings. Other: No mastoid effusions. IMPRESSION: Acute right MCA territory infarcts involving the right insula and overlying frontal and parietal lobes. No substantial mass effect. Electronically Signed   By: Stevenson Elbe M.D.   On: 10/22/2023 23:04   ECHOCARDIOGRAM LIMITED Result Date: 10/22/2023    ECHOCARDIOGRAM LIMITED REPORT   Patient Name:   Shelly Fields Date of Exam: 10/22/2023 Medical Rec #:  [id number]  Height:       62.0 in Accession #:    [id number]     Weight:       89.1 lb Date of Birth:  1935-02-16     BSA:          1.356 m Patient Age:    88 years       BP:           148/66 mmHg Patient Gender: F              HR:           73 bpm. Exam Location:  [facility] Procedure: Limited Echo, Cardiac Doppler and Color Doppler (Both Spectral and            Color Flow Doppler were utilized during procedure). Indications:    Stroke  History:        Patient has prior history of Echocardiogram examinations, most                 recent 09/17/2023. CHF, Abnormal ECG, Stroke, Arrythmias:Atrial                 Fibrillation, Signs/Symptoms:Syncope; Risk Factors:Diabetes.  Sonographer:    Raynelle Callow RDCS Referring Phys: [id number] OLADAPO ADEFESO IMPRESSIONS  1. There is mild left ventricular hypertrophy. Left ventricular diastolic parameters are indeterminate. There is the interventricular septum  is flattened in systole and diastole, consistent with right ventricular pressure and volume overload.  2. Right ventricular systolic function is moderately reduced. The right ventricular size is severely enlarged. There is severely elevated pulmonary artery systolic pressure. The estimated right ventricular systolic pressure is 42.2 mmHg.  3. Left atrial size was severely dilated.  4. Right atrial size was severely dilated.  5. The mitral valve is abnormal. Mild mitral valve regurgitation. No evidence of mitral stenosis.  6. The tricuspid valve is abnormal. Tricuspid valve regurgitation is severe.  7. The aortic valve is tricuspid. Aortic valve regurgitation is mild. No aortic stenosis is present.  8. The inferior vena cava is dilated in size with <50% respiratory variability, suggesting right atrial pressure of 15 mmHg.  9. Limited echo in setting of stroke FINDINGS  Left Ventricle: The left ventricular internal cavity size was normal in size. There is mild left ventricular hypertrophy. The interventricular septum is flattened in systole and diastole, consistent with right ventricular pressure and volume overload. Left ventricular diastolic parameters are indeterminate. Right Ventricle: The right ventricular size is severely enlarged. Right ventricular systolic function is moderately reduced. There is severely elevated pulmonary artery systolic pressure. The tricuspid regurgitant velocity is 2.61 m/s, and with an assumed right atrial pressure of 15 mmHg, the estimated right ventricular systolic pressure is 42.2 mmHg. Left Atrium: Left atrial size was severely dilated. Right Atrium: Right atrial size was severely dilated. Mitral Valve: The mitral valve is abnormal. Mild mitral valve regurgitation. No evidence of mitral valve stenosis. MV peak gradient, 6.6 mmHg. The mean mitral valve gradient is 2.0 mmHg. Tricuspid Valve: The tricuspid valve is abnormal. Tricuspid valve regurgitation is severe. No evidence of  tricuspid stenosis. Aortic Valve: The aortic valve is tricuspid. Aortic valve regurgitation is mild. No aortic stenosis is present. Aortic valve mean gradient measures 1.3 mmHg. Aortic valve peak gradient measures 1.8 mmHg. Aortic valve area, by VTI measures 3.02 cm. Aorta: The aortic root and ascending aorta are structurally normal, with no evidence of dilitation. Venous: The inferior vena cava is dilated in size with less than 50% respiratory variability,  suggesting right atrial pressure of 15 mmHg. IAS/Shunts: No atrial level shunt detected by color flow Doppler. LEFT VENTRICLE PLAX 2D LVIDd:         3.60 cm LVIDs:         2.36 cm LV PW:         1.10 cm LV IVS:        1.08 cm LVOT diam:     2.15 cm LV SV:         54 LV SV Index:   40 LVOT Area:     3.63 cm  LV Volumes (MOD) LV vol d, MOD A2C: 62.1 ml LV vol d, MOD A4C: 52.6 ml LV vol s, MOD A2C: 24.8 ml LV vol s, MOD A4C: 15.8 ml LV SV MOD A2C:     37.4 ml LV SV MOD A4C:     52.6 ml LV SV MOD BP:      36.8 ml IVC IVC diam: 3.50 cm LEFT ATRIUM             Index        RIGHT ATRIUM           Index LA Vol (A2C):   94.6 ml 69.79 ml/m  RA Area:     40.60 cm LA Vol (A4C):   68.9 ml 50.83 ml/m  RA Volume:   168.00 ml 123.94 ml/m LA Biplane Vol: 79.3 ml 58.50 ml/m  AORTIC VALVE AV Area (Vmax):    4.24 cm AV Area (Vmean):   2.90 cm AV Area (VTI):     3.02 cm AV Vmax:           66.37 cm/s AV Vmean:          55.426 cm/s AV VTI:            0.179 m AV Peak Grad:      1.8 mmHg AV Mean Grad:      1.3 mmHg LVOT Vmax:         77.43 cm/s LVOT Vmean:        44.211 cm/s LVOT VTI:          0.149 m LVOT/AV VTI ratio: 0.83  AORTA Ao Root diam: 3.30 cm Ao Asc diam:  3.40 cm MITRAL VALVE               TRICUSPID VALVE MV Area (PHT): 3.61 cm    TR Peak grad:   27.2 mmHg MV Peak grad:  6.6 mmHg    TR Vmax:        261.00 cm/s MV Mean grad:  2.0 mmHg MV Vmax:       1.28 m/s    SHUNTS MV Vmean:      63.9 cm/s   Systemic VTI:  0.15 m MV Decel Time: 210 msec    Systemic Diam: 2.15 cm  MV E velocity: 88.70 cm/s Armida Lander MD Electronically signed by Armida Lander MD Signature Date/Time: 10/22/2023/3:14:08 PM    Final    CT ANGIO HEAD NECK W WO CM Result Date: 10/21/2023 CLINICAL DATA:  Left-sided weakness EXAM: CT ANGIOGRAPHY HEAD AND NECK WITH AND WITHOUT CONTRAST TECHNIQUE: Multidetector CT imaging of the head and neck was performed using the standard protocol during bolus administration of intravenous contrast. Multiplanar CT image reconstructions and MIPs were obtained to evaluate the vascular anatomy. Carotid stenosis measurements (when applicable) are obtained utilizing NASCET criteria, using the distal internal carotid diameter as the denominator. RADIATION DOSE REDUCTION: This exam was performed according  to the departmental dose-optimization program which includes automated exposure control, adjustment of the mA and/or kV according to patient size and/or use of iterative reconstruction technique. CONTRAST:  75mL OMNIPAQUE  IOHEXOL  350 MG/ML SOLN COMPARISON:  None Available. FINDINGS: CT HEAD FINDINGS Brain: There is no mass, hemorrhage or extra-axial collection. The size and configuration of the ventricles and extra-axial CSF spaces are normal. There is hypoattenuation of the white matter, most commonly indicating chronic small vessel disease. Vascular: No hyperdense vessel or unexpected vascular calcification. Skull: The visualized skull base, calvarium and extracranial soft tissues are normal. Sinuses/Orbits: No fluid levels or advanced mucosal thickening of the visualized paranasal sinuses. No mastoid or middle ear effusion. Normal orbits. CTA NECK FINDINGS Skeleton: No acute abnormality or high grade bony spinal canal stenosis. Other neck: Normal pharynx, larynx and major salivary glands. No cervical lymphadenopathy. Unremarkable thyroid  gland. Upper chest: No pneumothorax or pleural effusion. No nodules or masses. Aortic arch: There is calcific atherosclerosis of the aortic  arch. Conventional 3 vessel aortic branching pattern. RIGHT carotid system: No dissection, occlusion or aneurysm. Mild atherosclerotic calcification at the carotid bifurcation without hemodynamically significant stenosis. LEFT carotid system: No dissection, occlusion or aneurysm. Mild atherosclerotic calcification at the carotid bifurcation without hemodynamically significant stenosis. Vertebral arteries: Codominant configuration. Mild stenosis of the left vertebral artery origin due to atherosclerotic calcification. Otherwise, the vertebral arteries are normal to the skull base. CTA HEAD FINDINGS POSTERIOR CIRCULATION: Vertebral arteries are normal. No proximal occlusion of the anterior or inferior cerebellar arteries. Basilar artery is normal. Superior cerebellar arteries are normal. Posterior cerebral arteries are normal. ANTERIOR CIRCULATION: Atherosclerotic calcification of the internal carotid arteries at the skull base without hemodynamically significant stenosis. Anterior cerebral arteries are normal. There is occlusion of a distal M2 branch of the right MCA (series 9, image 198). The middle cerebral arteries are otherwise normal. Venous sinuses: As permitted by contrast timing, patent. Anatomic variants: None Review of the MIP images confirms the above findings. IMPRESSION: 1. Occlusion of a distal M2 branch of the right MCA. 2. No hemodynamically significant stenosis of the carotid arteries. 3. Mild stenosis of the left vertebral artery origin due to atherosclerotic calcification. Aortic Atherosclerosis (ICD10-I70.0). Critical Value/emergent results were called by telephone at the time of interpretation on 10/21/2023 at 9:23 pm to provider [facility] , who verbally acknowledged these results. Electronically Signed   By: Juanetta Nordmann M.D.   On: 10/21/2023 21:23     Scheduled Meds:  aspirin  EC  81 mg Oral Daily   clopidogrel   75 mg Oral Daily   docusate sodium   100 mg Oral BID   insulin  aspart   0-9 Units Subcutaneous TID WC   levothyroxine   75 mcg Oral Q0600   psyllium  1 packet Oral BID     LOS: 0 days    Time spent: 55 minutes    Kody Brandl Loran Rock, DO [facility]  If 7PM-7AM, please contact night-coverage [email or web address] 10/23/2023, 3:09 PM

## 2023-10-23 NOTE — Plan of Care (Signed)

## 2023-10-23 NOTE — PMR Pre-admission (Signed)
 PMR Admission Coordinator Pre-Admission Assessment  Patient: Shelly Fields is an 89 y.o., female MRN: [id number] DOB: 1935-05-26 Height: 5\' 2"  (157.5 cm) Weight: 40.4 kg  Insurance Information HMO: yes    PPO:      PCP:      IPA:      80/20:      OTHER:  PRIMARY: [employer] Medicare      Policy#: [id number]      Subscriber: patient CM Name: Genene Kennel      Phone#: [phone] option 3     Fax#: [phone] Pre-Cert#: [id number]  Received insurance approval from [place] on 10/25/23. Pt approved from 10/26/23-5/26725. Review date is 11/01/23.    Employer:  Benefits:  Phone #: [phone]     Name:  Eff. Date: 06/09/23-06/07/24     Deduct: does not have one      Out of Pocket Max: $3,900 ($80 met)      Life Max: NA CIR: $295/day co-pay for days 1-5, 100% coverage for days 6+      SNF: 100% coverage for days 1-20, $203 co-pay/day for days 21-100 Outpatient: $20/visit co-pay     Co-Pay:  Home Health: 100% coverage      Co-Pay:  DME: 80% coverage     Co-Pay: 20% co-insurance Providers: in-network SECONDARY:       Policy#:      Phone#:   Artist:       Phone#:   The Data processing manager" for patients in Inpatient Rehabilitation Facilities with attached "Privacy Act Statement-Health Care Records" was provided and verbally reviewed with: Family  Emergency Contact Information Contact Information     Name Relation Home Work Mobile   [place] Son [phone]     Nayana, Lenig [phone]  [phone]      Other Contacts   None on File     Current Medical History  Patient Admitting Diagnosis: CVA  History of Present Illness:  Shelly Fields. [name] is a 89 year old right-handed female with history significant for atrial fibrillation off her Xarelto  due to recent concern for blood loss anemia followed by cardiology service Dr. Teddie Favre and recent admission 09/22/2023 for GI bleed followed by Dr. Alita Irwin requiring transfusion of 3 units,  hypothyroidism, type 2 diabetes mellitus, hypertension.  Per chart review patient lives alone.  1 level home one-step to entry.  Reportedly independent driving prior to admission with an occasional rolling walker.  Presented to [facility] 10/21/2023 after backwards fall into her nightstand.  When family found her she was noted to have left-sided weakness and was dragging her left leg.    MRI showed acute right MCA territory infarction involving the right insula and overlying frontal parietal lobes.  No substantial mass effect.  CT angiogram head and neck showed occlusion of distal M2 branch of the right MCA.  No hemodynamically significant stenosis of the carotid arteries.  Admission chemistries unremarkable except hemoglobin 11.6, sodium 132, hemoglobin A1c 5.2, total CK within normal limits.  Echocardiogram with ejection fraction 70 to 75% and mild left ventricular hypertrophy.  Neurology follow-up patient was cleared to begin aspirin  81 mg daily and Plavix  75 mg daily for CVA prophylaxis x 90 days then aspirin  alone.  Tolerating regular consistency diet   Complete NIHSS TOTAL: 1  Patient's medical record from [facility] has been reviewed by the rehabilitation admission coordinator and physician.  Past Medical History  Past Medical History:  Diagnosis Date   Cor pulmonale (chronic) (HCC)    Diverticulosis  Essential hypertension    Hypothyroidism    Internal hemorrhoids    Persistent atrial fibrillation (HCC)    Pulmonary hypertension (HCC)    Seasonal allergies    Shingles    Has the patient had major surgery during 100 days prior to admission? Yes  Family History   family history includes Heart disease in her mother; Hypertension in her father.  Current Medications  Current Facility-Administered Medications:    acetaminophen  (TYLENOL ) tablet 650 mg, 650 mg, Oral, Q6H PRN **OR** acetaminophen  (TYLENOL ) suppository 650 mg, 650 mg, Rectal, Q6H PRN, Adefeso, Oladapo,  DO   ALPRAZolam  (XANAX ) tablet 0.125-0.25 mg, 0.125-0.25 mg, Oral, Daily PRN, Adefeso, Oladapo, DO, 0.25 mg at 10/23/23 2119   aspirin  EC tablet 81 mg, 81 mg, Oral, Daily, Mason Sole, Pratik D, DO, 81 mg at 10/26/23 0857   clopidogrel  (PLAVIX ) tablet 75 mg, 75 mg, Oral, Daily, Mason Sole, Pratik D, DO, 75 mg at 10/26/23 0856   docusate sodium  (COLACE) capsule 100 mg, 100 mg, Oral, BID, Adefeso, Oladapo, DO, 100 mg at 10/26/23 0857   ferrous gluconate  (FERGON) tablet 324 mg, 324 mg, Oral, Q48H, Shah, Pratik D, DO, 324 mg at 10/26/23 0857   furosemide  (LASIX ) tablet 40 mg, 40 mg, Oral, Q48H, Shah, Pratik D, DO, 40 mg at 10/26/23 0857   furosemide  (LASIX ) tablet 60 mg, 60 mg, Oral, Q48H, Madueme, Elvira C, RPH, 60 mg at 10/25/23 1016   insulin  aspart (novoLOG ) injection 0-9 Units, 0-9 Units, Subcutaneous, TID WC, Adefeso, Oladapo, DO   levothyroxine  (SYNTHROID ) tablet 75 mcg, 75 mcg, Oral, Q0600, Adefeso, Oladapo, DO, 75 mcg at 10/26/23 1610   melatonin tablet 3 mg, 3 mg, Oral, QHS PRN, Adefeso, Oladapo, DO, 3 mg at 10/25/23 2105   metoprolol  succinate (TOPROL -XL) 24 hr tablet 50 mg, 50 mg, Oral, Daily, Mason Sole, Pratik D, DO, 50 mg at 10/25/23 1021   ondansetron  (ZOFRAN ) tablet 4 mg, 4 mg, Oral, Q6H PRN **OR** ondansetron  (ZOFRAN ) injection 4 mg, 4 mg, Intravenous, Q6H PRN, Adefeso, Oladapo, DO   psyllium (HYDROCIL/METAMUCIL) 1 packet, 1 packet, Oral, BID, Adefeso, Oladapo, DO, 1 packet at 10/26/23 0858  Patients Current Diet:  Diet Order             Diet - low sodium heart healthy           Diet - low sodium heart healthy           Diet heart healthy/carb modified Room service appropriate? Yes; Fluid consistency: Thin  Diet effective now                  Precautions / Restrictions Precautions Precautions: Fall Restrictions Weight Bearing Restrictions Per Provider Order: No   Has the patient had 2 or more falls or a fall with injury in the past year? Yes  Prior Activity Level Community  (5-7x/wk): gets out of house ~4 days/week; drives  Prior Functional Level Self Care: Did the patient need help bathing, dressing, using the toilet or eating? Independent  Indoor Mobility: Did the patient need assistance with walking from room to room (with or without device)? Independent  Stairs: Did the patient need assistance with internal or external stairs (with or without device)? Unknown (pt avoids stairs)  Functional Cognition: Did the patient need help planning regular tasks such as shopping or remembering to take medications? Independent  Patient Information Are you of Hispanic, Latino/a,or Spanish origin?: X. Patient unable to respond, A. No, not of Hispanic, Latino/a, or Spanish origin What is your race?:  X. Patient unable to respond, A. White Do you need or want an interpreter to communicate with a doctor or health care staff?: 9. Unable to respond  Patient's Response To:  Health Literacy and Transportation Is the patient able to respond to health literacy and transportation needs?: No Health Literacy - How often do you need to have someone help you when you read instructions, pamphlets, or other written material from your doctor or pharmacy?: Patient unable to respond  Home Assistive Devices / Equipment Home Equipment: Agricultural consultant (2 wheels), BSC/3in1  Prior Device Use: Indicate devices/aids used by the patient prior to current illness, exacerbation or injury? Walker (uses occasionally)  Current Functional Level Cognition  Overall Cognitive Status: Impaired/Different from baseline Orientation Level: Oriented X4 Attention: Focused Focused Attention: Appears intact Memory: Appears intact Awareness: Impaired Awareness Impairment: Intellectual impairment Problem Solving: Appears intact Executive Function: Reasoning Reasoning: Appears intact    Extremity Assessment (includes Sensation/Coordination)  Upper Extremity Assessment: Defer to OT evaluation LUE Deficits /  Details: 3-/5 shoulder flexion; 3+/5 elbow, wrist, and grasp. Poor fine and gross motor coordination. LUE Sensation: WNL LUE Coordination: decreased gross motor, decreased fine motor  Lower Extremity Assessment: RLE deficits/detail, LLE deficits/detail RLE Deficits / Details: grossly -5/10 RLE Sensation: WNL RLE Coordination: WNL LLE Deficits / Details: grossly 3/5 LLE Sensation: WNL LLE Coordination: decreased fine motor, decreased gross motor    ADLs  Overall ADL's : Needs assistance/impaired Eating/Feeding: Minimal assistance, Set up, Sitting Grooming: Set up, Minimal assistance, Sitting Upper Body Bathing: Minimal assistance, Sitting Lower Body Bathing: Minimal assistance, Sitting/lateral leans Upper Body Dressing : Minimal assistance, Sitting Lower Body Dressing: Minimal assistance, Sitting/lateral leans Lower Body Dressing Details (indicate cue type and reason): Able to reach B LE but difficulty coordinating L hand. Toilet Transfer: Moderate assistance, Rolling walker (2 wheels), Ambulation, Stand-pivot Toilet Transfer Details (indicate cue type and reason): Simualted via EOB to chair and ambulation in the hall. Toileting- Clothing Manipulation and Hygiene: Minimal assistance, Moderate assistance, Sitting/lateral lean Tub/ Shower Transfer: Moderate assistance, Ambulation, Rolling walker (2 wheels) Functional mobility during ADLs: Moderate assistance, Rolling walker (2 wheels) General ADL Comments: Able to walk in hall with RW but required seated rest in w/c due to L LE seeming to get weaker. Assist needed to steer RW.    Mobility  Overal bed mobility: Needs Assistance Bed Mobility: Supine to Sit Supine to sit: Min assist General bed mobility comments: increased time, labored movement    Transfers  Overall transfer level: Needs assistance Equipment used: Rolling walker (2 wheels) Transfers: Sit to/from Stand, Bed to chair/wheelchair/BSC Sit to Stand: Min assist Bed to/from  chair/wheelchair/BSC transfer type:: Step pivot Step pivot transfers: Mod assist General transfer comment: needs assistance for safety; Lt LE buckles at times due to weakness    Ambulation / Gait / Stairs / Wheelchair Mobility  Ambulation/Gait Ambulation/Gait assistance: Editor, commissioning (Feet): 100 Feet Assistive device: Rolling walker (2 wheels) Gait Pattern/deviations: Decreased step length - right, Decreased step length - left, Decreased stride length, Antalgic, Knees buckling, Drifts right/left General Gait Details: slow labored movement with frequent drifting/leaning to the left with buckling of LLE once fatigued Gait velocity: decreased Pre-gait activities: pt with noted buckling of LT LE with ambulation resulting in antalgic gait    Posture / Balance Dynamic Sitting Balance Sitting balance - Comments: seated at EOB Balance Overall balance assessment: Needs assistance Sitting-balance support: Feet supported, No upper extremity supported Sitting balance-Leahy Scale: Poor Sitting balance - Comments: seated at  EOB Postural control: Left lateral lean Standing balance support: During functional activity, Bilateral upper extremity supported, Reliant on assistive device for balance Standing balance-Leahy Scale: Poor Standing balance comment: using RW    Special needs/care consideration Skin Abrasion: back/left; Ecchymosis: arm, back/bilateral; Erythema/Redness: back/left and Diabetic management Novolog  0-9 units 3x daily with meals   Previous Home Environment  Living Arrangements: Alone  Lives With: Alone Available Help at Discharge: Family, Available 24 hours/day Type of Home: House Home Layout: One level Home Access: Level entry Entrance Stairs-Rails: None Entrance Stairs-Number of Steps: 1 Bathroom Shower/Tub: Engineer, manufacturing systems: Standard Bathroom Accessibility: No How Accessible: Accessible via walker Home Care Services: No Additional Comments: Pt  lives beside family.  Discharge Living Setting Plans for Discharge Living Setting: Patient's home Type of Home at Discharge: House Discharge Home Layout: One level Discharge Home Access: Level entry Discharge Bathroom Shower/Tub: Tub/shower unit Discharge Bathroom Toilet: Standard Discharge Bathroom Accessibility: No Does the patient have any problems obtaining your medications?: No  Social/Family/Support Systems Anticipated Caregiver: Krisy Dix (son) and other family Anticipated Caregiver's Contact Information: [name]: [phone] Caregiver Availability: 24/7 Discharge Plan Discussed with Primary Caregiver: Yes Is Caregiver In Agreement with Plan?: Yes Does Caregiver/Family have Issues with Lodging/Transportation while Pt is in Rehab?: No  Goals Patient/Family Goal for Rehab: Supervision: PT/OT Expected length of stay: 7-10 days Pt/Family Agrees to Admission and willing to participate: Yes Program Orientation Provided & Reviewed with Pt/Caregiver Including Roles  & Responsibilities: Yes  Decrease burden of Care through IP rehab admission: NA  Possible need for SNF placement upon discharge: Not anticipated  Patient Condition: I have reviewed medical records from [facility], spoken with CM, and son. I discussed via phone for inpatient rehabilitation assessment.  Patient will benefit from ongoing PT, OT, and SLP, can actively participate in 3 hours of therapy a day 5 days of the week, and can make measurable gains during the admission.  Patient will also benefit from the coordinated team approach during an Inpatient Acute Rehabilitation admission.  The patient will receive intensive therapy as well as Rehabilitation physician, nursing, social worker, and care management interventions.  Due to safety, skin/wound care, disease management, medication administration, pain management, and patient education the patient requires 24 hour a day rehabilitation nursing.  The patient is  currently min assist overall with mobility and basic ADLs.  Discharge setting and therapy post discharge at home with home health is anticipated.  Patient has agreed to participate in the Acute Inpatient Rehabilitation Program and will admit today.  Preadmission Screen Completed By:  Amiel Kalata, 10/26/2023 9:40 AM ______________________________________________________________________   Discussed status with Dr. Raynaldo Call  on 10/26/23 at [phone] and received approval for admission today.  Admission Coordinator:  Al Hover, time 9381 Date 10/26/23   Assessment/Plan: Diagnosis: R MCA stroke Does the need for close, 24 hr/day Medical supervision in concert with the patient's rehab needs make it unreasonable for this patient to be served in a less intensive setting? Yes Co-Morbidities requiring supervision/potential complications: Afib, Anxiety, Hypothyroid, DM- A1c 5.2 Due to bladder management, bowel management, safety, skin/wound care, disease management, medication administration, pain management, and patient education, does the patient require 24 hr/day rehab nursing? Yes Does the patient require coordinated care of a physician, rehab nurse, PT, OT, and SLP to address physical and functional deficits in the context of the above medical diagnosis(es)? Yes Addressing deficits in the following areas: balance, endurance, locomotion, strength, transferring, bowel/bladder control, bathing, dressing,  feeding, grooming, and toileting Can the patient actively participate in an intensive therapy program of at least 3 hrs of therapy 5 days a week? Yes The potential for patient to make measurable gains while on inpatient rehab is good Anticipated functional outcomes upon discharge from inpatient rehab: supervision PT, supervision OT, n/a SLP Estimated rehab length of stay to reach the above functional goals is: 7-10 days Anticipated discharge destination: Home 10. Overall  Rehab/Functional Prognosis: good   MD Signature:

## 2023-10-24 DIAGNOSIS — I639 Cerebral infarction, unspecified: Secondary | ICD-10-CM | POA: Diagnosis not present

## 2023-10-24 LAB — GLUCOSE, CAPILLARY
Glucose-Capillary: 93 mg/dL (ref 70–99)
Glucose-Capillary: 103 mg/dL — ABNORMAL HIGH (ref 70–99)
Glucose-Capillary: 94 mg/dL (ref 70–99)
Glucose-Capillary: 99 mg/dL (ref 70–99)

## 2023-10-24 MED ORDER — FUROSEMIDE 40 MG PO TABS
60.0000 mg | ORAL_TABLET | ORAL | Status: DC
Start: 2023-10-25 — End: 2023-10-26
  Administered 2023-10-25: 60 mg via ORAL
  Filled 2023-10-24 (×2): qty 1

## 2023-10-24 MED ORDER — FUROSEMIDE 40 MG PO TABS
40.0000 mg | ORAL_TABLET | ORAL | Status: DC
  Administered 2023-10-24 – 2023-10-26 (×2): 40 mg via ORAL
  Filled 2023-10-24 (×2): qty 1

## 2023-10-24 MED ORDER — METOPROLOL SUCCINATE ER 50 MG PO TB24
50.0000 mg | ORAL_TABLET | Freq: Every day | ORAL | Status: DC
  Administered 2023-10-24 – 2023-10-26 (×3): 50 mg via ORAL
  Filled 2023-10-24 (×3): qty 1

## 2023-10-24 MED ORDER — FERROUS GLUCONATE 324 (38 FE) MG PO TABS
324.0000 mg | ORAL_TABLET | ORAL | Status: DC
  Administered 2023-10-24 – 2023-10-26 (×2): 324 mg via ORAL
  Filled 2023-10-24 (×2): qty 1

## 2023-10-24 NOTE — Progress Notes (Signed)
Inpatient Rehab Admissions Coordinator:  ?Insurance authorization started. Will continue to follow. ? ? ?Adela Esteban Graves Madden, MS, CCC-SLP ?Admissions Coordinator ?[phone] ? ?

## 2023-10-24 NOTE — Progress Notes (Signed)
 PROGRESS NOTE    Shelly Fields  ZOX:[id number] DOB: 01-06-1935 DOA: 10/21/2023 PCP: Orlena Bitters, MD   Brief Narrative:    Shelly Fields is an 89 y.o. female with medical history significant of atrial fibrillation, hypothyroidism, T2DM who presents to the emergency department from home via EMS due to fall sustained at home at night and she was found the following day in the afternoon by family members.  She was noted to have significant left-sided weakness especially to her left leg.  She was admitted for evaluation of acute ischemic CVA.  Brain MRI demonstrates right MCA CVA and neurology recommending DAPT x 90 days and then aspirin  monotherapy after.  Assessment & Plan:   Principal Problem:   Acute ischemic stroke [facility]) Active Problems:   Hypothyroidism, acquired   Persistent atrial fibrillation (HCC)   Constipation   Controlled type 2 diabetes mellitus without complication, without long-term current use of insulin  (HCC)   Anxiety   Insomnia   Fall at home, initial encounter   Abrasion of back  Assessment and Plan:   Acute right MCA distribution CVA CT angiography head and neck with and without contrast showed occlusion of a distal M2 branch of the right MCA Brain MRI confirms ischemic CVA Echocardiogram with findings of mild LVH and no other significant findings Aspirin  325 mg and Plavix  300 mg p.o. x 1 was given per neurologist recommendation and now started on aspirin  and Plavix  Continue fall precautions and neuro checks Lipid panel, LDL 59 and hemoglobin A1c 8.1% PT recommending CIR and she is currently awaiting placement Appreciate neurology evaluation Recommend to start anticoagulation in the future once further evaluated by GI   Fall at home Patient was on the floor for several hours Total CK within normal limits Continue fall precaution   Abrasion of back Continue wound care   Persistent atrial fibrillation Metoprolol  will be temporarily held at this time  due to need for permissive hypertension Patient is not on any blood thinner   Acquired hypothyroidism Continue Synthroid    Type 2 diabetes mellitus without complication Continue ISS and hypoglycemia protocol   Anxiety Continue Xanax  per home regimen   Constipation Continue Colace and psyllium per home regimen   Insomnia Continue melatonin     DVT prophylaxis: SCDs Code Status: DNR Family Communication: Son at bedside 5/18 Disposition Plan:  Status is: Inpatient Remains inpatient appropriate because: Need for rehab placement.   Consultants:  Neurology  Procedures:  None  Antimicrobials:  None   Subjective: Patient seen and evaluated today with no new acute complaints or concerns. No acute concerns or events noted overnight.  Objective: Vitals:   10/23/23 1922 10/23/23 2108 10/23/23 2304 10/24/23 0410  BP: (!) 158/100  (!) 142/71 (!) 140/74  Pulse: 74  88 96  Resp: 20  20 20   Temp: 100 F (37.8 C) 99.6 F (37.6 C) 99.1 F (37.3 C) 98 F (36.7 C)  TempSrc: Oral Oral Oral Oral  SpO2: 100%  97% 98%  Weight:      Height:        Intake/Output Summary (Last 24 hours) at 10/24/2023 1338 Last data filed at 10/24/2023 0441 Gross per 24 hour  Intake 720 ml  Output --  Net 720 ml   Filed Weights   10/21/23 2300  Weight: 40.4 kg    Examination:  General exam: Appears calm and comfortable, elderly/frail Respiratory system: Clear to auscultation. Respiratory effort normal. Cardiovascular system: S1 & S2 heard, RRR.  Gastrointestinal system:  Abdomen is soft Central nervous system: Alert and awake Extremities: No edema Skin: No significant lesions noted Psychiatry: Flat affect.    Data Reviewed: I have personally reviewed following labs and imaging studies  CBC: Recent Labs  Lab 10/21/23 1854 10/22/23 0443 10/23/23 0407  WBC 7.0 7.7 8.4  NEUTROABS 5.5  --   --   HGB 11.6* 10.2* 11.0*  HCT 37.3 33.8* 36.1  MCV 89.4 88.3 88.0  PLT 241 210 218    Basic Metabolic Panel: Recent Labs  Lab 10/21/23 1854 10/22/23 0443 10/23/23 0407  NA 132* 133* 131*  K 3.7 3.1* 3.6  CL 98 103 100  CO2 24 22 22   GLUCOSE 109* 92 83  BUN 18 16 12   CREATININE 0.75 0.86 0.61  CALCIUM 9.2 8.5* 8.5*  MG  --  1.9 2.0  PHOS  --  3.1  --    GFR: Estimated Creatinine Clearance: 32.2 mL/min (by C-G formula based on SCr of 0.61 mg/dL). Liver Function Tests: Recent Labs  Lab 10/21/23 1854 10/22/23 0443  AST 25 17  ALT 16 15  ALKPHOS 104 87  BILITOT 0.9 0.8  PROT 7.5 6.3*  ALBUMIN  3.6 3.1*   No results for input(s): "LIPASE", "AMYLASE" in the last 168 hours. No results for input(s): "AMMONIA" in the last 168 hours. Coagulation Profile: Recent Labs  Lab 10/21/23 1927  INR 1.1   Cardiac Enzymes: Recent Labs  Lab 10/21/23 1854 10/22/23 0443  CKTOTAL 69 50   BNP (last 3 results) No results for input(s): "PROBNP" in the last 8760 hours. HbA1C: Recent Labs    10/21/23 1854  HGBA1C 5.2   CBG: Recent Labs  Lab 10/23/23 1150 10/23/23 1605 10/23/23 2126 10/24/23 0810 10/24/23 1217  GLUCAP 120* 125* 135* 93 103*   Lipid Profile: Recent Labs    10/22/23 0443  CHOL 112  HDL 44  LDLCALC 59  TRIG 43  CHOLHDL 2.5   Thyroid  Function Tests: No results for input(s): "TSH", "T4TOTAL", "FREET4", "T3FREE", "THYROIDAB" in the last 72 hours. Anemia Panel: No results for input(s): "VITAMINB12", "FOLATE", "FERRITIN", "TIBC", "IRON", "RETICCTPCT" in the last 72 hours. Sepsis Labs: No results for input(s): "PROCALCITON", "LATICACIDVEN" in the last 168 hours.  No results found for this or any previous visit (from the past 240 hours).       Radiology Studies: MR BRAIN WO CONTRAST Result Date: 10/22/2023 CLINICAL DATA:  Neuro deficit, acute, stroke suspected EXAM: MRI HEAD WITHOUT CONTRAST TECHNIQUE: Multiplanar, multiecho pulse sequences of the brain and surrounding structures were obtained without intravenous contrast. COMPARISON:   CTA head/neck Oct 21, 2023. FINDINGS: Brain: Acute right MCA territory infarcts involving the right insula and overlying frontal and parietal lobes. Associated edema without substantial mass effect. No midline shift or acute hemorrhage. No mass lesion or hydrocephalus. Patchy T2/FLAIR hyperintensities the white matter compatible with chronic microvascular ischemic disease. Vascular: Major arterial flow voids are maintained skull base. Skull and upper cervical spine: Normal marrow signal. Sinuses/Orbits: Clear sinuses.  No acute orbital findings. Other: No mastoid effusions. IMPRESSION: Acute right MCA territory infarcts involving the right insula and overlying frontal and parietal lobes. No substantial mass effect. Electronically Signed   By: Stevenson Elbe M.D.   On: 10/22/2023 23:04   ECHOCARDIOGRAM LIMITED Result Date: 10/22/2023    ECHOCARDIOGRAM LIMITED REPORT   Patient Name:   Shelly Fields [facility] Date of Exam: 10/22/2023 Medical Rec #:  [id number]      Height:       62.0  in Accession #:    [id number]     Weight:       89.1 lb Date of Birth:  1935-05-16     BSA:          1.356 m Patient Age:    88 years       BP:           148/66 mmHg Patient Gender: F              HR:           73 bpm. Exam Location:  [facility] Procedure: Limited Echo, Cardiac Doppler and Color Doppler (Both Spectral and            Color Flow Doppler were utilized during procedure). Indications:    Stroke  History:        Patient has prior history of Echocardiogram examinations, most                 recent 09/17/2023. CHF, Abnormal ECG, Stroke, Arrythmias:Atrial                 Fibrillation, Signs/Symptoms:Syncope; Risk Factors:Diabetes.  Sonographer:    Raynelle Callow RDCS Referring Phys: [id number] OLADAPO ADEFESO IMPRESSIONS  1. There is mild left ventricular hypertrophy. Left ventricular diastolic parameters are indeterminate. There is the interventricular septum is flattened in systole and diastole, consistent with right ventricular pressure and  volume overload.  2. Right ventricular systolic function is moderately reduced. The right ventricular size is severely enlarged. There is severely elevated pulmonary artery systolic pressure. The estimated right ventricular systolic pressure is 42.2 mmHg.  3. Left atrial size was severely dilated.  4. Right atrial size was severely dilated.  5. The mitral valve is abnormal. Mild mitral valve regurgitation. No evidence of mitral stenosis.  6. The tricuspid valve is abnormal. Tricuspid valve regurgitation is severe.  7. The aortic valve is tricuspid. Aortic valve regurgitation is mild. No aortic stenosis is present.  8. The inferior vena cava is dilated in size with <50% respiratory variability, suggesting right atrial pressure of 15 mmHg.  9. Limited echo in setting of stroke FINDINGS  Left Ventricle: The left ventricular internal cavity size was normal in size. There is mild left ventricular hypertrophy. The interventricular septum is flattened in systole and diastole, consistent with right ventricular pressure and volume overload. Left ventricular diastolic parameters are indeterminate. Right Ventricle: The right ventricular size is severely enlarged. Right ventricular systolic function is moderately reduced. There is severely elevated pulmonary artery systolic pressure. The tricuspid regurgitant velocity is 2.61 m/s, and with an assumed right atrial pressure of 15 mmHg, the estimated right ventricular systolic pressure is 42.2 mmHg. Left Atrium: Left atrial size was severely dilated. Right Atrium: Right atrial size was severely dilated. Mitral Valve: The mitral valve is abnormal. Mild mitral valve regurgitation. No evidence of mitral valve stenosis. MV peak gradient, 6.6 mmHg. The mean mitral valve gradient is 2.0 mmHg. Tricuspid Valve: The tricuspid valve is abnormal. Tricuspid valve regurgitation is severe. No evidence of tricuspid stenosis. Aortic Valve: The aortic valve is tricuspid. Aortic valve regurgitation  is mild. No aortic stenosis is present. Aortic valve mean gradient measures 1.3 mmHg. Aortic valve peak gradient measures 1.8 mmHg. Aortic valve area, by VTI measures 3.02 cm. Aorta: The aortic root and ascending aorta are structurally normal, with no evidence of dilitation. Venous: The inferior vena cava is dilated in size with less than 50% respiratory variability, suggesting right atrial pressure of 15 mmHg. IAS/Shunts:  No atrial level shunt detected by color flow Doppler. LEFT VENTRICLE PLAX 2D LVIDd:         3.60 cm LVIDs:         2.36 cm LV PW:         1.10 cm LV IVS:        1.08 cm LVOT diam:     2.15 cm LV SV:         54 LV SV Index:   40 LVOT Area:     3.63 cm  LV Volumes (MOD) LV vol d, MOD A2C: 62.1 ml LV vol d, MOD A4C: 52.6 ml LV vol s, MOD A2C: 24.8 ml LV vol s, MOD A4C: 15.8 ml LV SV MOD A2C:     37.4 ml LV SV MOD A4C:     52.6 ml LV SV MOD BP:      36.8 ml IVC IVC diam: 3.50 cm LEFT ATRIUM             Index        RIGHT ATRIUM           Index LA Vol (A2C):   94.6 ml 69.79 ml/m  RA Area:     40.60 cm LA Vol (A4C):   68.9 ml 50.83 ml/m  RA Volume:   168.00 ml 123.94 ml/m LA Biplane Vol: 79.3 ml 58.50 ml/m  AORTIC VALVE AV Area (Vmax):    4.24 cm AV Area (Vmean):   2.90 cm AV Area (VTI):     3.02 cm AV Vmax:           66.37 cm/s AV Vmean:          55.426 cm/s AV VTI:            0.179 m AV Peak Grad:      1.8 mmHg AV Mean Grad:      1.3 mmHg LVOT Vmax:         77.43 cm/s LVOT Vmean:        44.211 cm/s LVOT VTI:          0.149 m LVOT/AV VTI ratio: 0.83  AORTA Ao Root diam: 3.30 cm Ao Asc diam:  3.40 cm MITRAL VALVE               TRICUSPID VALVE MV Area (PHT): 3.61 cm    TR Peak grad:   27.2 mmHg MV Peak grad:  6.6 mmHg    TR Vmax:        261.00 cm/s MV Mean grad:  2.0 mmHg MV Vmax:       1.28 m/s    SHUNTS MV Vmean:      63.9 cm/s   Systemic VTI:  0.15 m MV Decel Time: 210 msec    Systemic Diam: 2.15 cm MV E velocity: 88.70 cm/s Armida Lander MD Electronically signed by Armida Lander MD  Signature Date/Time: 10/22/2023/3:14:08 PM    Final      Scheduled Meds:  aspirin  EC  81 mg Oral Daily   clopidogrel   75 mg Oral Daily   docusate sodium   100 mg Oral BID   ferrous gluconate   324 mg Oral Q48H   furosemide   40 mg Oral Q48H   [START ON 10/25/2023] furosemide   60 mg Oral Q48H   insulin  aspart  0-9 Units Subcutaneous TID WC   levothyroxine   75 mcg Oral Q0600   metoprolol  succinate  50 mg Oral Daily   psyllium  1 packet Oral BID  LOS: 1 day    Time spent: 55 minutes    Bev Drennen Loran Rock, DO [facility]  If 7PM-7AM, please contact night-coverage [email or web address] 10/24/2023, 1:38 PM

## 2023-10-25 DIAGNOSIS — I639 Cerebral infarction, unspecified: Secondary | ICD-10-CM | POA: Diagnosis not present

## 2023-10-25 LAB — GLUCOSE, CAPILLARY
Glucose-Capillary: 102 mg/dL — ABNORMAL HIGH (ref 70–99)
Glucose-Capillary: 107 mg/dL — ABNORMAL HIGH (ref 70–99)
Glucose-Capillary: 128 mg/dL — ABNORMAL HIGH (ref 70–99)
Glucose-Capillary: 81 mg/dL (ref 70–99)
Glucose-Capillary: 84 mg/dL (ref 70–99)

## 2023-10-25 MED ORDER — CLOPIDOGREL BISULFATE 75 MG PO TABS: 75.0000 mg | ORAL_TABLET | Freq: Every day | ORAL | 2 refills | Status: DC

## 2023-10-25 MED ORDER — ASPIRIN 81 MG PO TBEC: 81.0000 mg | DELAYED_RELEASE_TABLET | Freq: Every day | ORAL | 12 refills | Status: AC

## 2023-10-25 MED ORDER — ALPRAZOLAM 0.25 MG PO TABS: 0.1250 mg | ORAL_TABLET | Freq: Every day | ORAL | 0 refills | Status: DC | PRN

## 2023-10-25 NOTE — Progress Notes (Signed)
 Mobility Specialist Progress Note:    10/25/23 1410  Mobility  Activity Ambulated with assistance in hallway  Level of Assistance Minimal assist, patient does 75% or more  Assistive Device Front wheel walker  Distance Ambulated (ft) 25 ft  Range of Motion/Exercises Active;All extremities  Activity Response Tolerated well  Mobility Referral Yes  Mobility visit 1 Mobility  Mobility Specialist Start Time (ACUTE ONLY) 1345  Mobility Specialist Stop Time (ACUTE ONLY) 1410  Mobility Specialist Time Calculation (min) (ACUTE ONLY) 25 min   Pt received in bed, eager for mobility. Required MinA to stand and CGA to ambulate with RW. Tolerated well, left sided weakness and left knee buckles throughout ambulation. Returned to room, left pt supine. Son at bedside, all needs met.  Glinda Lapping Mobility Specialist Please contact via Special educational needs teacher or  Rehab office at [phone]

## 2023-10-25 NOTE — Plan of Care (Signed)
 NIH score 2 this shift

## 2023-10-25 NOTE — Progress Notes (Signed)
 PROGRESS NOTE    [name]  ZOX:[id number] DOB: [birth year] DOA: 10/21/2023 PCP: Orlena Bitters, MD   Brief Narrative:    Shelly Fields is an 89 y.o. female with medical history significant of atrial fibrillation, hypothyroidism, T2DM who presents to the emergency department from home via EMS due to fall sustained at home at night and she was found the following day in the afternoon by family members.  She was noted to have significant left-sided weakness especially to her left leg.  She was admitted for evaluation of acute ischemic CVA.  Brain MRI demonstrates right MCA CVA and neurology recommending DAPT x 90 days and then aspirin  monotherapy after.  Assessment & Plan:   Principal Problem:   Acute ischemic stroke [facility]) Active Problems:   Hypothyroidism, acquired   Persistent atrial fibrillation (HCC)   Constipation   Controlled type 2 diabetes mellitus without complication, without long-term current use of insulin  (HCC)   Anxiety   Insomnia   Fall at home, initial encounter   Abrasion of back  Assessment and Plan:   Acute right MCA distribution CVA CT angiography head and neck with and without contrast showed occlusion of a distal M2 branch of the right MCA Brain MRI confirms ischemic CVA Echocardiogram with findings of mild LVH and no other significant findings Aspirin  325 mg and Plavix  300 mg p.o. x 1 was given per neurologist recommendation and now started on aspirin  and Plavix  Continue fall precautions and neuro checks Lipid panel, LDL 59 and hemoglobin A1c 8.1% PT recommending CIR and she is currently awaiting placement Appreciate neurology evaluation Recommend to start anticoagulation in the future once further evaluated by GI   Fall at home Patient was on the floor for several hours Total CK within normal limits Continue fall precaution   Abrasion of back Continue wound care   Persistent atrial fibrillation Metoprolol  will be temporarily held at this time  due to need for permissive hypertension Patient is not on any blood thinner   Acquired hypothyroidism Continue Synthroid    Type 2 diabetes mellitus without complication Continue ISS and hypoglycemia protocol   Anxiety Continue Xanax  per home regimen   Constipation Continue Colace and psyllium per home regimen   Insomnia Continue melatonin     DVT prophylaxis: SCDs Code Status: DNR Family Communication: Son at bedside 5/18 Disposition Plan:  Status is: Inpatient Remains inpatient appropriate because: Need for rehab placement.   Consultants:  Neurology  Procedures:  None  Antimicrobials:  None   Subjective: Patient seen and evaluated today with no new acute complaints or concerns. No acute concerns or events noted overnight. Currently awaiting bed availability for CIR.  Objective: Vitals:   10/25/23 0255 10/25/23 0842 10/25/23 1012 10/25/23 1304  BP: (!) 145/84 109/86 119/66 (!) 142/69  Pulse: 96  77 87  Resp: 20 18  16   Temp: 98 F (36.7 C) 98.7 F (37.1 C)  97.6 F (36.4 C)  TempSrc: Oral Oral  Oral  SpO2: 97% 100%    Weight:      Height:        Intake/Output Summary (Last 24 hours) at 10/25/2023 1537 Last data filed at 10/25/2023 0916 Gross per 24 hour  Intake 480 ml  Output --  Net 480 ml   Filed Weights   10/21/23 2300  Weight: 40.4 kg    Examination:  General exam: Appears calm and comfortable, elderly/frail Respiratory system: Clear to auscultation. Respiratory effort normal. Cardiovascular system: S1 & S2 heard, RRR.  Gastrointestinal system: Abdomen is soft Central nervous system: Alert and awake Extremities: No edema Skin: No significant lesions noted Psychiatry: Flat affect.    Data Reviewed: I have personally reviewed following labs and imaging studies  CBC: Recent Labs  Lab 10/21/23 1854 10/22/23 0443 10/23/23 0407  WBC 7.0 7.7 8.4  NEUTROABS 5.5  --   --   HGB 11.6* 10.2* 11.0*  HCT 37.3 33.8* 36.1  MCV 89.4 88.3  88.0  PLT 241 210 218   Basic Metabolic Panel: Recent Labs  Lab 10/21/23 1854 10/22/23 0443 10/23/23 0407  NA 132* 133* 131*  K 3.7 3.1* 3.6  CL 98 103 100  CO2 24 22 22   GLUCOSE 109* 92 83  BUN 18 16 12   CREATININE 0.75 0.86 0.61  CALCIUM 9.2 8.5* 8.5*  MG  --  1.9 2.0  PHOS  --  3.1  --    GFR: Estimated Creatinine Clearance: 32.2 mL/min (by C-G formula based on SCr of 0.61 mg/dL). Liver Function Tests: Recent Labs  Lab 10/21/23 1854 10/22/23 0443  AST 25 17  ALT 16 15  ALKPHOS 104 87  BILITOT 0.9 0.8  PROT 7.5 6.3*  ALBUMIN  3.6 3.1*   No results for input(s): "LIPASE", "AMYLASE" in the last 168 hours. No results for input(s): "AMMONIA" in the last 168 hours. Coagulation Profile: Recent Labs  Lab 10/21/23 1927  INR 1.1   Cardiac Enzymes: Recent Labs  Lab 10/21/23 1854 10/22/23 0443  CKTOTAL 69 50   BNP (last 3 results) No results for input(s): "PROBNP" in the last 8760 hours. HbA1C: No results for input(s): "HGBA1C" in the last 72 hours.  CBG: Recent Labs  Lab 10/24/23 1217 10/24/23 1648 10/24/23 2026 10/25/23 0734 10/25/23 1152  GLUCAP 103* 94 99 84 81   Lipid Profile: No results for input(s): "CHOL", "HDL", "LDLCALC", "TRIG", "CHOLHDL", "LDLDIRECT" in the last 72 hours.  Thyroid  Function Tests: No results for input(s): "TSH", "T4TOTAL", "FREET4", "T3FREE", "THYROIDAB" in the last 72 hours. Anemia Panel: No results for input(s): "VITAMINB12", "FOLATE", "FERRITIN", "TIBC", "IRON ", "RETICCTPCT" in the last 72 hours. Sepsis Labs: No results for input(s): "PROCALCITON", "LATICACIDVEN" in the last 168 hours.  No results found for this or any previous visit (from the past 240 hours).       Radiology Studies: No results found.    Scheduled Meds:  aspirin  EC  81 mg Oral Daily   clopidogrel   75 mg Oral Daily   docusate sodium   100 mg Oral BID   ferrous gluconate   324 mg Oral Q48H   furosemide   40 mg Oral Q48H   furosemide   60 mg  Oral Q48H   insulin  aspart  0-9 Units Subcutaneous TID WC   levothyroxine   75 mcg Oral Q0600   metoprolol  succinate  50 mg Oral Daily   psyllium  1 packet Oral BID     LOS: 2 days    Time spent: 25 minutes    Alfonza Toft Loran Rock, DO [facility]  If 7PM-7AM, please contact night-coverage [email or web address] 10/25/2023, 3:37 PM

## 2023-10-25 NOTE — Progress Notes (Signed)
 Mobility Specialist Progress Note:    10/25/23 1015  Mobility  Activity Ambulated with assistance in hallway  Level of Assistance Minimal assist, patient does 75% or more  Assistive Device Front wheel walker  Distance Ambulated (ft) 22 ft  Range of Motion/Exercises Active;All extremities  Activity Response Tolerated well  Mobility Referral Yes  Mobility visit 1 Mobility  Mobility Specialist Start Time (ACUTE ONLY) 0955  Mobility Specialist Stop Time (ACUTE ONLY) 1015  Mobility Specialist Time Calculation (min) (ACUTE ONLY) 20 min   Pt received in chair, son in room. Eager for mobility, required MinA to stand and ambulate with RW. Tolerated well, left side weakness throughout. Returned to chair, all needs met.  Glinda Lapping Mobility Specialist Please contact via Special educational needs teacher or  Rehab office at [phone]

## 2023-10-25 NOTE — Progress Notes (Addendum)
 Inpatient Rehab Admissions Coordinator:  Awaiting insurance decision. Updated pt's son [name] and reviewed benefits. Will continue to follow.  1132: Received insurance authorization. Await bed availability.    Artemus Larsen, MS, CCC-SLP Admissions Coordinator [phone]

## 2023-10-25 NOTE — Progress Notes (Signed)
 Physical Therapy Treatment Patient Details Name: Shelly Fields MRN: [id number] DOB: 25-May-1935 Today's Date: 10/25/2023   History of Present Illness Shelly Fields is an 89 y.o. female with medical history significant of atrial fibrillation, hypothyroidism, T2DM who presents to the emergency department from home via EMS due to fall sustained at home around 10 PM last night.  Patient was unable to provide history, history was obtained from EDP and ED medical record.  Per report, patient lives alone and family checked on her this afternoon around 1:30 PM and was found on the floor.  Apparently, patient got out of bed and tried to use a walker, but she fell back onto a nightstand and had abrasions on back.  Family noted patient having left-sided weakness and dragging her left leg, so EMS was activated and patient was sent to the ED for further evaluation and management.    PT Comments  Pt up in chair with son present.  Very HOH but willing to participate with therapy today.  Still requires cues and assist with transfers due to decreased safety awareness.  Less drift/lean to left today with mobility.  Increased ambulation distance and reduced assistance.  Pt does have buckling of Lt knee with weight bearing, however does not give out completely but presents with antalgic gait due to this.  Pt returned to chair at bedside and activity completing LE therex when therapist left room.  Pt very motivated.     If plan is discharge home, recommend the following: A lot of help with walking and/or transfers;A little help with bathing/dressing/bathroom;Help with stairs or ramp for entrance;Assistance with cooking/housework     Equipment Recommendations  None recommended by PT           Mobility  Bed Mobility                    Transfers Overall transfer level: Needs assistance Equipment used: Rolling walker (2 wheels) Transfers: Sit to/from Stand, Bed to chair/wheelchair/BSC Sit to Stand: Min  assist           General transfer comment: needs assistance for safety; Lt LE buckles at times due to weakness    Ambulation/Gait Ambulation/Gait assistance: Min assist Gait Distance (Feet): 100 Feet Assistive device: Rolling walker (2 wheels) Gait Pattern/deviations: Decreased step length - right, Decreased step length - left, Decreased stride length, Antalgic, Knees buckling, Drifts right/left Gait velocity: decreased   Pre-gait activities: pt with noted buckling of LT LE with ambulation resulting in antalgic gait General Gait Details: slow labored movement with frequent drifting/leaning to the left with buckling of LLE once fatigued            Communication Communication Communication: Impaired Factors Affecting Communication: Hearing impaired  Cognition Arousal: Alert Behavior During Therapy: WFL for tasks assessed/performed   PT - Cognitive impairments: No apparent impairments                         Following commands: Intact Following commands impaired: Follows one step commands with increased time (due to being hard of hearing)    Cueing Cueing Techniques: Verbal cues, Gestural cues, Tactile cues  Exercises General Exercises - Lower Extremity Long Arc Quad: AROM, Both, Seated Hip Flexion/Marching: AROM, Both, Seated      PT Goals (current goals can now be found in the care plan section) Acute Rehab PT Goals Patient Stated Goal: return home after rehab PT Goal Formulation: With patient/family Time For Goal  Achievement: 11/05/23 Potential to Achieve Goals: Good Progress towards PT goals: Progressing toward goals    Frequency    Min 4X/week           Co-evaluation PT/OT/SLP Co-Evaluation/Treatment: Yes Reason for Co-Treatment: To address functional/ADL transfers PT goals addressed during session: Mobility/safety with mobility;Balance;Proper use of DME        AM-PAC PT "6 Clicks" Mobility   Outcome Measure  Help needed turning  from your back to your side while in a flat bed without using bedrails?: A Little Help needed moving from lying on your back to sitting on the side of a flat bed without using bedrails?: A Little Help needed moving to and from a bed to a chair (including a wheelchair)?: A Lot Help needed standing up from a chair using your arms (e.g., wheelchair or bedside chair)?: A Lot Help needed to walk in hospital room?: A Lot Help needed climbing 3-5 steps with a railing? : A Lot 6 Click Score: 14    End of Session   Activity Tolerance: Patient tolerated treatment well;Patient limited by fatigue Patient left: in chair;with call bell/phone within reach;with family/visitor present;with chair alarm set Nurse Communication: Mobility status PT Visit Diagnosis: Unsteadiness on feet (R26.81);Other abnormalities of gait and mobility (R26.89);Muscle weakness (generalized) (M62.81)     Time: 8295-6213 PT Time Calculation (min) (ACUTE ONLY): 20 min  Charges:    $Therapeutic Activity: 8-22 mins PT General Charges $$ ACUTE PT VISIT: 1 Visit                     Lorenso Romance, PTA/CLT [facility] Ph: [phone]    Lorenso Romance 10/25/2023, 11:23 AM

## 2023-10-25 NOTE — Plan of Care (Signed)

## 2023-10-26 ENCOUNTER — Encounter (HOSPITAL_COMMUNITY): Payer: Self-pay | Admitting: Physical Medicine & Rehabilitation

## 2023-10-26 ENCOUNTER — Inpatient Hospital Stay (HOSPITAL_COMMUNITY)
Admission: AD | Admit: 2023-10-26 | Discharge: 2023-11-04 | DRG: 056 | Disposition: A | Discharge status: Home Health | Source: Other Acute Inpatient Hospital | Attending: Physical Medicine & Rehabilitation | Admitting: Physical Medicine & Rehabilitation

## 2023-10-26 ENCOUNTER — Encounter (HOSPITAL_COMMUNITY): Payer: Self-pay | Admitting: Internal Medicine

## 2023-10-26 ENCOUNTER — Other Ambulatory Visit: Payer: Self-pay

## 2023-10-26 DIAGNOSIS — K31819 Angiodysplasia of stomach and duodenum without bleeding: Secondary | ICD-10-CM | POA: Diagnosis not present

## 2023-10-26 DIAGNOSIS — R195 Other fecal abnormalities: Secondary | ICD-10-CM | POA: Diagnosis not present

## 2023-10-26 DIAGNOSIS — E871 Hypo-osmolality and hyponatremia: Secondary | ICD-10-CM | POA: Diagnosis present

## 2023-10-26 DIAGNOSIS — K222 Esophageal obstruction: Secondary | ICD-10-CM | POA: Diagnosis present

## 2023-10-26 DIAGNOSIS — Z7901 Long term (current) use of anticoagulants: Secondary | ICD-10-CM

## 2023-10-26 DIAGNOSIS — I69398 Other sequelae of cerebral infarction: Principal | ICD-10-CM

## 2023-10-26 DIAGNOSIS — Z79899 Other long term (current) drug therapy: Secondary | ICD-10-CM

## 2023-10-26 DIAGNOSIS — K644 Residual hemorrhoidal skin tags: Secondary | ICD-10-CM | POA: Diagnosis not present

## 2023-10-26 DIAGNOSIS — D62 Acute posthemorrhagic anemia: Secondary | ICD-10-CM | POA: Diagnosis not present

## 2023-10-26 DIAGNOSIS — I071 Rheumatic tricuspid insufficiency: Secondary | ICD-10-CM | POA: Diagnosis not present

## 2023-10-26 DIAGNOSIS — I110 Hypertensive heart disease with heart failure: Secondary | ICD-10-CM | POA: Diagnosis not present

## 2023-10-26 DIAGNOSIS — I4821 Permanent atrial fibrillation: Secondary | ICD-10-CM | POA: Diagnosis present

## 2023-10-26 DIAGNOSIS — Z8249 Family history of ischemic heart disease and other diseases of the circulatory system: Secondary | ICD-10-CM | POA: Diagnosis not present

## 2023-10-26 DIAGNOSIS — I2781 Cor pulmonale (chronic): Secondary | ICD-10-CM | POA: Diagnosis present

## 2023-10-26 DIAGNOSIS — Z7982 Long term (current) use of aspirin: Secondary | ICD-10-CM

## 2023-10-26 DIAGNOSIS — E039 Hypothyroidism, unspecified: Secondary | ICD-10-CM | POA: Diagnosis not present

## 2023-10-26 DIAGNOSIS — H9190 Unspecified hearing loss, unspecified ear: Secondary | ICD-10-CM | POA: Diagnosis present

## 2023-10-26 DIAGNOSIS — K31811 Angiodysplasia of stomach and duodenum with bleeding: Secondary | ICD-10-CM | POA: Diagnosis not present

## 2023-10-26 DIAGNOSIS — I63511 Cerebral infarction due to unspecified occlusion or stenosis of right middle cerebral artery: Secondary | ICD-10-CM | POA: Diagnosis present

## 2023-10-26 DIAGNOSIS — R040 Epistaxis: Secondary | ICD-10-CM | POA: Diagnosis not present

## 2023-10-26 DIAGNOSIS — K922 Gastrointestinal hemorrhage, unspecified: Secondary | ICD-10-CM | POA: Diagnosis not present

## 2023-10-26 DIAGNOSIS — I5033 Acute on chronic diastolic (congestive) heart failure: Secondary | ICD-10-CM | POA: Diagnosis not present

## 2023-10-26 DIAGNOSIS — K5900 Constipation, unspecified: Secondary | ICD-10-CM | POA: Diagnosis not present

## 2023-10-26 DIAGNOSIS — I5032 Chronic diastolic (congestive) heart failure: Secondary | ICD-10-CM | POA: Diagnosis not present

## 2023-10-26 DIAGNOSIS — I482 Chronic atrial fibrillation, unspecified: Secondary | ICD-10-CM | POA: Diagnosis not present

## 2023-10-26 DIAGNOSIS — K2970 Gastritis, unspecified, without bleeding: Secondary | ICD-10-CM | POA: Diagnosis not present

## 2023-10-26 DIAGNOSIS — F419 Anxiety disorder, unspecified: Secondary | ICD-10-CM | POA: Diagnosis present

## 2023-10-26 DIAGNOSIS — E119 Type 2 diabetes mellitus without complications: Secondary | ICD-10-CM | POA: Diagnosis present

## 2023-10-26 DIAGNOSIS — K449 Diaphragmatic hernia without obstruction or gangrene: Secondary | ICD-10-CM | POA: Diagnosis not present

## 2023-10-26 DIAGNOSIS — Z79890 Hormone replacement therapy (postmenopausal): Secondary | ICD-10-CM | POA: Diagnosis not present

## 2023-10-26 DIAGNOSIS — D509 Iron deficiency anemia, unspecified: Secondary | ICD-10-CM | POA: Diagnosis not present

## 2023-10-26 DIAGNOSIS — L8990 Pressure ulcer of unspecified site, unspecified stage: Secondary | ICD-10-CM | POA: Insufficient documentation

## 2023-10-26 DIAGNOSIS — E876 Hypokalemia: Secondary | ICD-10-CM | POA: Diagnosis not present

## 2023-10-26 DIAGNOSIS — K648 Other hemorrhoids: Secondary | ICD-10-CM | POA: Diagnosis not present

## 2023-10-26 DIAGNOSIS — R54 Age-related physical debility: Secondary | ICD-10-CM | POA: Diagnosis present

## 2023-10-26 DIAGNOSIS — T18198A Other foreign object in esophagus causing other injury, initial encounter: Secondary | ICD-10-CM | POA: Diagnosis not present

## 2023-10-26 DIAGNOSIS — Z90710 Acquired absence of both cervix and uterus: Secondary | ICD-10-CM

## 2023-10-26 DIAGNOSIS — I2729 Other secondary pulmonary hypertension: Secondary | ICD-10-CM | POA: Diagnosis present

## 2023-10-26 DIAGNOSIS — I10 Essential (primary) hypertension: Secondary | ICD-10-CM | POA: Diagnosis not present

## 2023-10-26 DIAGNOSIS — I509 Heart failure, unspecified: Secondary | ICD-10-CM | POA: Diagnosis not present

## 2023-10-26 DIAGNOSIS — W19XXXD Unspecified fall, subsequent encounter: Secondary | ICD-10-CM | POA: Diagnosis present

## 2023-10-26 DIAGNOSIS — I639 Cerebral infarction, unspecified: Secondary | ICD-10-CM | POA: Diagnosis not present

## 2023-10-26 DIAGNOSIS — Z7902 Long term (current) use of antithrombotics/antiplatelets: Secondary | ICD-10-CM | POA: Diagnosis not present

## 2023-10-26 DIAGNOSIS — K2951 Unspecified chronic gastritis with bleeding: Secondary | ICD-10-CM | POA: Diagnosis not present

## 2023-10-26 DIAGNOSIS — D500 Iron deficiency anemia secondary to blood loss (chronic): Secondary | ICD-10-CM | POA: Diagnosis not present

## 2023-10-26 LAB — GLUCOSE, CAPILLARY
Glucose-Capillary: 119 mg/dL — ABNORMAL HIGH (ref 70–99)
Glucose-Capillary: 93 mg/dL (ref 70–99)
Glucose-Capillary: 85 mg/dL (ref 70–99)
Glucose-Capillary: 87 mg/dL (ref 70–99)

## 2023-10-26 MED ORDER — ONDANSETRON HCL 4 MG/2ML IJ SOLN: 4.0000 mg | Freq: Four times a day (QID) | INTRAMUSCULAR | Status: DC | PRN

## 2023-10-26 MED ORDER — ALPRAZOLAM 0.25 MG PO TABS: 0.1250 mg | ORAL_TABLET | Freq: Every day | ORAL | Status: DC | PRN

## 2023-10-26 MED ORDER — MELATONIN 3 MG PO TABS
3.0000 mg | ORAL_TABLET | Freq: Every evening | ORAL | Status: DC | PRN
  Administered 2023-10-26 – 2023-11-04 (×7): 3 mg via ORAL
  Filled 2023-10-26 (×7): qty 1

## 2023-10-26 MED ORDER — ASPIRIN 81 MG PO TBEC
81.0000 mg | DELAYED_RELEASE_TABLET | Freq: Every day | ORAL | Status: DC
  Administered 2023-10-27 – 2023-10-29 (×3): 81 mg via ORAL
  Filled 2023-10-26 (×3): qty 1

## 2023-10-26 MED ORDER — FUROSEMIDE 40 MG PO TABS
40.0000 mg | ORAL_TABLET | ORAL | Status: DC
  Administered 2023-10-28: 40 mg via ORAL
  Filled 2023-10-26: qty 1

## 2023-10-26 MED ORDER — ONDANSETRON HCL 4 MG PO TABS: 4.0000 mg | ORAL_TABLET | Freq: Four times a day (QID) | ORAL | Status: DC | PRN

## 2023-10-26 MED ORDER — CLOPIDOGREL BISULFATE 75 MG PO TABS
75.0000 mg | ORAL_TABLET | Freq: Every day | ORAL | Status: DC
  Administered 2023-10-27 – 2023-10-29 (×3): 75 mg via ORAL
  Filled 2023-10-26 (×3): qty 1

## 2023-10-26 MED ORDER — METOPROLOL SUCCINATE ER 50 MG PO TB24
75.0000 mg | ORAL_TABLET | Freq: Every day | ORAL | Status: DC
  Administered 2023-10-27 – 2023-11-04 (×9): 75 mg via ORAL
  Filled 2023-10-26 (×9): qty 1

## 2023-10-26 MED ORDER — PSYLLIUM 95 % PO PACK
1.0000 | PACK | Freq: Two times a day (BID) | ORAL | Status: DC
  Administered 2023-10-26 – 2023-11-04 (×15): 1 via ORAL
  Filled 2023-10-26 (×15): qty 1

## 2023-10-26 MED ORDER — LEVOTHYROXINE SODIUM 75 MCG PO TABS
75.0000 ug | ORAL_TABLET | Freq: Every day | ORAL | Status: DC
  Administered 2023-10-27 – 2023-11-04 (×8): 75 ug via ORAL
  Filled 2023-10-26 (×8): qty 1

## 2023-10-26 MED ORDER — FUROSEMIDE 40 MG PO TABS
60.0000 mg | ORAL_TABLET | ORAL | Status: DC
  Administered 2023-10-27: 60 mg via ORAL
  Filled 2023-10-26 (×2): qty 1

## 2023-10-26 MED ORDER — METOPROLOL SUCCINATE ER 50 MG PO TB24: 50.0000 mg | ORAL_TABLET | Freq: Every day | ORAL | Status: DC

## 2023-10-26 MED ORDER — ACETAMINOPHEN 325 MG PO TABS
650.0000 mg | ORAL_TABLET | Freq: Four times a day (QID) | ORAL | Status: DC | PRN
  Filled 2023-10-26: qty 2

## 2023-10-26 MED ORDER — ACETAMINOPHEN 650 MG RE SUPP: 650.0000 mg | Freq: Four times a day (QID) | RECTAL | Status: DC | PRN

## 2023-10-26 MED ORDER — DOCUSATE SODIUM 100 MG PO CAPS
100.0000 mg | ORAL_CAPSULE | Freq: Two times a day (BID) | ORAL | Status: DC
  Administered 2023-10-26 – 2023-11-04 (×16): 100 mg via ORAL
  Filled 2023-10-26 (×16): qty 1

## 2023-10-26 MED ORDER — FERROUS GLUCONATE 324 (38 FE) MG PO TABS
324.0000 mg | ORAL_TABLET | ORAL | Status: DC
  Administered 2023-10-28 – 2023-11-03 (×4): 324 mg via ORAL
  Filled 2023-10-26 (×4): qty 1

## 2023-10-26 MED ORDER — INSULIN ASPART 100 UNIT/ML IJ SOLN
0.0000 [IU] | Freq: Three times a day (TID) | INTRAMUSCULAR | Status: DC
Start: 2023-10-26 — End: 2023-10-26

## 2023-10-26 NOTE — Discharge Instructions (Addendum)
 Inpatient Rehab Discharge Instructions  Shelly Fields Discharge date and time: No discharge date for patient encounter.   Activities/Precautions/ Functional Status: Activity: activity as tolerated Diet: regular diet Wound Care: Routine skin checks Functional status:  ___ No restrictions     ___ Walk up steps independently ___ 24/7 supervision/assistance   ___ Walk up steps with assistance ___ Intermittent supervision/assistance  ___ Bathe/dress independently ___ Walk with walker     _x__ Bathe/dress with assistance ___ Walk Independently    ___ Shower independently ___ Walk with assistance    ___ Shower with assistance ___ No alcohol     ___ Return to work/school ________  Special Instructions:  No driving smoking or alcohol  Plan aspirin  81 mg daily and Plavix  75 mg daily x 90 days total then aspirin  aloneSTROKE/TIA DISCHARGE INSTRUCTIONS SMOKING Cigarette smoking nearly doubles your risk of having a stroke & is the single most alterable risk factor  If you smoke or have smoked in the last 12 months, you are advised to quit smoking for your health. Most of the excess cardiovascular risk related to smoking disappears within a year of stopping. Ask you doctor about anti-smoking medications [place] Quit Line: 1-800-QUIT NOW Free Smoking Cessation Classes [phone]  CHOLESTEROL Know your levels; limit fat & cholesterol in your diet  Lipid Panel     Component Value Date/Time   CHOL 112 10/22/2023 0443   TRIG 43 10/22/2023 0443   HDL 44 10/22/2023 0443   CHOLHDL 2.5 10/22/2023 0443   VLDL 9 10/22/2023 0443   LDLCALC 59 10/22/2023 0443     Many patients benefit from treatment even if their cholesterol is at goal. Goal: Total Cholesterol (CHOL) less than 160 Goal:  Triglycerides (TRIG) less than 150 Goal:  HDL greater than 40 Goal:  LDL (LDLCALC) less than 100   BLOOD PRESSURE American Stroke Association blood pressure target is less that 120/80 mm/Hg  Your discharge blood  pressure is:  BP: 128/83 Monitor your blood pressure Limit your salt and alcohol intake Many individuals will require more than one medication for high blood pressure  DIABETES (A1c is a blood sugar average for last 3 months) Goal HGBA1c is under 7% (HBGA1c is blood sugar average for last 3 months)  Diabetes: No known diagnosis of diabetes    Lab Results  Component Value Date   HGBA1C 5.2 10/21/2023    Your HGBA1c can be lowered with medications, healthy diet, and exercise. Check your blood sugar as directed by your physician Call your physician if you experience unexplained or low blood sugars.  PHYSICAL ACTIVITY/REHABILITATION Goal is 30 minutes at least 4 days per week  Activity: Increase activity slowly, Therapies: Physical Therapy: Home Health Return to work:  Activity decreases your risk of heart attack and stroke and makes your heart stronger.  It helps control your weight and blood pressure; helps you relax and can improve your mood. Participate in a regular exercise program. Talk with your doctor about the best form of exercise for you (dancing, walking, swimming, cycling).  DIET/WEIGHT Goal is to maintain a healthy weight  Your discharge diet is:  Diet Order             Diet heart healthy/carb modified Room service appropriate? Yes; Fluid consistency: Thin  Diet effective now                   liquids Your height is:  Height: 5\' 2"  (157.5 cm) Your current weight is: Weight: 38.7  kg Your Body Mass Index (BMI) is:  BMI (Calculated): 15.6 Following the type of diet specifically designed for you will help prevent another stroke. Your goal weight range is:   Your goal Body Mass Index (BMI) is 19-24. Healthy food habits can help reduce 3 risk factors for stroke:  High cholesterol, hypertension, and excess weight.  RESOURCES Stroke/Support Group:  Call [phone]   STROKE EDUCATION PROVIDED/REVIEWED AND GIVEN TO PATIENT Stroke warning signs and symptoms How to activate  emergency medical system (call 911). Medications prescribed at discharge. Need for follow-up after discharge. Personal risk factors for stroke. Pneumonia vaccine given: No Flu vaccine given: No My questions have been answered, the writing is legible, and I understand these instructions.  I will adhere to these goals & educational materials that have been provided to me after my discharge from the hospital.    COMMUNITY REFERRALS UPON DISCHARGE:    Home Health:   PT     OT       SNA                Agency:[name]       Phone:[phone]  *Please expect follow-up within 2-3 business days for discharge to schedule your home visit. If you have not received follow-up, be sure to contact the site directly.*    Medical Equipment/Items Ordered:youth rolling walker                                                 Agency/[phone]     My questions have been answered and I understand these instructions. I will adhere to these goals and the provided educational materials after my discharge from the hospital.  Patient/Caregiver Signature _______________________________ Date __________  Clinician Signature _______________________________________ Date __________  Please bring this form and your medication list with you to all your follow-up doctor's appointments.

## 2023-10-26 NOTE — H&P (Signed)
 Physical Medicine and Rehabilitation Admission H&P        Chief Complaint  Patient presents with   Fall  : HPI: Shelly Fields. [name] is a 89 year old right-handed female with history significant for atrial fibrillation off her Xarelto  due to recent concern for blood loss anemia followed by cardiology service Dr. Teddie Favre and recent admission 09/22/2023 for GI bleed followed by Dr. Alita Irwin requiring transfusion of 3 units, hypothyroidism, type 2 diabetes mellitus, hypertension and Anxiety as well as severe Hard of hearing.  Per chart review patient lives alone.  1 level home one-step to entry.  Reportedly independent driving prior to admission with an occasional rolling walker.  Presented to [facility] 10/21/2023 after backwards into her nightstand.  When family found her she was noted to have left-sided weakness and was dragging her left leg.  MRI showed acute right MCA territory infarction involving the right insula and overlying frontal parietal lobes.  No substantial mass effect.  CT angiogram head and neck showed occlusion of distal M2 branch of the right MCA.  No hemodynamically significant stenosis of the carotid arteries.  Admission chemistries unremarkable except hemoglobin 11.6, sodium 132, hemoglobin A1c 5.2, total CK within normal limits.  Echocardiogram with ejection fraction 70 to 75% and mild left ventricular hypertrophy.  Neurology follow-up patient was cleared to begin aspirin  81 mg daily and Plavix  75 mg daily for CVA prophylaxis x 90 days then aspirin  alone.  Tolerating regular consistency diet.  Therapy evaluations completed due to patient decreased functional mobility left-sided weakness was admitted for a comprehensive rehab program.   Pt reports uses a RW normally.  She denies any visual changes, any N/V- her L posterior ribs are sore due to her fall backwards at home. She took tylenol  for them. LBM yesterday with Metamucil and stool softener.    She notes that  fingertips are "black" due to blood draws.      Review of Systems  Constitutional:  Negative for chills and fever.  HENT:  Positive for hearing loss.   Eyes:  Negative for blurred vision and double vision.  Respiratory:  Negative for cough, shortness of breath and wheezing.   Cardiovascular:  Positive for palpitations. Negative for chest pain and PND.  Gastrointestinal:  Negative for constipation, heartburn, nausea and vomiting.  Genitourinary:  Negative for dysuria, flank pain and hematuria.  Musculoskeletal:  Positive for falls and myalgias.  Skin:  Negative for rash.  Neurological:  Positive for weakness.  Psychiatric/Behavioral:         Anxiety  All other systems reviewed and are negative.       Past Medical History:  Diagnosis Date   Cor pulmonale (chronic) (HCC)     Diverticulosis     Essential hypertension     Hypothyroidism     Internal hemorrhoids     Persistent atrial fibrillation (HCC)     Pulmonary hypertension (HCC)     Seasonal allergies     Shingles               Past Surgical History:  Procedure Laterality Date   ABDOMINAL HYSTERECTOMY       BIOPSY   03/25/2023    Procedure: BIOPSY;  Surgeon: Hargis Lias, MD;  [facility]: [facility];  Service: Endoscopy;;   CATARACT EXTRACTION       COLONOSCOPY N/A 01/12/2013    Procedure: COLONOSCOPY;  Surgeon: Ruby Corporal, MD;  [facility]: [facility];  Service: Endoscopy;  Laterality: N/A;  1030   COLONOSCOPY N/A 11/18/2017    Procedure: COLONOSCOPY;  Surgeon: Ruby Corporal, MD;  [facility]: [facility];  Service: Endoscopy;  Laterality: N/A;   COLONOSCOPY WITH PROPOFOL  N/A 03/25/2023    Procedure: COLONOSCOPY WITH PROPOFOL ;  Surgeon: Hargis Lias, MD;  Location: [facility];  Service: Endoscopy;  Laterality: N/A;  11:00AM;ASA 3   ESOPHAGOGASTRODUODENOSCOPY (EGD) WITH PROPOFOL  N/A 03/25/2023    Procedure: ESOPHAGOGASTRODUODENOSCOPY (EGD) WITH PROPOFOL ;  Surgeon: Hargis Lias, MD;  Location: [facility];  Service: Endoscopy;  Laterality: N/A;  11:00;ASA 3   LESION REMOVAL Right 09/13/2023    Procedure: excision of right lower extremity squamous cell carcinoma;  Surgeon: Marijo Shove, DO;  Location: [facility];  Service: General;  Laterality: Right;   POLYPECTOMY   11/18/2017    Procedure: POLYPECTOMY;  Surgeon: Ruby Corporal, MD;  Location: [facility];  Service: Endoscopy;;  colon             Family History  Problem Relation Age of Onset   Heart disease Mother     Hypertension Father     Colon cancer Neg Hx          Social History:  reports that she has never smoked. She has been exposed to tobacco smoke. She has never used smokeless tobacco. She reports that she does not drink alcohol and does not use drugs. Allergies:  Allergies  No Known Allergies         Medications Prior to Admission  Medication Sig Dispense Refill   Carboxymethylcellulose Sodium (THERATEARS OP) Place 1 drop into both eyes daily.        dapagliflozin  propanediol (FARXIGA ) 10 MG TABS tablet Take 1 tablet (10 mg total) by mouth daily before breakfast. 30 tablet 11   denosumab  (PROLIA ) 60 MG/ML SOSY injection Inject 60 mg into the skin every 6 (six) months.       docusate sodium  (COLACE) 100 MG capsule Take 1 capsule (100 mg total) by mouth 2 (two) times daily. 60 capsule 2   ferrous gluconate  (FERGON) 324 MG tablet Take 1 tablet (324 mg total) by mouth every other day.       furosemide  (LASIX ) 40 MG tablet Take 40 mg Daily alternating with 60 mg Daily (Patient taking differently: Take 40-60 mg by mouth daily. Take 40 mg Daily alternating with 60 mg Daily)       levothyroxine  (SYNTHROID ) 75 MCG tablet Take 75 mcg by mouth daily before breakfast.       metoprolol  succinate (TOPROL -XL) 50 MG 24 hr tablet TAKE 1 AND 1/2 TABLETS BY MOUTH  TWICE DAILY 300 tablet 2   potassium chloride  SA (KLOR-CON  M) 20 MEQ tablet Take 1 tablet (20 mEq total) by mouth daily. 90 tablet 1   psyllium (METAMUCIL) 58.6  % packet Take 1 packet by mouth 2 (two) times daily. (Patient taking differently: Take 1 packet by mouth at bedtime.) 30 each 12   [DISCONTINUED] ALPRAZolam  (XANAX ) 0.25 MG tablet Take 0.125-0.25 mg by mouth daily as needed for anxiety or sleep.                  Home: Home Living Family/patient expects to be discharged to:: Private residence Living Arrangements: Alone Available Help at Discharge: Family, Available 24 hours/day Type of Home: House Home Access: Level entry Entrance Stairs-Number of Steps: 1 Entrance Stairs-Rails: None Home Layout: One level Bathroom Shower/Tub: Engineer, manufacturing systems: Standard Bathroom Accessibility: No  Home Equipment: Rolling Walker (2 wheels), BSC/3in1 Additional Comments: Pt lives beside family.  Lives With: Alone   Functional History: Prior Function Prior Level of Function : Independent/Modified Independent Mobility Comments: using RW in the morning but not all day for ambulation ADLs Comments: Independent; drives   Functional Status:  Mobility: Bed Mobility Overal bed mobility: Needs Assistance Bed Mobility: Supine to Sit Supine to sit: Min assist General bed mobility comments: increased time, labored movement Transfers Overall transfer level: Needs assistance Equipment used: Rolling walker (2 wheels) Transfers: Sit to/from Stand, Bed to chair/wheelchair/BSC Sit to Stand: Min assist Bed to/from chair/wheelchair/BSC transfer type:: Step pivot Step pivot transfers: Mod assist General transfer comment: needs assistance for safety; Lt LE buckles at times due to weakness Ambulation/Gait Ambulation/Gait assistance: Min assist Gait Distance (Feet): 100 Feet Assistive device: Rolling walker (2 wheels) Gait Pattern/deviations: Decreased step length - right, Decreased step length - left, Decreased stride length, Antalgic, Knees buckling, Drifts right/left General Gait Details: slow labored movement with frequent drifting/leaning  to the left with buckling of LLE once fatigued Gait velocity: decreased Pre-gait activities: pt with noted buckling of LT LE with ambulation resulting in antalgic gait   ADL: ADL Overall ADL's : Needs assistance/impaired Eating/Feeding: Minimal assistance, Set up, Sitting Grooming: Set up, Minimal assistance, Sitting Upper Body Bathing: Minimal assistance, Sitting Lower Body Bathing: Minimal assistance, Sitting/lateral leans Upper Body Dressing : Minimal assistance, Sitting Lower Body Dressing: Minimal assistance, Sitting/lateral leans Lower Body Dressing Details (indicate cue type and reason): Able to reach B LE but difficulty coordinating L hand. Toilet Transfer: Moderate assistance, Rolling walker (2 wheels), Ambulation, Stand-pivot Toilet Transfer Details (indicate cue type and reason): Simualted via EOB to chair and ambulation in the hall. Toileting- Clothing Manipulation and Hygiene: Minimal assistance, Moderate assistance, Sitting/lateral lean Tub/ Shower Transfer: Moderate assistance, Ambulation, Rolling walker (2 wheels) Functional mobility during ADLs: Moderate assistance, Rolling walker (2 wheels) General ADL Comments: Able to walk in hall with RW but required seated rest in w/c due to L LE seeming to get weaker. Assist needed to steer RW.   Cognition: Cognition Overall Cognitive Status: Impaired/Different from baseline Orientation Level: Oriented X4 Attention: Focused Focused Attention: Appears intact Memory: Appears intact Awareness: Impaired Awareness Impairment: Intellectual impairment Problem Solving: Appears intact Executive Function: Reasoning Reasoning: Appears intact Cognition Arousal: Alert Behavior During Therapy: WFL for tasks assessed/performed Overall Cognitive Status: Impaired/Different from baseline   Physical Exam: Blood pressure 128/66, pulse 86, temperature 98.1 F (36.7 C), temperature source Oral, resp. rate 20, height 5\' 2"  (1.575 m), weight  40.4 kg, SpO2 98%. Physical Exam Vitals reviewed.  Constitutional:      Appearance: Normal appearance. She is normal weight.     Comments: Elderly pt sitting up in bedside chair eating lunch- bright affect; awake, alert, appropriate, NAD  HENT:     Head: Normocephalic and atraumatic.     Comments: Mild L facial droop clears with smile Tongue midline Facial sensation intact    Right Ear: External ear normal.     Left Ear: External ear normal.     Nose: Nose normal. No congestion.     Mouth/Throat:     Mouth: Mucous membranes are dry.     Pharynx: Oropharynx is clear. No oropharyngeal exudate.  Eyes:     General:        Right eye: No discharge.        Left eye: No discharge.     Extraocular Movements: Extraocular movements intact.  Cardiovascular:  Rate and Rhythm: Normal rate. Rhythm irregular.     Heart sounds: Normal heart sounds. No murmur heard.    No gallop.  Pulmonary:     Effort: Pulmonary effort is normal. No respiratory distress.     Breath sounds: Normal breath sounds. No wheezing, rhonchi or rales.  Abdominal:     General: Bowel sounds are normal. There is no distension.     Palpations: Abdomen is soft.     Tenderness: There is no abdominal tenderness.  Musculoskeletal:     Cervical back: Neck supple. No tenderness.     Comments: RUE 5 to 5-/5 throughout LUE 4+/5 throughout except FA 4/5 RLE_ 5/5 very strong LLE- 5-/5 throughout  Skin:    General: Skin is warm and dry.     Comments: Fingertips blackened slightly worst L 3rd digit No LE edema B/L Large bruising/ecchymoses on Ue's and some in LE's- but worst in LUE- large black bruise noted on L forearm   Neurological:     Mental Status: She is alert.     Comments: Patient is alert.HOH.  Provides name and age.  Follows commands.  Speech is dysarthric but intelligible.  No hoffman's, no clonus Intact to light touch in all 4 extremities   Psychiatric:     Comments: Bright, merry affect        Lab  Results Last 48 Hours        Results for orders placed or performed during the hospital encounter of 10/21/23 (from the past 48 hours)  Glucose, capillary     Status: Abnormal    Collection Time: 10/24/23 12:17 PM  Result Value Ref Range    Glucose-Capillary 103 (H) 70 - 99 mg/dL      Comment: Glucose reference range applies only to samples taken after fasting for at least 8 hours.  Glucose, capillary     Status: None    Collection Time: 10/24/23  4:48 PM  Result Value Ref Range    Glucose-Capillary 94 70 - 99 mg/dL      Comment: Glucose reference range applies only to samples taken after fasting for at least 8 hours.  Glucose, capillary     Status: None    Collection Time: 10/24/23  8:26 PM  Result Value Ref Range    Glucose-Capillary 99 70 - 99 mg/dL      Comment: Glucose reference range applies only to samples taken after fasting for at least 8 hours.  Glucose, capillary     Status: None    Collection Time: 10/25/23  7:34 AM  Result Value Ref Range    Glucose-Capillary 84 70 - 99 mg/dL      Comment: Glucose reference range applies only to samples taken after fasting for at least 8 hours.  Glucose, capillary     Status: None    Collection Time: 10/25/23 11:52 AM  Result Value Ref Range    Glucose-Capillary 81 70 - 99 mg/dL      Comment: Glucose reference range applies only to samples taken after fasting for at least 8 hours.  Glucose, capillary     Status: Abnormal    Collection Time: 10/25/23  4:01 PM  Result Value Ref Range    Glucose-Capillary 102 (H) 70 - 99 mg/dL      Comment: Glucose reference range applies only to samples taken after fasting for at least 8 hours.  Glucose, capillary     Status: Abnormal    Collection Time: 10/25/23  8:36 PM  Result Value  Ref Range    Glucose-Capillary 128 (H) 70 - 99 mg/dL      Comment: Glucose reference range applies only to samples taken after fasting for at least 8 hours.    Comment 1 Notify RN      Comment 2 Document in Chart     Glucose, capillary     Status: Abnormal    Collection Time: 10/25/23 11:50 PM  Result Value Ref Range    Glucose-Capillary 107 (H) 70 - 99 mg/dL      Comment: Glucose reference range applies only to samples taken after fasting for at least 8 hours.  Glucose, capillary     Status: Abnormal    Collection Time: 10/26/23  4:13 AM  Result Value Ref Range    Glucose-Capillary 119 (H) 70 - 99 mg/dL      Comment: Glucose reference range applies only to samples taken after fasting for at least 8 hours.  Glucose, capillary     Status: None    Collection Time: 10/26/23  7:28 AM  Result Value Ref Range    Glucose-Capillary 93 70 - 99 mg/dL      Comment: Glucose reference range applies only to samples taken after fasting for at least 8 hours.      Imaging Results (Last 48 hours)  No results found.         Blood pressure 128/66, pulse 86, temperature 98.1 F (36.7 C), temperature source Oral, resp. rate 20, height 5\' 2"  (1.575 m), weight 40.4 kg, SpO2 98%.   Medical Problem List and Plan: 1. Functional deficits secondary to right MCA territory infarction involving the right insula and overlying frontal parietal lobes/occlusion of distal M2 branch of the right MCA             -patient may  shower             -ELOS/Goals:  10-14 days Admit to CIR 2.  Antithrombotics: -DVT/anticoagulation:  Mechanical: Antiembolism stockings, thigh (TED hose) Bilateral lower extremities             -antiplatelet therapy: Aspirin  81 mg daily and Plavix  75 mg day x 90 days then aspirin  alone 3. Pain Management: Tylenol  as needed 4. Mood/Behavior/Sleep: Xanax  0.125-0.25 mg daily as needed, melatonin 3 mg nightly as needed             -antipsychotic agents: N/A 5. Neuropsych/cognition: This patient is capable of making decisions on her own behalf. 6. Skin/Wound Care: Routine skin checks 7. Fluids/Electrolytes/Nutrition: Routine in and outs with follow-up chemistries 8.  Atrial fibrillation.  Followed by  cardiology service Dr. Londa Rival.  Anticoagulation recently discontinued due to GI bleed.  Toprol -XL 50 mg daily.  Cardiac rate controlled 9.  Type 2 diabetes mellitus.  Hemoglobin A1c 5.2.  SSI 10.  Acute on chronic anemia.  Ferritin on 324 mg every 48 hours.  Follow-up CBC 11.  Hypothyroidism.  Synthroid  12.  Hypertension.  Continue Toprol -XL 75 mg daily, Lasix  40 mg every 48 hours alternating with 60 mg every 48 hours 13.  Constipation.  Home regimen of Colace 100 mg twice daily, Metamucil 1 packet twice daily. 14.  UGI.  Recent admission 09/22/2023 followed by Dr. Alita Irwin.  Prior endoscopy and colonoscopy showed gastritis, H. pylori, diverticulosis and hemorrhoids.  Patient follow-up outpatient GI services   Sterling Eisenmenger, PA-C 10/26/2023   I have personally performed a face to face diagnostic evaluation of this patient and formulated the key components of the plan.  Additionally, I have  personally reviewed laboratory data, imaging studies, as well as relevant notes and concur with the physician assistant's documentation above.   The patient's status has not changed from the original H&P.  Any changes in documentation from the acute care chart have been noted above.

## 2023-10-26 NOTE — H&P (Signed)
 Physical Medicine and Rehabilitation Admission H&P    Chief Complaint  Patient presents with   Fall  : HPI: Shelly Fields is a 89 year old right-handed female with history significant for atrial fibrillation off her Xarelto  due to recent concern for blood loss anemia followed by cardiology service Dr. Teddie Favre and recent admission 09/22/2023 for GI bleed followed by Dr. Alita Irwin requiring transfusion of 3 units, hypothyroidism, type 2 diabetes mellitus, hypertension and Anxiety as well as severe Hard of hearing.  Per chart review patient lives alone.  1 level home one-step to entry.  Reportedly independent driving prior to admission with an occasional rolling walker.  Presented to [facility] 10/21/2023 after backwards into her nightstand.  When family found her she was noted to have left-sided weakness and was dragging her left leg.  MRI showed acute right MCA territory infarction involving the right insula and overlying frontal parietal lobes.  No substantial mass effect.  CT angiogram head and neck showed occlusion of distal M2 branch of the right MCA.  No hemodynamically significant stenosis of the carotid arteries.  Admission chemistries unremarkable except hemoglobin 11.6, sodium 132, hemoglobin A1c 5.2, total CK within normal limits.  Echocardiogram with ejection fraction 70 to 75% and mild left ventricular hypertrophy.  Neurology follow-up patient was cleared to begin aspirin  81 mg daily and Plavix  75 mg daily for CVA prophylaxis x 90 days then aspirin  alone.  Tolerating regular consistency diet.  Therapy evaluations completed due to patient decreased functional mobility left-sided weakness was admitted for a comprehensive rehab program.  Pt reports uses a RW normally.  She denies any visual changes, any N/V- her L posterior ribs are sore due to her fall backwards at home. She took tylenol  for them. LBM yesterday with Metamucil and stool softener.   She notes that fingertips are  "black" due to blood draws.    Review of Systems  Constitutional:  Negative for chills and fever.  HENT:  Positive for hearing loss.   Eyes:  Negative for blurred vision and double vision.  Respiratory:  Negative for cough, shortness of breath and wheezing.   Cardiovascular:  Positive for palpitations. Negative for chest pain and PND.  Gastrointestinal:  Negative for constipation, heartburn, nausea and vomiting.  Genitourinary:  Negative for dysuria, flank pain and hematuria.  Musculoskeletal:  Positive for falls and myalgias.  Skin:  Negative for rash.  Neurological:  Positive for weakness.  Psychiatric/Behavioral:         Anxiety  All other systems reviewed and are negative.  Past Medical History:  Diagnosis Date   Cor pulmonale (chronic) (HCC)    Diverticulosis    Essential hypertension    Hypothyroidism    Internal hemorrhoids    Persistent atrial fibrillation (HCC)    Pulmonary hypertension (HCC)    Seasonal allergies    Shingles    Past Surgical History:  Procedure Laterality Date   ABDOMINAL HYSTERECTOMY     BIOPSY  03/25/2023   Procedure: BIOPSY;  Surgeon: Hargis Lias, MD;  [facility];  Service: Endoscopy;;   CATARACT EXTRACTION     COLONOSCOPY N/A 01/12/2013   Procedure: COLONOSCOPY;  Surgeon: Ruby Corporal, MD;  Location: [facility];  Service: Endoscopy;  Laterality: N/A;  1030   COLONOSCOPY N/A 11/18/2017   Procedure: COLONOSCOPY;  Surgeon: Ruby Corporal, MD;  [facility]: [facility];  Service: Endoscopy;  Laterality: N/A;   COLONOSCOPY WITH PROPOFOL  N/A 03/25/2023   Procedure: COLONOSCOPY  WITH PROPOFOL ;  Surgeon: Hargis Lias, MD;  [facility]: [facility];  Service: Endoscopy;  Laterality: N/A;  11:00AM;ASA 3   ESOPHAGOGASTRODUODENOSCOPY (EGD) WITH PROPOFOL  N/A 03/25/2023   Procedure: ESOPHAGOGASTRODUODENOSCOPY (EGD) WITH PROPOFOL ;  Surgeon: Hargis Lias, MD;  [facility]: [facility];  Service: Endoscopy;  Laterality: N/A;   11:00;ASA 3   LESION REMOVAL Right 09/13/2023   Procedure: excision of right lower extremity squamous cell carcinoma;  Surgeon: Marijo Shove, DO;  Location: [facility];  Service: General;  Laterality: Right;   POLYPECTOMY  11/18/2017   Procedure: POLYPECTOMY;  Surgeon: Ruby Corporal, MD;  [facility];  Service: Endoscopy;;  colon   Family History  Problem Relation Age of Onset   Heart disease Mother    Hypertension Father    Colon cancer Neg Hx    Social History:  reports that she has never smoked. She has been exposed to tobacco smoke. She has never used smokeless tobacco. She reports that she does not drink alcohol and does not use drugs. Allergies: No Known Allergies Medications Prior to Admission  Medication Sig Dispense Refill   Carboxymethylcellulose Sodium (THERATEARS OP) Place 1 drop into both eyes daily.      dapagliflozin  propanediol (FARXIGA ) 10 MG TABS tablet Take 1 tablet (10 mg total) by mouth daily before breakfast. 30 tablet 11   denosumab  (PROLIA ) 60 MG/ML SOSY injection Inject 60 mg into the skin every 6 (six) months.     docusate sodium  (COLACE) 100 MG capsule Take 1 capsule (100 mg total) by mouth 2 (two) times daily. 60 capsule 2   ferrous gluconate  (FERGON) 324 MG tablet Take 1 tablet (324 mg total) by mouth every other day.     furosemide  (LASIX ) 40 MG tablet Take 40 mg Daily alternating with 60 mg Daily (Patient taking differently: Take 40-60 mg by mouth daily. Take 40 mg Daily alternating with 60 mg Daily)     levothyroxine  (SYNTHROID ) 75 MCG tablet Take 75 mcg by mouth daily before breakfast.     metoprolol  succinate (TOPROL -XL) 50 MG 24 hr tablet TAKE 1 AND 1/2 TABLETS BY MOUTH  TWICE DAILY 300 tablet 2   potassium chloride  SA (KLOR-CON  M) 20 MEQ tablet Take 1 tablet (20 mEq total) by mouth daily. 90 tablet 1   psyllium (METAMUCIL) 58.6 % packet Take 1 packet by mouth 2 (two) times daily. (Patient taking differently: Take 1 packet by mouth at  bedtime.) 30 each 12   [DISCONTINUED] ALPRAZolam  (XANAX ) 0.25 MG tablet Take 0.125-0.25 mg by mouth daily as needed for anxiety or sleep.        Home: Home Living Family/patient expects to be discharged to:: Private residence Living Arrangements: Alone Available Help at Discharge: Family, Available 24 hours/day Type of Home: House Home Access: Level entry Entrance Stairs-Number of Steps: 1 Entrance Stairs-Rails: None Home Layout: One level Bathroom Shower/Tub: Engineer, manufacturing systems: Standard Bathroom Accessibility: No Home Equipment: Agricultural consultant (2 wheels), BSC/3in1 Additional Comments: Pt lives beside family.  Lives With: Alone   Functional History: Prior Function Prior Level of Function : Independent/Modified Independent Mobility Comments: using RW in the morning but not all day for ambulation ADLs Comments: Independent; drives  Functional Status:  Mobility: Bed Mobility Overal bed mobility: Needs Assistance Bed Mobility: Supine to Sit Supine to sit: Min assist General bed mobility comments: increased time, labored movement Transfers Overall transfer level: Needs assistance Equipment used: Rolling walker (2 wheels) Transfers: Sit to/from Stand, Bed to chair/wheelchair/BSC  Sit to Stand: Min assist Bed to/from chair/wheelchair/BSC transfer type:: Step pivot Step pivot transfers: Mod assist General transfer comment: needs assistance for safety; Lt LE buckles at times due to weakness Ambulation/Gait Ambulation/Gait assistance: Min assist Gait Distance (Feet): 100 Feet Assistive device: Rolling walker (2 wheels) Gait Pattern/deviations: Decreased step length - right, Decreased step length - left, Decreased stride length, Antalgic, Knees buckling, Drifts right/left General Gait Details: slow labored movement with frequent drifting/leaning to the left with buckling of LLE once fatigued Gait velocity: decreased Pre-gait activities: pt with noted buckling of  LT LE with ambulation resulting in antalgic gait    ADL: ADL Overall ADL's : Needs assistance/impaired Eating/Feeding: Minimal assistance, Set up, Sitting Grooming: Set up, Minimal assistance, Sitting Upper Body Bathing: Minimal assistance, Sitting Lower Body Bathing: Minimal assistance, Sitting/lateral leans Upper Body Dressing : Minimal assistance, Sitting Lower Body Dressing: Minimal assistance, Sitting/lateral leans Lower Body Dressing Details (indicate cue type and reason): Able to reach B LE but difficulty coordinating L hand. Toilet Transfer: Moderate assistance, Rolling walker (2 wheels), Ambulation, Stand-pivot Toilet Transfer Details (indicate cue type and reason): Simualted via EOB to chair and ambulation in the hall. Toileting- Clothing Manipulation and Hygiene: Minimal assistance, Moderate assistance, Sitting/lateral lean Tub/ Shower Transfer: Moderate assistance, Ambulation, Rolling walker (2 wheels) Functional mobility during ADLs: Moderate assistance, Rolling walker (2 wheels) General ADL Comments: Able to walk in hall with RW but required seated rest in w/c due to L LE seeming to get weaker. Assist needed to steer RW.  Cognition: Cognition Overall Cognitive Status: Impaired/Different from baseline Orientation Level: Oriented X4 Attention: Focused Focused Attention: Appears intact Memory: Appears intact Awareness: Impaired Awareness Impairment: Intellectual impairment Problem Solving: Appears intact Executive Function: Reasoning Reasoning: Appears intact Cognition Arousal: Alert Behavior During Therapy: WFL for tasks assessed/performed Overall Cognitive Status: Impaired/Different from baseline  Physical Exam: Blood pressure 128/66, pulse 86, temperature 98.1 F (36.7 C), temperature source Oral, resp. rate 20, height 5\' 2"  (1.575 m), weight 40.4 kg, SpO2 98%. Physical Exam Vitals reviewed.  Constitutional:      Appearance: Normal appearance. She is normal  weight.     Comments: Elderly pt sitting up in bedside chair eating lunch- bright affect; awake, alert, appropriate, NAD  HENT:     Head: Normocephalic and atraumatic.     Comments: Mild L facial droop clears with smile Tongue midline Facial sensation intact    Right Ear: External ear normal.     Left Ear: External ear normal.     Nose: Nose normal. No congestion.     Mouth/Throat:     Mouth: Mucous membranes are dry.     Pharynx: Oropharynx is clear. No oropharyngeal exudate.  Eyes:     General:        Right eye: No discharge.        Left eye: No discharge.     Extraocular Movements: Extraocular movements intact.  Cardiovascular:     Rate and Rhythm: Normal rate. Rhythm irregular.     Heart sounds: Normal heart sounds. No murmur heard.    No gallop.  Pulmonary:     Effort: Pulmonary effort is normal. No respiratory distress.     Breath sounds: Normal breath sounds. No wheezing, rhonchi or rales.  Abdominal:     General: Bowel sounds are normal. There is no distension.     Palpations: Abdomen is soft.     Tenderness: There is no abdominal tenderness.  Musculoskeletal:     Cervical back: Neck supple.  No tenderness.     Comments: RUE 5 to 5-/5 throughout LUE 4+/5 throughout except FA 4/5 RLE_ 5/5 very strong LLE- 5-/5 throughout  Skin:    General: Skin is warm and dry.     Comments: Fingertips blackened slightly worst L 3rd digit No LE edema B/L Large bruising/ecchymoses on Ue's and some in LE's- but worst in LUE- large black bruise noted on L forearm   Neurological:     Mental Status: She is alert.     Comments: Patient is alert.HOH.  Provides name and age.  Follows commands.  Speech is dysarthric but intelligible.  No hoffman's, no clonus Intact to light touch in all 4 extremities   Psychiatric:     Comments: Bright, merry affect     Results for orders placed or performed during the hospital encounter of 10/21/23 (from the past 48 hours)  Glucose, capillary      Status: Abnormal   Collection Time: 10/24/23 12:17 PM  Result Value Ref Range   Glucose-Capillary 103 (H) 70 - 99 mg/dL    Comment: Glucose reference range applies only to samples taken after fasting for at least 8 hours.  Glucose, capillary     Status: None   Collection Time: 10/24/23  4:48 PM  Result Value Ref Range   Glucose-Capillary 94 70 - 99 mg/dL    Comment: Glucose reference range applies only to samples taken after fasting for at least 8 hours.  Glucose, capillary     Status: None   Collection Time: 10/24/23  8:26 PM  Result Value Ref Range   Glucose-Capillary 99 70 - 99 mg/dL    Comment: Glucose reference range applies only to samples taken after fasting for at least 8 hours.  Glucose, capillary     Status: None   Collection Time: 10/25/23  7:34 AM  Result Value Ref Range   Glucose-Capillary 84 70 - 99 mg/dL    Comment: Glucose reference range applies only to samples taken after fasting for at least 8 hours.  Glucose, capillary     Status: None   Collection Time: 10/25/23 11:52 AM  Result Value Ref Range   Glucose-Capillary 81 70 - 99 mg/dL    Comment: Glucose reference range applies only to samples taken after fasting for at least 8 hours.  Glucose, capillary     Status: Abnormal   Collection Time: 10/25/23  4:01 PM  Result Value Ref Range   Glucose-Capillary 102 (H) 70 - 99 mg/dL    Comment: Glucose reference range applies only to samples taken after fasting for at least 8 hours.  Glucose, capillary     Status: Abnormal   Collection Time: 10/25/23  8:36 PM  Result Value Ref Range   Glucose-Capillary 128 (H) 70 - 99 mg/dL    Comment: Glucose reference range applies only to samples taken after fasting for at least 8 hours.   Comment 1 Notify RN    Comment 2 Document in Chart   Glucose, capillary     Status: Abnormal   Collection Time: 10/25/23 11:50 PM  Result Value Ref Range   Glucose-Capillary 107 (H) 70 - 99 mg/dL    Comment: Glucose reference range applies only  to samples taken after fasting for at least 8 hours.  Glucose, capillary     Status: Abnormal   Collection Time: 10/26/23  4:13 AM  Result Value Ref Range   Glucose-Capillary 119 (H) 70 - 99 mg/dL    Comment: Glucose reference range applies only  to samples taken after fasting for at least 8 hours.  Glucose, capillary     Status: None   Collection Time: 10/26/23  7:28 AM  Result Value Ref Range   Glucose-Capillary 93 70 - 99 mg/dL    Comment: Glucose reference range applies only to samples taken after fasting for at least 8 hours.   No results found.    Blood pressure 128/66, pulse 86, temperature 98.1 F (36.7 C), temperature source Oral, resp. rate 20, height 5\' 2"  (1.575 m), weight 40.4 kg, SpO2 98%.  Medical Problem List and Plan: 1. Functional deficits secondary to right MCA territory infarction involving the right insula and overlying frontal parietal lobes/occlusion of distal M2 branch of the right MCA  -patient may  shower  -ELOS/Goals:  10-14 days Admit to CIR 2.  Antithrombotics: -DVT/anticoagulation:  Mechanical: Antiembolism stockings, thigh (TED hose) Bilateral lower extremities  -antiplatelet therapy: Aspirin  81 mg daily and Plavix  75 mg day x 90 days then aspirin  alone 3. Pain Management: Tylenol  as needed 4. Mood/Behavior/Sleep: Xanax  0.125-0.25 mg daily as needed, melatonin 3 mg nightly as needed  -antipsychotic agents: N/A 5. Neuropsych/cognition: This patient is capable of making decisions on her own behalf. 6. Skin/Wound Care: Routine skin checks 7. Fluids/Electrolytes/Nutrition: Routine in and outs with follow-up chemistries 8.  Atrial fibrillation.  Followed by cardiology service Dr. Londa Rival.  Anticoagulation recently discontinued due to GI bleed.  Toprol -XL 50 mg daily.  Cardiac rate controlled 9.  Type 2 diabetes mellitus.  Hemoglobin A1c 5.2.  SSI 10.  Acute on chronic anemia.  Ferritin on 324 mg every 48 hours.  Follow-up CBC 11.  Hypothyroidism.   Synthroid  12.  Hypertension.  Continue Toprol -XL 75 mg daily, Lasix  40 mg every 48 hours alternating with 60 mg every 48 hours 13.  Constipation.  Home regimen of Colace 100 mg twice daily, Metamucil 1 packet twice daily. 14.  UGI.  Recent admission 09/22/2023 followed by Dr. Alita Irwin.  Prior endoscopy and colonoscopy showed gastritis, H. pylori, diverticulosis and hemorrhoids.  Patient follow-up outpatient GI services  Sterling Eisenmenger, PA-C 10/26/2023  I have personally performed a face to face diagnostic evaluation of this patient and formulated the key components of the plan.  Additionally, I have personally reviewed laboratory data, imaging studies, as well as relevant notes and concur with the physician assistant's documentation above.   The patient's status has not changed from the original H&P.  Any changes in documentation from the acute care chart have been noted above.

## 2023-10-26 NOTE — Plan of Care (Signed)
  Problem: Clinical Measurements: Goal: Ability to maintain clinical measurements within normal limits will improve Outcome: Progressing Goal: Will remain free from infection Outcome: Progressing Goal: Diagnostic test results will improve Outcome: Progressing Goal: Respiratory complications will improve Outcome: Progressing Goal: Cardiovascular complication will be avoided Outcome: Progressing   Problem: Activity: Goal: Risk for activity intolerance will decrease Outcome: Progressing   Problem: Nutrition: Goal: Adequate nutrition will be maintained Outcome: Progressing   Problem: Coping: Goal: Level of anxiety will decrease Outcome: Progressing   Problem: Pain Managment: Goal: General experience of comfort will improve and/or be controlled Outcome: Progressing   Problem: Safety: Goal: Ability to remain free from injury will improve Outcome: Progressing

## 2023-10-26 NOTE — Progress Notes (Addendum)
 Inpatient Rehabilitation Admissions Coordinator   I have insurance approval and CIR bed to admit her to today. I contacted son, [name], at bedside by phone and he is aware and in agreement. Estimated cost of care reviewed with him. [facility] staff to completed Care link paperwork. I will verify bed number and arrange Care link transport for today. Acute team and TOC made aware.  Jeannetta Millman, RN, MSN Rehab Admissions Coordinator [phone] 10/26/2023 9:29 AM

## 2023-10-26 NOTE — Progress Notes (Addendum)
 Shelly Coles, MD  Physician Physical Medicine and Rehabilitation   PMR Pre-admission    Signed   Date of Service: 10/23/2023 11:17 AM  Related encounter: ED to Hosp-Admission (Discharged) from 10/21/2023 in [facility] SURGICAL UNIT   Signed     Expand All Collapse All  Show:Clear all [x] Written[x] Templated[x] Copied  Added by: [x] Shelly Gandy, RN[x] Shelly Fields, Shelly Fields, CCC-SLP[x] Shelly Coles, MD  [] Hover for details PMR Admission Coordinator Pre-Admission Assessment   Patient: Shelly Fields is an 89 y.o., female MRN: [id number] DOB: [birth year] Height: 5\' 2"  (157.5 cm) Weight: 40.4 kg   Insurance Information HMO: yes    PPO:      PCP:      IPA:      80/20:      OTHER:  PRIMARY: [employer] Medicare      Policy#: [id number]      Subscriber: patient CM Name: Shelly Fields      Phone#: [phone] option 3     Fax#: [phone] Pre-Cert#: [id number]  Received insurance approval from [place] on 10/25/23. Pt approved from 10/26/23-11/01/23. Review date is 11/01/23.    Employer:  Benefits:  Phone #: [phone]     Name:  Eff. Date: 06/09/23-06/07/24     Deduct: does not have one      Out of Pocket Max: $3,900 ($80 met)      Life Max: NA CIR: $295/day co-pay for days 1-5, 100% coverage for days 6+      SNF: 100% coverage for days 1-20, $203 co-pay/day for days 21-100 Outpatient: $20/visit co-pay     Co-Pay:  Home Health: 100% coverage      Co-Pay:  DME: 80% coverage     Co-Pay: 20% co-insurance Providers: in-network SECONDARY:       Policy#:      Phone#:    Artist:       Phone#:    The Data processing manager" for patients in Inpatient Rehabilitation Facilities with attached "Privacy Act Statement-Health Care Records" was provided and verbally reviewed with: Family   Emergency Contact Information Contact Information       Name Relation Home Work Mobile    [place] Son [phone]        Maraya, Gwilliam [phone]    [phone]         Other Contacts   None on File        Current Medical History  Patient Admitting Diagnosis: CVA   History of Present Illness:  Shelly Fields is a 89 year old right-handed female with history significant for atrial fibrillation off her Xarelto  due to recent concern for blood loss anemia followed by cardiology service Dr. Teddie Favre and recent admission 09/22/2023 for GI bleed followed by Dr. Alita Irwin requiring transfusion of 3 units, hypothyroidism, type 2 diabetes mellitus, hypertension.  Per chart review patient lives alone.  1 level home one-step to entry.  Reportedly independent driving prior to admission with an occasional rolling walker.  Presented to [facility] 10/21/2023 after backwards fall into her nightstand.  When family found her she was noted to have left-sided weakness and was dragging her left leg.     MRI showed acute right MCA territory infarction involving the right insula and overlying frontal parietal lobes.  No substantial mass effect.  CT angiogram head and neck showed occlusion of distal M2 branch of the right MCA.  No hemodynamically significant stenosis of the carotid arteries.  Admission chemistries unremarkable except hemoglobin 11.6, sodium 132, hemoglobin A1c 5.2, total  CK within normal limits.  Echocardiogram with ejection fraction 70 to 75% and mild left ventricular hypertrophy.  Neurology follow-up patient was cleared to begin aspirin  81 mg daily and Plavix  75 mg daily for CVA prophylaxis x 90 days then aspirin  alone.  Tolerating regular consistency diet    Complete NIHSS TOTAL: 1   Patient's medical record from [facility] has been reviewed by the rehabilitation admission coordinator and physician.   Past Medical History      Past Medical History:  Diagnosis Date   Cor pulmonale (chronic) (HCC)     Diverticulosis     Essential hypertension     Hypothyroidism     Internal hemorrhoids     Persistent atrial  fibrillation (HCC)     Pulmonary hypertension (HCC)     Seasonal allergies     Shingles          Has the patient had major surgery during 100 days prior to admission? Yes   Family History   family history includes Heart disease in her mother; Hypertension in her father.   Current Medications  Current Medications    Current Facility-Administered Medications:    acetaminophen  (TYLENOL ) tablet 650 mg, 650 mg, Oral, Q6H PRN **OR** acetaminophen  (TYLENOL ) suppository 650 mg, 650 mg, Rectal, Q6H PRN, Adefeso, Oladapo, DO   ALPRAZolam  (XANAX ) tablet 0.125-0.25 mg, 0.125-0.25 mg, Oral, Daily PRN, Adefeso, Oladapo, DO, 0.25 mg at 10/23/23 2119   aspirin  EC tablet 81 mg, 81 mg, Oral, Daily, Mason Sole, Pratik D, DO, 81 mg at 10/26/23 0857   clopidogrel  (PLAVIX ) tablet 75 mg, 75 mg, Oral, Daily, Mason Sole, Pratik D, DO, 75 mg at 10/26/23 0856   docusate sodium  (COLACE) capsule 100 mg, 100 mg, Oral, BID, Adefeso, Oladapo, DO, 100 mg at 10/26/23 0857   ferrous gluconate  (FERGON) tablet 324 mg, 324 mg, Oral, Q48H, Shah, Pratik D, DO, 324 mg at 10/26/23 0857   furosemide  (LASIX ) tablet 40 mg, 40 mg, Oral, Q48H, Shah, Pratik D, DO, 40 mg at 10/26/23 0857   furosemide  (LASIX ) tablet 60 mg, 60 mg, Oral, Q48H, Madueme, Elvira C, RPH, 60 mg at 10/25/23 1016   insulin  aspart (novoLOG ) injection 0-9 Units, 0-9 Units, Subcutaneous, TID WC, Adefeso, Oladapo, DO   levothyroxine  (SYNTHROID ) tablet 75 mcg, 75 mcg, Oral, Q0600, Adefeso, Oladapo, DO, 75 mcg at 10/26/23 2130   melatonin tablet 3 mg, 3 mg, Oral, QHS PRN, Adefeso, Oladapo, DO, 3 mg at 10/25/23 2105   metoprolol  succinate (TOPROL -XL) 24 hr tablet 50 mg, 50 mg, Oral, Daily, Mason Sole, Pratik D, DO, 50 mg at 10/25/23 1021   ondansetron  (ZOFRAN ) tablet 4 mg, 4 mg, Oral, Q6H PRN **OR** ondansetron  (ZOFRAN ) injection 4 mg, 4 mg, Intravenous, Q6H PRN, Adefeso, Oladapo, DO   psyllium (HYDROCIL/METAMUCIL) 1 packet, 1 packet, Oral, BID, Adefeso, Oladapo, DO, 1 packet at  10/26/23 0858     Patients Current Diet:  Diet Order                  Diet - low sodium heart healthy             Diet - low sodium heart healthy             Diet heart healthy/carb modified Room service appropriate? Yes; Fluid consistency: Thin  Diet effective now                       Precautions / Restrictions Precautions Precautions: Fall Restrictions Weight Bearing Restrictions Per Provider  Order: No    Has the patient had 2 or more falls or a fall with injury in the past year? Yes   Prior Activity Level Community (5-7x/wk): gets out of house ~4 days/week; drives   Prior Functional Level Self Care: Did the patient need help bathing, dressing, using the toilet or eating? Independent   Indoor Mobility: Did the patient need assistance with walking from room to room (with or without device)? Independent   Stairs: Did the patient need assistance with internal or external stairs (with or without device)? Unknown (pt avoids stairs)   Functional Cognition: Did the patient need help planning regular tasks such as shopping or remembering to take medications? Independent   Patient Information Are you of Hispanic, Latino/a,or Spanish origin?: X. Patient unable to respond, A. No, not of Hispanic, Latino/a, or Spanish origin What is your race?: X. Patient unable to respond, A. White Do you need or want an interpreter to communicate with a doctor or health care staff?: 9. Unable to respond   Patient's Response To:  Health Literacy and Transportation Is the patient able to respond to health literacy and transportation needs?: No Health Literacy - How often do you need to have someone help you when you read instructions, pamphlets, or other written material from your doctor or pharmacy?: Patient unable to respond   Home Assistive Devices / Equipment Home Equipment: Agricultural consultant (2 wheels), BSC/3in1   Prior Device Use: Indicate devices/aids used by the patient prior to  current illness, exacerbation or injury? Walker (uses occasionally)   Current Functional Level Cognition   Overall Cognitive Status: Impaired/Different from baseline Orientation Level: Oriented X4 Attention: Focused Focused Attention: Appears intact Memory: Appears intact Awareness: Impaired Awareness Impairment: Intellectual impairment Problem Solving: Appears intact Executive Function: Reasoning Reasoning: Appears intact    Extremity Assessment (includes Sensation/Coordination)   Upper Extremity Assessment: Defer to OT evaluation LUE Deficits / Details: 3-/5 shoulder flexion; 3+/5 elbow, wrist, and grasp. Poor fine and gross motor coordination. LUE Sensation: WNL LUE Coordination: decreased gross motor, decreased fine motor  Lower Extremity Assessment: RLE deficits/detail, LLE deficits/detail RLE Deficits / Details: grossly -5/10 RLE Sensation: WNL RLE Coordination: WNL LLE Deficits / Details: grossly 3/5 LLE Sensation: WNL LLE Coordination: decreased fine motor, decreased gross motor     ADLs   Overall ADL's : Needs assistance/impaired Eating/Feeding: Minimal assistance, Set up, Sitting Grooming: Set up, Minimal assistance, Sitting Upper Body Bathing: Minimal assistance, Sitting Lower Body Bathing: Minimal assistance, Sitting/lateral leans Upper Body Dressing : Minimal assistance, Sitting Lower Body Dressing: Minimal assistance, Sitting/lateral leans Lower Body Dressing Details (indicate cue type and reason): Able to reach B LE but difficulty coordinating L hand. Toilet Transfer: Moderate assistance, Rolling walker (2 wheels), Ambulation, Stand-pivot Toilet Transfer Details (indicate cue type and reason): Simualted via EOB to chair and ambulation in the hall. Toileting- Clothing Manipulation and Hygiene: Minimal assistance, Moderate assistance, Sitting/lateral lean Tub/ Shower Transfer: Moderate assistance, Ambulation, Rolling walker (2 wheels) Functional mobility during  ADLs: Moderate assistance, Rolling walker (2 wheels) General ADL Comments: Able to walk in hall with RW but required seated rest in w/c due to L LE seeming to get weaker. Assist needed to steer RW.     Mobility   Overal bed mobility: Needs Assistance Bed Mobility: Supine to Sit Supine to sit: Min assist General bed mobility comments: increased time, labored movement     Transfers   Overall transfer level: Needs assistance Equipment used: Rolling walker (2 wheels) Transfers: Sit to/from  Stand, Bed to chair/wheelchair/BSC Sit to Stand: Min assist Bed to/from chair/wheelchair/BSC transfer type:: Step pivot Step pivot transfers: Mod assist General transfer comment: needs assistance for safety; Lt LE buckles at times due to weakness     Ambulation / Gait / Stairs / Wheelchair Mobility   Ambulation/Gait Ambulation/Gait assistance: Editor, commissioning (Feet): 100 Feet Assistive device: Rolling walker (2 wheels) Gait Pattern/deviations: Decreased step length - right, Decreased step length - left, Decreased stride length, Antalgic, Knees buckling, Drifts right/left General Gait Details: slow labored movement with frequent drifting/leaning to the left with buckling of LLE once fatigued Gait velocity: decreased Pre-gait activities: pt with noted buckling of LT LE with ambulation resulting in antalgic gait     Posture / Balance Dynamic Sitting Balance Sitting balance - Comments: seated at EOB Balance Overall balance assessment: Needs assistance Sitting-balance support: Feet supported, No upper extremity supported Sitting balance-Leahy Scale: Poor Sitting balance - Comments: seated at EOB Postural control: Left lateral lean Standing balance support: During functional activity, Bilateral upper extremity supported, Reliant on assistive device for balance Standing balance-Leahy Scale: Poor Standing balance comment: using RW     Special needs/care consideration Skin Abrasion: back/left;  Ecchymosis: arm, back/bilateral; Erythema/Redness: back/left and Diabetic management Novolog  0-9 units 3x daily with meals    Previous Home Environment  Living Arrangements: Alone  Lives With: Alone Available Help at Discharge: Family, Available 24 hours/day Type of Home: House Home Layout: One level Home Access: Level entry Entrance Stairs-Rails: None Entrance Stairs-Number of Steps: 1 Bathroom Shower/Tub: Engineer, manufacturing systems: Standard Bathroom Accessibility: No How Accessible: Accessible via walker Home Care Services: No Additional Comments: Pt lives beside family.   Discharge Living Setting Plans for Discharge Living Setting: Patient's home Type of Home at Discharge: House Discharge Home Layout: One level Discharge Home Access: Level entry Discharge Bathroom Shower/Tub: Tub/shower unit Discharge Bathroom Toilet: Standard Discharge Bathroom Accessibility: No Does the patient have any problems obtaining your medications?: No   Social/Family/Support Systems Anticipated Caregiver: Zaliah Wissner (son) and other family Anticipated Caregiver's Contact Information: [name]: [phone] Caregiver Availability: 24/7 Discharge Plan Discussed with Primary Caregiver: Yes Is Caregiver In Agreement with Plan?: Yes Does Caregiver/Family have Issues with Lodging/Transportation while Pt is in Rehab?: No   Goals Patient/Family Goal for Rehab: Supervision: PT/OT Expected length of stay: 7-10 days Pt/Family Agrees to Admission and willing to participate: Yes Program Orientation Provided & Reviewed with Pt/Caregiver Including Roles  & Responsibilities: Yes   Decrease burden of Care through IP rehab admission: NA   Possible need for SNF placement upon discharge: Not anticipated   Patient Condition: I have reviewed medical records from [facility], spoken with CM, and son. I discussed via phone for inpatient rehabilitation assessment.  Patient will benefit from ongoing PT,  OT, and SLP, can actively participate in 3 hours of therapy a day 5 days of the week, and can make measurable gains during the admission.  Patient will also benefit from the coordinated team approach during an Inpatient Acute Rehabilitation admission.  The patient will receive intensive therapy as well as Rehabilitation physician, nursing, social worker, and care management interventions.  Due to safety, skin/wound care, disease management, medication administration, pain management, and patient education the patient requires 24 hour a day rehabilitation nursing.  The patient is currently min assist overall with mobility and basic ADLs.  Discharge setting and therapy post discharge at home with home health is anticipated.  Patient has agreed to participate in the Acute Inpatient  Rehabilitation Program and will admit today.   Preadmission Screen Completed By:  Amiel Kalata, 10/26/2023 9:40 AM ______________________________________________________________________   Discussed status with Dr. Raynaldo Call  on 10/26/23 at [phone] and received approval for admission today.   Admission Coordinator:  Al Hover, time [id number] Date 10/26/23    Assessment/Plan: Diagnosis: R MCA stroke Does the need for close, 24 hr/day Medical supervision in concert with the patient's rehab needs make it unreasonable for this patient to be served in a less intensive setting? Yes Co-Morbidities requiring supervision/potential complications: Afib, Anxiety, Hypothyroid, DM- A1c 5.2 Due to bladder management, bowel management, safety, skin/wound care, disease management, medication administration, pain management, and patient education, does the patient require 24 hr/day rehab nursing? Yes Does the patient require coordinated care of a physician, rehab nurse, PT, OT, and SLP to address physical and functional deficits in the context of the above medical diagnosis(es)? Yes Addressing deficits in the following areas:  balance, endurance, locomotion, strength, transferring, bowel/bladder control, bathing, dressing, feeding, grooming, and toileting Can the patient actively participate in an intensive therapy program of at least 3 hrs of therapy 5 days a week? Yes The potential for patient to make measurable gains while on inpatient rehab is good Anticipated functional outcomes upon discharge from inpatient rehab: supervision PT, supervision OT, n/a SLP Estimated rehab length of stay to reach the above functional goals is: 7-10 days Anticipated discharge destination: Home 10. Overall Rehab/Functional Prognosis: good     MD Signature:            Revision History

## 2023-10-26 NOTE — Progress Notes (Signed)
 Inpatient Rehabilitation Admission Medication Review by a Pharmacist  A complete drug regimen review was completed for this patient to identify any potential clinically significant medication issues.  High Risk Drug Classes Is patient taking? Indication by Medication  Antipsychotic No   Anticoagulant No   Antibiotic No   Opioid No   Antiplatelet Yes bASA, plavix - CVA (planning on 90 days, then bASA alone)   Hypoglycemics/insulin  Yes Insulin - DM   Vasoactive Medication Yes Furosemide , Toprol - AF, edema  Chemotherapy No   Other Yes docusate  and - constipation Acetaminophen - pain  Zofran - n/v   melatonin and -insomnia Synthroid - hypothyroidism  Xanax  PRN- anxiety  Ferrous gluconate - supplement Psyllium- bulking      Type of Medication Issue Identified Description of Issue Recommendation(s)  Drug Interaction(s) (clinically significant)     Duplicate Therapy     Allergy      No Medication Administration End Date     Incorrect Dose     Additional Drug Therapy Needed     Significant med changes from prior encounter (inform family/care partners about these prior to discharge). PTA meds not resumed- dapagliflozin , dapagliflozin    Dose changes from PTA- 75 mg  Communicate relevant medication changes to patient/family members at discharge from CIR.   Restart or discontinue PTA meds not resumed in CIR at discharge if clinically indicated.   Other       Clinically significant medication issues were identified that warrant physician communication and completion of prescribed/recommended actions by midnight of the next day:  No  Name of provider notified for urgent issues identified:   Provider Method of Notification:     Pharmacist comments:   Time spent performing this drug regimen review (minutes):  30   Chrystie Crass, PharmD Clinical Pharmacist  10/26/2023 12:54 PM

## 2023-10-26 NOTE — Progress Notes (Signed)
 Report given to nurse Peterson Brandt, no further questions at this time, awaiting arrival of carelink at this time.

## 2023-10-26 NOTE — Discharge Summary (Addendum)
 Physician Discharge Summary  CZARINA GINGRAS ZOX:[id number] DOB: August 10, 1934 DOA: 10/21/2023  PCP: Orlena Bitters, MD  Admit date: 10/21/2023  Discharge date: 10/26/2023  Admitted From:Home  Disposition:  CIR  Recommendations for Outpatient Follow-up:  Follow up with PCP in 1-2 weeks Continue on DAPT for 90 days and then aspirin  monotherapy thereafter No indication for statin since LDL is at goal Follow-up with neurology outpatient with referral sent Continue other medications as noted below  Home Health: None  Equipment/Devices: None  Discharge Condition:Stable  CODE STATUS: DNR  Diet recommendation: Heart Healthy/carb modified  Brief/Interim Summary:  Shelly Fields is an 89 y.o. female with medical history significant of atrial fibrillation, hypothyroidism, T2DM who presents to the emergency department from home via EMS due to fall sustained at home at night and she was found the following day in the afternoon by family members.  She was noted to have significant left-sided weakness especially to her left leg.  She was admitted for evaluation of acute ischemic CVA.  Brain MRI demonstrates right MCA CVA and neurology recommending DAPT x 90 days and then aspirin  monotherapy after.  She has been assessed by physical therapy with recommendations for CIR and now has a bed available and will discharge today.  No other acute events or concerns noted throughout the course of this admission.  Discharge Diagnoses:  Principal Problem:   Acute ischemic stroke [facility]) Active Problems:   Hypothyroidism, acquired   Persistent atrial fibrillation (HCC)   Constipation   Controlled type 2 diabetes mellitus without complication, without long-term current use of insulin  (HCC)   Anxiety   Insomnia   Fall at home, initial encounter   Abrasion of back  Principal discharge diagnosis: Acute ischemic right MCA distribution CVA.  Discharge Instructions  Discharge Instructions     Ambulatory  referral to Neurology   Complete by: As directed    An appointment is requested in approximately: 8 weeks   Diet - low sodium heart healthy   Complete by: As directed    Diet - low sodium heart healthy   Complete by: As directed    Increase activity slowly   Complete by: As directed    Increase activity slowly   Complete by: As directed    No wound care   Complete by: As directed    No wound care   Complete by: As directed       Allergies as of 10/26/2023   No Known Allergies      Medication List     TAKE these medications    ALPRAZolam  0.25 MG tablet Commonly known as: XANAX  Take 0.5-1 tablets (0.125-0.25 mg total) by mouth daily as needed for anxiety or sleep.   aspirin  EC 81 MG tablet Take 1 tablet (81 mg total) by mouth daily. Swallow whole.   clopidogrel  75 MG tablet Commonly known as: PLAVIX  Take 1 tablet (75 mg total) by mouth daily.   dapagliflozin  propanediol 10 MG Tabs tablet Commonly known as: Farxiga  Take 1 tablet (10 mg total) by mouth daily before breakfast.   denosumab  60 MG/ML Sosy injection Commonly known as: PROLIA  Inject 60 mg into the skin every 6 (six) months.   docusate sodium  100 MG capsule Commonly known as: Colace Take 1 capsule (100 mg total) by mouth 2 (two) times daily.   ferrous gluconate  324 MG tablet Commonly known as: FERGON Take 1 tablet (324 mg total) by mouth every other day.   furosemide  40 MG tablet Commonly known as:  Lasix  Take 40 mg Daily alternating with 60 mg Daily What changed:  how much to take how to take this when to take this   levothyroxine  75 MCG tablet Commonly known as: SYNTHROID  Take 75 mcg by mouth daily before breakfast.   metoprolol  succinate 50 MG 24 hr tablet Commonly known as: TOPROL -XL TAKE 1 AND 1/2 TABLETS BY MOUTH  TWICE DAILY   potassium chloride  SA 20 MEQ tablet Commonly known as: KLOR-CON  M Take 1 tablet (20 mEq total) by mouth daily.   psyllium 58.6 % packet Commonly known as:  METAMUCIL Take 1 packet by mouth 2 (two) times daily. What changed: when to take this   THERATEARS OP Place 1 drop into both eyes daily.        No Known Allergies  Consultations: Neurology   Procedures/Studies: MR BRAIN WO CONTRAST Result Date: 10/22/2023 CLINICAL DATA:  Neuro deficit, acute, stroke suspected EXAM: MRI HEAD WITHOUT CONTRAST TECHNIQUE: Multiplanar, multiecho pulse sequences of the brain and surrounding structures were obtained without intravenous contrast. COMPARISON:  CTA head/neck Oct 21, 2023. FINDINGS: Brain: Acute right MCA territory infarcts involving the right insula and overlying frontal and parietal lobes. Associated edema without substantial mass effect. No midline shift or acute hemorrhage. No mass lesion or hydrocephalus. Patchy T2/FLAIR hyperintensities the white matter compatible with chronic microvascular ischemic disease. Vascular: Major arterial flow voids are maintained skull base. Skull and upper cervical spine: Normal marrow signal. Sinuses/Orbits: Clear sinuses.  No acute orbital findings. Other: No mastoid effusions. IMPRESSION: Acute right MCA territory infarcts involving the right insula and overlying frontal and parietal lobes. No substantial mass effect. Electronically Signed   By: Stevenson Elbe M.D.   On: 10/22/2023 23:04   ECHOCARDIOGRAM LIMITED Result Date: 10/22/2023    ECHOCARDIOGRAM LIMITED REPORT   Patient Name:   Shelly Fields [facility] Date of Exam: 10/22/2023 Medical Rec #:  [id number]      Height:       62.0 in Accession #:    [id number]     Weight:       89.1 lb Date of Birth:  Nov 19, 1934     BSA:          1.356 m Patient Age:    89 years       BP:           148/66 mmHg Patient Gender: F              HR:           73 bpm. Exam Location:  [facility] Procedure: Limited Echo, Cardiac Doppler and Color Doppler (Both Spectral and            Color Flow Doppler were utilized during procedure). Indications:    Stroke  History:        Patient has prior  history of Echocardiogram examinations, most                 recent 09/17/2023. CHF, Abnormal ECG, Stroke, Arrythmias:Atrial                 Fibrillation, Signs/Symptoms:Syncope; Risk Factors:Diabetes.  Sonographer:    Raynelle Callow RDCS Referring Phys: [id number] OLADAPO ADEFESO IMPRESSIONS  1. There is mild left ventricular hypertrophy. Left ventricular diastolic parameters are indeterminate. There is the interventricular septum is flattened in systole and diastole, consistent with right ventricular pressure and volume overload.  2. Right ventricular systolic function is moderately reduced. The right ventricular size is severely enlarged. There is severely elevated  pulmonary artery systolic pressure. The estimated right ventricular systolic pressure is 42.2 mmHg.  3. Left atrial size was severely dilated.  4. Right atrial size was severely dilated.  5. The mitral valve is abnormal. Mild mitral valve regurgitation. No evidence of mitral stenosis.  6. The tricuspid valve is abnormal. Tricuspid valve regurgitation is severe.  7. The aortic valve is tricuspid. Aortic valve regurgitation is mild. No aortic stenosis is present.  8. The inferior vena cava is dilated in size with <50% respiratory variability, suggesting right atrial pressure of 15 mmHg.  9. Limited echo in setting of stroke FINDINGS  Left Ventricle: The left ventricular internal cavity size was normal in size. There is mild left ventricular hypertrophy. The interventricular septum is flattened in systole and diastole, consistent with right ventricular pressure and volume overload. Left ventricular diastolic parameters are indeterminate. Right Ventricle: The right ventricular size is severely enlarged. Right ventricular systolic function is moderately reduced. There is severely elevated pulmonary artery systolic pressure. The tricuspid regurgitant velocity is 2.61 m/s, and with an assumed right atrial pressure of 15 mmHg, the estimated right ventricular systolic  pressure is 42.2 mmHg. Left Atrium: Left atrial size was severely dilated. Right Atrium: Right atrial size was severely dilated. Mitral Valve: The mitral valve is abnormal. Mild mitral valve regurgitation. No evidence of mitral valve stenosis. MV peak gradient, 6.6 mmHg. The mean mitral valve gradient is 2.0 mmHg. Tricuspid Valve: The tricuspid valve is abnormal. Tricuspid valve regurgitation is severe. No evidence of tricuspid stenosis. Aortic Valve: The aortic valve is tricuspid. Aortic valve regurgitation is mild. No aortic stenosis is present. Aortic valve mean gradient measures 1.3 mmHg. Aortic valve peak gradient measures 1.8 mmHg. Aortic valve area, by VTI measures 3.02 cm. Aorta: The aortic root and ascending aorta are structurally normal, with no evidence of dilitation. Venous: The inferior vena cava is dilated in size with less than 50% respiratory variability, suggesting right atrial pressure of 15 mmHg. IAS/Shunts: No atrial level shunt detected by color flow Doppler. LEFT VENTRICLE PLAX 2D LVIDd:         3.60 cm LVIDs:         2.36 cm LV PW:         1.10 cm LV IVS:        1.08 cm LVOT diam:     2.15 cm LV SV:         54 LV SV Index:   40 LVOT Area:     3.63 cm  LV Volumes (MOD) LV vol d, MOD A2C: 62.1 ml LV vol d, MOD A4C: 52.6 ml LV vol s, MOD A2C: 24.8 ml LV vol s, MOD A4C: 15.8 ml LV SV MOD A2C:     37.4 ml LV SV MOD A4C:     52.6 ml LV SV MOD BP:      36.8 ml IVC IVC diam: 3.50 cm LEFT ATRIUM             Index        RIGHT ATRIUM           Index LA Vol (A2C):   94.6 ml 69.79 ml/m  RA Area:     40.60 cm LA Vol (A4C):   68.9 ml 50.83 ml/m  RA Volume:   168.00 ml 123.94 ml/m LA Biplane Vol: 79.3 ml 58.50 ml/m  AORTIC VALVE AV Area (Vmax):    4.24 cm AV Area (Vmean):   2.90 cm AV Area (VTI):     3.02  cm AV Vmax:           66.37 cm/s AV Vmean:          55.426 cm/s AV VTI:            0.179 m AV Peak Grad:      1.8 mmHg AV Mean Grad:      1.3 mmHg LVOT Vmax:         77.43 cm/s LVOT Vmean:         44.211 cm/s LVOT VTI:          0.149 m LVOT/AV VTI ratio: 0.83  AORTA Ao Root diam: 3.30 cm Ao Asc diam:  3.40 cm MITRAL VALVE               TRICUSPID VALVE MV Area (PHT): 3.61 cm    TR Peak grad:   27.2 mmHg MV Peak grad:  6.6 mmHg    TR Vmax:        261.00 cm/s MV Mean grad:  2.0 mmHg MV Vmax:       1.28 m/s    SHUNTS MV Vmean:      63.9 cm/s   Systemic VTI:  0.15 m MV Decel Time: 210 msec    Systemic Diam: 2.15 cm MV E velocity: 88.70 cm/s Armida Lander MD Electronically signed by Armida Lander MD Signature Date/Time: 10/22/2023/3:14:08 PM    Final    CT ANGIO HEAD NECK W WO CM Result Date: 10/21/2023 CLINICAL DATA:  Left-sided weakness EXAM: CT ANGIOGRAPHY HEAD AND NECK WITH AND WITHOUT CONTRAST TECHNIQUE: Multidetector CT imaging of the head and neck was performed using the standard protocol during bolus administration of intravenous contrast. Multiplanar CT image reconstructions and MIPs were obtained to evaluate the vascular anatomy. Carotid stenosis measurements (when applicable) are obtained utilizing NASCET criteria, using the distal internal carotid diameter as the denominator. RADIATION DOSE REDUCTION: This exam was performed according to the departmental dose-optimization program which includes automated exposure control, adjustment of the mA and/or kV according to patient size and/or use of iterative reconstruction technique. CONTRAST:  75mL OMNIPAQUE  IOHEXOL  350 MG/ML SOLN COMPARISON:  None Available. FINDINGS: CT HEAD FINDINGS Brain: There is no mass, hemorrhage or extra-axial collection. The size and configuration of the ventricles and extra-axial CSF spaces are normal. There is hypoattenuation of the white matter, most commonly indicating chronic small vessel disease. Vascular: No hyperdense vessel or unexpected vascular calcification. Skull: The visualized skull base, calvarium and extracranial soft tissues are normal. Sinuses/Orbits: No fluid levels or advanced mucosal thickening of the  visualized paranasal sinuses. No mastoid or middle ear effusion. Normal orbits. CTA NECK FINDINGS Skeleton: No acute abnormality or high grade bony spinal canal stenosis. Other neck: Normal pharynx, larynx and major salivary glands. No cervical lymphadenopathy. Unremarkable thyroid  gland. Upper chest: No pneumothorax or pleural effusion. No nodules or masses. Aortic arch: There is calcific atherosclerosis of the aortic arch. Conventional 3 vessel aortic branching pattern. RIGHT carotid system: No dissection, occlusion or aneurysm. Mild atherosclerotic calcification at the carotid bifurcation without hemodynamically significant stenosis. LEFT carotid system: No dissection, occlusion or aneurysm. Mild atherosclerotic calcification at the carotid bifurcation without hemodynamically significant stenosis. Vertebral arteries: Codominant configuration. Mild stenosis of the left vertebral artery origin due to atherosclerotic calcification. Otherwise, the vertebral arteries are normal to the skull base. CTA HEAD FINDINGS POSTERIOR CIRCULATION: Vertebral arteries are normal. No proximal occlusion of the anterior or inferior cerebellar arteries. Basilar artery is normal. Superior cerebellar arteries are normal. Posterior cerebral arteries  are normal. ANTERIOR CIRCULATION: Atherosclerotic calcification of the internal carotid arteries at the skull base without hemodynamically significant stenosis. Anterior cerebral arteries are normal. There is occlusion of a distal M2 branch of the right MCA (series 9, image 198). The middle cerebral arteries are otherwise normal. Venous sinuses: As permitted by contrast timing, patent. Anatomic variants: None Review of the MIP images confirms the above findings. IMPRESSION: 1. Occlusion of a distal M2 branch of the right MCA. 2. No hemodynamically significant stenosis of the carotid arteries. 3. Mild stenosis of the left vertebral artery origin due to atherosclerotic calcification. Aortic  Atherosclerosis (ICD10-I70.0). Critical Value/emergent results were called by telephone at the time of interpretation on 10/21/2023 at 9:23 pm to provider [facility] , who verbally acknowledged these results. Electronically Signed   By: Juanetta Nordmann M.D.   On: 10/21/2023 21:23     Discharge Exam: Vitals:   10/26/23 0414 10/26/23 0700  BP: 135/73 128/66  Pulse: 83 86  Resp: 19 20  Temp: 98.6 F (37 C) 98.1 F (36.7 C)  SpO2: 100% 98%   Vitals:   10/25/23 1713 10/25/23 2040 10/26/23 0414 10/26/23 0700  BP: (!) 128/92 123/73 135/73 128/66  Pulse: 72 (!) 108 83 86  Resp: 16 20 19 20   Temp: 98.6 F (37 C) 99.1 F (37.3 C) 98.6 F (37 C) 98.1 F (36.7 C)  TempSrc: Oral Oral Oral Oral  SpO2: 92% 100% 100% 98%  Weight:      Height:        General: Pt is alert, awake, not in acute distress Cardiovascular: RRR, S1/S2 +, no rubs, no gallops Respiratory: CTA bilaterally, no wheezing, no rhonchi Abdominal: Soft, NT, ND, bowel sounds + Extremities: no edema, no cyanosis    The results of significant diagnostics from this hospitalization (including imaging, microbiology, ancillary and laboratory) are listed below for reference.     Microbiology: No results found for this or any previous visit (from the past 240 hours).   Labs: BNP (last 3 results) Recent Labs    09/16/23 1645  BNP 636.0*   Basic Metabolic Panel: Recent Labs  Lab 10/21/23 1854 10/22/23 0443 10/23/23 0407  NA 132* 133* 131*  K 3.7 3.1* 3.6  CL 98 103 100  CO2 24 22 22   GLUCOSE 109* 92 83  BUN 18 16 12   CREATININE 0.75 0.86 0.61  CALCIUM 9.2 8.5* 8.5*  MG  --  1.9 2.0  PHOS  --  3.1  --    Liver Function Tests: Recent Labs  Lab 10/21/23 1854 10/22/23 0443  AST 25 17  ALT 16 15  ALKPHOS 104 87  BILITOT 0.9 0.8  PROT 7.5 6.3*  ALBUMIN  3.6 3.1*   No results for input(s): "LIPASE", "AMYLASE" in the last 168 hours. No results for input(s): "AMMONIA" in the last 168 hours. CBC: Recent  Labs  Lab 10/21/23 1854 10/22/23 0443 10/23/23 0407  WBC 7.0 7.7 8.4  NEUTROABS 5.5  --   --   HGB 11.6* 10.2* 11.0*  HCT 37.3 33.8* 36.1  MCV 89.4 88.3 88.0  PLT 241 210 218   Cardiac Enzymes: Recent Labs  Lab 10/21/23 1854 10/22/23 0443  CKTOTAL 69 50   BNP: Invalid input(s): "POCBNP" CBG: Recent Labs  Lab 10/25/23 1601 10/25/23 2036 10/25/23 2350 10/26/23 0413 10/26/23 0728  GLUCAP 102* 128* 107* 119* 93   D-Dimer No results for input(s): "DDIMER" in the last 72 hours. Hgb A1c No results for input(s): "HGBA1C" in the  last 72 hours. Lipid Profile No results for input(s): "CHOL", "HDL", "LDLCALC", "TRIG", "CHOLHDL", "LDLDIRECT" in the last 72 hours. Thyroid  function studies No results for input(s): "TSH", "T4TOTAL", "T3FREE", "THYROIDAB" in the last 72 hours.  Invalid input(s): "FREET3" Anemia work up No results for input(s): "VITAMINB12", "FOLATE", "FERRITIN", "TIBC", "IRON ", "RETICCTPCT" in the last 72 hours. Urinalysis    Component Value Date/Time   COLORURINE STRAW (A) 01/20/2022 1847   APPEARANCEUR Clear 02/09/2023 0909   LABSPEC 1.005 01/20/2022 1847   PHURINE 7.0 01/20/2022 1847   GLUCOSEU 3+ (A) 02/09/2023 0909   HGBUR NEGATIVE 01/20/2022 1847   BILIRUBINUR Negative 02/09/2023 0909   KETONESUR NEGATIVE 01/20/2022 1847   PROTEINUR Negative 02/09/2023 0909   PROTEINUR NEGATIVE 01/20/2022 1847   NITRITE Negative 02/09/2023 0909   NITRITE NEGATIVE 01/20/2022 1847   LEUKOCYTESUR Trace (A) 02/09/2023 0909   LEUKOCYTESUR NEGATIVE 01/20/2022 1847   Sepsis Labs Recent Labs  Lab 10/21/23 1854 10/22/23 0443 10/23/23 0407  WBC 7.0 7.7 8.4   Microbiology No results found for this or any previous visit (from the past 240 hours).   Time coordinating discharge: 35 minutes  SIGNED:   Cornelius Dill, DO [facility] 10/26/2023, 9:33 AM  If 7PM-7AM, please contact night-coverage [email or web address]

## 2023-10-26 NOTE — Discharge Summary (Signed)
 Physician Discharge Summary  Patient ID: Shelly Fields MRN: [id number] DOB/AGE: 09-29-1934 89 y.o.  Admit date: 10/26/2023 Discharge date: 11/02/2023  Discharge Diagnoses:  Principal Problem:   Right middle cerebral artery stroke [facility]) Atrial fibrillation Anxiety Type 2 diabetes mellitus Acute on chronic anemia with history of UGI Hypothyroidism Hypertension Constipation  Discharged Condition: Stable  Significant Diagnostic Studies: MR BRAIN WO CONTRAST Result Date: 10/22/2023 CLINICAL DATA:  Neuro deficit, acute, stroke suspected EXAM: MRI HEAD WITHOUT CONTRAST TECHNIQUE: Multiplanar, multiecho pulse sequences of the brain and surrounding structures were obtained without intravenous contrast. COMPARISON:  CTA head/neck Oct 21, 2023. FINDINGS: Brain: Acute right MCA territory infarcts involving the right insula and overlying frontal and parietal lobes. Associated edema without substantial mass effect. No midline shift or acute hemorrhage. No mass lesion or hydrocephalus. Patchy T2/FLAIR hyperintensities the white matter compatible with chronic microvascular ischemic disease. Vascular: Major arterial flow voids are maintained skull base. Skull and upper cervical spine: Normal marrow signal. Sinuses/Orbits: Clear sinuses.  No acute orbital findings. Other: No mastoid effusions. IMPRESSION: Acute right MCA territory infarcts involving the right insula and overlying frontal and parietal lobes. No substantial mass effect. Electronically Signed   By: Stevenson Elbe M.D.   On: 10/22/2023 23:04   ECHOCARDIOGRAM LIMITED Result Date: 10/22/2023    ECHOCARDIOGRAM LIMITED REPORT   Patient Name:   Shelly Fields [facility] Date of Exam: 10/22/2023 Medical Rec #:  [id number]      Height:       62.0 in Accession #:    [id number]     Weight:       89.1 lb Date of Birth:  [birth year]     BSA:          1.356 m Patient Age:    89 years       BP:           148/66 mmHg Patient Gender: F              HR:           73 bpm.  Exam Location:  [facility] Procedure: Limited Echo, Cardiac Doppler and Color Doppler (Both Spectral and            Color Flow Doppler were utilized during procedure). Indications:    Stroke  History:        Patient has prior history of Echocardiogram examinations, most                 recent 09/17/2023. CHF, Abnormal ECG, Stroke, Arrythmias:Atrial                 Fibrillation, Signs/Symptoms:Syncope; Risk Factors:Diabetes.  Sonographer:    Raynelle Callow RDCS Referring Phys: [id number] OLADAPO ADEFESO IMPRESSIONS  1. There is mild left ventricular hypertrophy. Left ventricular diastolic parameters are indeterminate. There is the interventricular septum is flattened in systole and diastole, consistent with right ventricular pressure and volume overload.  2. Right ventricular systolic function is moderately reduced. The right ventricular size is severely enlarged. There is severely elevated pulmonary artery systolic pressure. The estimated right ventricular systolic pressure is 42.2 mmHg.  3. Left atrial size was severely dilated.  4. Right atrial size was severely dilated.  5. The mitral valve is abnormal. Mild mitral valve regurgitation. No evidence of mitral stenosis.  6. The tricuspid valve is abnormal. Tricuspid valve regurgitation is severe.  7. The aortic valve is tricuspid. Aortic valve regurgitation is mild. No aortic stenosis is present.  8. The inferior vena  cava is dilated in size with <50% respiratory variability, suggesting right atrial pressure of 15 mmHg.  9. Limited echo in setting of stroke FINDINGS  Left Ventricle: The left ventricular internal cavity size was normal in size. There is mild left ventricular hypertrophy. The interventricular septum is flattened in systole and diastole, consistent with right ventricular pressure and volume overload. Left ventricular diastolic parameters are indeterminate. Right Ventricle: The right ventricular size is severely enlarged. Right ventricular systolic function is  moderately reduced. There is severely elevated pulmonary artery systolic pressure. The tricuspid regurgitant velocity is 2.61 m/s, and with an assumed right atrial pressure of 15 mmHg, the estimated right ventricular systolic pressure is 42.2 mmHg. Left Atrium: Left atrial size was severely dilated. Right Atrium: Right atrial size was severely dilated. Mitral Valve: The mitral valve is abnormal. Mild mitral valve regurgitation. No evidence of mitral valve stenosis. MV peak gradient, 6.6 mmHg. The mean mitral valve gradient is 2.0 mmHg. Tricuspid Valve: The tricuspid valve is abnormal. Tricuspid valve regurgitation is severe. No evidence of tricuspid stenosis. Aortic Valve: The aortic valve is tricuspid. Aortic valve regurgitation is mild. No aortic stenosis is present. Aortic valve mean gradient measures 1.3 mmHg. Aortic valve peak gradient measures 1.8 mmHg. Aortic valve area, by VTI measures 3.02 cm. Aorta: The aortic root and ascending aorta are structurally normal, with no evidence of dilitation. Venous: The inferior vena cava is dilated in size with less than 50% respiratory variability, suggesting right atrial pressure of 15 mmHg. IAS/Shunts: No atrial level shunt detected by color flow Doppler. LEFT VENTRICLE PLAX 2D LVIDd:         3.60 cm LVIDs:         2.36 cm LV PW:         1.10 cm LV IVS:        1.08 cm LVOT diam:     2.15 cm LV SV:         54 LV SV Index:   40 LVOT Area:     3.63 cm  LV Volumes (MOD) LV vol d, MOD A2C: 62.1 ml LV vol d, MOD A4C: 52.6 ml LV vol s, MOD A2C: 24.8 ml LV vol s, MOD A4C: 15.8 ml LV SV MOD A2C:     37.4 ml LV SV MOD A4C:     52.6 ml LV SV MOD BP:      36.8 ml IVC IVC diam: 3.50 cm LEFT ATRIUM             Index        RIGHT ATRIUM           Index LA Vol (A2C):   94.6 ml 69.79 ml/m  RA Area:     40.60 cm LA Vol (A4C):   68.9 ml 50.83 ml/m  RA Volume:   168.00 ml 123.94 ml/m LA Biplane Vol: 79.3 ml 58.50 ml/m  AORTIC VALVE AV Area (Vmax):    4.24 cm AV Area (Vmean):   2.90  cm AV Area (VTI):     3.02 cm AV Vmax:           66.37 cm/s AV Vmean:          55.426 cm/s AV VTI:            0.179 m AV Peak Grad:      1.8 mmHg AV Mean Grad:      1.3 mmHg LVOT Vmax:         77.43 cm/s LVOT Vmean:  44.211 cm/s LVOT VTI:          0.149 m LVOT/AV VTI ratio: 0.83  AORTA Ao Root diam: 3.30 cm Ao Asc diam:  3.40 cm MITRAL VALVE               TRICUSPID VALVE MV Area (PHT): 3.61 cm    TR Peak grad:   27.2 mmHg MV Peak grad:  6.6 mmHg    TR Vmax:        261.00 cm/s MV Mean grad:  2.0 mmHg MV Vmax:       1.28 m/s    SHUNTS MV Vmean:      63.9 cm/s   Systemic VTI:  0.15 m MV Decel Time: 210 msec    Systemic Diam: 2.15 cm MV E velocity: 88.70 cm/s Armida Lander MD Electronically signed by Armida Lander MD Signature Date/Time: 10/22/2023/3:14:08 PM    Final    CT ANGIO HEAD NECK W WO CM Result Date: 10/21/2023 CLINICAL DATA:  Left-sided weakness EXAM: CT ANGIOGRAPHY HEAD AND NECK WITH AND WITHOUT CONTRAST TECHNIQUE: Multidetector CT imaging of the head and neck was performed using the standard protocol during bolus administration of intravenous contrast. Multiplanar CT image reconstructions and MIPs were obtained to evaluate the vascular anatomy. Carotid stenosis measurements (when applicable) are obtained utilizing NASCET criteria, using the distal internal carotid diameter as the denominator. RADIATION DOSE REDUCTION: This exam was performed according to the departmental dose-optimization program which includes automated exposure control, adjustment of the mA and/or kV according to patient size and/or use of iterative reconstruction technique. CONTRAST:  75mL OMNIPAQUE  IOHEXOL  350 MG/ML SOLN COMPARISON:  None Available. FINDINGS: CT HEAD FINDINGS Brain: There is no mass, hemorrhage or extra-axial collection. The size and configuration of the ventricles and extra-axial CSF spaces are normal. There is hypoattenuation of the white matter, most commonly indicating chronic small vessel disease.  Vascular: No hyperdense vessel or unexpected vascular calcification. Skull: The visualized skull base, calvarium and extracranial soft tissues are normal. Sinuses/Orbits: No fluid levels or advanced mucosal thickening of the visualized paranasal sinuses. No mastoid or middle ear effusion. Normal orbits. CTA NECK FINDINGS Skeleton: No acute abnormality or high grade bony spinal canal stenosis. Other neck: Normal pharynx, larynx and major salivary glands. No cervical lymphadenopathy. Unremarkable thyroid  gland. Upper chest: No pneumothorax or pleural effusion. No nodules or masses. Aortic arch: There is calcific atherosclerosis of the aortic arch. Conventional 3 vessel aortic branching pattern. RIGHT carotid system: No dissection, occlusion or aneurysm. Mild atherosclerotic calcification at the carotid bifurcation without hemodynamically significant stenosis. LEFT carotid system: No dissection, occlusion or aneurysm. Mild atherosclerotic calcification at the carotid bifurcation without hemodynamically significant stenosis. Vertebral arteries: Codominant configuration. Mild stenosis of the left vertebral artery origin due to atherosclerotic calcification. Otherwise, the vertebral arteries are normal to the skull base. CTA HEAD FINDINGS POSTERIOR CIRCULATION: Vertebral arteries are normal. No proximal occlusion of the anterior or inferior cerebellar arteries. Basilar artery is normal. Superior cerebellar arteries are normal. Posterior cerebral arteries are normal. ANTERIOR CIRCULATION: Atherosclerotic calcification of the internal carotid arteries at the skull base without hemodynamically significant stenosis. Anterior cerebral arteries are normal. There is occlusion of a distal M2 branch of the right MCA (series 9, image 198). The middle cerebral arteries are otherwise normal. Venous sinuses: As permitted by contrast timing, patent. Anatomic variants: None Review of the MIP images confirms the above findings.  IMPRESSION: 1. Occlusion of a distal M2 branch of the right MCA. 2. No hemodynamically significant stenosis  of the carotid arteries. 3. Mild stenosis of the left vertebral artery origin due to atherosclerotic calcification. Aortic Atherosclerosis (ICD10-I70.0). Critical Value/emergent results were called by telephone at the time of interpretation on 10/21/2023 at 9:23 pm to provider [facility] , who verbally acknowledged these results. Electronically Signed   By: Juanetta Nordmann M.D.   On: 10/21/2023 21:23    Labs:  Basic Metabolic Panel: Recent Labs  Lab 10/23/23 0407 10/27/23 0507 10/28/23 0602  NA 131* 131* 129*  K 3.6 3.5 3.1*  CL 100 97* 95*  CO2 22 23 26   GLUCOSE 83 94 90  BUN 12 26* 21  CREATININE 0.61 0.78 0.79  CALCIUM 8.5* 8.7* 8.4*  MG 2.0  --   --     CBC: Recent Labs  Lab 10/23/23 0407 10/27/23 0507 10/28/23 0602  WBC 8.4 6.4 4.6  NEUTROABS  --  4.4  --   HGB 11.0* 8.8* 8.1*  HCT 36.1 28.6* 25.2*  MCV 88.0 88.0 86.6  PLT 218 254 276    CBG: Recent Labs  Lab 10/25/23 2350 10/26/23 0413 10/26/23 0728 10/26/23 1226 10/26/23 1656  GLUCAP 107* 119* 93 85 87   Family history.  Mother with heart disease father with hypertension.  Denies any colon cancer or esophageal cancer or rectal cancer  Brief HPI:   Shelly Fields is a 89 y.o. right-handed female with history significant for atrial fibrillation off her Xarelto  due to recent concern for blood loss anemia followed by cardiology services Dr. Teddie Favre on recent admission 09/22/2023 for GI bleed followed by Dr. Alita Irwin requiring transfusion of 3 units, hypothyroidism, type 2 diabetes mellitus, hypertension anxiety as well as severe hearing loss.  Per chart review lives alone.  Reportedly independent driving prior to admission with occasional use of a rolling walker.  Presented to [facility] 10/21/2023 after falling backwards into her nightstand.  When family found her noted to have left-sided weakness  and dragging her left leg.  MRI showed acute right MCA infarction involving the right insula and overlying frontal parietal lobes.  No substantial mass effect.  CT angiogram head and neck showed occlusion of distal M2 branch of the right MCA.  No hemodynamically significant stenosis of the carotid arteries.  Admission chemistries unremarkable except hemoglobin 11.6 sodium 132 hemoglobin A1c 5.2 total CK within normal limits.  Echocardiogram with ejection fraction of 70 to 75% and mild left ventricular hypertrophy.  Neurology follow-up maintained on low-dose aspirin  and Plavix  for CVA prophylaxis x 90 days then aspirin  alone.  Tolerating a regular consistency diet.  Therapy evaluations completed due to patient's decreased functional mobility and left-sided weakness was admitted for a comprehensive rehab program.   Hospital Course: Shelly Fields was admitted to rehab 10/26/2023 for inpatient therapies to consist of PT, ST and OT at least three hours five days a week. Past admission physiatrist, therapy team and rehab RN have worked together to provide customized collaborative inpatient rehab.  Pertaining to patient's right MCA territory infarction involving the right insula and overlying frontal parietal lobes occlusion of distal M2 branch of the right MCA.  Patient remained stable followed by neurology services.  She will continue low-dose aspirin  and Plavix  x 90 days then aspirin  alone.  Mood stabilization with Xanax  as needed she was using melatonin as needed for sleep and attending full therapies.  Atrial fibrillation followed by cardiology service Dr. Londa Rival.  Anticoagulation recently discontinued due to GI bleed.  Toprol -XL 50 mg daily.  Cardiac rate  controlled.  Type 2 diabetes mellitus hemoglobin A1c 5.2 sliding scale insulin  as directed.  Acute on chronic anemia history of recent GI bleed followed by Dr. Alita Irwin maintained on iron  supplement with persistent drop in hemoglobin from 11-8.8-8.1-7.4 and  gastroenterology services consulted and underwent capsule endoscopy 10/29/2023.    Prior endoscopy and colonoscopy showed gastritis, H. pylori, diverticulosis and hemorrhoids.  She would follow-up outpatient GI services.  No bleeding episodes.  Bouts of constipation resolved with laxative assistance.  History of hypothyroidism remain on hormone supplement.   Blood pressures were monitored on TID basis and remained controlled and monitored  Diabetes has been monitored with ac/hs CBG checks and SSI was use prn for tighter BS control.    Rehab course: During patient's stay in rehab weekly team conferences were held to monitor patient's progress, set goals and discuss barriers to discharge. At admission, patient required minimal assist 100 feet rolling walker moderate assist step pivot transfers  Physical exam.  Blood pressure 128/66 pulse 86 temperature 98.1 respirations 20 oxygen saturation is 98% room air Constitutional.  No acute distress/patient very hard of hearing HEENT. Head.  Normocephalic and atraumatic Eyes.  Pupils round and reactive to light no discharge without nystagmus Neck.  Supple nontender no JVD without thyromegaly Cardiac regular rate and rhythm without any extra sounds or murmur heard Abdomen.  Soft nontender positive bowel sounds without rebound Respiratory effort normal no respiratory distress without wheeze Musculoskeletal Comments.  Right upper extremity 5 to 5 -/5 throughout Left upper extremity 4+/5 throughout except FA 4/5 Right lower extremity 5/5 very strong Left lower extremity 5 -/5 throughout Skin.  Fingertips black and slightly worse left third digit.  Skin is very frail Neurologic.  Very hard of hearing provides name and age follows commands speech dysarthric but intelligible.  He/She  has had improvement in activity tolerance, balance, postural control as well as ability to compensate for deficits. He/She has had improvement in functional use RUE/LUE  and  RLE/LLE as well as improvement in awareness.  Patient requires contact-guard assist for mobility.  Ambulates 220 feet with rollator.  Contact-guard assist for stairs.  Set up for upper body dressing contact-guard for lower body dressing.  Full family teaching completed plan discharge to home       Disposition:  There are no questions and answers to display.         Diet: Diabetic diet  Special Instructions: No driving smoking or alcohol  Plan aspirin  81 mg daily and Plavix  75 mg daily x 90 days then aspirin  alone  Medications at discharge 1.  Tylenol  as needed 2.  Xanax  0.125 mg daily as needed 3.  Aspirin  81 mg p.o. daily 4.  Plavix  75 mg p.o. daily 5.  Colace 100 mg twice daily 6.  Fergon 324 mg every 48 hours 7.  Lasix  40 mg alternating with 60 mg daily 8.  Synthroid  75 mcg p.o. daily 9.  Melatonin 3 mg nightly as needed sleep 10.  Toprol -XL 75 mg p.o. daily 11.  Metamucil 1 packet twice daily 12.  Farxiga  10 mg daily before breakfast 13.  Prolia  60 mg into the skin every 6 months 14.  Potassium chloride  20 mEq daily 15.  Carboxymethylcellulose sodium 1 drop both eyes daily 16.  Protonix  40 mg daily 30-35 minutes were spent completing discharge summary and discharge planning  Discharge Instructions     Ambulatory referral to Neurology   Complete by: As directed    An appointment is requested in approximately:  4 weeks right MCA infarction   Ambulatory referral to Physical Medicine Rehab   Complete by: As directed    Moderate complexity follow-up 1 to 2 weeks right MCA infarction        Follow-up Information     Kirsteins, Cecilia Coe, MD Follow up.   Specialty: Physical Medicine and Rehabilitation Why: Office to call for appointment Contact information: [address] [place] [address] [phone]         Hargis Lias, MD Follow up.   Specialty: Gastroenterology Why: Call for appointment as needed Contact information: [address] [place] [address] [phone]         Gerard Knight, MD Follow up.   Specialty: Cardiology Why: Call for appointment Contact information: [address] [place] [address] [phone]                 Signed: Sterling Eisenmenger 10/29/2023, 5:13 AM

## 2023-10-27 DIAGNOSIS — K922 Gastrointestinal hemorrhage, unspecified: Secondary | ICD-10-CM

## 2023-10-27 DIAGNOSIS — I10 Essential (primary) hypertension: Secondary | ICD-10-CM

## 2023-10-27 DIAGNOSIS — D62 Acute posthemorrhagic anemia: Secondary | ICD-10-CM

## 2023-10-27 DIAGNOSIS — I63511 Cerebral infarction due to unspecified occlusion or stenosis of right middle cerebral artery: Secondary | ICD-10-CM | POA: Diagnosis not present

## 2023-10-27 LAB — COMPREHENSIVE METABOLIC PANEL WITH GFR
ALT: 12 U/L (ref 0–44)
AST: 17 U/L (ref 15–41)
Albumin: 2.8 g/dL — ABNORMAL LOW (ref 3.5–5.0)
Alkaline Phosphatase: 65 U/L (ref 38–126)
Anion gap: 11 (ref 5–15)
BUN: 26 mg/dL — ABNORMAL HIGH (ref 8–23)
CO2: 23 mmol/L (ref 22–32)
Calcium: 8.7 mg/dL — ABNORMAL LOW (ref 8.9–10.3)
Chloride: 97 mmol/L — ABNORMAL LOW (ref 98–111)
Creatinine, Ser: 0.78 mg/dL (ref 0.44–1.00)
GFR, Estimated: >60 mL/min (ref >60)
Glucose, Bld: 94 mg/dL (ref 70–99)
Potassium: 3.5 mmol/L (ref 3.5–5.1)
Sodium: 131 mmol/L — ABNORMAL LOW (ref 135–145)
Total Bilirubin: 0.7 mg/dL (ref 0.0–1.2)
Total Protein: 5.9 g/dL — ABNORMAL LOW (ref 6.5–8.1)

## 2023-10-27 LAB — CBC WITH DIFFERENTIAL/PLATELET
Abs Immature Granulocytes: 0.02 10*3/uL (ref 0.00–0.07)
Basophils Absolute: 0.1 10*3/uL (ref 0.0–0.1)
Basophils Relative: 2 %
Eosinophils Absolute: 0.5 10*3/uL (ref 0.0–0.5)
Eosinophils Relative: 7 %
HCT: 28.6 % — ABNORMAL LOW (ref 36.0–46.0)
Hemoglobin: 8.8 g/dL — ABNORMAL LOW (ref 12.0–15.0)
Immature Granulocytes: 0 %
Lymphocytes Relative: 10 %
Lymphs Abs: 0.6 10*3/uL — ABNORMAL LOW (ref 0.7–4.0)
MCH: 27.1 pg (ref 26.0–34.0)
MCHC: 30.8 g/dL (ref 30.0–36.0)
MCV: 88 fL (ref 80.0–100.0)
Monocytes Absolute: 0.7 10*3/uL (ref 0.1–1.0)
Monocytes Relative: 12 %
Neutro Abs: 4.4 10*3/uL (ref 1.7–7.7)
Neutrophils Relative %: 69 %
Platelets: 254 10*3/uL (ref 150–400)
RBC: 3.25 MIL/uL — ABNORMAL LOW (ref 3.87–5.11)
RDW: 15.9 % — ABNORMAL HIGH (ref 11.5–15.5)
WBC: 6.4 10*3/uL (ref 4.0–10.5)
nRBC: 0 % (ref 0.0–0.2)

## 2023-10-27 MED ORDER — SALINE SPRAY 0.65 % NA SOLN
1.0000 | Freq: Three times a day (TID) | NASAL | Status: DC
  Administered 2023-10-27 – 2023-11-04 (×23): 1 via NASAL
  Filled 2023-10-27: qty 44

## 2023-10-27 MED ORDER — PANTOPRAZOLE SODIUM 40 MG PO TBEC
40.0000 mg | DELAYED_RELEASE_TABLET | Freq: Every day | ORAL | Status: DC
  Administered 2023-10-27 – 2023-11-01 (×6): 40 mg via ORAL
  Filled 2023-10-27 (×6): qty 1

## 2023-10-27 NOTE — Progress Notes (Signed)
 Occupational Therapy Session Note  Patient Details  Name: Shelly Fields MRN: [id number] Date of Birth: 09-24-1934  Today's Date: 10/27/2023 OT Individual Time: 5621-3086 OT Individual Time Calculation (min): 62 min    Short Term Goals: Week 1:  OT Short Term Goal 1 (Week 1): LTG=STG overall mod I goals OT Short Term Goal 2 (Week 1): Pt will perform simple meal prep activity with AD with mod I  Skilled Therapeutic Interventions/Progress Updates:  communicated with OTR prior to session about goals and POC.   Pt greeted seated in recliner, pt agreeable to OT intervention.      Transfers/bed mobility/functional mobility: pt completed all functional ambulation with Rw and supervision.   Therapeutic activity:  Pt completed various therapeutic activities focused on LB strength for LB ADLS, dynamic standing balance and IADL participation.  -pt instructed to complete lateral steps off of 2 inch step with BUE support from RW to challenge single leg stance for LB ADLS. Pt completed task with CGA with no LOB.  -pt instructed to reach across midline with BUEs to transport weighted ball across midline to simulate IADL task of putting groceries away. Pt completed task with CGA with no LOB. Graded task up and had pt reach to floor to retrieve same weighted balls with pt completing task with CGA.  -pt able to stand at hi lo table to complete peg board task to further assess LUE coordination.pt completed task with supervision, no FMC deficits noted.   9 Hole Peg Test is used to measure finger dexterity in pts with various neurological diagnoses. - Instructions The pt was instructed to pick up the pegs one at a time, using their dominant hand first and put them into the holes in any order until the holes were all filled. The pt then removed the pegs one at a time and returned them to the container. Both hands were tested separately.  - Results The pt completed the test in LUE-1 min and 3 seconds,  RUE- 44 secs. Scores are based on the time taken to complete the activity. The timer started the moment the pt touched the first peg until the moment the last peg hit the container.   - Norms for healthy females ages 84-70+ 26-55 R 17.38 L 18.92 56-60 R 17.86 L 19.48 61-65 R 18.99 L 20.33 66-70 R 19.90 L 21.44 71+ R 22.49 L 24.11  IADLS: pt completed simulated IADL task with pt instructed to retrieve washcloths around gym from various surface heights with Rw to simulate IADLs at home. Pt completed task with supervision. Pt even able to stand at hi lo table to fold wash cloths with no UE support.                 Ended session with pt seated in recliner with all needs within reach and chair alarm activated.                   Therapy Documentation Precautions:  Precautions Precautions: Fall Restrictions Weight Bearing Restrictions Per Provider Order: No  Pain: No pain    Therapy/Group: Individual Therapy  Willadean Hark 10/27/2023, 12:19 PM

## 2023-10-27 NOTE — Plan of Care (Signed)
  Problem: RH Balance Goal: LTG Patient will maintain dynamic standing with ADLs (OT) Description: LTG:  Patient will maintain dynamic standing balance with assist during activities of daily living (OT)  Flowsheets (Taken 10/27/2023 1337) LTG: Pt will maintain dynamic standing balance during ADLs with: Independent with assistive device   Problem: Sit to Stand Goal: LTG:  Patient will perform sit to stand in prep for activites of daily living with assistance level (OT) Description: LTG:  Patient will perform sit to stand in prep for activites of daily living with assistance level (OT) Flowsheets (Taken 10/27/2023 1337) LTG: PT will perform sit to stand in prep for activites of daily living with assistance level: Independent with assistive device   Problem: RH Grooming Goal: LTG Patient will perform grooming w/assist,cues/equip (OT) Description: LTG: Patient will perform grooming with assist, with/without cues using equipment (OT) Flowsheets (Taken 10/27/2023 1337) LTG: Pt will perform grooming with assistance level of: Independent with assistive device    Problem: RH Bathing Goal: LTG Patient will bathe all body parts with assist levels (OT) Description: LTG: Patient will bathe all body parts with assist levels (OT) Flowsheets (Taken 10/27/2023 1337) LTG: Pt will perform bathing with assistance level/cueing: Independent with assistive device    Problem: RH Dressing Goal: LTG Patient will perform upper body dressing (OT) Description: LTG Patient will perform upper body dressing with assist, with/without cues (OT). Flowsheets (Taken 10/27/2023 1337) LTG: Pt will perform upper body dressing with assistance level of: Independent with assistive device Goal: LTG Patient will perform lower body dressing w/assist (OT) Description: LTG: Patient will perform lower body dressing with assist, with/without cues in positioning using equipment (OT) Flowsheets (Taken 10/27/2023 1337) LTG: Pt will perform  lower body dressing with assistance level of: Independent with assistive device   Problem: RH Toileting Goal: LTG Patient will perform toileting task (3/3 steps) with assistance level (OT) Description: LTG: Patient will perform toileting task (3/3 steps) with assistance level (OT)  Flowsheets (Taken 10/27/2023 1337) LTG: Pt will perform toileting task (3/3 steps) with assistance level: Independent with assistive device   Problem: RH Tub/Shower Transfers Goal: LTG Patient will perform tub/shower transfers w/assist (OT) Description: LTG: Patient will perform tub/shower transfers with assist, with/without cues using equipment (OT) Flowsheets (Taken 10/27/2023 1337) LTG: Pt will perform tub/shower stall transfers with assistance level of: Independent with assistive device   Problem: RH Toilet Transfers Goal: LTG Patient will perform toilet transfers w/assist (OT) Description: LTG: Patient will perform toilet transfers with assist, with/without cues using equipment (OT) Flowsheets (Taken 10/27/2023 1337) LTG: Pt will perform toilet transfers with assistance level of: Independent with assistive device   Problem: RH Simple Meal Prep Goal: LTG Patient will perform simple meal prep w/assist (OT) Description: LTG: Patient will perform simple meal prep with assistance, with/without cues (OT). Flowsheets (Taken 10/27/2023 1338) LTG: Pt will perform simple meal prep with assistance level of: Independent with assistive device

## 2023-10-27 NOTE — Plan of Care (Signed)
  Problem: RH SKIN INTEGRITY Goal: RH STG MAINTAIN SKIN INTEGRITY WITH ASSISTANCE Description: STG Maintain Skin Integrity With Min Assistance. Outcome: Progressing   Problem: RH SAFETY Goal: RH STG ADHERE TO SAFETY PRECAUTIONS W/ASSISTANCE/DEVICE Description: STG Adhere to Safety Precautions With Min Assistance/Device. Outcome: Progressing   Problem: RH KNOWLEDGE DEFICIT Goal: RH STG INCREASE KNOWLEDGE OF STROKE PROPHYLAXIS Description: Patient will demonstrate knowledge of medications to assist with stroke prevention with educational materials and handouts provided by staff. Outcome: Progressing

## 2023-10-27 NOTE — Progress Notes (Addendum)
 PROGRESS NOTE   Subjective/Complaints: Pt had a good night. Slept really well! Has a good appetite. Ready for therapy today. Mild epistaxis  ROS: Patient denies fever, rash, sore throat, blurred vision, dizziness, nausea, vomiting, diarrhea, cough, shortness of breath or chest pain, joint or back/neck pain, headache, or mood change.    Objective:   No results found. Recent Labs    10/27/23 0507  WBC 6.4  HGB 8.8*  HCT 28.6*  PLT 254   Recent Labs    10/27/23 0507  NA 131*  K 3.5  CL 97*  CO2 23  GLUCOSE 94  BUN 26*  CREATININE 0.78  CALCIUM 8.7*    Intake/Output Summary (Last 24 hours) at 10/27/2023 0745 Last data filed at 10/26/2023 1831 Gross per 24 hour  Intake 480 ml  Output --  Net 480 ml     Pressure Injury 10/26/23 Coccyx Mid Stage 1 -  Intact skin with non-blanchable redness of a localized area usually over a bony prominence. (Active)  10/26/23 1220  Location: Coccyx  Location Orientation: Mid  Staging: Stage 1 -  Intact skin with non-blanchable redness of a localized area usually over a bony prominence.  Wound Description (Comments):   Present on Admission: Yes    Physical Exam: Vital Signs Blood pressure (!) 121/57, pulse 85, temperature 98.7 F (37.1 C), temperature source Oral, resp. rate 18, height 5\' 2"  (1.575 m), weight 38.7 kg, SpO2 100%.  General: Alert and oriented x 3, No apparent distress HEENT: Head is normocephalic, atraumatic, PERRLA, EOMI, sclera anicteric, oral mucosa pink and moist, dentition intact, ext ear canals clear, extremely HOH. Dried blood on tissue. Neck: Supple without JVD or lymphadenopathy Heart: Reg rate and rhythm. No murmurs rubs or gallops Chest: CTA bilaterally without wheezes, rales, or rhonchi; no distress Abdomen: Soft, non-tender, non-distended, bowel sounds positive. Extremities: No clubbing, cyanosis, or edema. Pulses are 2+ Psych: Pt's affect is  appropriate. Pt is cooperative. Very pleasant! Skin: Clean and intact without signs of breakdown. A few bruises Neuro:  Alert and oriented to person, place month/year, why she's here. HOH. Mild left central VII. Speech sl dysarthric. MMT: RUE and RLE 4+ to 5/5 throughout. LUE 4 to 4+/5. LLE 4+ to 5-/5.Sensory exam normal for light touch and pain in all 4 limbs. No limb ataxia or cerebellar signs. No abnormal tone appreciated.   Musculoskeletal: Full ROM, No pain with AROM or PROM in the neck, trunk, or extremities. Posture appropriate    Assessment/Plan: 1. Functional deficits which require 3+ hours per day of interdisciplinary therapy in a comprehensive inpatient rehab setting. Physiatrist is providing close team supervision and 24 hour management of active medical problems listed below. Physiatrist and rehab team continue to assess barriers to discharge/monitor patient progress toward functional and medical goals  Care Tool:  Bathing              Bathing assist       Upper Body Dressing/Undressing Upper body dressing        Upper body assist      Lower Body Dressing/Undressing Lower body dressing            Lower body assist  Toileting Toileting    Toileting assist       Transfers Chair/bed transfer  Transfers assist           Locomotion Ambulation   Ambulation assist              Walk 10 feet activity   Assist           Walk 50 feet activity   Assist           Walk 150 feet activity   Assist           Walk 10 feet on uneven surface  activity   Assist           Wheelchair     Assist               Wheelchair 50 feet with 2 turns activity    Assist            Wheelchair 150 feet activity     Assist          Blood pressure (!) 121/57, pulse 85, temperature 98.7 F (37.1 C), temperature source Oral, resp. rate 18, height 5\' 2"  (1.575 m), weight 38.7 kg, SpO2 100%.  Medical  Problem List and Plan: 1. Functional deficits secondary to right MCA territory infarction involving the right insula and overlying frontal parietal lobes/occlusion of distal M2 branch of the right MCA             -patient may  shower             -ELOS/Goals:  10-14 days -Patient is beginning CIR therapies today including PT, OT, and SLP  2.  Antithrombotics: -DVT/anticoagulation:  Mechanical: Antiembolism stockings, thigh (TED hose) Bilateral lower extremities             -antiplatelet therapy: Aspirin  81 mg daily and Plavix  75 mg day x 90 days then aspirin  alone 3. Pain Management: Tylenol  as needed 4. Mood/Behavior/Sleep: Xanax  0.125-0.25 mg daily as needed, melatonin 3 mg nightly as needed             -antipsychotic agents: N/A 5. Neuropsych/cognition: This patient is capable of making decisions on her own behalf. 6. Skin/Wound Care: Routine skin checks 7. Fluids/Electrolytes/Nutrition: continue to encourage po intake  -we discussed hydration as BUN up to 26--she will try to drink more 8.  Atrial fibrillation.  Followed by cardiology service Dr. Londa Rival.  Anticoagulation recently discontinued due to GI bleed.  Toprol -XL 50 mg daily.   -Currently heart rate controlled 9.  Type 2 diabetes mellitus.  Hemoglobin A1c 5.2.  SSI 10.  Acute on chronic anemia.  Ferritin on 324 mg every 48 hours.    -5/21 HGB down to 8.8 from 11 4 days ago. No gross blood other than mild epistaxis   -mild epistaxis--saline spray 11.  Hypothyroidism.  Synthroid  12.  Hypertension.  Continue Toprol -XL 75 mg daily, Lasix  40 mg every 48 hours alternating with 60 mg every 48 hours  -5/20 bp well controlled 13.  Constipation.  Home regimen of Colace 100 mg twice daily, Metamucil 1 packet twice daily. 14.  UGI bleed:  Recent admission 09/22/2023 followed by Dr. Alita Irwin.  Prior endoscopy and colonoscopy showed gastritis, H. pylori, diverticulosis and hemorrhoids.  Patient follow-up outpatient GI services   -see  #10  -monitor closely   -start PPI   LOS: 1 days A FACE TO FACE EVALUATION WAS PERFORMED  Rawland Caddy 10/27/2023, 7:45 AM

## 2023-10-27 NOTE — Evaluation (Signed)
 Occupational Therapy Assessment and Plan  Patient Details  Name: Shelly Fields MRN: [id number] Date of Birth: Sep 15, 1934  OT Diagnosis: muscle weakness (generalized) Rehab Potential: Rehab Potential (ACUTE ONLY): Good ELOS: ~ 7- 10 days   Today's Date: 10/27/2023 OT Individual Time: 4098-1191 OT Individual Time Calculation (min): 60 min     Hospital Problem: Principal Problem:   Right middle cerebral artery stroke [facility])   Past Medical History:  Past Medical History:  Diagnosis Date   Cor pulmonale (chronic) (HCC)    Diverticulosis    Essential hypertension    Hypothyroidism    Internal hemorrhoids    Persistent atrial fibrillation (HCC)    Pulmonary hypertension (HCC)    Seasonal allergies    Shingles    Past Surgical History:  Past Surgical History:  Procedure Laterality Date   ABDOMINAL HYSTERECTOMY     BIOPSY  03/25/2023   Procedure: BIOPSY;  Surgeon: Hargis Lias, MD;  [facility];  Service: Endoscopy;;   CATARACT EXTRACTION     COLONOSCOPY N/A 01/12/2013   Procedure: COLONOSCOPY;  Surgeon: Ruby Corporal, MD;  [facility];  Service: Endoscopy;  Laterality: N/A;  1030   COLONOSCOPY N/A 11/18/2017   Procedure: COLONOSCOPY;  Surgeon: Ruby Corporal, MD;  [facility];  Service: Endoscopy;  Laterality: N/A;   COLONOSCOPY WITH PROPOFOL  N/A 03/25/2023   Procedure: COLONOSCOPY WITH PROPOFOL ;  Surgeon: Hargis Lias, MD;  [facility];  Service: Endoscopy;  Laterality: N/A;  11:00AM;ASA 3   ESOPHAGOGASTRODUODENOSCOPY (EGD) WITH PROPOFOL  N/A 03/25/2023   Procedure: ESOPHAGOGASTRODUODENOSCOPY (EGD) WITH PROPOFOL ;  Surgeon: Hargis Lias, MD;  [facility];  Service: Endoscopy;  Laterality: N/A;  11:00;ASA 3   LESION REMOVAL Right 09/13/2023   Procedure: excision of right lower extremity squamous cell carcinoma;  Surgeon: Marijo Shove, DO;  Location: [facility];  Service: General;  Laterality: Right;    POLYPECTOMY  11/18/2017   Procedure: POLYPECTOMY;  Surgeon: Ruby Corporal, MD;  [facility];  Service: Endoscopy;;  colon    Assessment & Plan Clinical Impression: Patient is a 89 y.o. year old female ight-handed female with history significant for atrial fibrillation off her Xarelto  due to recent concern for blood loss anemia followed by cardiology service Dr. Teddie Favre and recent admission 09/22/2023 for GI bleed followed by Dr. Alita Irwin requiring transfusion of 3 units, hypothyroidism, type 2 diabetes mellitus, hypertension and Anxiety as well as severe Hard of hearing. Per chart review patient lives alone. 1 level home one-step to entry. Reportedly independent driving prior to admission with an occasional rolling walker. [facility]ed to [facility] 10/21/2023 after backwards into her nightstand. When family found her she was noted to have left-sided weakness and was dragging her left leg. MRI showed acute right MCA territory infarction involving the right insula and overlying frontal parietal lobes. No substantial mass effect. CT angiogram head and neck showed occlusion of distal M2 branch of the right MCA. No hemodynamically significant stenosis of the carotid arteries. Admission chemistries unremarkable except hemoglobin 11.6, sodium 132, hemoglobin A1c 5.2, total CK within normal limits. Echocardiogram with ejection fraction 70 to 75% and mild left ventricular hypertrophy. Neurology follow-up patient was cleared to begin aspirin  81 mg daily and Plavix  75 mg daily for CVA prophylaxis x 90 days then aspirin  alone. Tolerating regular consistency diet. Therapy evaluations completed due to patient decreased functional mobility left-sided weakness was admitted for a comprehensive rehab program.  Patient transferred to CIR on 10/26/2023 .    Patient currently requires min with basic self-care skills and basic functional mobility secondary to muscle weakness, decreased cardiorespiratoy  endurance, decreased coordination, and decreased standing balance and decreased balance strategies.  Prior to hospitalization, patient could complete ADL with modified independent .  Patient will benefit from skilled intervention to decrease level of assist with basic self-care skills and increase independence with basic self-care skills prior to discharge home with care partner.  Anticipate patient will require intermittent supervision and follow up home health.  OT - End of Session Activity Tolerance: Tolerates 30+ min activity with multiple rests Endurance Deficit: Yes OT Assessment Rehab Potential (ACUTE ONLY): Good OT Patient demonstrates impairments in the following area(s): Balance;Cognition;Endurance;Motor OT Basic ADL's Functional Problem(s): Bathing;Dressing;Toileting OT Advanced ADL's Functional Problem(s): Simple Meal Preparation OT Transfers Functional Problem(s): Toilet;Tub/Shower OT Additional Impairment(s): None OT Plan OT Intensity: Minimum of 1-2 x/day, 45 to 90 minutes OT Frequency: 5 out of 7 days OT Duration/Estimated Length of Stay: ~ 7- 10 days OT Treatment/Interventions: Balance/vestibular training;Discharge planning;Pain management;Self Care/advanced ADL retraining;Therapeutic Activities;UE/LE Coordination activities;Disease mangement/prevention;Functional mobility training;Patient/family education;Skin care/wound managment;Therapeutic Exercise;Community reintegration;DME/adaptive equipment instruction;Neuromuscular re-education;Psychosocial support;UE/LE Strength taining/ROM OT Self Feeding Anticipated Outcome(s): n/a OT Basic Self-Care Anticipated Outcome(s): mod I OT Toileting Anticipated Outcome(s): mod I OT Bathroom Transfers Anticipated Outcome(s): mod I OT Recommendation Patient destination: Home Follow Up Recommendations: Home health OT Equipment Recommended: To be determined   OT Evaluation Precautions/Restrictions  Precautions Precautions:  Fall Restrictions Weight Bearing Restrictions Per Provider Order: No General   Vital Signs Therapy Vitals Temp: 98 F (36.7 C) Pulse Rate: 71 Resp: 18 BP: 127/61 Patient Position (if appropriate): Sitting Oxygen Therapy SpO2: 96 % O2 Device: Room Air Pain Pain Assessment Pain Scale: 0-10 Pain Score: 0-No pain Home Living/Prior Functioning Home Living Family/patient expects to be discharged to:: Private residence Living Arrangements: Alone Available Help at Discharge: Family, Available 24 hours/day Type of Home: House Home Access: Level entry Entrance Stairs-Number of Steps: 1 Entrance Stairs-Rails: None Home Layout: One level Bathroom Shower/Tub: Tub/shower unit  Lives With: Alone Vision Baseline Vision/History: 0 No visual deficits Ability to See in Adequate Light: 1 Impaired Patient Visual Report: No change from baseline Perception  Perception: Within Functional Limits Praxis Praxis: WFL Cognition Cognition Overall Cognitive Status: Within Functional Limits for tasks assessed Orientation Level: Person;Place Memory: Appears intact Attention: Sustained Focused Attention: Appears intact Sustained Attention: Appears intact Awareness: Appears intact Problem Solving: Appears intact Reasoning: Appears intact Safety/Judgment: Appears intact Brief Interview for Mental Status (BIMS) Repetition of Three Words (First Attempt): 3 Temporal Orientation: Year: Correct Temporal Orientation: Month: Accurate within 5 days Temporal Orientation: Day: Correct Recall: "Sock": Yes, after cueing ("something to wear") Recall: "Blue": Yes, no cue required Recall: "Bed": Yes, after cueing ("a piece of furniture") BIMS Summary Score: 13 Sensation Sensation Light Touch: Appears Intact Proprioception: Appears Intact Coordination Gross Motor Movements are Fluid and Coordinated: No Fine Motor Movements are Fluid and Coordinated: Yes Coordination and Movement Description: Pt with  decr left LEG coordination Motor  Motor Motor - Skilled Clinical Observations: general weakness  Trunk/Postural Assessment  Cervical Assessment Cervical Assessment: Within Functional Limits Thoracic Assessment Thoracic Assessment: Within Functional Limits Lumbar Assessment Lumbar Assessment:  (posterior pelvic tilt) Postural Control Postural Control:  (leans to the right in dynamic sitting)  Balance Balance Balance Assessed: Yes Static Sitting Balance Static Sitting - Level of Assistance: 6: Modified independent (Device/Increase time) Dynamic Sitting Balance Dynamic Sitting - Level of Assistance:  5: Stand by assistance Static Standing Balance Static Standing - Balance Support: During functional activity Static Standing - Level of Assistance: 5: Stand by assistance Dynamic Standing Balance Dynamic Standing - Balance Support: During functional activity Dynamic Standing - Level of Assistance: 4: Min assist Extremity/Trunk Assessment RUE Assessment RUE Assessment: Within Functional Limits LUE Assessment General Strength Comments: 4/5 able to perform opposition of all fingers and manipulate small items  Care Tool Care Tool Self Care Eating   Eating Assist Level: Set up assist    Oral Care    Oral Care Assist Level: Set up assist    Bathing   Body parts bathed by patient: Right arm;Left arm;Chest;Abdomen;Front perineal area;Buttocks;Right upper leg;Left upper leg;Right lower leg;Left lower leg;Face     Assist Level: Contact Guard/Touching assist    Upper Body Dressing(including orthotics)   What is the patient wearing?: Bra;Pull over shirt   Assist Level: Set up assist    Lower Body Dressing (excluding footwear)   What is the patient wearing?: Underwear/pull up;Pants Assist for lower body dressing: Contact Guard/Touching assist    Putting on/Taking off footwear   What is the patient wearing?: Socks;Shoes Assist for footwear: Set up assist       Care Tool  Toileting Toileting activity   Assist for toileting: Contact Guard/Touching assist     Care Tool Bed Mobility Roll left and right activity   Roll left and right assist level: Independent with assistive device    Sit to lying activity   Sit to lying assist level: Contact Guard/Touching assist    Lying to sitting on side of bed activity   Lying to sitting on side of bed assist level: the ability to move from lying on the back to sitting on the side of the bed with no back support.: Contact Guard/Touching assist     Care Tool Transfers Sit to stand transfer   Sit to stand assist level: Contact Guard/Touching assist    Chair/bed transfer   Chair/bed transfer assist level: Contact Guard/Touching assist     Toilet transfer   Assist Level: Contact Guard/Touching assist     Care Tool Cognition  Expression of Ideas and Wants Expression of Ideas and Wants: 4. Without difficulty (complex and basic) - expresses complex messages without difficulty and with speech that is clear and easy to understand  Understanding Verbal and Non-Verbal Content Understanding Verbal and Non-Verbal Content: 4. Understands (complex and basic) - clear comprehension without cues or repetitions   Memory/Recall Ability     Refer to Care Plan for Long Term Goals  SHORT TERM GOAL WEEK 1 OT Short Term Goal 1 (Week 1): LTG=STG overall mod I goals OT Short Term Goal 2 (Week 1): Pt will perform simple meal prep activity with AD with mod I  Recommendations for other services: None    Skilled Therapeutic Intervention OT eval initiated with OT purpose, role and goals discussed. Pt with extremely difficulty with hearing however pt performed all tasks asked. Pt receive din the bed and ambulated to the bathroom with RW with contact guard. Pt was able to perform all ADL tasks (like she would at home) with supervision to contact guard with dynamic standing balance. Pt did lean to the left in unsupported sitting with dynamic  sitting balance when dressing but didn't loose balance and was able to correct with a verbal cue. Pt able to perform oral care sitting at sink with setup for supplies. Pt left sitting up in the recliner.   ADL  ADL Eating: Independent Grooming: Supervision/safety Where Assessed-Grooming: Sitting at sink Upper Body Bathing: Supervision/safety Where Assessed-Upper Body Bathing: Shower Lower Body Bathing: Contact guard Where Assessed-Lower Body Bathing: Shower;Chair Upper Body Dressing: Setup Where Assessed-Upper Body Dressing: Chair Lower Body Dressing: Contact guard Where Assessed-Lower Body Dressing: Chair Toileting: Setup Toilet Transfer: Furniture conservator/restorer Method: Ambulating Tub/Shower Transfer: Minimal assistance Film/video editor: Minimal assistance Mobility  Transfers Sit to Stand: Contact Guard/Touching assist Stand to Sit: Contact Guard/Touching assist   Discharge Criteria: Patient will be discharged from OT if patient refuses treatment 3 consecutive times without medical reason, if treatment goals not met, if there is a change in medical status, if patient makes no progress towards goals or if patient is discharged from hospital.  The above assessment, treatment plan, treatment alternatives and goals were discussed and mutually agreed upon: by patient  Oran Bimler 10/27/2023, 1:41 PM

## 2023-10-27 NOTE — Progress Notes (Signed)
 Inpatient Rehabilitation  Patient information reviewed and entered into eRehab system by [facility]. Karen Kays., CCC/SLP, PPS Coordinator.  Information including medical coding, functional ability and quality indicators will be reviewed and updated through discharge.

## 2023-10-27 NOTE — Patient Care Conference (Signed)
 Inpatient RehabilitationTeam Conference and Plan of Care Update Date: 10/27/2023   Time: 1023 am    Patient Name: Shelly Fields      Medical Record Number: [id number]  Date of Birth: August 12, 1934 Sex: Female         Room/Bed: 4W22C/4W22C-01 Payor Info: Payor: [employer]vertising copy[employer] MEDICARE / Plan: [employer] MEDICARE / Product Type: *No Product type* /    Admit Date/Time:  10/26/2023 12:08 PM  Primary Diagnosis:  Right middle cerebral artery stroke [facility])  Hospital Problems: Principal Problem:   Right middle cerebral artery stroke [facility])    Expected Discharge Date: Expected Discharge Date:  (pending evals)  Team Members Present: Physician leading conference: Dr. Lylia Sand Social Worker Present: Other (comment) (Forrestine Ike, RN) Nurse Present: Jerene Monks, RN PT Present: Oma Bias, PT;Dominic Sandoval, PTA OT Present: Florina Husbands, OT SLP Present: Reggie Caper, SLP PPS Coordinator present : Jestine Moron, SLP     Current Status/Progress Goal Weekly Team Focus  Bowel/Bladder   Continent B/B  LBM 5/19   Will remain continent with B/B   Assist with toilet needs qshift/prn    Swallow/Nutrition/ Hydration               ADL's   overall supervision   overall mod ind   family/pt educ,  ADL training, balance    Mobility   Overall supervision   ModI  Stairs, pt/family education, ambulatory endurance, dynamic standing balance    Communication                Safety/Cognition/ Behavioral Observations               Pain   No verbalization of pain at this time   Will be free from pain   Assess pain qshift/prn    Skin   Skin is intact   will maintain skin intergrity with no breakdown  Assess skin qshift/prn and provide education on preventing breakdown      Discharge Planning:  Eval pending; PTA lived alone nextdoor to son [name]; 1 level, 1 ste no rails; family to provide 24/7 supervision    Team Discussion: Patient admitted post right MCA  infarction. Patient with atrial fibrillation/ Upper GI bleed: medications adjusted by MD. Patient with mild epistaxis.   Patient on target to meet rehab goals: Evals pending  *See Care Plan and progress notes for long and short-term goals.   Revisions to Treatment Plan:  N/a    Teaching Needs: Safety, medications, transfers, toileting, dietary modifications, etc.    Current Barriers to Discharge: Decreased caregiver support and Wound care  Possible Resolutions to Barriers: Family Education     Medical Summary Current Status: Right MCA CVA, Afib, DM2, anemia, HTN, constipation, UGI bleed  Barriers to Discharge: Cardiac Complications;Self-care education;Medical stability  Barriers to Discharge Comments: Right MCA CVA, Afib, DM2, anemia, HTN, constipation, UGI bleed Possible Resolutions to [employer] Focus: monitor BP, laxatives, PPI started, monitor CBG, monitor HR   Continued Need for Acute Rehabilitation Level of Care: The patient requires daily medical management by a physician with specialized training in physical medicine and rehabilitation for the following reasons: Direction of a multidisciplinary physical rehabilitation program to maximize functional independence : Yes Medical management of patient stability for increased activity during participation in an intensive rehabilitation regime.: Yes Analysis of laboratory values and/or radiology reports with any subsequent need for medication adjustment and/or medical intervention. : Yes   I attest that I was present, lead the team conference, and concur with  the assessment and plan of the team.   Tyliyah Mcmeekin Gayo 10/27/2023, 1023 am

## 2023-10-28 DIAGNOSIS — I63511 Cerebral infarction due to unspecified occlusion or stenosis of right middle cerebral artery: Secondary | ICD-10-CM | POA: Diagnosis not present

## 2023-10-28 DIAGNOSIS — Z7902 Long term (current) use of antithrombotics/antiplatelets: Secondary | ICD-10-CM

## 2023-10-28 DIAGNOSIS — D500 Iron deficiency anemia secondary to blood loss (chronic): Secondary | ICD-10-CM

## 2023-10-28 DIAGNOSIS — K922 Gastrointestinal hemorrhage, unspecified: Secondary | ICD-10-CM | POA: Diagnosis not present

## 2023-10-28 DIAGNOSIS — D62 Acute posthemorrhagic anemia: Secondary | ICD-10-CM | POA: Diagnosis not present

## 2023-10-28 DIAGNOSIS — I10 Essential (primary) hypertension: Secondary | ICD-10-CM | POA: Diagnosis not present

## 2023-10-28 LAB — CBC
HCT: 25.2 % — ABNORMAL LOW (ref 36.0–46.0)
Hemoglobin: 8.1 g/dL — ABNORMAL LOW (ref 12.0–15.0)
MCH: 27.8 pg (ref 26.0–34.0)
MCHC: 32.1 g/dL (ref 30.0–36.0)
MCV: 86.6 fL (ref 80.0–100.0)
Platelets: 276 10*3/uL (ref 150–400)
RBC: 2.91 MIL/uL — ABNORMAL LOW (ref 3.87–5.11)
RDW: 16.1 % — ABNORMAL HIGH (ref 11.5–15.5)
WBC: 4.6 10*3/uL (ref 4.0–10.5)
nRBC: 0 % (ref 0.0–0.2)

## 2023-10-28 LAB — BASIC METABOLIC PANEL WITH GFR
Anion gap: 8 (ref 5–15)
BUN: 21 mg/dL (ref 8–23)
CO2: 26 mmol/L (ref 22–32)
Calcium: 8.4 mg/dL — ABNORMAL LOW (ref 8.9–10.3)
Chloride: 95 mmol/L — ABNORMAL LOW (ref 98–111)
Creatinine, Ser: 0.79 mg/dL (ref 0.44–1.00)
GFR, Estimated: >60 mL/min (ref >60)
Glucose, Bld: 90 mg/dL (ref 70–99)
Potassium: 3.1 mmol/L — ABNORMAL LOW (ref 3.5–5.1)
Sodium: 129 mmol/L — ABNORMAL LOW (ref 135–145)

## 2023-10-28 MED ORDER — POLYETHYLENE GLYCOL 3350 17 G PO PACK
17.0000 g | PACK | Freq: Two times a day (BID) | ORAL | Status: AC
  Administered 2023-10-28 – 2023-10-29 (×2): 17 g via ORAL
  Filled 2023-10-28 (×2): qty 1

## 2023-10-28 MED ORDER — CHLORHEXIDINE GLUCONATE CLOTH 2 % EX PADS
6.0000 | MEDICATED_PAD | Freq: Once | CUTANEOUS | Status: AC
  Administered 2023-10-29: 6 via TOPICAL

## 2023-10-28 NOTE — Plan of Care (Signed)
  Problem: RH Balance Goal: LTG Patient will maintain dynamic standing balance (PT) Description: LTG:  Patient will maintain dynamic standing balance with assistance during mobility activities (PT) Flowsheets (Taken 10/28/2023 0656) LTG: Pt will maintain dynamic standing balance during mobility activities with:: Independent with assistive device    Problem: Sit to Stand Goal: LTG:  Patient will perform sit to stand with assistance level (PT) Description: LTG:  Patient will perform sit to stand with assistance level (PT) Flowsheets (Taken 10/28/2023 0656) LTG: PT will perform sit to stand in preparation for functional mobility with assistance level: Independent   Problem: RH Bed Mobility Goal: LTG Patient will perform bed mobility with assist (PT) Description: LTG: Patient will perform bed mobility with assistance, with/without cues (PT). Flowsheets (Taken 10/28/2023 0656) LTG: Pt will perform bed mobility with assistance level of: Independent with assistive device    Problem: RH Car Transfers Goal: LTG Patient will perform car transfers with assist (PT) Description: LTG: Patient will perform car transfers with assistance (PT). Flowsheets (Taken 10/28/2023 0656) LTG: Pt will perform car transfers with assist:: Supervision/Verbal cueing   Problem: RH Furniture Transfers Goal: LTG Patient will perform furniture transfers w/assist (OT/PT) Description: LTG: Patient will perform furniture transfers  with assistance (OT/PT). Flowsheets (Taken 10/28/2023 [phone]) LTG: Pt will perform furniture transfers with assist:: Independent with assistive device    Problem: RH Ambulation Goal: LTG Patient will ambulate in home environment (PT) Description: LTG: Patient will ambulate in home environment, # of feet with assistance (PT). Flowsheets (Taken 10/28/2023 [phone]) LTG: Pt will ambulate in home environ  assist needed:: Independent with assistive device LTG: Ambulation distance in home environment: up to 50  ft per bout using LRAD Goal: LTG Patient will ambulate in community environment (PT) Description: LTG: Patient will ambulate in community environment, # of feet with assistance (PT). Flowsheets (Taken 10/28/2023 [phone]) LTG: Pt will ambulate in community environ  assist needed:: Supervision/Verbal cueing LTG: Ambulation distance in community environment: more than 500 ft using LRAD   Problem: RH Stairs Goal: LTG Patient will ambulate up and down stairs w/assist (PT) Description: LTG: Patient will ambulate up and down # of stairs with assistance (PT) Flowsheets (Taken 10/28/2023 0656) LTG: Pt will ambulate up/down stairs assist needed:: Independent with assistive device LTG: Pt will  ambulate up and down number of stairs: at least 12 steps using handrail setup as per home environment

## 2023-10-28 NOTE — Plan of Care (Signed)
  Problem: Consults Goal: RH STROKE PATIENT EDUCATION Description: See Patient Education module for education specifics  Outcome: Progressing   Problem: RH SKIN INTEGRITY Goal: RH STG MAINTAIN SKIN INTEGRITY WITH ASSISTANCE Description: STG Maintain Skin Integrity With Min Assistance. Outcome: Progressing   Problem: RH SAFETY Goal: RH STG ADHERE TO SAFETY PRECAUTIONS W/ASSISTANCE/DEVICE Description: STG Adhere to Safety Precautions With Min Assistance/Device. Outcome: Progressing

## 2023-10-28 NOTE — Progress Notes (Deleted)
 Patient ID: Shelly Fields, female   DOB: Oct 08, 1934, 89 y.o.   MRN: [id number] Brief GI Note  We attempted to see patient twice today Chart reviewed No overt bleeding Noted drop in hemoglobin from 11.6-8.1 today, in setting of aspirin  and Plavix   Will await Hemoccult Will reassess in a.m.

## 2023-10-28 NOTE — Progress Notes (Signed)
 Inpatient Rehabilitation Care Coordinator Assessment and Plan Patient Details  Name: Shelly Fields MRN: [id number] Date of Birth: Apr 21, 1935  Today's Date: 10/28/2023  Hospital Problems: Principal Problem:   Right middle cerebral artery stroke [facility])  Past Medical History:  Past Medical History:  Diagnosis Date   Cor pulmonale (chronic) (HCC)    Diverticulosis    Essential hypertension    Hypothyroidism    Internal hemorrhoids    Persistent atrial fibrillation (HCC)    Pulmonary hypertension (HCC)    Seasonal allergies    Shingles    Past Surgical History:  Past Surgical History:  Procedure Laterality Date   ABDOMINAL HYSTERECTOMY     BIOPSY  03/25/2023   Procedure: BIOPSY;  Surgeon: Hargis Lias, MD;  [facility];  Service: Endoscopy;;   CATARACT EXTRACTION     COLONOSCOPY N/A 01/12/2013   Procedure: COLONOSCOPY;  Surgeon: Ruby Corporal, MD;  [facility];  Service: Endoscopy;  Laterality: N/A;  1030   COLONOSCOPY N/A 11/18/2017   Procedure: COLONOSCOPY;  Surgeon: Ruby Corporal, MD;  [facility];  Service: Endoscopy;  Laterality: N/A;   COLONOSCOPY WITH PROPOFOL  N/A 03/25/2023   Procedure: COLONOSCOPY WITH PROPOFOL ;  Surgeon: Hargis Lias, MD;  [facility];  Service: Endoscopy;  Laterality: N/A;  11:00AM;ASA 3   ESOPHAGOGASTRODUODENOSCOPY (EGD) WITH PROPOFOL  N/A 03/25/2023   Procedure: ESOPHAGOGASTRODUODENOSCOPY (EGD) WITH PROPOFOL ;  Surgeon: Hargis Lias, MD;  [facility];  Service: Endoscopy;  Laterality: N/A;  11:00;ASA 3   LESION REMOVAL Right 09/13/2023   Procedure: excision of right lower extremity squamous cell carcinoma;  Surgeon: Marijo Shove, DO;  Location: [facility];  Service: General;  Laterality: Right;   POLYPECTOMY  11/18/2017   Procedure: POLYPECTOMY;  Surgeon: Ruby Corporal, MD;  [facility];  Service: Endoscopy;;  colon   Social History:  reports that she has  never smoked. She has been exposed to tobacco smoke. She has never used smokeless tobacco. She reports that she does not drink alcohol and does not use drugs.  Family / Support Systems    Social History Preferred language: English Religion: [employer]     Abuse/Neglect Abuse/Neglect Assessment Can Be Completed: Yes Physical Abuse: Denies Verbal Abuse: Denies Sexual Abuse: Denies Exploitation of patient/patient's resources: Denies Self-Neglect: Denies  Patient response to: Social Isolation - How often do you feel lonely or isolated from those around you?: Never  Emotional Status  Patient in good spirits, emotionally stable  Patient / Family Perceptions, Expectations & Goals  Patient would like to be as close to baseline as possible. Wants to continue to live alone with sons coming in to check on her intermittently and assure safety with bathing, etc.  Community Resources  Follow up pending; has DME  Discharge Planning Living Arrangements: Alone Support Systems: Children Type of Residence: Private residence Does the patient have any problems obtaining your medications?: No  Clinical Impression Doing well overall post right MCA CVA and limited by [facility] and Hgb level.  Forrestine Ike B 10/28/2023, 9:03 AM

## 2023-10-28 NOTE — Evaluation (Addendum)
 Physical Therapy Assessment and Plan  Patient Details  Name: Shelly Fields MRN: [id number] Date of Birth: December 29, 1934  PT Diagnosis: Coordination disorder, Difficulty walking, Hemiparesis non-dominant, and Muscle weakness Rehab Potential: Excellent ELOS: 7 days   Today's Date: 10/27/2023 PT Individual Time: 8469-6295 PT Individual Time Calculation (min): 75 min  Hospital Problem: Principal Problem:   Right middle cerebral artery stroke [facility])   Past Medical History:  Past Medical History:  Diagnosis Date   Cor pulmonale (chronic) (HCC)    Diverticulosis    Essential hypertension    Hypothyroidism    Internal hemorrhoids    Persistent atrial fibrillation (HCC)    Pulmonary hypertension (HCC)    Seasonal allergies    Shingles    Past Surgical History:  Past Surgical History:  Procedure Laterality Date   ABDOMINAL HYSTERECTOMY     BIOPSY  03/25/2023   Procedure: BIOPSY;  Surgeon: Hargis Lias, MD;  [facility];  Service: Endoscopy;;   CATARACT EXTRACTION     COLONOSCOPY N/A 01/12/2013   Procedure: COLONOSCOPY;  Surgeon: Ruby Corporal, MD;  [facility];  Service: Endoscopy;  Laterality: N/A;  1030   COLONOSCOPY N/A 11/18/2017   Procedure: COLONOSCOPY;  Surgeon: Ruby Corporal, MD;  [facility];  Service: Endoscopy;  Laterality: N/A;   COLONOSCOPY WITH PROPOFOL  N/A 03/25/2023   Procedure: COLONOSCOPY WITH PROPOFOL ;  Surgeon: Hargis Lias, MD;  [facility];  Service: Endoscopy;  Laterality: N/A;  11:00AM;ASA 3   ESOPHAGOGASTRODUODENOSCOPY (EGD) WITH PROPOFOL  N/A 03/25/2023   Procedure: ESOPHAGOGASTRODUODENOSCOPY (EGD) WITH PROPOFOL ;  Surgeon: Hargis Lias, MD;  [facility];  Service: Endoscopy;  Laterality: N/A;  11:00;ASA 3   LESION REMOVAL Right 09/13/2023   Procedure: excision of right lower extremity squamous cell carcinoma;  Surgeon: Marijo Shove, DO;  Location: [facility];  Service: General;   Laterality: Right;   POLYPECTOMY  11/18/2017   Procedure: POLYPECTOMY;  Surgeon: Ruby Corporal, MD;  [facility];  Service: Endoscopy;;  colon    Assessment & Plan Clinical Impression: Patient is a 89 y.o. right-handed female with history significant for atrial fibrillation off her Xarelto  due to recent concern for blood loss anemia followed by cardiology service Dr. Teddie Favre and recent admission 09/22/2023 for GI bleed followed by Dr. Alita Irwin requiring transfusion of 3 units, hypothyroidism, type 2 diabetes mellitus, hypertension and Anxiety as well as severe Hard of hearing. Per chart review patient lives alone. 1 level home one-step to entry. Reportedly independent driving prior to admission with an occasional rolling walker. [facility]ed to [facility]/21/2023 after backwards into her nightstand. When family found her she was noted to have left-sided weakness and was dragging her left leg. MRI showed acute right MCA territory infarction involving the right insula and overlying frontal parietal lobes. No substantial mass effect. CT angiogram head and neck showed occlusion of distal M2 branch of the right MCA. No hemodynamically significant stenosis of the carotid arteries. Admission chemistries unremarkable except hemoglobin 11.6, sodium 132, hemoglobin A1c 5.2, total CK within normal limits. Echocardiogram with ejection fraction 70 to 75% and mild left ventricular hypertrophy. Neurology follow-up patient was cleared to begin aspirin  81 mg daily and Plavix  75 mg daily for CVA prophylaxis x 90 days then aspirin  alone. Tolerating regular consistency diet. Therapy evaluations completed due to patient decreased functional mobility left-sided weakness was admitted for a comprehensive rehab program.   Patient transferred to CIR on  10/26/2023 .   Patient currently requires CGA with mobility secondary to muscle weakness, decreased cardiorespiratoy endurance, unbalanced muscle activation  and decreased coordination, decreased attention to left, decreased awareness, and decreased standing balance, decreased balance strategies, and hemipareisis.  Prior to hospitalization, patient was modified independent  with mobility and lived with Alone in a House home.  Home access is 1Level entry.  Patient will benefit from skilled PT intervention to maximize safe functional mobility, minimize fall risk, and decrease caregiver burden for planned discharge home with intermittent assist.  Anticipate patient will benefit from follow up OP at discharge.  PT - End of Session Activity Tolerance: Tolerates 30+ min activity with multiple rests Endurance Deficit: Yes PT Assessment Rehab Potential (ACUTE/IP ONLY): Excellent PT Barriers to Discharge: Decreased caregiver support;Insurance for SNF coverage;Weight PT Barriers to Discharge Comments: underweight PT Patient demonstrates impairments in the following area(s): Balance;Edema;Motor;Nutrition PT Transfers Functional Problem(s): Bed Mobility;Bed to Chair;Car;Furniture PT Locomotion Functional Problem(s): Ambulation;Stairs PT Plan PT Intensity: Minimum of 1-2 x/day ,45 to 90 minutes PT Frequency: 5 out of 7 days PT Duration Estimated Length of Stay: 7 days PT Treatment/Interventions: Ambulation/gait training;Cognitive remediation/compensation;Balance/vestibular training;Neuromuscular re-education;Patient/family education;Stair training;Therapeutic Exercise;UE/LE Coordination activities;UE/LE Strength taining/ROM;Therapeutic Activities;Psychosocial support;Functional mobility training;DME/adaptive equipment instruction;Discharge planning PT Transfers Anticipated Outcome(s): Mod I PT Locomotion Anticipated Outcome(s): Mod I PT Recommendation Recommendations for Other Services: Therapeutic Recreation consult Therapeutic Recreation Interventions: Kitchen group;Pet therapy;Stress management Follow Up Recommendations: Outpatient PT Patient destination:  Home Equipment Recommended: To be determined   PT Evaluation Precautions/Restrictions Precautions Precautions: Fall Precaution/Restrictions Comments: mild L hemipareisis Restrictions Weight Bearing Restrictions Per Provider Order: No General   Vital SignsTherapy Vitals Temp: 97.9 F (36.6 C) Pulse Rate: 77 Resp: 18 BP: 117/65 Patient Position (if appropriate): Lying Oxygen Therapy SpO2: 100 % O2 Device: Room Air Pain Pain Assessment Pain Scale: 0-10 Pain Score: 0-No pain Pain Interference Pain Interference Pain Effect on Sleep: 0. Does not apply - I have not had any pain or hurting in the past 5 days Pain Interference with Therapy Activities: 0. Does not apply - I have not received rehabilitationtherapy in the past 5 days Pain Interference with Day-to-Day Activities: 1. Rarely or not at all Home Living/Prior Functioning Home Living Available Help at Discharge: Family;Available 24 hours/day Type of Home: House Home Access: Level entry Entrance Stairs-Number of Steps: 1 Entrance Stairs-Rails: None Home Layout: One level Bathroom Shower/Tub: Engineer, manufacturing systems: Standard Bathroom Accessibility: Yes Additional Comments: Family lives on same property as pt.  Lives With: Alone Prior Function Level of Independence: Independent with basic ADLs;Independent with homemaking with ambulation;Independent with gait;Independent with transfers;Requires assistive device for independence (uses rollator)  Able to Take Stairs?: Yes Driving: Yes Vision/Perception  Vision - History Ability to See in Adequate Light: 1 Impaired Perception Perception: Within Functional Limits Praxis Praxis: WFL  Cognition Overall Cognitive Status: Within Functional Limits for tasks assessed Arousal/Alertness: Awake/alert Orientation Level: Oriented X4 Focused Attention: Appears intact Sustained Attention: Appears intact Memory: Appears intact Awareness: Impaired Problem Solving:  Appears intact Safety/Judgment: Appears intact Sensation Sensation Light Touch: Appears Intact Hot/Cold: Appears Intact Coordination Gross Motor Movements are Fluid and Coordinated: No Fine Motor Movements are Fluid and Coordinated: Yes Coordination and Movement Description: Mild decrease in coordination/ strength in LLE Heel Shin Test: WFL with BLE - less ROM with LLE most likely d/t decreased strength Motor  Motor Motor: Other (comment) (hemipareisis) Motor - Skilled Clinical Observations: general weakness; LLE weakness >RLE   Trunk/Postural Assessment  Cervical Assessment Cervical Assessment: Within  Functional Limits Thoracic Assessment Thoracic Assessment: Within Functional Limits Lumbar Assessment Lumbar Assessment: Exceptions to [facility] (posterior tilt in standing) Postural Control Postural Control: Within Functional Limits  Balance Balance Balance Assessed: Yes Standardized Balance Assessment Standardized Balance Assessment:  (5xSTS = 20.7 sec) Static Sitting Balance Static Sitting - Balance Support: No upper extremity supported;Feet supported Static Sitting - Level of Assistance: 6: Modified independent (Device/Increase time) Dynamic Sitting Balance Dynamic Sitting - Balance Support: No upper extremity supported;Feet supported Dynamic Sitting - Level of Assistance: 5: Stand by assistance Static Standing Balance Static Standing - Balance Support: During functional activity;No upper extremity supported Static Standing - Level of Assistance: 5: Stand by assistance Dynamic Standing Balance Dynamic Standing - Balance Support: During functional activity;Left upper extremity supported Dynamic Standing - Level of Assistance: Other (comment) (CGA) Extremity Assessment      RLE Assessment RLE Assessment: Within Functional Limits General Strength Comments: grossly 4 to 4+/5 LLE Assessment LLE Assessment: Exceptions to [facility] General Strength Comments: grossly 4/5 in MMT but  4-/5 functionally  Care Tool Care Tool Bed Mobility Roll left and right activity   Roll left and right assist level: Independent with assistive device    Sit to lying activity   Sit to lying assist level: Supervision/Verbal cueing    Lying to sitting on side of bed activity   Lying to sitting on side of bed assist level: the ability to move from lying on the back to sitting on the side of the bed with no back support.: Contact Guard/Touching assist     Care Tool Transfers Sit to stand transfer   Sit to stand assist level: Contact Guard/Touching assist    Chair/bed transfer   Chair/bed transfer assist level: Contact Guard/Touching assist    Car transfer   Car transfer assist level: Contact Guard/Touching assist      Care Tool Locomotion Ambulation   Assist level: Contact Guard/Touching assist Assistive device: Rollator Max distance: 220 ft  Walk 10 feet activity   Assist level: Supervision/Verbal cueing Assistive device: Rollator   Walk 50 feet with 2 turns activity   Assist level: Contact Guard/Touching assist Assistive device: Rollator  Walk 150 feet activity   Assist level: Contact Guard/Touching assist Assistive device: Rollator  Walk 10 feet on uneven surfaces activity   Assist level: Contact Guard/Touching assist Assistive device: Rollator  Stairs   Assist level: Contact Guard/Touching assist Stairs assistive device: 2 hand rails Max number of stairs: 12  Walk up/down 1 step activity   Walk up/down 1 step (curb) assist level: Supervision/Verbal cueing Walk up/down 1 step or curb assistive device: 2 hand rails  Walk up/down 4 steps activity   Walk up/down 4 steps assist level: Contact Guard/Touching assist Walk up/down 4 steps assistive device: 2 hand rails  Walk up/down 12 steps activity   Walk up/down 12 steps assist level: Contact Guard/Touching assist Walk up/down 12 steps assistive device: 2 hand rails  Pick up small objects from floor   Pick up small  object from the floor assist level: Supervision/Verbal cueing;Contact Guard/Touching assist Pick up small object from the floor assistive device: rollator  Wheelchair Is the patient using a wheelchair?: No   Wheelchair activity did not occur: N/A      Wheel 50 feet with 2 turns activity Wheelchair 50 feet with 2 turns activity did not occur: N/A    Wheel 150 feet activity Wheelchair 150 feet activity did not occur: N/A      Refer to Care Plan for Long  Term Goals  SHORT TERM GOAL WEEK 1 PT Short Term Goal 1 (Week 1): STG = LTG d/t ELOS  Recommendations for other services: Therapeutic Recreation  Pet therapy, Kitchen group, and Stress management  Skilled Therapeutic Intervention Mobility Bed Mobility Bed Mobility: Supine to Sit;Sit to Supine Supine to Sit: Contact Guard/Touching assist Sit to Supine: Supervision/Verbal cueing Transfers Transfers: Sit to Stand;Stand to Sit;Stand Pivot Transfers Sit to Stand: Contact Guard/Touching assist Stand to Sit: Contact Guard/Touching assist Stand Pivot Transfers: Contact Guard/Touching assist Stand Pivot Transfer Details (indicate cue type and reason): used rollator in/ out of home Transfer (Assistive device): Rollator Locomotion  Gait Ambulation: Yes Gait Assistance: Contact Guard/Touching assist;Supervision/Verbal cueing Gait Distance (Feet): 200 Feet Assistive device: Rollator Gait Gait: Yes Gait Pattern: Impaired Gait Pattern: Step-through pattern;Left flexed knee in stance Gait velocity: decreased Stairs / Additional Locomotion Stairs: Yes Stairs Assistance: Contact Guard/Touching assist Stair Management Technique: Two rails;Alternating pattern;Step to pattern;Forwards Number of Stairs: 12 Height of Stairs: 4 Wheelchair Mobility Wheelchair Mobility: No  Skilled Intervention: PT Evaluation completed; see above for results. PT educated patient in roles of PT vs OT, PT POC, rehab potential, rehab goals, and discharge  recommendations along with recommendation for follow-up rehabilitation services. Individual treatment initiated:  Patient seated upright in recliner upon PT arrival. Patient alert and agreeable to PT session. Has completed lunch.  No pain complaint at start of session.  Therapeutic Activity: Bed Mobility: Patient performed supine <> sit with supervision to reach supine and then light CGA to return to upright seated position on EOB. No cueing provided. Transfers: Patient performed sit <> stand transfers throughout session improving from CGA to supervision with increased mobility. Provided with rollator as pt relates using at home. Requires education and continued vc/ visual cue throughout session for application of brakes prior to all transfers.   Simulated car transfer using mat table and boxes with pt performing at supervision/ CGA level.   Gait Training:  Patient ambulated 285' x1/ 115' x1/ 200' x1 ft using rollator with CGA/ supervision. Demonstrated slightly flexed posture with L knee holding in flexed position during stance phase. Pt unaware until pointed out to her. Provided vc/tc for increased extension and able to correct minimally for brief periods.   Stair navigation assessment initiated with pt using BHR thoughout. Reciprocal step progression to ascend and step-to to descend leading with LLE. Relates that she does sometimes need to enter basement and is always "very careful".   Patient seated upright in recliner  at end of session with brakes locked, belt alarm set, and all needs within reach.  Discharge Criteria: Patient will be discharged from PT if patient refuses treatment 3 consecutive times without medical reason, if treatment goals not met, if there is a change in medical status, if patient makes no progress towards goals or if patient is discharged from hospital.  The above assessment, treatment plan, treatment alternatives and goals were discussed and mutually agreed upon: by  patient  Donne Gage PT, DPT, CSRS 10/27/2023, 6:40 PM

## 2023-10-28 NOTE — Progress Notes (Signed)
 Occupational Therapy Session Note  Patient Details  Name: Shelly Fields MRN: [id number] Date of Birth: 07-31-1934  Today's Date: 10/28/2023 OT Individual Time: 4098-1191 OT Individual Time Calculation (min): 48 min    Short Term Goals: Week 1:  OT Short Term Goal 1 (Week 1): LTG=STG overall mod I goals OT Short Term Goal 2 (Week 1): Pt will perform simple meal prep activity with AD with mod I  Skilled Therapeutic Interventions/Progress Updates:  Pt greeted seated in recliner with pts family present, pt agreeable to OT intervention.      Transfers/bed mobility/functional mobility: pt completed all functional ambulation greater than a household distance with rollator MODI.   Therapeutic activity: pt completed seated FMC task with pt instructed to use LUE to place push pins in cork board with a focus on pincer grasp, L sided attention and improved LUE isometric strength. Pt needed + time to complete task but able to complete task MODI.   family education provided to pts son ( joe) and granddaughter on the below topics: -recommendation on use of Rollator for functional ambulation - general assist needed for ADLS and functional mobility ( I.e supervision- MODI; supervision for showering and higher level IADLS) -general education provided on L sided weakness and impaired motor planning. -educated on decreasing clutter/fall hazards in the home I.e rugs - discussed DME needs -education provided on shower transfers to TTB even though pt demonstrated ability to step over tub threshold. Family to purchase TTB, recommended grab bars be installed in shower. -pt able to demo functional ambulation to flat HOB, recliner and couch with rollator MODI.   Family demonstrated appropriate level of assist with ADLs and functional mobility                  Ended session with pt seated EOB with all needs within reach and family present.   Therapy Documentation Precautions:  Precautions Precautions:  Fall Precaution/Restrictions Comments: mild L hemipareisis Restrictions Weight Bearing Restrictions Per Provider Order: No  Pain: No pain    Therapy/Group: Individual Therapy  Willadean Hark 10/28/2023, 3:16 PM

## 2023-10-28 NOTE — Progress Notes (Signed)
 Patient ID: Shelly Fields, female   DOB: 10-21-1934, 89 y.o.   MRN: [id number] Follow up with son [name] via telephone regarding team conference update from 10/27/23. Md reported epitaxis addressed; added PPI medication , A-fib controlled.  Currently able to ambulate up ot 100' using a RW and could be ready for discharge home tomorrow IF goals were for supervision. However, patient's goal is to be as independent as possible and team feels that she could progress to a mod I level overall and be at her home with family providing intermittent supervision by Tuesday, 11/02/23.  Son and patient state an understanding of the information provided and are in agreement with the scheduled discharge plan. Family education set for up 11/01/23 0800-1000 with son [name]. [name]

## 2023-10-28 NOTE — Progress Notes (Signed)
 Introduced patient to care coordinator team and conference schedule. Showed patient binder with educational materials. She looked through and was able to verbalize signs/symptoms of a stroke and reported that she would seek emergency care if she experienced any. Verbalized that she knows her afib puts her at risk of additional stroke. Discussed redness to her coccyx area and that she should turn regularly in the bed to prevent. Patient verbalized that her granddaughter would help her with her stroke prophylactic medications and that she does not struggle with compliance.

## 2023-10-28 NOTE — Progress Notes (Signed)
 Occupational Therapy Session Note  Patient Details  Name: Shelly Fields MRN: [id number] Date of Birth: 1934/10/31  Today's Date: 10/28/2023 OT Individual Time: 2130-8657 OT Individual Time Calculation (min): 45 min    Short Term Goals: Week 1:  OT Short Term Goal 1 (Week 1): LTG=STG overall mod I goals OT Short Term Goal 2 (Week 1): Pt will perform simple meal prep activity with AD with mod I  Skilled Therapeutic Interventions/Progress Updates:    Pt received in bed dressed and ready for the day. She declined taking a shower today because she had just showered yesterday. Pt did use her rollator to ambulate in room to complete oral care in standing,  used the bathroom, ambulated to tub room, practiced stepping over tub wall with grab bar, ambulated in kitchen to reach for cans in upper cabinets all with supervision.  Discussed how she does her meal prep at home.  Pt does not use her regular oven and instead uses the microwave and air fryer.  She occasionally used the range.   Pt ambulated back to her room and opted to sit in recliner.  Belt alarm on and all needs met. Pt is very HOH even with hearing aids, so I had to write a few things down for her to understand.   Therapy Documentation Precautions:  Precautions Precautions: Fall Precaution/Restrictions Comments: mild L hemipareisis Restrictions Weight Bearing Restrictions Per Provider Order: No    Pain: Pain Assessment Pain Scale: 0-10 Pain Score: 0-No pain ADL: ADL Eating: Independent Grooming: Supervision/safety Where Assessed-Grooming: Sitting at sink Upper Body Bathing: Supervision/safety Where Assessed-Upper Body Bathing: Shower Lower Body Bathing: Contact guard Where Assessed-Lower Body Bathing: Shower, Chair Upper Body Dressing: Setup Where Assessed-Upper Body Dressing: Chair Lower Body Dressing: Contact guard Where Assessed-Lower Body Dressing: Chair Toileting: Setup Toilet Transfer: Advice worker Method: Ambulating Tub/Shower Transfer: Minimal assistance Film/video editor: Minimal assistance Vision Baseline Vision/History: 0 No visual deficits Patient Visual Report: No change from baseline Vision Assessment?: No apparent visual deficits Perception  Perception: Within Functional Limits Praxis Praxis: WFL Balance Balance Balance Assessed: Yes Standardized Balance Assessment Standardized Balance Assessment:  (5xSTS = 20.7 sec) Static Sitting Balance Static Sitting - Balance Support: No upper extremity supported;Feet supported Static Sitting - Level of Assistance: 6: Modified independent (Device/Increase time) Dynamic Sitting Balance Dynamic Sitting - Balance Support: No upper extremity supported;Feet supported Dynamic Sitting - Level of Assistance: 5: Stand by assistance Static Standing Balance Static Standing - Balance Support: During functional activity;No upper extremity supported Static Standing - Level of Assistance: 5: Stand by assistance Dynamic Standing Balance Dynamic Standing - Balance Support: During functional activity;Left upper extremity supported Dynamic Standing - Level of Assistance: Other (comment) (CGA)     Therapy/Group: Individual Therapy  Ching Rabideau 10/28/2023, 8:45 AM

## 2023-10-28 NOTE — Progress Notes (Signed)
 Physical Therapy Session Note  Patient Details  Name: Shelly Fields MRN: [id number] Date of Birth: 1934/12/30  Today's Date: 10/28/2023 PT Individual Time: 1020-1125; 1433 - 1526 PT Individual Time Calculation (min): 65 min; 53 min    Short Term Goals: Week 1:  PT Short Term Goal 1 (Week 1): STG = LTG d/t ELOS  SESSION 1 Skilled Therapeutic Interventions/Progress Updates: Patient sitting in recliner on entrance to room. Patient alert and agreeable to PT session.   Patient reported no complaints of pain during session.  Therapeutic Activity: Transfers: Pt performed sit<>stand transfers throughout session with rollator and supervision for safety. Provided VC for locking sequence of brakes.  - Pt ambulated throughout inpatient floor in order to improve ambulatory tolerance to safely navigate household environment (from room<main gym<ADL apartment< room) using rollator with supervision/CGA for safety.  - Pt navigated 4" curb step with R UE supported on PTA side to act as "beam" that pt uses at home. Pt cued to ascend/descend curb as close as possible to beam, and to step close up to curb prior to advancing. Pt performed x 2 with CGA for safety.  - Pt participated in household chores such as grabbing dishes from cabinet (bowl, cup and measuring cup), and instructed to wash in sink. Pt bent forward to pick up dish detergent from underneath sink (acting cup) and used hand soap dispenser to place soap in sink (pt reported that she keeps dish detergent in cabinet under sink - did so with supervision and only cued to watch head on counter top). Pt filled up sink as pt would do at home with soap, and proceeded to wash dishes and place on towel to dry. Pt then dried dishes with towel. Pt performed task overall with supervision and no LOB.  Stair Navigation: Pt ascended/descended 12 (6") steps with BHR and close supervision for safety. Pt with reciprocal step pattern on 8 steps ascending, and then  step to on last 4 steps to improve safety. Pt descended with step to pattern.  Patient sitting in recliner at end of session with brakes locked, belt alarm set, and all needs within reach.  SESSION 2 Skilled Therapeutic Interventions/Progress Updates: Patient sitting EOB with family present on entrance to room. Patient alert and agreeable to PT session.   Patient reported no complaints of pain. Pop up family education initiated this session with family stating that pt will likely want to walk outside on non-compliant surfaces (grass) and want to play with great grand kids. Pt family stated pt will not be going up stairs to storage on 2nd floor, but might go down 3-4 steps to reach freezer or garden hose (also stated pt can be assisted with doing so if pt requires supervision at d/c). Future sessions will include pt participating in floor transfers, ambulating non-compliant surfaces outside, floor transfers, balance and ambulatory endurance to increase pt's functional independence to decrease caregiver burden.   Therapeutic Activity: Transfers: Pt performed sit<>stand transfers throughout session with supervision. Provided VC for maintaining safe proximity to RW.  - Pt ambulated throughout session with RW and rollator (150'+) with supervision (mild gait deviations noted but no LOB).  - Pt participated in 4" curb navigation with rollator at first with close supervision, but will need to work on safely managing stepping off of curb with brakes locked. RW initiated following with supervision and increase in safety. Pt with VC to step close to step and front of RW accordingly to avoid increase in forward flexion with pt  adhering to cue on last round. RW curb navigation encouraged as of right now due to safety (pt has both rollator and RW at home), but will continue to work with both. - Car transfer with supervision and no issue to simulated height of car that pt will d/c in with family.   Patient sitting  EOB at end of session with brakes locked, family present, son cleared for transfers in room, and all needs within reach.    Therapy Documentation Precautions:  Precautions Precautions: Fall Precaution/Restrictions Comments: mild L hemipareisis Restrictions Weight Bearing Restrictions Per Provider Order: No  Therapy/Group: Individual Therapy  Pierce Biagini PTA 10/28/2023, 1:18 PM

## 2023-10-28 NOTE — Progress Notes (Signed)
 [facility] Individual Statement of Services  Patient Name:  Shelly Fields  Date:  10/28/2023  Welcome to the [facility].  Our goal is to provide you with an individualized program based on your diagnosis and situation, designed to meet your specific needs.  With this comprehensive rehabilitation program, you will be expected to participate in at least 3 hours of rehabilitation therapies Monday-Friday, with modified therapy programming on the weekends.  Your rehabilitation program will include the following services:  Physical Therapy (PT), Occupational Therapy (OT), Speech Therapy (ST), 24 hour per day rehabilitation nursing, Neuropsychology, Care Coordinator, Rehabilitation Medicine, Nutrition Services, and Pharmacy Services  Weekly team conferences will be held on Wednesdays to discuss your progress.  Your Inpatient Rehabilitation Care Coordinator will talk with you frequently to get your input and to update you on team discussions.  Team conferences with you and your family in attendance may also be held.  Expected length of stay: 7 days  Overall anticipated outcome: Mod I overall  Depending on your progress and recovery, your program may change. Your Inpatient Rehabilitation Care Coordinator will coordinate services and will keep you informed of any changes. Your Inpatient Rehabilitation Care Coordinator's name and contact numbers are listed  below.  The following services may also be recommended but are not provided by the Inpatient [facility]:   Home Health Rehabiltiation Services Outpatient Rehabilitation Services   Arrangements will be made to provide these services after discharge if needed.  Arrangements include referral to agencies that provide these services.  Your insurance has been verified to be:  [facility] Your primary doctor is:  Alvia Awkward, MD  Pertinent information will be shared with your doctor and your insurance  company.  Inpatient Rehabilitation Care Coordinator:  Kathey Pang [phone] or [phone]  Information discussed with and copy given to patient by: [name], 10/28/2023, 9:07 AM

## 2023-10-28 NOTE — Progress Notes (Addendum)
 PROGRESS NOTE   Subjective/Complaints: Pt feeling well. Complimentary of her therapy team. No problems yesterday. No nose bleeding reported. Reading stroke handbook about shoulder pain and subluxation  ROS: Patient denies fever, rash, sore throat, blurred vision, dizziness, nausea, vomiting, diarrhea, cough, shortness of breath or chest pain, joint or back/neck pain, headache, or mood change.    Objective:   No results found. Recent Labs    10/27/23 0507 10/28/23 0602  WBC 6.4 4.6  HGB 8.8* 8.1*  HCT 28.6* 25.2*  PLT 254 276   Recent Labs    10/27/23 0507 10/28/23 0602  NA 131* 129*  K 3.5 3.1*  CL 97* 95*  CO2 23 26  GLUCOSE 94 90  BUN 26* 21  CREATININE 0.78 0.79  CALCIUM 8.7* 8.4*    Intake/Output Summary (Last 24 hours) at 10/28/2023 1026 Last data filed at 10/28/2023 1914 Gross per 24 hour  Intake 120 ml  Output --  Net 120 ml     Pressure Injury 10/26/23 Coccyx Mid Stage 1 -  Intact skin with non-blanchable redness of a localized area usually over a bony prominence. (Active)  10/26/23 1220  Location: Coccyx  Location Orientation: Mid  Staging: Stage 1 -  Intact skin with non-blanchable redness of a localized area usually over a bony prominence.  Wound Description (Comments):   Present on Admission: Yes    Physical Exam: Vital Signs Blood pressure 117/65, pulse 77, temperature 97.9 F (36.6 C), resp. rate 18, height 5\' 2"  (1.575 m), weight 38.7 kg, SpO2 100%.  Constitutional: No distress . Vital signs reviewed. HEENT: NCAT, EOMI, oral membranes moist, HOH but has hearing aid in today.  Neck: supple Cardiovascular: RRR without murmur. No JVD    Respiratory/Chest: CTA Bilaterally without wheezes or rales. Normal effort    GI/Abdomen: BS +, non-tender, non-distended Ext: no clubbing, cyanosis, or edema Psych: pleasant and cooperative  Skin: Clean and intact without signs of breakdown. A few  bruises Neuro:  Alert and oriented to person, place month/year, why she's here. HOH. Mild left central VII. Speech sl dysarthric. MMT: RUE and RLE 4+ to 5/5 throughout. LUE 4 to 4+/5. LLE 4+ to 5-/5.Sensory exam normal for light touch and pain in all 4 limbs. No limb ataxia or cerebellar signs. No abnormal tone appreciated.  Prior neuro assessment is c/w today's exam 10/28/2023.  Musculoskeletal: Full ROM, No pain with AROM or PROM in the neck, trunk, or extremities. Posture appropriate    Assessment/Plan: 1. Functional deficits which require 3+ hours per day of interdisciplinary therapy in a comprehensive inpatient rehab setting. Physiatrist is providing close team supervision and 24 hour management of active medical problems listed below. Physiatrist and rehab team continue to assess barriers to discharge/monitor patient progress toward functional and medical goals  Care Tool:  Bathing    Body parts bathed by patient: Right arm, Left arm, Chest, Abdomen, Front perineal area, Buttocks, Right upper leg, Left upper leg, Right lower leg, Left lower leg, Face         Bathing assist Assist Level: Contact Guard/Touching assist     Upper Body Dressing/Undressing Upper body dressing   What is the patient wearing?: Bra,  Pull over shirt    Upper body assist Assist Level: Set up assist    Lower Body Dressing/Undressing Lower body dressing      What is the patient wearing?: Underwear/pull up, Pants     Lower body assist Assist for lower body dressing: Contact Guard/Touching assist     Toileting Toileting    Toileting assist Assist for toileting: Contact Guard/Touching assist     Transfers Chair/bed transfer  Transfers assist     Chair/bed transfer assist level: Contact Guard/Touching assist     Locomotion Ambulation   Ambulation assist      Assist level: Contact Guard/Touching assist Assistive device: Rollator Max distance: 220 ft   Walk 10 feet  activity   Assist     Assist level: Supervision/Verbal cueing Assistive device: Rollator   Walk 50 feet activity   Assist    Assist level: Contact Guard/Touching assist Assistive device: Rollator    Walk 150 feet activity   Assist    Assist level: Contact Guard/Touching assist Assistive device: Rollator    Walk 10 feet on uneven surface  activity   Assist     Assist level: Contact Guard/Touching assist Assistive device: Rollator   Wheelchair     Assist Is the patient using a wheelchair?: No   Wheelchair activity did not occur: N/A         Wheelchair 50 feet with 2 turns activity    Assist    Wheelchair 50 feet with 2 turns activity did not occur: N/A       Wheelchair 150 feet activity     Assist  Wheelchair 150 feet activity did not occur: N/A       Blood pressure 117/65, pulse 77, temperature 97.9 F (36.6 C), resp. rate 18, height 5\' 2"  (1.575 m), weight 38.7 kg, SpO2 100%.  Medical Problem List and Plan: 1. Functional deficits secondary to right MCA territory infarction involving the right insula and overlying frontal parietal lobes/occlusion of distal M2 branch of the right MCA             -patient may  shower             -ELOS/Goals:  10-14 days --Continue CIR therapies including PT, OT, and SLP. We discussed shoulder subluxation and that it wasn't a big concern for her given her shoulder strength.   2.  Antithrombotics: -DVT/anticoagulation:  Mechanical: Antiembolism stockings, thigh (TED hose) Bilateral lower extremities             -antiplatelet therapy: Aspirin  81 mg daily and Plavix  75 mg day x 90 days then aspirin  alone 3. Pain Management: Tylenol  as needed 4. Mood/Behavior/Sleep: Xanax  0.125-0.25 mg daily as needed, melatonin 3 mg nightly as needed             -antipsychotic agents: N/A 5. Neuropsych/cognition: This patient is capable of making decisions on her own behalf. 6. Skin/Wound Care: Routine skin checks 7.  Fluids/Electrolytes/Nutrition: continue to encourage po intake  -5/22 BUN improved. Continue to push fluids    -potassium dipped to 3.1, begin kdur daily    -sodium lower today. Will hold lasix  tomorrow (has already received today) 8.  Atrial fibrillation/Cards.  Followed by cardiology service Dr. Londa Rival.  Anticoagulation recently discontinued due to GI bleed.  Toprol -XL 50 mg daily.   -Currently heart rate controlled -hold lasix  (40mg  every other day Th and 60mh every other day Wed) given #7 9.  Type 2 diabetes mellitus.  Hemoglobin A1c 5.2.  SSI 10.  Acute on chronic anemia.  Ferritin on 324 mg every 48 hours.    -5/22HGB down to 8.8  and now 8.1 from 11 4 days ago. No gross blood     -mild epistaxis--none further reported--coninue saline spray  -continue to monitor--see #14 11.  Hypothyroidism.  Synthroid  12.  Hypertension.  Continue Toprol -XL 75 mg daily, Lasix  40 mg every 48 hours alternating with 60 mg every 48 hours  -5/20 bp well controlled 13.  Constipation.  Home regimen of Colace 100 mg twice daily, Metamucil 1 packet twice daily. 14.  UGI bleed:  Recent admission 09/22/2023 followed by Dr. Alita Irwin.  Prior endoscopy and colonoscopy showed gastritis, H. pylori, diverticulosis and hemorrhoids.  Patient follow-up outpatient GI services   -see #10  -monitor closely   -started PPI  -don't know that there is much point to checking stool for OB given this is a known issue. Will reach out to GI for recs.    LOS: 2 days A FACE TO FACE EVALUATION WAS PERFORMED  Rawland Caddy 10/28/2023, 10:26 AM

## 2023-10-28 NOTE — Consult Note (Signed)
 Consultation  Referring Provider: Rehab/[facility] Primary Care Physician:  Orlena Bitters, MD Primary Gastroenterologist: [facility]/Dr. Ahmed  Reason for Consultation: Drifting hemoglobin  HPI: Shelly Fields is a 89 y.o. female, admitted on 10/21/2023, after a fall at home, found the following day by her family and noted to have significant left-sided weakness MRI showed right MCA CVA, and patient was started on aspirin  and Plavix . Transferred to rehab on 10/26/2023.  Patient also has history of atrial fibrillation, hypothyroidism, adult onset diabetes mellitus, history of iron  deficiency anemia.  He is currently doing well in rehab, but has been noted to have a drop in her hemoglobin over the past 3 to 4 days. On 10/21/2023 hemoglobin was 11.6/hematocrit 37.3/MCV 89/platelets 241 Today hemoglobin down to 8.1/hematocrit 25.2  Patient says she feels fine, has been dissipating in rehab, she has no complaints of abdominal discomfort.  She does mention that she has been constipated over the past few days and would like something for her bowels.  She usually takes a fiber supplement chronically and a stool softener at home.  She has not had any problems with nausea or vomiting. She says over the past year or so she has noticed occasional scant bright red blood on the tissue, has not been aware of any overt melena or hematochezia.  She does have known internal and external hemorrhoids but says that they do not bother her.  She has had prior GI workup with [facility] with EGD and Colonoscopy 10 /24 done for iron  deficiency anemia. EGD -mild gastritis, H Pylori + and treated. Colonoscopy-internal hemorrhoids, large external hemorrhoids noted, scattered left colon diverticulosis-at that time the external hemorrhoids were felt to be the source of her iron  deficiency anemia and rectal bleeding. She was apparently to be referred for surgical consultation but that has not occurred. She was also  seen by [facility] during recent admission at [facility] in April 2025 when she was admitted with generalized weakness fatigue lethargy and found to have anemia with hemoglobin of 5.9  Documented heme positive at that time, was transfused units of packed RBCs during that admission hemoglobin up to 9.7 Anemia felt secondary to her hemorrhoids and again evaluation with colorectal surgery was recommended  [place] on discharge 09/24/2023 11.4.  Patient is aware that she was supposed to see somebody in [place] about her anemia and bleeding issues I suspect this was a Manufacturing engineer.   Past Medical History:  Diagnosis Date   Cor pulmonale (chronic) (HCC)    Diverticulosis    Essential hypertension    Hypothyroidism    Internal hemorrhoids    Persistent atrial fibrillation (HCC)    Pulmonary hypertension (HCC)    Seasonal allergies    Shingles     Past Surgical History:  Procedure Laterality Date   ABDOMINAL HYSTERECTOMY     BIOPSY  03/25/2023   Procedure: BIOPSY;  Surgeon: Hargis Lias, MD;  [facility];  Service: Endoscopy;;   CATARACT EXTRACTION     COLONOSCOPY N/A 01/12/2013   Procedure: COLONOSCOPY;  Surgeon: Ruby Corporal, MD;  [facility]: [facility];  Service: Endoscopy;  Laterality: N/A;  1030   COLONOSCOPY N/A 11/18/2017   Procedure: COLONOSCOPY;  Surgeon: Ruby Corporal, MD;  [facility]: [facility];  Service: Endoscopy;  Laterality: N/A;   COLONOSCOPY WITH PROPOFOL  N/A 03/25/2023   Procedure: COLONOSCOPY WITH PROPOFOL ;  Surgeon: Hargis Lias, MD;  [facility]: [facility];  Service: Endoscopy;  Laterality:  N/A;  11:00AM;ASA 3   ESOPHAGOGASTRODUODENOSCOPY (EGD) WITH PROPOFOL  N/A 03/25/2023   Procedure: ESOPHAGOGASTRODUODENOSCOPY (EGD) WITH PROPOFOL ;  Surgeon: Hargis Lias, MD;  [facility]: [facility];  Service: Endoscopy;  Laterality: N/A;  11:00;ASA 3   LESION REMOVAL Right 09/13/2023   Procedure: excision of right lower extremity squamous  cell carcinoma;  Surgeon: Marijo Shove, DO;  Location: [facility];  Service: General;  Laterality: Right;   POLYPECTOMY  11/18/2017   Procedure: POLYPECTOMY;  Surgeon: Ruby Corporal, MD;  [facility]: [facility];  Service: Endoscopy;;  colon    Prior to Admission medications   Medication Sig Start Date End Date Taking? Authorizing Provider  ALPRAZolam  (XANAX ) 0.25 MG tablet Take 0.5-1 tablets (0.125-0.25 mg total) by mouth daily as needed for anxiety or sleep. 10/25/23   Mason Sole, Pratik D, DO  aspirin  EC 81 MG tablet Take 1 tablet (81 mg total) by mouth daily. Swallow whole. 10/26/23   Mason Sole, Pratik D, DO  Carboxymethylcellulose Sodium (THERATEARS OP) Place 1 drop into both eyes daily.     [provider]  clopidogrel  (PLAVIX ) 75 MG tablet Take 1 tablet (75 mg total) by mouth daily. 10/26/23 01/24/24  Mason Sole, Pratik D, DO  dapagliflozin  propanediol (FARXIGA ) 10 MG TABS tablet Take 1 tablet (10 mg total) by mouth daily before breakfast. 10/13/23   Gerard Knight, MD  denosumab  (PROLIA ) 60 MG/ML SOSY injection Inject 60 mg into the skin every 6 (six) months.    [provider]  docusate sodium  (COLACE) 100 MG capsule Take 1 capsule (100 mg total) by mouth 2 (two) times daily. 09/13/23 09/12/24  Pappayliou, Bynum Cassis A, DO  ferrous gluconate  (FERGON) 324 MG tablet Take 1 tablet (324 mg total) by mouth every other day. 03/25/23 09/01/24  Hargis Lias, MD  furosemide  (LASIX ) 40 MG tablet Take 40 mg Daily alternating with 60 mg Daily Patient taking differently: Take 40-60 mg by mouth daily. Take 40 mg Daily alternating with 60 mg Daily 10/14/23   Gerard Knight, MD  levothyroxine  (SYNTHROID ) 75 MCG tablet Take 75 mcg by mouth daily before breakfast.    [provider]  metoprolol  succinate (TOPROL -XL) 50 MG 24 hr tablet TAKE 1 AND 1/2 TABLETS BY MOUTH  TWICE DAILY 04/08/23   Gerard Knight, MD  potassium chloride  SA (KLOR-CON  M) 20 MEQ tablet Take 1 tablet (20 mEq  total) by mouth daily. 03/10/23   Gerard Knight, MD  psyllium (METAMUCIL) 58.6 % packet Take 1 packet by mouth 2 (two) times daily. Patient taking differently: Take 1 packet by mouth at bedtime. 03/25/23   Hargis Lias, MD    Current Facility-Administered Medications  Medication Dose Route Frequency Provider Last Rate Last Admin   acetaminophen  (TYLENOL ) tablet 650 mg  650 mg Oral Q6H PRN Angiulli, Daniel J, PA-C       Or   acetaminophen  (TYLENOL ) suppository 650 mg  650 mg Rectal Q6H PRN Angiulli, Daniel J, PA-C       ALPRAZolam  (XANAX ) tablet 0.125 mg  0.125 mg Oral Daily PRN Sterling Eisenmenger, PA-C       aspirin  EC tablet 81 mg  81 mg Oral Daily Angiulli, Daniel J, PA-C   81 mg at 10/28/23 6440   clopidogrel  (PLAVIX ) tablet 75 mg  75 mg Oral Daily Angiulli, Daniel J, PA-C   75 mg at 10/28/23 3474   docusate sodium  (COLACE) capsule 100 mg  100 mg Oral BID Angiulli, Daniel J, PA-C  100 mg at 10/28/23 0758   ferrous gluconate  (FERGON) tablet 324 mg  324 mg Oral Q48H Angiulli, Daniel J, PA-C   324 mg at 10/28/23 1030   levothyroxine  (SYNTHROID ) tablet 75 mcg  75 mcg Oral Q0600 Angiulli, Daniel J, PA-C   75 mcg at 10/28/23 1610   melatonin tablet 3 mg  3 mg Oral QHS PRN Sterling Eisenmenger, PA-C   3 mg at 10/26/23 2003   metoprolol  succinate (TOPROL -XL) 24 hr tablet 75 mg  75 mg Oral Daily Angiulli, Daniel J, PA-C   75 mg at 10/28/23 9604   ondansetron  (ZOFRAN ) tablet 4 mg  4 mg Oral Q6H PRN Angiulli, Daniel J, PA-C       Or   ondansetron  (ZOFRAN ) injection 4 mg  4 mg Intravenous Q6H PRN Angiulli, Daniel J, PA-C       pantoprazole  (PROTONIX ) EC tablet 40 mg  40 mg Oral Daily Swartz, Zachary T, MD   40 mg at 10/28/23 0758   polyethylene glycol (MIRALAX  / GLYCOLAX ) packet 17 g  17 g Oral BID Kynsie Falkner S, PA-C       psyllium (HYDROCIL/METAMUCIL) 1 packet  1 packet Oral BID Angiulli, Daniel J, PA-C   1 packet at 10/28/23 0758   sodium chloride  (OCEAN) 0.65 % nasal spray 1 spray  1  spray Each Nare TID Rawland Caddy, MD   1 spray at 10/28/23 1504    Allergies as of 10/26/2023   (No Known Allergies)    Family History  Problem Relation Age of Onset   Heart disease Mother    Hypertension Father    Colon cancer Neg Hx     Social History   Socioeconomic History   Marital status: Widowed    Spouse name: Not on file   Number of children: Not on file   Years of education: Not on file   Highest education level: Not on file  Occupational History   Not on file  Tobacco Use   Smoking status: Never    Passive exposure: Past   Smokeless tobacco: Never  Vaping Use   Vaping status: Never Used  Substance and Sexual Activity   Alcohol use: No    Alcohol/week: 0.0 standard drinks of alcohol   Drug use: No   Sexual activity: Not on file  Other Topics Concern   Not on file  Social History Narrative   Not on file   Social Drivers of Health   Financial Resource Strain: Not on file  Food Insecurity: No Food Insecurity (10/21/2023)   Hunger Vital Sign    Worried About Running Out of Food in the Last Year: Never true    Ran Out of Food in the Last Year: Never true  Transportation Needs: No Transportation Needs (10/21/2023)   PRAPARE - Administrator, Civil Service (Medical): No    Lack of Transportation (Non-Medical): No  Physical Activity: Not on file  Stress: Not on file  Social Connections: Moderately Integrated (10/21/2023)   Social Connection and Isolation Panel [NHANES]    Frequency of Communication with Friends and Family: More than three times a week    Frequency of Social Gatherings with Friends and Family: More than three times a week    Attends Religious Services: 1 to 4 times per year    Active Member of [employer] or Organizations: Patient declined    Attends Banker Meetings: 1 to 4 times per year    Marital Status: Widowed  Intimate Partner Violence: Not At Risk (10/21/2023)   Humiliation, Afraid, Rape, and Kick questionnaire     Fear of Current or Ex-Partner: No    Emotionally Abused: No    Physically Abused: No    Sexually Abused: No    Review of Systems: Pertinent positive and negative review of systems were noted in the above HPI section.  All other review of systems was otherwise negative.   Physical Exam: Vital signs in last 24 hours: Temp:  [97.6 F (36.4 C)-98.7 F (37.1 C)] 97.6 F (36.4 C) (05/22 1312) Pulse Rate:  [68-85] 68 (05/22 1312) Resp:  [16-18] 16 (05/22 1312) BP: (112-119)/(47-65) 119/53 (05/22 1312) SpO2:  [100 %] 100 % (05/22 1312) Last BM Date : 10/28/23 General:   Alert,  Well-developed, thin, frail-appearing elderly white female pleasant and cooperative in NAD-hard of hearing does well with hearing aids in Head:  Normocephalic and atraumatic. Eyes:  Sclera clear, no icterus.   Conjunctiva pink. Ears:  Normal auditory acuity. Nose:  No deformity, discharge,  or lesions. Mouth:  No deformity or lesions.   Neck:  Supple; no masses or thyromegaly. Lungs:  Clear throughout to auscultation.   No wheezes, crackles, or rhonchi.  Heart:  Regular rate and rhythm; no murmurs, clicks, rubs,  or gallops. Abdomen:  Soft,nontender, BS active,nonpalp mass or hsm.   Rectal: External hemorrhoids noted, nontender to exam, dark brownish-black stool strongly heme positive, no lesion palpated Msk:  Symmetrical without gross deformities. . Pulses:  Normal pulses noted. Extremities:  Without clubbing or edema. Neurologic:  Alert and  oriented x4;  grossly normal neurologically.  HOH Skin:  Intact without significant lesions or rashes.. Psych:  Alert and cooperative. Normal mood and affect.  Intake/Output from previous day: No intake/output data recorded. Intake/Output this shift: Total I/O In: 300 [P.O.:300] Out: -   Lab Results: Recent Labs    10/27/23 0507 10/28/23 0602  WBC 6.4 4.6  HGB 8.8* 8.1*  HCT 28.6* 25.2*  PLT 254 276   BMET Recent Labs    10/27/23 0507 10/28/23 0602   NA 131* 129*  K 3.5 3.1*  CL 97* 95*  CO2 23 26  GLUCOSE 94 90  BUN 26* 21  CREATININE 0.78 0.79  CALCIUM 8.7* 8.4*   LFT Recent Labs    10/27/23 0507  PROT 5.9*  ALBUMIN  2.8*  AST 17  ALT 12  ALKPHOS 65  BILITOT 0.7   PT/INR No results for input(s): "LABPROT", "INR" in the last 72 hours. Hepatitis Panel No results for input(s): "HEPBSAG", "HCVAB", "HEPAIGM", "HEPBIGM" in the last 72 hours.    IMPRESSION:  #29 89 year old female admitted 10/21/2023 with acute right CVA, started on aspirin  and Plavix . In rehab and progressing well  #2 history of iron  deficiency anemia, recurrent episodes of anemia requiring transfusion.  Had hemoglobin down to 5.9 during an admission in April at [facility], required transfusions, documented heme positive.  Also managed for acute respiratory failure during that admission, pneumonia and acute on chronic congestive heart failure/cor pulmonale  Previous GI work up with EGD and colonoscopy October 2024/[facility] for iron  deficiency anemia With finding of mild gastritis which was H. pylori positive and treated in a colonoscopy found to have internal and external hemorrhoids with external hemorrhoids felt to be the source of her bleeding No further GI workup during the most recent admission in April 2025, recommendation was to be seen by colorectal surgery as an outpatient.  She has now had a 3  g drop in hemoglobin over the past 6 days with hemoglobin 8.1 today  On exam she does have external hemorrhoids that did not appear to be bleeding, on rectal exam no fresh blood but has very dark brownish-black strongly heme positive stool  Suspect that she may have another source for her chronic slow GI blood loss, now exacerbated in setting of aspirin  and Plavix  Need to rule out small bowel source i.e. AVMs  #3 permanent atrial fibrillation #4 chronic diastolic heart failure/right heart failure #5 hypothyroidism    PLAN: Continue to trend  hemoglobin daily Transfuse for hemoglobin 7.5 or less Hopefully will not have to hold Plavix -will continue for now Plan for capsule endoscopy tomorrow.  Procedure was discussed in detail with including indications risks and benefits and she is agreeable to proceed. images can be interpreted on Saturday, 10/30/2023 N.p.o. after midnight until swallow's capsule tomorrow Start MiraLAX  twice daily short-term/obstipated GI will follow with you   Breton Berns PA-C 10/28/2023, 5:26 PM

## 2023-10-29 ENCOUNTER — Encounter (HOSPITAL_COMMUNITY)
Admission: AD | Disposition: A | Discharge status: Home Health | Payer: Self-pay | Source: Other Acute Inpatient Hospital | Attending: Physical Medicine & Rehabilitation

## 2023-10-29 DIAGNOSIS — K2951 Unspecified chronic gastritis with bleeding: Secondary | ICD-10-CM | POA: Diagnosis not present

## 2023-10-29 DIAGNOSIS — I482 Chronic atrial fibrillation, unspecified: Secondary | ICD-10-CM

## 2023-10-29 DIAGNOSIS — I63511 Cerebral infarction due to unspecified occlusion or stenosis of right middle cerebral artery: Secondary | ICD-10-CM | POA: Diagnosis not present

## 2023-10-29 DIAGNOSIS — K31819 Angiodysplasia of stomach and duodenum without bleeding: Secondary | ICD-10-CM

## 2023-10-29 HISTORY — PX: GIVENS CAPSULE STUDY: SHX5432

## 2023-10-29 LAB — CBC
HCT: 22.9 % — ABNORMAL LOW (ref 36.0–46.0)
Hemoglobin: 7.4 g/dL — ABNORMAL LOW (ref 12.0–15.0)
MCH: 27.7 pg (ref 26.0–34.0)
MCHC: 32.3 g/dL (ref 30.0–36.0)
MCV: 85.8 fL (ref 80.0–100.0)
Platelets: 267 10*3/uL (ref 150–400)
RBC: 2.67 MIL/uL — ABNORMAL LOW (ref 3.87–5.11)
RDW: 16 % — ABNORMAL HIGH (ref 11.5–15.5)
WBC: 5.4 10*3/uL (ref 4.0–10.5)
nRBC: 0 % (ref 0.0–0.2)

## 2023-10-29 LAB — BASIC METABOLIC PANEL WITH GFR
Anion gap: 8 (ref 5–15)
Anion gap: 9 (ref 5–15)
BUN: 18 mg/dL (ref 8–23)
BUN: 18 mg/dL (ref 8–23)
CO2: 26 mmol/L (ref 22–32)
CO2: 24 mmol/L (ref 22–32)
Calcium: 7.9 mg/dL — ABNORMAL LOW (ref 8.9–10.3)
Calcium: 8.5 mg/dL — ABNORMAL LOW (ref 8.9–10.3)
Chloride: 94 mmol/L — ABNORMAL LOW (ref 98–111)
Chloride: 94 mmol/L — ABNORMAL LOW (ref 98–111)
Creatinine, Ser: 0.87 mg/dL (ref 0.44–1.00)
Creatinine, Ser: 0.75 mg/dL (ref 0.44–1.00)
GFR, Estimated: >60 mL/min
GFR, Estimated: >60 mL/min (ref >60)
Glucose, Bld: 92 mg/dL (ref 70–99)
Glucose, Bld: 81 mg/dL (ref 70–99)
Potassium: 2.8 mmol/L — ABNORMAL LOW (ref 3.5–5.1)
Potassium: 3.2 mmol/L — ABNORMAL LOW (ref 3.5–5.1)
Sodium: 128 mmol/L — ABNORMAL LOW (ref 135–145)
Sodium: 127 mmol/L — ABNORMAL LOW (ref 135–145)

## 2023-10-29 LAB — PREPARE RBC (CROSSMATCH)

## 2023-10-29 SURGERY — IMAGING PROCEDURE, GI TRACT, INTRALUMINAL, VIA CAPSULE

## 2023-10-29 MED ORDER — ACETAMINOPHEN 325 MG PO TABS
650.0000 mg | ORAL_TABLET | Freq: Once | ORAL | Status: AC
  Administered 2023-10-29: 650 mg via ORAL
  Filled 2023-10-29: qty 2

## 2023-10-29 MED ORDER — POTASSIUM CHLORIDE CRYS ER 20 MEQ PO TBCR
40.0000 meq | EXTENDED_RELEASE_TABLET | Freq: Two times a day (BID) | ORAL | Status: DC
  Administered 2023-10-29 – 2023-11-01 (×7): 40 meq via ORAL
  Filled 2023-10-29 (×7): qty 2

## 2023-10-29 MED ORDER — ACETAMINOPHEN 325 MG PO TABS
650.0000 mg | ORAL_TABLET | Freq: Four times a day (QID) | ORAL | Status: AC | PRN
Start: 2023-10-29 — End: ?

## 2023-10-29 MED ORDER — SODIUM CHLORIDE 0.9% IV SOLUTION: Freq: Once | INTRAVENOUS | Status: AC

## 2023-10-29 NOTE — Progress Notes (Signed)
 Physical Therapy Session Note  Patient Details  Name: Shelly Fields MRN: [id number] Date of Birth: 1935/04/20  Today's Date: 10/29/2023 PT Individual Time: 0908-1003 PT Individual Time Calculation (min): 55 min   Short Term Goals: Week 1:  PT Short Term Goal 1 (Week 1): STG = LTG d/t ELOS  Skilled Therapeutic Interventions/Progress Updates: Patient sitting in recliner on entrance to room. Patient alert and agreeable to PT session.   Patient reported no complaints of pain. Pt daughter-in-law present and accompanied pt during session. Pt ambulated from room<main gym in rollator with supervision for safety. Pt participated in 4" curb navigation with rollator to assess safe plan at home from household to yard. Pt cued to lock brakes when advancing rollator to and from curb with safe technique (step up close to curb and guide rollator onto step 2 wheels at a time vs picking up entire rollator). Pt had trouble remembering sequence due to the various steps required to do this method safely (lock brakes, 2 wheels at a time, not to disengage brakes until safely on curb or floor, avoid increasing anterior lean). Pt utilized RW and performed task safely. Pt instructed with family concurring to use RW to navigate household and curb, and to keep rollator outside at bottom of step (will be under shelter to avoid rain) for outside and community purposes with pt understanding. Pt transported outside [facility] to ambulated on grass as pt will want to do so at home. Pt ambulated short distance on grass with rollator, but pt unable to safely do so with hospital rollator wheels. Pt family stated that pt's rollator at home has bigger wheels, and will bring it in to use later in the afternoon. Pt transported back to room in [facility] dependently.   Patient sitting in recliner at end of session with brakes locked, family present, and all needs within reach.      Therapy Documentation Precautions:  Precautions Precautions:  Fall Precaution/Restrictions Comments: mild L hemipareisis Restrictions Weight Bearing Restrictions Per Provider Order: No  Therapy/Group: Individual Therapy  Dhanya Bogle PTA 10/29/2023, 3:36 PM

## 2023-10-29 NOTE — Plan of Care (Signed)
  Problem: RH SKIN INTEGRITY Goal: RH STG MAINTAIN SKIN INTEGRITY WITH ASSISTANCE Description: STG Maintain Skin Integrity With Min Assistance. Outcome: Progressing   Problem: RH SAFETY Goal: RH STG ADHERE TO SAFETY PRECAUTIONS W/ASSISTANCE/DEVICE Description: STG Adhere to Safety Precautions With Min Assistance/Device. Outcome: Progressing   Problem: RH KNOWLEDGE DEFICIT Goal: RH STG INCREASE KNOWLEDGE OF STROKE PROPHYLAXIS Description: Patient will demonstrate knowledge of medications to assist with stroke prevention with educational materials and handouts provided by staff. Outcome: Progressing

## 2023-10-29 NOTE — Progress Notes (Signed)
 Occupational Therapy Session Note  Patient Details  Name: Shelly Fields MRN: [id number] Date of Birth: 11-08-1934  Today's Date: 10/29/2023 OT Individual Time: 6440-3474 OT Individual Time Calculation (min): 60 min    Short Term Goals: Week 1:  OT Short Term Goal 1 (Week 1): LTG=STG overall mod I goals OT Short Term Goal 2 (Week 1): Pt will perform simple meal prep activity with AD with mod I  Skilled Therapeutic Interventions/Progress Updates:    Pt received in recliner with her daughter in law present.  Pt needed to receive a blood draw but during this time spoke with pt and her daughter about her progress and what areas she can continue to work on (balance).  Pt is wearing a monitor to track her endoscopy capsule so unable to offer her a shower, but pt dressed.  Pt agreeable to ambulating to tub room to demonstrate to her dtr how she steps over tub with use of the grab bars. Discussed recommendations for bars in tub, hand held shower with wall attachement for shower head, and shower seat.  Pt has thin foam bathmats that should be safe with the Rw. Pt does have mod I goals and wants to go home independently but pt and dtr both agree 1 full week of full supervision is recommended to fully ensure pt is safe to be home independently and she will supervise the pt getting in and out of the shower. Pt has a BSC at home and discussed leaving it next to the bed at night as her bathroom is down the hallway. Pt practiced sit to stands from regular toilet without difficulty.  Discussed other home activities such as cooking and cleaning and what activities to have supervision with.    Assessed L hand sensation with localization touch and temperature and pt able to identify touch and heat well.  Ambulated back to room with RW with supervision and then used bathroom with distant supervision.  Resting in recliner with all needs met. Belt alarm on.  Therapy Documentation Precautions:   Precautions Precautions: Fall Precaution/Restrictions Comments: mild L hemipareisis Restrictions Weight Bearing Restrictions Per Provider Order: No  Pain: Pain Assessment Pain Scale: 0-10 Pain Score: 0-No pain    Therapy/Group: Individual Therapy  Guilherme Schwenke 10/29/2023, 12:52 PM

## 2023-10-29 NOTE — Progress Notes (Signed)
 Occupational Therapy Session Note  Patient Details  Name: [name] MRN: [id number] Date of Birth: 05/09/1935  Today's Date: 10/29/2023 OT Individual Time: 4098-1191 OT Individual Time Calculation (min): 70 min    Short Term Goals: Week 1:  OT Short Term Goal 1 (Week 1): LTG=STG overall mod I goals OT Short Term Goal 2 (Week 1): Pt will perform simple meal prep activity with AD with mod I  Skilled Therapeutic Interventions/Progress Updates:  Pt greeted seated in recliner, pt agreeable to OT intervention.      Transfers/bed mobility/functional mobility: pt completed all functional ambulation with rollator with supervision with pt needed intermittent MIN cues to recall technique for locking brakes.     IADLS:pt completed meal prep task in kitchen with an emphasis on standing tolerance, dynamic standing balance, bilateral integration, LUE FMC, safety in kitchen and rollator mgmt during IADLS. Pt given written recipe with pt able to interpret novel directions to complete task with supervision. Pt able to use LUE to knead dough to make dog treats using cookie cutters to facilitate improved grip strength and coordination for higher level IADL tasks. Pt able to stand for > 10 mins to complete task. Pt did take intermittent seated rest breaks on rollator seat as needed. Education provided on kitchen safety/ fall prevention strategies.                  Ended session with pt supine in bed with all needs within reach and bed alarm activated.                    Therapy Documentation Precautions:  Precautions Precautions: Fall Precaution/Restrictions Comments: mild L hemipareisis Restrictions Weight Bearing Restrictions Per Provider Order: No  Pain: No pain    Therapy/Group: Individual Therapy  Willadean Hark 10/29/2023, 6:44 PM

## 2023-10-29 NOTE — Progress Notes (Incomplete)
 Patient ID: Shelly Fields, female   DOB: 1934/08/11, 89 y.o.   MRN: [id number]    Progress Note   Subjective  ***   Objective   Vital signs in last 24 hours: Temp:  [97.5 F (36.4 C)-98.3 F (36.8 C)] 97.5 F (36.4 C) (05/23 1310) Pulse Rate:  [71-80] 71 (05/23 1310) Resp:  [16-18] 16 (05/23 1310) BP: (119-124)/(53-60) 124/57 (05/23 1310) SpO2:  [100 %] 100 % (05/23 1310) Last BM Date : 10/28/23 General:    white female in NAD Heart:  Regular rate and rhythm; no murmurs Lungs: Respirations even and unlabored, lungs CTA bilaterally Abdomen:  Soft, nontender and nondistended. Normal bowel sounds. Extremities:  Without edema. Neurologic:  Alert and oriented,  grossly normal neurologically. Psych:  Cooperative. Normal mood and affect.  Intake/Output from previous day: 05/22 0701 - 05/23 0700 In: 536 [P.O.:536] Out: -  Intake/Output this shift: No intake/output data recorded.  Lab Results: Recent Labs    10/27/23 0507 10/28/23 0602 10/29/23 0611  WBC 6.4 4.6 5.4  HGB 8.8* 8.1* 7.4*  HCT 28.6* 25.2* 22.9*  PLT 254 276 267   BMET Recent Labs    10/27/23 0507 10/28/23 0602 10/29/23 0611  NA 131* 129* 128*  K 3.5 3.1* 2.8*  CL 97* 95* 94*  CO2 23 26 26   GLUCOSE 94 90 92  BUN 26* 21 18  CREATININE 0.78 0.79 0.87  CALCIUM 8.7* 8.4* 7.9*   LFT Recent Labs    10/27/23 0507  PROT 5.9*  ALBUMIN  2.8*  AST 17  ALT 12  ALKPHOS 65  BILITOT 0.7   PT/INR No results for input(s): "LABPROT", "INR" in the last 72 hours.  Studies/Results: No results found.     Assessment / Plan:   ***   Discharge Planning Diet:** Anticoagulation and antiplatelets:*** Discharge Medications: *** Follow up: ***  Procedure Procedures planned and timing of procedure: Lab work ordered: *** Hold the following anticoagulation and/or antiplatelets for procedure? ***      Principal Problem:   Right middle cerebral artery stroke (HCC)     LOS: 3 days   Raylin Winer  EsterwoodPA-C  10/29/2023, 1:31 PM

## 2023-10-29 NOTE — Progress Notes (Signed)
 [facility] capsule endoscopy ordered by MD [name].  Patient ingested capsule at 0829.  Per Given's capsule instructions, patient to remain NPO until 1029 at which time they may progress to clear liquid diet. At 1229 patient may have a small snack such as a half a sandwich or a bowl of soup. At 1629 patient may progress to previously ordered diet.  Recorder and leads to remain on until study completed.  The capsule endoscopy study will conclude at 2029 at which time the recorder and leads or belt can be removed and placed in a patient belongings bag. Endoscopy staff will pick up the equipment in the AM.  Instructions provided to patient, family member, and inpatient RN. Patient, family member, and RN demonstrated understanding.  Kraig Peru, RN 10/29/23 8:31 AM

## 2023-10-29 NOTE — Plan of Care (Signed)
  Problem: Consults Goal: RH STROKE PATIENT EDUCATION Description: See Patient Education module for education specifics  Outcome: Progressing Goal: Diabetes Guidelines if Diabetic/Glucose > 140 Description: If diabetic or lab glucose is > 140 mg/dl - Initiate Diabetes/Hyperglycemia Guidelines & Document Interventions  Outcome: Progressing   Problem: RH SKIN INTEGRITY Goal: RH STG MAINTAIN SKIN INTEGRITY WITH ASSISTANCE Description: STG Maintain Skin Integrity With Min Assistance. Outcome: Progressing Goal: RH STG ABLE TO PERFORM INCISION/WOUND CARE W/ASSISTANCE Description: STG Able To Perform Incision/Wound Care With [employer]. Outcome: Progressing   Problem: RH SAFETY Goal: RH STG ADHERE TO SAFETY PRECAUTIONS W/ASSISTANCE/DEVICE Description: STG Adhere to Safety Precautions With Min Assistance/Device. Outcome: Progressing   Problem: RH COGNITION-NURSING Goal: RH STG USES MEMORY AIDS/STRATEGIES W/ASSIST TO PROBLEM SOLVE Description: STG Uses Memory Aids/Strategies With Min Assistance to Problem Solve. Outcome: Progressing   Problem: RH KNOWLEDGE DEFICIT Goal: RH STG INCREASE KNOWLEDGE OF DIABETES Description: Patient will demonstrate knowledge of diabetes medications, dietary recommendations, and blood sugar management with educational materials and handouts provided by staff. Outcome: Progressing Goal: RH STG INCREASE KNOWLEDGE OF HYPERTENSION Description: Patient will demonstrate knowledge of hypertension medications, dietary recommendations, and blood pressure management with educational materials and handouts provided by staff.  Outcome: Progressing Goal: RH STG INCREASE KNOWLEGDE OF HYPERLIPIDEMIA Description: Patient will demonstrate knowledge of hyperlipidemia medications, dietary recommendations, and lab values with educational materials and handouts provided by staff.  Outcome: Progressing Goal: RH STG INCREASE KNOWLEDGE OF STROKE PROPHYLAXIS Description: Patient  will demonstrate knowledge of medications to assist with stroke prevention with educational materials and handouts provided by staff. Outcome: Progressing

## 2023-10-29 NOTE — IPOC Note (Signed)
 Overall Plan of Care [facility]) Patient Details Name: Shelly Fields MRN: [id number] DOB: 17-Apr-1935  Admitting Diagnosis: Right middle cerebral artery stroke [facility])  Hospital Problems: Principal Problem:   Right middle cerebral artery stroke [facility])     Functional Problem List: Nursing Endurance, Safety, Skin Integrity  PT Balance, Edema, Motor, Nutrition  OT Balance, Cognition, Endurance, Motor  SLP    TR         Basic ADL's: OT Bathing, Dressing, Toileting     Advanced  ADL's: OT Simple Meal Preparation     Transfers: PT Bed Mobility, Bed to Chair, Car, Occupational psychologist, Research scientist (life sciences): PT Ambulation, Stairs     Additional Impairments: OT None  SLP        TR      Anticipated Outcomes Item Anticipated Outcome  Self Feeding n/a  Swallowing      Basic self-care  mod I  Toileting  mod I   Bathroom Transfers mod I  Bowel/Bladder  Remain continent  Transfers  Mod I  Locomotion  Mod I  Communication     Cognition     Pain  <3  Safety/Judgment  Min assist with no falls   Therapy Plan: PT Intensity: Minimum of 1-2 x/day ,45 to 90 minutes PT Frequency: 5 out of 7 days PT Duration Estimated Length of Stay: 7 days OT Intensity: Minimum of 1-2 x/day, 45 to 90 minutes OT Frequency: 5 out of 7 days OT Duration/Estimated Length of Stay: ~ 7- 10 days     Team Interventions: Nursing Interventions Patient/Family Education, Disease Management/Prevention, Medication Management, Skin Care/Wound Management, Discharge Planning  PT interventions Ambulation/gait training, Cognitive remediation/compensation, Warden/ranger, Neuromuscular re-education, Patient/family education, Stair training, Therapeutic Exercise, UE/LE Coordination activities, UE/LE Strength taining/ROM, Therapeutic Activities, Psychosocial support, Functional mobility training, DME/adaptive equipment instruction, Discharge planning  OT Interventions Balance/vestibular  training, Discharge planning, Pain management, Self Care/advanced ADL retraining, Therapeutic Activities, UE/LE Coordination activities, Disease mangement/prevention, Functional mobility training, Patient/family education, Skin care/wound managment, Therapeutic Exercise, Community reintegration, DME/adaptive equipment instruction, Neuromuscular re-education, Psychosocial support, UE/LE Strength taining/ROM  SLP Interventions    TR Interventions    SW/CM Interventions Discharge Planning, Psychosocial Support, Patient/Family Education, Disease Management/Prevention   Barriers to Discharge MD  Medical stability  Nursing Wound Care Lives alone, 1 level, level entry. Tub/shower unit. Son to assist at discharge.  PT Decreased caregiver support, Insurance for SNF coverage, Weight underweight  OT      SLP      SW       Team Discharge Planning: Destination: PT-Home ,OT- Home , SLP-  Projected Follow-up: PT-Outpatient PT, OT-  Home health OT, SLP-  Projected Equipment Needs: PT-To be determined, OT- To be determined, SLP-  Equipment Details: PT- , OT-  Patient/family involved in discharge planning: PT- Patient,  OT-Patient, SLP-   MD ELOS: 7 days pending medical stability Medical Rehab Prognosis:  Excellent Assessment: The patient has been admitted for CIR therapies with the diagnosis of right MCA infarct. Course complicated by GI bleed. The team will be addressing functional mobility, strength, stamina, balance, safety, adaptive techniques and equipment, self-care, bowel and bladder mgt, patient and caregiver education, NMR, community reentry. Goals have been set at mod I for mobility and self-care. Anticipated discharge destination is home with family.        See Team Conference Notes for weekly updates to the plan of care

## 2023-10-29 NOTE — Progress Notes (Addendum)
 PROGRESS NOTE   Subjective/Complaints: Pt up in chair. RN in room to give capsule endo. She's in good spirits. Denies dizziness, sob, etc. Wants to do therapy today!  ROS: Patient denies fever, rash, sore throat, blurred vision, dizziness, nausea, vomiting, diarrhea, cough, shortness of breath or chest pain, joint or back/neck pain, headache, or mood change.    Objective:   No results found. Recent Labs    10/28/23 0602 10/29/23 0611  WBC 4.6 5.4  HGB 8.1* 7.4*  HCT 25.2* 22.9*  PLT 276 267   Recent Labs    10/28/23 0602 10/29/23 0611  NA 129* 128*  K 3.1* 2.8*  CL 95* 94*  CO2 26 26  GLUCOSE 90 92  BUN 21 18  CREATININE 0.79 0.87  CALCIUM 8.4* 7.9*    Intake/Output Summary (Last 24 hours) at 10/29/2023 0844 Last data filed at 10/29/2023 8295 Gross per 24 hour  Intake 416 ml  Output --  Net 416 ml     Pressure Injury 10/26/23 Coccyx Mid Stage 1 -  Intact skin with non-blanchable redness of a localized area usually over a bony prominence. (Active)  10/26/23 1220  Location: Coccyx  Location Orientation: Mid  Staging: Stage 1 -  Intact skin with non-blanchable redness of a localized area usually over a bony prominence.  Wound Description (Comments):   Present on Admission: Yes    Physical Exam: Vital Signs Blood pressure (!) 120/53, pulse 80, temperature 98.2 F (36.8 C), temperature source Oral, resp. rate 18, height 5\' 2"  (1.575 m), weight 38.7 kg, SpO2 100%.  Constitutional: No distress . Vital signs reviewed. HEENT: NCAT, EOMI, oral membranes moist Neck: supple Cardiovascular: RRR with murmur. No JVD    Respiratory/Chest: CTA Bilaterally without wheezes or rales. Normal effort    GI/Abdomen: BS +, non-tender, non-distended Ext: no clubbing, cyanosis, or edema Psych: pleasant and cooperative  Skin: Clean and intact without signs of breakdown. A few bruises Neuro:  Alert and oriented to person,  place month/year, why she's here. HOH. Mild left central VII. Speech sl dysarthric. MMT: RUE and RLE 4+ to 5/5 throughout. LUE 4 to 4+/5. LLE 4+ to 5-/5.Sensory exam normal for light touch and pain in all 4 limbs. No limb ataxia or cerebellar signs. No abnormal tone appreciated.  Prior neuro assessment is c/w today's exam 10/29/2023.  Musculoskeletal: Full ROM, No pain with AROM or PROM in the neck, trunk, or extremities. Posture appropriate    Assessment/Plan: 1. Functional deficits which require 3+ hours per day of interdisciplinary therapy in a comprehensive inpatient rehab setting. Physiatrist is providing close team supervision and 24 hour management of active medical problems listed below. Physiatrist and rehab team continue to assess barriers to discharge/monitor patient progress toward functional and medical goals  Care Tool:  Bathing    Body parts bathed by patient: Right arm, Left arm, Chest, Abdomen, Front perineal area, Buttocks, Right upper leg, Left upper leg, Right lower leg, Left lower leg, Face         Bathing assist Assist Level: Contact Guard/Touching assist     Upper Body Dressing/Undressing Upper body dressing   What is the patient wearing?: Bra, Pull over shirt  Upper body assist Assist Level: Set up assist    Lower Body Dressing/Undressing Lower body dressing      What is the patient wearing?: Underwear/pull up, Pants     Lower body assist Assist for lower body dressing: Contact Guard/Touching assist     Toileting Toileting    Toileting assist Assist for toileting: Contact Guard/Touching assist     Transfers Chair/bed transfer  Transfers assist     Chair/bed transfer assist level: Contact Guard/Touching assist     Locomotion Ambulation   Ambulation assist      Assist level: Contact Guard/Touching assist Assistive device: Rollator Max distance: 220 ft   Walk 10 feet activity   Assist     Assist level: Supervision/Verbal  cueing Assistive device: Rollator   Walk 50 feet activity   Assist    Assist level: Contact Guard/Touching assist Assistive device: Rollator    Walk 150 feet activity   Assist    Assist level: Contact Guard/Touching assist Assistive device: Rollator    Walk 10 feet on uneven surface  activity   Assist     Assist level: Contact Guard/Touching assist Assistive device: Rollator   Wheelchair     Assist Is the patient using a wheelchair?: No   Wheelchair activity did not occur: N/A         Wheelchair 50 feet with 2 turns activity    Assist    Wheelchair 50 feet with 2 turns activity did not occur: N/A       Wheelchair 150 feet activity     Assist  Wheelchair 150 feet activity did not occur: N/A       Blood pressure (!) 120/53, pulse 80, temperature 98.2 F (36.8 C), temperature source Oral, resp. rate 18, height 5\' 2"  (1.575 m), weight 38.7 kg, SpO2 100%.  Medical Problem List and Plan: 1. Functional deficits secondary to right MCA territory infarction involving the right insula and overlying frontal parietal lobes/occlusion of distal M2 branch of the right MCA             -patient may  shower             -ELOS/Goals:  10-14 days -Continue CIR therapies including PT, OT as tolerated today 2.  Antithrombotics: -DVT/anticoagulation:  Mechanical: Antiembolism stockings, thigh (TED hose) Bilateral lower extremities             -antiplatelet therapy:  -5/23- spoke with neurology. Will hold asa and plavix  for now pending GI findings and course. May only tolerate asa alone. (Previous plan: Aspirin  81 mg daily and Plavix  75 mg day x 90 days then aspirin  alone) 3. Pain Management: Tylenol  as needed 4. Mood/Behavior/Sleep: Xanax  0.125-0.25 mg daily as needed, melatonin 3 mg nightly as needed             -antipsychotic agents: N/A 5. Neuropsych/cognition: This patient is capable of making decisions on her own behalf. 6. Skin/Wound Care: Routine skin  checks 7. Fluids/Electrolytes/Nutrition: continue to encourage po intake  -5/23 further drop in sodium (128) and potassium (2.8) today     -holding lasix  should help with both   -increase kdur to 40meq bid   -recheck bmet this afternoon  8.  Atrial fibrillation/Cards.  Followed by cardiology service Dr. Londa Rival.  Anticoagulation recently discontinued due to GI bleed.  Toprol -XL 50 mg daily.   -Currently heart rate controlled -5/23 -holding lasix  (40mg  every other day Th and 60mh every other day Wed) given #7   -need daily weights 9.  Type 2 diabetes mellitus.  Hemoglobin A1c 5.2.  SSI   11.  Hypothyroidism.  Synthroid  12.  Hypertension.  Continue Toprol -XL 75 mg daily, Lasix  40 mg every 48 hours alternating with 60 mg every 48 hours  -5/20 bp well controlled 13.  Constipation.  Home regimen of Colace 100 mg twice daily, Metamucil 1 packet twice daily. 14.  UGI bleed/acute on chronic anemia:  Recent admission 09/22/2023 followed by Dr. Alita Irwin.  Prior endoscopy and colonoscopy showed gastritis, H. pylori, diverticulosis and hemorrhoids.  -on FeSO4     - HGB 11.4> 8.8 >  8.1 > 7.4  -started PPI  -appreciate GI consult, capsule endo today  -transfuse 1u PRBC  -monitor counts serially  -spoke with both cards/neurology re: holding plavix  and asa in the short term  -I spoke with daughter-in-law regarding plan including plavix /asa  LOS: 3 days A FACE TO FACE EVALUATION WAS PERFORMED  Rawland Caddy 10/29/2023, 8:44 AM

## 2023-10-29 NOTE — H&P (Incomplete)
 Pt on NPO after midnight 10/29/23 except for sips of water  with medication. Pre op procedure pending.

## 2023-10-29 NOTE — Progress Notes (Signed)
 Pt on NPO after midnight 10/29/23 except for sips of water  with medication. Pre op procedure pending.

## 2023-10-30 DIAGNOSIS — K922 Gastrointestinal hemorrhage, unspecified: Secondary | ICD-10-CM

## 2023-10-30 DIAGNOSIS — I63511 Cerebral infarction due to unspecified occlusion or stenosis of right middle cerebral artery: Secondary | ICD-10-CM | POA: Diagnosis not present

## 2023-10-30 DIAGNOSIS — L8990 Pressure ulcer of unspecified site, unspecified stage: Secondary | ICD-10-CM | POA: Insufficient documentation

## 2023-10-30 DIAGNOSIS — Z7902 Long term (current) use of antithrombotics/antiplatelets: Secondary | ICD-10-CM

## 2023-10-30 LAB — TYPE AND SCREEN
ABO/RH(D): A POS
Antibody Screen: NEGATIVE
Unit division: 0

## 2023-10-30 LAB — BASIC METABOLIC PANEL WITH GFR
Anion gap: 11 (ref 5–15)
BUN: 18 mg/dL (ref 8–23)
CO2: 24 mmol/L (ref 22–32)
Calcium: 9.3 mg/dL (ref 8.9–10.3)
Chloride: 96 mmol/L — ABNORMAL LOW (ref 98–111)
Creatinine, Ser: 0.74 mg/dL (ref 0.44–1.00)
GFR, Estimated: >60 mL/min (ref >60)
Glucose, Bld: 93 mg/dL (ref 70–99)
Potassium: 4.6 mmol/L (ref 3.5–5.1)
Sodium: 131 mmol/L — ABNORMAL LOW (ref 135–145)

## 2023-10-30 LAB — BPAM RBC
Blood Product Expiration Date: 202506122359
ISSUE DATE / TIME: 202505231452
Unit Type and Rh: 6200

## 2023-10-30 LAB — CBC
HCT: 27.5 % — ABNORMAL LOW (ref 36.0–46.0)
Hemoglobin: 8.9 g/dL — ABNORMAL LOW (ref 12.0–15.0)
MCH: 28.3 pg (ref 26.0–34.0)
MCHC: 32.4 g/dL (ref 30.0–36.0)
MCV: 87.6 fL (ref 80.0–100.0)
Platelets: 269 10*3/uL (ref 150–400)
RBC: 3.14 MIL/uL — ABNORMAL LOW (ref 3.87–5.11)
RDW: 15.8 % — ABNORMAL HIGH (ref 11.5–15.5)
WBC: 5.6 10*3/uL (ref 4.0–10.5)
nRBC: 0 % (ref 0.0–0.2)

## 2023-10-30 NOTE — Progress Notes (Signed)
 PROGRESS NOTE   Subjective/Complaints: In good spirits 2 of her granddaughters are visiting Hgb improved No new complaints  ROS: Patient denies fever, rash, sore throat, blurred vision, dizziness, nausea, vomiting, diarrhea, cough, shortness of breath or chest pain, joint or back/neck pain, headache, or mood change.    Objective:   No results found. Recent Labs    10/29/23 0611 10/30/23 0737  WBC 5.4 5.6  HGB 7.4* 8.9*  HCT 22.9* 27.5*  PLT 267 269   Recent Labs    10/29/23 1430 10/30/23 0737  NA 127* 131*  K 3.2* 4.6  CL 94* 96*  CO2 24 24  GLUCOSE 81 93  BUN 18 18  CREATININE 0.75 0.74  CALCIUM 8.5* 9.3    Intake/Output Summary (Last 24 hours) at 10/30/2023 1457 Last data filed at 10/30/2023 1328 Gross per 24 hour  Intake 802 ml  Output --  Net 802 ml     Pressure Injury 10/26/23 Coccyx Mid Stage 1 -  Intact skin with non-blanchable redness of a localized area usually over a bony prominence. (Active)  10/26/23 1220  Location: Coccyx  Location Orientation: Mid  Staging: Stage 1 -  Intact skin with non-blanchable redness of a localized area usually over a bony prominence.  Wound Description (Comments):   Present on Admission: Yes    Physical Exam: Vital Signs Blood pressure (!) 139/51, pulse 76, temperature (!) 97.4 F (36.3 C), temperature source Oral, resp. rate 16, height 5\' 2"  (1.575 m), weight 38.1 kg, SpO2 100%.  Constitutional: No distress . Vital signs reviewed. HEENT: NCAT, EOMI, oral membranes moist Neck: supple Cardiovascular: RRR with murmur. No JVD    Respiratory/Chest: CTA Bilaterally without wheezes or rales. Normal effort    GI/Abdomen: BS +, non-tender, non-distended Ext: no clubbing, cyanosis, or edema Psych: pleasant and cooperative, bright and cheerful Skin: Clean and intact without signs of breakdown. A few bruises Neuro:  Alert and oriented to person, place month/year, why  she's here. HOH. Mild left central VII. Speech sl dysarthric. MMT: RUE and RLE 4+ to 5/5 throughout. LUE 4 to 4+/5. LLE 4+ to 5-/5.Sensory exam normal for light touch and pain in all 4 limbs. No limb ataxia or cerebellar signs. No abnormal tone appreciated.  Prior neuro assessment is c/w today's exam 10/30/2023.  Musculoskeletal: Full ROM, No pain with AROM or PROM in the neck, trunk, or extremities. Posture appropriate    Assessment/Plan: 1. Functional deficits which require 3+ hours per day of interdisciplinary therapy in a comprehensive inpatient rehab setting. Physiatrist is providing close team supervision and 24 hour management of active medical problems listed below. Physiatrist and rehab team continue to assess barriers to discharge/monitor patient progress toward functional and medical goals  Care Tool:  Bathing    Body parts bathed by patient: Right arm, Left arm, Chest, Abdomen, Front perineal area, Buttocks, Right upper leg, Left upper leg, Right lower leg, Left lower leg, Face         Bathing assist Assist Level: Supervision/Verbal cueing     Upper Body Dressing/Undressing Upper body dressing   What is the patient wearing?: Bra, Pull over shirt    Upper body assist Assist Level:  Independent    Lower Body Dressing/Undressing Lower body dressing      What is the patient wearing?: Underwear/pull up, Pants     Lower body assist Assist for lower body dressing: Supervision/Verbal cueing     Toileting Toileting    Toileting assist Assist for toileting: Supervision/Verbal cueing     Transfers Chair/bed transfer  Transfers assist     Chair/bed transfer assist level: Supervision/Verbal cueing     Locomotion Ambulation   Ambulation assist      Assist level: Contact Guard/Touching assist Assistive device: Rollator Max distance: 220 ft   Walk 10 feet activity   Assist     Assist level: Supervision/Verbal cueing Assistive device: Rollator   Walk  50 feet activity   Assist    Assist level: Contact Guard/Touching assist Assistive device: Rollator    Walk 150 feet activity   Assist    Assist level: Contact Guard/Touching assist Assistive device: Rollator    Walk 10 feet on uneven surface  activity   Assist     Assist level: Contact Guard/Touching assist Assistive device: Rollator   Wheelchair     Assist Is the patient using a wheelchair?: No   Wheelchair activity did not occur: N/A         Wheelchair 50 feet with 2 turns activity    Assist    Wheelchair 50 feet with 2 turns activity did not occur: N/A       Wheelchair 150 feet activity     Assist  Wheelchair 150 feet activity did not occur: N/A       Blood pressure (!) 139/51, pulse 76, temperature (!) 97.4 F (36.3 C), temperature source Oral, resp. rate 16, height 5\' 2"  (1.575 m), weight 38.1 kg, SpO2 100%.  Medical Problem List and Plan: 1. Functional deficits secondary to right MCA territory infarction involving the right insula and overlying frontal parietal lobes/occlusion of distal M2 branch of the right MCA             -patient may  shower             -ELOS/Goals:  10-14 days -Continue CIR therapies including PT, OT as tolerated today 2.  Antithrombotics: -DVT/anticoagulation:  Mechanical: Antiembolism stockings, thigh (TED hose) Bilateral lower extremities             -antiplatelet therapy:  -5/23- spoke with neurology. Will hold asa and plavix  for now pending GI findings and course. May only tolerate asa alone. (Previous plan: Aspirin  81 mg daily and Plavix  75 mg day x 90 days then aspirin  alone) 3. Pain Management: Tylenol  as needed 4. Mood/Behavior/Sleep: Xanax  0.125-0.25 mg daily as needed, melatonin 3 mg nightly as needed             -antipsychotic agents: N/A 5. Neuropsych/cognition: This patient is capable of making decisions on her own behalf. 6. Skin/Wound Care: Routine skin checks 7. Fluids/Electrolytes/Nutrition:  continue to encourage po intake  -5/23 further drop in sodium (128) and potassium (2.8) today     -holding lasix  should help with both   -increase kdur to 40meq bid   -recheck bmet this afternoon  8.  Atrial fibrillation/Cards.  Followed by cardiology service Dr. Londa Rival.  Anticoagulation recently discontinued due to GI bleed.  Toprol -XL 50 mg daily.   -Currently heart rate controlled -5/23 -holding lasix  (40mg  every other day Th and 60mh every other day Wed) given #7   -need daily weights 9.  Type 2 diabetes mellitus.  Hemoglobin A1c 5.2.  SSI   11.  Hypothyroidism:  Continue Synthroid   12.  Hypertension.  continue-XL 75 mg daily, Lasix  40 mg every 48 hours alternating with 60 mg every 48 hours   13.  Constipation: continue Colace 100 mg twice daily, Metamucil 1 packet twice daily.  14.  UGI bleed/acute on chronic anemia:  Recent admission 09/22/2023 followed by Dr. Alita Irwin.  Prior endoscopy and colonoscopy showed gastritis, H. pylori, diverticulosis and hemorrhoids.  -on FeSO4     - HGB 11.4> 8.8 >  8.1 > 7.4  -started PPI  -appreciate GI consult, capsule endo today  -transfuse 1u PRBC  -monitor counts serially  -spoke with both cards/neurology re: holding plavix  and asa in the short term  -I spoke with daughter-in-law regarding plan including plavix /asa  Hgb reviewed and improved  LOS: 4 days A FACE TO FACE EVALUATION WAS PERFORMED  Lavell Portugal P Jessee Mezera 10/30/2023, 2:57 PM

## 2023-10-30 NOTE — Plan of Care (Signed)
  Problem: RH SKIN INTEGRITY Goal: RH STG MAINTAIN SKIN INTEGRITY WITH ASSISTANCE Description: STG Maintain Skin Integrity With Min Assistance. Outcome: Progressing   Problem: RH SAFETY Goal: RH STG ADHERE TO SAFETY PRECAUTIONS W/ASSISTANCE/DEVICE Description: STG Adhere to Safety Precautions With Min Assistance/Device. Outcome: Progressing   Problem: RH KNOWLEDGE DEFICIT Goal: RH STG INCREASE KNOWLEDGE OF STROKE PROPHYLAXIS Description: Patient will demonstrate knowledge of medications to assist with stroke prevention with educational materials and handouts provided by staff. Outcome: Progressing

## 2023-10-30 NOTE — Plan of Care (Signed)
  Problem: RH Stairs Goal: LTG Patient will ambulate up and down stairs w/assist (PT) Description: LTG: Patient will ambulate up and down # of stairs with assistance (PT) Flowsheets (Taken 10/30/2023 1300) LTG: Pt will ambulate up/down stairs assist needed:: (downgraded for safety) Supervision/Verbal cueing Note: downgraded for safety

## 2023-10-30 NOTE — Plan of Care (Signed)
  Problem: RH Tub/Shower Transfers Goal: LTG Patient will perform tub/shower transfers w/assist (OT) Description: LTG: Patient will perform tub/shower transfers with assist, with/without cues using equipment (OT) Flowsheets (Taken 10/30/2023 1603) LTG: Pt will perform tub/shower stall transfers with assistance level of: (LTG changed from Mod I to supervision as pt has to step over a tub wall versus a walk in shower.) Supervision/Verbal cueing LTG: Pt will perform tub/shower transfers from: Tub/shower combination Note: LTG changed from Mod I to supervision as pt has to step over a tub wall versus a walk in shower.

## 2023-10-30 NOTE — H&P (View-Only) (Signed)
 Patient ID: JACOYA BAUMAN, female   DOB: July 30, 1934, 89 y.o.   MRN: [id number]    Progress Note   Subjective   Day # 5 CC; drop in hemoglobin while on aspirin  and Plavix  dark heme positive stool  ASA and Plavix  currently on hold since yesterday Hemoglobin 7.4 yesterday received 1 unit of packed RBCs hemoglobin up to 8.9 today  Capsule endoscopy-complete study Few streaks of heme in the stomach As active bleeding within 1 minute beyond the first duodenal image, unable to visualize culprit lesion No other areas of active bleeding, melenic stool and more distal small bowel and colon  Patient says she feels fine today, was able to participate in activities, no complaints of lightheadedness or dizziness, 1 dark stool this morning     Objective   Vital signs in last 24 hours: Temp:  [97.4 F (36.3 C)-98.3 F (36.8 C)] 97.4 F (36.3 C) (05/24 1255) Pulse Rate:  [76-82] 76 (05/24 1255) Resp:  [16-18] 16 (05/24 1255) BP: (120-139)/(51-72) 139/51 (05/24 1255) SpO2:  [99 %-100 %] 100 % (05/24 1255) Weight:  [38.1 kg] 38.1 kg (05/24 0523) Last BM Date : 10/29/23 General:   Elderly white female in NAD hard of hearing very pleasant Heart:  Regular rate and rhythm; no murmurs Lungs: Respirations even and unlabored, lungs CTA bilaterally Abdomen:  Soft, nontender and nondistended. Normal bowel sounds. Extremities:  Without edema. Neurologic:  Alert and oriented,  grossly normal neurologically. Psych:  Cooperative. Normal mood and affect.  Intake/Output from previous day: 05/23 0701 - 05/24 0700 In: 562 [P.O.:240; Blood:322] Out: -  Intake/Output this shift: Total I/O In: 600 [P.O.:600] Out: -   Lab Results: Recent Labs    10/28/23 0602 10/29/23 0611 10/30/23 0737  WBC 4.6 5.4 5.6  HGB 8.1* 7.4* 8.9*  HCT 25.2* 22.9* 27.5*  PLT 276 267 269   BMET Recent Labs    10/29/23 0611 10/29/23 1430 10/30/23 0737  NA 128* 127* 131*  K 2.8* 3.2* 4.6  CL 94* 94* 96*  CO2 26  24 24   GLUCOSE 92 81 93  BUN 18 18 18   CREATININE 0.87 0.75 0.74  CALCIUM 7.9* 8.5* 9.3   LFT No results for input(s): "PROT", "ALBUMIN ", "AST", "ALT", "ALKPHOS", "BILITOT", "BILIDIR", "IBILI" in the last 72 hours. PT/INR No results for input(s): "LABPROT", "INR" in the last 72 hours.       Assessment / Plan:    #10 89 year old female admitted with acute right CVA 10/21/2023 and initiated on aspirin  and Plavix  Transferred to rehab and progressing well  #2 history of iron  deficiency anemia, recurrent episodes of anemia requiring transfusion.  Had hemoglobin as low as 5.9 during an admission in April at [facility], required transfusions and documented heme positive.  Was also managed for acute respiratory failure during that admission Prior GI evaluation with EGD and colonoscopy October 2024 done for iron  deficiency anemia with finding of mild gastritis, H. pylori positive and treated in a colonoscopy had internal and external hemorrhoids with external hemorrhoids felt to be the source of her bleeding Further GI workup per surgery during admission in April 2025  Patient had a 3-1/2 g drop in hemoglobin while on rehab, prior transfusion x 1 yesterday  Aspirin  and Plavix  are now on hold  Capsule endoscopy reveals a bleeding source in the very proximal small bowel suspect AVM but unable to visualize the culprit lesion at capsule endoscopy.  There are also few streaks of heme in her stomach  #3  history of atrial fibrillation permanent #4 chronic diastolic heart failure-echo this admission with right ventricle severely enlarged right ventricular systolic function moderately reduced severely elevated pulmonary artery systolic pressure #5 hypothyroidism  Plan; n.p.o. after midnight Continue to hold aspirin  and Plavix  Need to trend hemoglobin and transfuse as indicated, will repeat in a.m. Patient will be scheduled for enteroscopy with Dr. [name] tomorrow 10/31/2023.  Procedure was  discussed in detail with the patient including indications risks and benefits and she is agreeable to proceed.  Certainly okay to resume aspirin  postprocedure, and may be okay to resume Plavix  on discharge pending findings at enteroscopy tomorrow  I spoke with the patient's son, [name] by phone at patient request to review capsule endoscopy findings and plans for enteroscopy.  He is in agreement with proceeding.   Principal Problem:   Right middle cerebral artery stroke (HCC)     LOS: 4 days   Yianna Tersigni EsterwoodPA-C  10/30/2023, 4:58 PM

## 2023-10-30 NOTE — Progress Notes (Shared)
 Physical Therapy Discharge Summary  Patient Details  Name: Shelly Fields MRN: [id number] Date of Birth: March 12, 1935  Date of Discharge from PT service:Nov 02, 2023  Patient has met {NUMBERS 0-12:18577} of {NUMBERS 0-12:18577} long term goals due to {due IO:9629528}.  Patient to discharge at [facility] level {LOA:3049010}.   Patient's care partner {care partner:3041650} to provide the necessary {assistance:3041652} assistance at discharge.  Reasons goals not met: ***  Recommendation:  Patient will benefit from ongoing skilled PT services in {setting:3041680} to continue to advance safe functional mobility, address ongoing impairments in ***, and minimize fall risk.  Equipment: {equipment:3041657}  Reasons for discharge: {Reason for discharge:3049018}  Patient/family agrees with progress made and goals achieved: {Pt/Family agree with progress/goals:3049020}  PT Discharge Precautions/Restrictions Precautions Precautions: Fall Precaution/Restrictions Comments: mild L hemipareisis Restrictions Weight Bearing Restrictions Per Provider Order: No Pain  *** Pain Interference  *** Vision/Perception    *** Cognition Overall Cognitive Status: Within Functional Limits for tasks assessed Arousal/Alertness: Awake/alert Orientation Level: Oriented X4 Attention: Sustained Focused Attention: Appears intact Sustained Attention: Appears intact Memory: Appears intact Awareness: Appears intact Problem Solving: Appears intact Reasoning: Appears intact Safety/Judgment: Appears intact Sensation Sensation Light Touch: Appears Intact Hot/Cold: Appears Intact Proprioception: Appears Intact Coordination Gross Motor Movements are Fluid and Coordinated: Yes Motor  Motor Motor:  (mild L hemiparesis) Motor - Discharge Observations: General weakenss: LLE>RLE  Mobility Bed Mobility Bed Mobility: Supine to Sit;Sit to Supine Supine to Sit: Independent Sit to Supine:  Independent Transfers Transfers: Sit to Stand;Stand to Sit;Stand Pivot Transfers Sit to Stand: Independent Stand to Sit: Independent Stand Pivot Transfers: Independent with assistive device Transfer (Assistive device): Rollator Locomotion  Gait Ambulation: Yes Gait Assistance: Supervision/Verbal cueing Gait Distance (Feet): 500 Feet Assistive device: Rollator Gait Gait: Yes Gait Pattern: Impaired Gait Pattern: Step-through pattern;Poor foot clearance - left (greater deficit on non-compliant surfaces) Stairs / Additional Locomotion Stairs: Yes Stairs Assistance: Supervision/Verbal cueing Stair Management Technique: Two rails;Step to pattern Number of Stairs: 12 Height of Stairs: 6 Curb: Independent with assistive device Pick up small object from the floor assist level: Independent with assistive device Pick up small object from the floor assistive device: rollator Wheelchair Mobility Wheelchair Mobility: No  Trunk/Postural Assessment  Cervical Assessment Cervical Assessment: Within Functional Limits Thoracic Assessment Thoracic Assessment: Within Functional Limits Lumbar Assessment Lumbar Assessment: Exceptions to [facility] (Posterior tilt in standing) Postural Control Postural Control: Within Functional Limits  Balance Balance Balance Assessed: Yes Static Sitting Balance Static Sitting - Balance Support: No upper extremity supported;Feet supported Static Sitting - Level of Assistance: 7: Independent Dynamic Sitting Balance Dynamic Sitting - Balance Support: No upper extremity supported;Feet supported Dynamic Sitting - Level of Assistance: 7: Independent Static Standing Balance Static Standing - Balance Support: Bilateral upper extremity supported Static Standing - Level of Assistance: 6: Modified independent (Device/Increase time) Dynamic Standing Balance Dynamic Standing - Balance Support: During functional activity;Left upper extremity supported Dynamic Standing - Level  of Assistance: 6: Modified independent (Device/Increase time) Dynamic Standing - Balance Activities: Reaching across midline Extremity Assessment       ***  ***   Keymon Mcelroy PTA  10/30/2023, 3:38 PM

## 2023-10-30 NOTE — Progress Notes (Addendum)
 Patient ID: Shelly Fields, female   DOB: July 30, 1934, 89 y.o.   MRN: [id number]    Progress Note   Subjective   Day # 5 CC; drop in hemoglobin while on aspirin  and Plavix  dark heme positive stool  ASA and Plavix  currently on hold since yesterday Hemoglobin 7.4 yesterday received 1 unit of packed RBCs hemoglobin up to 8.9 today  Capsule endoscopy-complete study Few streaks of heme in the stomach As active bleeding within 1 minute beyond the first duodenal image, unable to visualize culprit lesion No other areas of active bleeding, melenic stool and more distal small bowel and colon  Patient says she feels fine today, was able to participate in activities, no complaints of lightheadedness or dizziness, 1 dark stool this morning     Objective   Vital signs in last 24 hours: Temp:  [97.4 F (36.3 C)-98.3 F (36.8 C)] 97.4 F (36.3 C) (05/24 1255) Pulse Rate:  [76-82] 76 (05/24 1255) Resp:  [16-18] 16 (05/24 1255) BP: (120-139)/(51-72) 139/51 (05/24 1255) SpO2:  [99 %-100 %] 100 % (05/24 1255) Weight:  [38.1 kg] 38.1 kg (05/24 0523) Last BM Date : 10/29/23 General:   Elderly white female in NAD hard of hearing very pleasant Heart:  Regular rate and rhythm; no murmurs Lungs: Respirations even and unlabored, lungs CTA bilaterally Abdomen:  Soft, nontender and nondistended. Normal bowel sounds. Extremities:  Without edema. Neurologic:  Alert and oriented,  grossly normal neurologically. Psych:  Cooperative. Normal mood and affect.  Intake/Output from previous day: 05/23 0701 - 05/24 0700 In: 562 [P.O.:240; Blood:322] Out: -  Intake/Output this shift: Total I/O In: 600 [P.O.:600] Out: -   Lab Results: Recent Labs    10/28/23 0602 10/29/23 0611 10/30/23 0737  WBC 4.6 5.4 5.6  HGB 8.1* 7.4* 8.9*  HCT 25.2* 22.9* 27.5*  PLT 276 267 269   BMET Recent Labs    10/29/23 0611 10/29/23 1430 10/30/23 0737  NA 128* 127* 131*  K 2.8* 3.2* 4.6  CL 94* 94* 96*  CO2 26  24 24   GLUCOSE 92 81 93  BUN 18 18 18   CREATININE 0.87 0.75 0.74  CALCIUM 7.9* 8.5* 9.3   LFT No results for input(s): "PROT", "ALBUMIN ", "AST", "ALT", "ALKPHOS", "BILITOT", "BILIDIR", "IBILI" in the last 72 hours. PT/INR No results for input(s): "LABPROT", "INR" in the last 72 hours.       Assessment / Plan:    #10 89 year old female admitted with acute right CVA 10/21/2023 and initiated on aspirin  and Plavix  Transferred to rehab and progressing well  #2 history of iron  deficiency anemia, recurrent episodes of anemia requiring transfusion.  Had hemoglobin as low as 5.9 during an admission in April at [facility], required transfusions and documented heme positive.  Was also managed for acute respiratory failure during that admission Prior GI evaluation with EGD and colonoscopy October 2024 done for iron  deficiency anemia with finding of mild gastritis, H. pylori positive and treated in a colonoscopy had internal and external hemorrhoids with external hemorrhoids felt to be the source of her bleeding Further GI workup per surgery during admission in April 2025  Patient had a 3-1/2 g drop in hemoglobin while on rehab, prior transfusion x 1 yesterday  Aspirin  and Plavix  are now on hold  Capsule endoscopy reveals a bleeding source in the very proximal small bowel suspect AVM but unable to visualize the culprit lesion at capsule endoscopy.  There are also few streaks of heme in her stomach  #3  history of atrial fibrillation permanent #4 chronic diastolic heart failure-echo this admission with right ventricle severely enlarged right ventricular systolic function moderately reduced severely elevated pulmonary artery systolic pressure #5 hypothyroidism  Plan; n.p.o. after midnight Continue to hold aspirin  and Plavix  Need to trend hemoglobin and transfuse as indicated, will repeat in a.m. Patient will be scheduled for enteroscopy with Dr. [name] tomorrow 10/31/2023.  Procedure was  discussed in detail with the patient including indications risks and benefits and she is agreeable to proceed.  Certainly okay to resume aspirin  postprocedure, and may be okay to resume Plavix  on discharge pending findings at enteroscopy tomorrow  I spoke with the patient's son, [name] by phone at patient request to review capsule endoscopy findings and plans for enteroscopy.  He is in agreement with proceeding.   Principal Problem:   Right middle cerebral artery stroke (HCC)     LOS: 4 days   Shelly Tersigni EsterwoodPA-C  10/30/2023, 4:58 PM

## 2023-10-30 NOTE — Progress Notes (Signed)
 Occupational Therapy Session Note  Patient Details  Name: Shelly Fields MRN: [id number] Date of Birth: 06-11-1934  Today's Date: 10/30/2023 OT Individual Time: 7829-5621 and 1300-1345 OT Individual Time Calculation (min): 60 min and 45 min    Short Term Goals: Week 1:  OT Short Term Goal 1 (Week 1): LTG=STG overall mod I goals OT Short Term Goal 2 (Week 1): Pt will perform simple meal prep activity with AD with mod I  Skilled Therapeutic Interventions/Progress Updates:    Visit 1: Pain:  no c/o pain Pt received in bed and agreeable to a shower today. Covered IV with waterproof barrier.  She used RW to ambulate in room to gather her clothing, ambulate to bathroom, stp into shower and shower using seat all with supervision. After drying off, ambulated to bed to dress with no A needed.  Pt then stood at sink to dry and brush hair.  When standing using B hands above head and focusing on her hair she tended to lean to L with L knee flexion. Able to self correct with cues.  Pt can continue to work on LLE strength and stability to increase dynamic balance safety.  Pt resting in recliner at end of session with all needs met and alarm on.   Visit 2:  Pain:  no c/o pain   Pt received in recliner with 2 granddtrs present.  To demonstrate to her family how well she is moving, pt demonstrated sit to stands without A, ambulation with RW to gym,  how she performs squats standing in front of a firm seat (like a couch).  They were surprised at how much stronger she has become and progressed since her stroke.  To focus on L leg stability, had pt practice tap ups using a small step with L foot on bench and tapping R foot up and down.  Pt participated well. Discussed with family recommendations for close S with getting in and out of the tub and cooking the first week or 2 at home. Provided pt with walker bag.  Pt returned to room with all needs met. Alarm on.   Therapy Documentation Precautions:   Precautions Precautions: Fall Precaution/Restrictions Comments: mild L hemipareisis Restrictions Weight Bearing Restrictions Per Provider Order: No Therapy Vitals Temp: (!) 97.4 F (36.3 C) Temp Source: Oral Pulse Rate: 76 Resp: 16 BP: (!) 139/51 Patient Position (if appropriate): Sitting Oxygen Therapy SpO2: 100 % O2 Device: Room Air Pain: Pain Assessment Pain Scale: 0-10 Pain Score: 0-No pain ADL: ADL Eating: Independent Grooming: Supervision/safety Where Assessed-Grooming: Standing at sink Upper Body Bathing: Supervision/safety Where Assessed-Upper Body Bathing: Shower Lower Body Bathing: Supervision/safety Where Assessed-Lower Body Bathing: Shower, Chair Upper Body Dressing: Independent Where Assessed-Upper Body Dressing: Edge of bed Lower Body Dressing: Supervision/safety Where Assessed-Lower Body Dressing: Edge of bed Toileting: Setup Where Assessed-Toileting: Teacher, adult education: Close supervision Toilet Transfer Method: Proofreader: Chiropractor Transfer: Close supervison Web designer Method: Ship broker: Acupuncturist: Close supervision Film/video editor Method: Designer, industrial/product: Grab bars, Shower seat with back   Therapy/Group: Individual Therapy  Christoph Copelan 10/30/2023, 12:59 PM

## 2023-10-30 NOTE — Progress Notes (Signed)
 Physical Therapy Session Note  Patient Details  Name: Shelly Fields MRN: [id number] Date of Birth: 05/19/1935  Today's Date: 10/30/2023 PT Individual Time: 1122-1203; 1425 - 1505 PT Individual Time Calculation (min): 41 min; 40 min   Short Term Goals: Week 1:  PT Short Term Goal 1 (Week 1): STG = LTG d/t ELOS  SESSION 1 Skilled Therapeutic Interventions/Progress Updates: Patient sitting in recliner with family present on entrance to room. Patient alert and agreeable to PT session.   Patient reported no pain. Session focused on pt/family education with floor transfers and stairs. Pt ambulated from room<>main gym (150'+) using rollator (personal - height adjusted to fit pt with education provided to pt and family). Pt navigated 4 (6") stairs with BHR and with modI ascending, but supervision descending due to slight posterior weight bias  and L LE adducting to midline (family and pt reported that pt will not have to navigate stairs at home initially, and can work on stairs with HHPT after d/c - only step is curb step with pt already performing supervision/modI with RW previous sessions, and will work on a few more times prior to d/c). Pt participated in floor transfer from floor mat to hi/low mat with VC for sequence, to assess self for bodily injuries, and to crawl to stabile furniture to assist with standing (pt crawled in quadruped on mat safely to demo). Pt performed overall with modI after mulitiple rounds with pt demonstrating anticipatory/emergent awareness by asking questions for future safety, and problem solving to get to quadruped. Pt family encouraged to assess pt's home environment to ensure there are either bars placed, or stabile furniture pt can have easy access to for safety. Pt family also encouraged to look into some sort of device (ex. Watch that detects when pt falls and will call emergency contact if no cancellation occurs). Pt and pt family feel safer/confident in pt's d/c home  next week.  Patient sitting in recliner at end of session with brakes locked, family present (cleared for transfers in room) and all needs within reach.  SESSION 2 Skilled Therapeutic Interventions/Progress Updates: Patient sitting in recliner with family present on entrance to room. Patient alert and agreeable to PT session.   Patient reported no pain. Session focused on community ambulation from room<>outside of WCC to navigate non-compliant surfaces and busy environment with rollator (500'+). Pt demonstrated observance of energy conservation by sitting briefly when needing to, safe control of cadence when ascending/descending sidewalk, safely stepping over uneven cracks on floor, and locking brakes when statically standing to talk for standing rest breaks. Pt also navigated ambulating on grass with rollator to assess safety of doing so at home with personal rollator. PTA informed family that pt will need to have off-road rollator that can safely tread through terrain at home as pt will eventually want to go out into yard (with HHPT). Pt also demonstrated picking up sticks in grass with locking brakes on rollator (reported doing so previously at home) with PTA encouraging doing so first with HHPT to assess safety of terrain. Pt asked why she would need HHPT if she knows what to do with PTA encouraging pt to participate in further rehabilitating overall balance, stairs and non-compliant surfaces at home with pt agreeing (family supported). Pt family pleased to see pt's progress since admission to rehab with no further questions at this time. Family ed still scheduled for 5/26 to finalize any concerns/questions/DME needs.  Patient sitting in rollator at end of session with brakes locked,  family present, belt alarm set, and all needs within reach.       Therapy Documentation Precautions:  Precautions Precautions: Fall Precaution/Restrictions Comments: mild L hemipareisis Restrictions Weight  Bearing Restrictions Per Provider Order: No  Therapy/Group: Individual Therapy  Jailynne Opperman PTA 10/30/2023, 12:26 PM

## 2023-10-31 ENCOUNTER — Encounter (HOSPITAL_COMMUNITY)
Admission: AD | Disposition: A | Discharge status: Home Health | Payer: Self-pay | Source: Other Acute Inpatient Hospital | Attending: Physical Medicine & Rehabilitation

## 2023-10-31 ENCOUNTER — Inpatient Hospital Stay (HOSPITAL_COMMUNITY): Admitting: Certified Registered Nurse Anesthetist

## 2023-10-31 ENCOUNTER — Encounter (HOSPITAL_COMMUNITY): Payer: Self-pay | Admitting: Gastroenterology

## 2023-10-31 DIAGNOSIS — T18198A Other foreign object in esophagus causing other injury, initial encounter: Secondary | ICD-10-CM

## 2023-10-31 DIAGNOSIS — I110 Hypertensive heart disease with heart failure: Secondary | ICD-10-CM

## 2023-10-31 DIAGNOSIS — K31811 Angiodysplasia of stomach and duodenum with bleeding: Secondary | ICD-10-CM

## 2023-10-31 DIAGNOSIS — I509 Heart failure, unspecified: Secondary | ICD-10-CM

## 2023-10-31 DIAGNOSIS — K449 Diaphragmatic hernia without obstruction or gangrene: Secondary | ICD-10-CM

## 2023-10-31 DIAGNOSIS — K222 Esophageal obstruction: Secondary | ICD-10-CM

## 2023-10-31 HISTORY — PX: ENTEROSCOPY: SHX5533

## 2023-10-31 HISTORY — PX: HOT HEMOSTASIS: SHX5433

## 2023-10-31 LAB — HEMOGLOBIN AND HEMATOCRIT, BLOOD
HCT: 27.7 % — ABNORMAL LOW (ref 36.0–46.0)
Hemoglobin: 8.6 g/dL — ABNORMAL LOW (ref 12.0–15.0)

## 2023-10-31 SURGERY — ENTEROSCOPY
Anesthesia: Monitor Anesthesia Care

## 2023-10-31 MED ORDER — SODIUM CHLORIDE 0.9 % IV SOLN: INTRAVENOUS | Status: DC | PRN

## 2023-10-31 MED ORDER — PROPOFOL 500 MG/50ML IV EMUL
INTRAVENOUS | Status: DC | PRN
  Administered 2023-10-31: 180 ug/kg/min via INTRAVENOUS

## 2023-10-31 MED ORDER — LIDOCAINE 2% (20 MG/ML) 5 ML SYRINGE
INTRAMUSCULAR | Status: DC | PRN
  Administered 2023-10-31: 40 mg via INTRAVENOUS

## 2023-10-31 MED ORDER — PROPOFOL 10 MG/ML IV BOLUS
INTRAVENOUS | Status: DC | PRN
  Administered 2023-10-31: 20 mg via INTRAVENOUS

## 2023-10-31 NOTE — Anesthesia Preprocedure Evaluation (Addendum)
 Anesthesia Evaluation  Patient identified by MRN, date of birth, ID band Patient awake    Reviewed: Allergy  & Precautions, NPO status , Patient's Chart, lab work & pertinent test results, reviewed documented beta blocker date and time   Airway Mallampati: II  TM Distance: >3 FB Neck ROM: Full    Dental  (+) Dental Advisory Given, Partial Upper, Partial Lower   Pulmonary neg pulmonary ROS   Pulmonary exam normal breath sounds clear to auscultation       Cardiovascular hypertension, Pt. on home beta blockers pulmonary hypertension+CHF  + dysrhythmias Atrial Fibrillation  Rhythm:Irregular Rate:Abnormal  Echo 10/22/23: 1. There is mild left ventricular hypertrophy. Left ventricular diastolic  parameters are indeterminate. There is the interventricular septum is  flattened in systole and diastole, consistent with right ventricular  pressure and volume overload.   2. Right ventricular systolic function is moderately reduced. The right  ventricular size is severely enlarged. There is severely elevated  pulmonary artery systolic pressure. The estimated right ventricular  systolic pressure is 42.2 mmHg.   3. Left atrial size was severely dilated.   4. Right atrial size was severely dilated.   5. The mitral valve is abnormal. Mild mitral valve regurgitation. No  evidence of mitral stenosis.   6. The tricuspid valve is abnormal. Tricuspid valve regurgitation is  severe.   7. The aortic valve is tricuspid. Aortic valve regurgitation is mild. No  aortic stenosis is present.   8. The inferior vena cava is dilated in size with <50% respiratory  variability, suggesting right atrial pressure of 15 mmHg.   9. Limited echo in setting of stroke     Neuro/Psych  PSYCHIATRIC DISORDERS Anxiety     CVA    GI/Hepatic negative GI ROS, Neg liver ROS,,,  Endo/Other  diabetes, Type 2, Oral Hypoglycemic AgentsHypothyroidism    Renal/GU negative  Renal ROS     Musculoskeletal negative musculoskeletal ROS (+)    Abdominal   Peds  Hematology  (+) Blood dyscrasia (Plavix ), anemia   Anesthesia Other Findings Day of surgery medications reviewed with the patient.  Reproductive/Obstetrics                             Anesthesia Physical Anesthesia Plan  ASA: 4  Anesthesia Plan: MAC   Post-op Pain Management: Minimal or no pain anticipated   Induction: Intravenous  PONV Risk Score and Plan: 2 and TIVA and Treatment may vary due to age or medical condition  Airway Management Planned: Simple Face Mask and Natural Airway  Additional Equipment:   Intra-op Plan:   Post-operative Plan:   Informed Consent: I have reviewed the patients History and Physical, chart, labs and discussed the procedure including the risks, benefits and alternatives for the proposed anesthesia with the patient or authorized representative who has indicated his/her understanding and acceptance.   Patient has DNR.  Discussed DNR with patient and Suspend DNR.   Dental advisory given  Plan Discussed with: CRNA  Anesthesia Plan Comments:         Anesthesia Quick Evaluation

## 2023-10-31 NOTE — Anesthesia Postprocedure Evaluation (Signed)
 Anesthesia Post Note  Patient: TRINNITY BREUNIG  Procedure(s) Performed: ENTEROSCOPY EGD, WITH ARGON PLASMA COAGULATION     Patient location during evaluation: PACU Anesthesia Type: MAC Level of consciousness: awake and alert Pain management: pain level controlled Vital Signs Assessment: post-procedure vital signs reviewed and stable Respiratory status: spontaneous breathing, nonlabored ventilation and respiratory function stable Cardiovascular status: stable and blood pressure returned to baseline Postop Assessment: no apparent nausea or vomiting Anesthetic complications: no   No notable events documented.  Last Vitals:  Vitals:   10/31/23 1300 10/31/23 1325  BP: 120/67 136/69  Pulse: 70 71  Resp: (!) 21 20  Temp:  36.5 C  SpO2: 99% 100%    Last Pain:  Vitals:   10/31/23 1300  TempSrc:   PainSc: 0-No pain                 Erin Havers

## 2023-10-31 NOTE — Op Note (Signed)
 [facility] Patient Name: Shelly Fields Procedure Date : 10/31/2023 MRN: [id number] Attending MD: Jaye Mettle. [name] , MD, [id number] Date of Birth: 06/16/1934 CSN: [id number] Age: 89 Admit Type: Inpatient Procedure:                Small bowel enteroscopy Indications:              Abnormal video capsule endoscopy (active bleeding),                            Suspected arteriovenous malformation in the small                            intestine Providers:                Haynes Giannotti E. [name], MD, Ila Malay, RN, Judith Novak, Technician Referring MD:              Medicines:                Monitored Anesthesia Care Complications:            No immediate complications. Estimated Blood Loss:     Estimated blood loss was minimal. Procedure:                Pre-Anesthesia Assessment:                           - Prior to the procedure, a History and Physical                            was performed, and patient medications and                            allergies were reviewed. The patient's tolerance of                            previous anesthesia was also reviewed. The risks                            and benefits of the procedure and the sedation                            options and risks were discussed with the patient.                            All questions were answered, and informed consent                            was obtained. Prior Anticoagulants: The patient has                            taken Plavix  (clopidogrel ), last dose was 4 days  prior to procedure. ASA Grade Assessment: III - A                            patient with severe systemic disease. After                            reviewing the risks and benefits, the patient was                            deemed in satisfactory condition to undergo the                            procedure.                           After obtaining informed consent, the  endoscope was                            passed under direct vision. Throughout the                            procedure, the patient's blood pressure, pulse, and                            oxygen saturations were monitored continuously. The                            PCF-H190TL ([id number] Olympus slim colonoscope was                            introduced through the mouth and advanced to the                            jejunum, to the 120 cm mark (from the incisors).                            The small bowel enteroscopy was accomplished                            without difficulty. The patient tolerated the                            procedure well. Scope In: Scope Out: Findings:      A pill was found in the middle third of the esophagus. This was advanced       into the gastric lumen using the scope tip.      A mild Schatzki ring was found at the gastroesophageal junction.      The exam of the esophagus was otherwise normal.      A 4 cm hiatal hernia was present.      The exam of the stomach was otherwise normal.      A single angioectasia with no bleeding was found in the second portion       of the duodenum. Coagulation for hemostasis using argon plasma was  successful. Estimated blood loss was minimal.      The exam of the duodenum was otherwise normal.      Exam of the jejunum was otherwise normal. Impression:               - A pill was found in the esophagus.                           - Mild Schatzki ring.                           - 4 cm hiatal hernia.                           - A single non-bleeding angioectasia in the                            duodenum. This corresponds to the bleeding noted on                            VCE and is the etiology of her acute blood loss                            anemia and recurrent iron  deficiency anemia.                            Treated with argon plasma coagulation (APC). No                            other AVMs noted.                            - No specimens collected. Moderate Sedation:      N/A Recommendation:           - Return patient to hospital ward for ongoing care.                           - Resume regular diet.                           - Resume Plavix  (clopidogrel ) at prior dose in 2                            days (Tuesday).                           - Recommend continuing BID PPI for 4 weeks                           - GI will sign off at this time. Please reconsult                            if there is further concern for bleeding. Procedure Code(s):        --- Professional ---  [phone], Small intestinal endoscopy, enteroscopy                            beyond second portion of duodenum, not including                            ileum; with control of bleeding (eg, injection,                            bipolar cautery, unipolar cautery, laser, heater                            probe, stapler, plasma coagulator) Diagnosis Code(s):        --- Professional ---                           U04.540J, Other foreign object in esophagus causing                            other injury, initial encounter                           K22.2, Esophageal obstruction                           K44.9, Diaphragmatic hernia without obstruction or                            gangrene                           K31.819, Angiodysplasia of stomach and duodenum                            without bleeding                           R93.3, Abnormal findings on diagnostic imaging of                            other parts of digestive tract CPT copyright 2022 American [employer] Association. All rights reserved. The codes documented in this report are preliminary and upon coder review may  be revised to meet current compliance requirements. Ilario Dhaliwal E. [name], MD 10/31/2023 12:30:44 PM This report has been signed electronically. Number of Addenda: 0

## 2023-10-31 NOTE — Progress Notes (Signed)
 Attempted to see patient but she was off the floor for endoscopy

## 2023-10-31 NOTE — Interval H&P Note (Signed)
 History and Physical Interval Note:  10/31/2023 12:17 PM  Shelly Fields  has presented today for surgery, with the diagnosis of Melena, anemia, small bowel bleeding.  The various methods of treatment have been discussed with the patient and family. After consideration of risks, benefits and other options for treatment, the patient has consented to  Procedure(s): ENTEROSCOPY (N/A) EGD, WITH ARGON PLASMA COAGULATION (N/A) as a surgical intervention.  The patient's history has been reviewed, patient examined, no change in status, stable for surgery.  I have reviewed the patient's chart and labs.  Questions were answered to the patient's satisfaction.     Elois Hair

## 2023-10-31 NOTE — Transfer of Care (Signed)
 Immediate Anesthesia Transfer of Care Note  Patient: Shelly Fields  Procedure(s) Performed: ENTEROSCOPY EGD, WITH ARGON PLASMA COAGULATION  Patient Location: Endoscopy Unit  Anesthesia Type:MAC  Level of Consciousness: drowsy  Airway & Oxygen Therapy: Patient Spontanous Breathing and Patient connected to nasal cannula oxygen  Post-op Assessment: Report given to RN and Post -op Vital signs reviewed and stable  Post vital signs: Reviewed and stable  Last Vitals:  Vitals Value Taken Time  BP 90/64 10/31/23 1220  Temp    Pulse 81 10/31/23 1221  Resp 25 10/31/23 1221  SpO2 94 % 10/31/23 1221  Vitals shown include unfiled device data.  Last Pain:  Vitals:   10/31/23 1131  TempSrc: Temporal  PainSc: 0-No pain         Complications: No notable events documented.

## 2023-11-01 ENCOUNTER — Encounter (HOSPITAL_COMMUNITY): Payer: Self-pay | Admitting: Gastroenterology

## 2023-11-01 DIAGNOSIS — I482 Chronic atrial fibrillation, unspecified: Secondary | ICD-10-CM | POA: Diagnosis not present

## 2023-11-01 DIAGNOSIS — K2951 Unspecified chronic gastritis with bleeding: Secondary | ICD-10-CM | POA: Diagnosis not present

## 2023-11-01 DIAGNOSIS — I63511 Cerebral infarction due to unspecified occlusion or stenosis of right middle cerebral artery: Secondary | ICD-10-CM | POA: Diagnosis not present

## 2023-11-01 LAB — CBC
HCT: 29.5 % — ABNORMAL LOW (ref 36.0–46.0)
Hemoglobin: 9.1 g/dL — ABNORMAL LOW (ref 12.0–15.0)
MCH: 28.1 pg (ref 26.0–34.0)
MCHC: 30.8 g/dL (ref 30.0–36.0)
MCV: 91 fL (ref 80.0–100.0)
Platelets: 347 10*3/uL (ref 150–400)
RBC: 3.24 MIL/uL — ABNORMAL LOW (ref 3.87–5.11)
RDW: 16.2 % — ABNORMAL HIGH (ref 11.5–15.5)
WBC: 7.9 10*3/uL (ref 4.0–10.5)
nRBC: 0 % (ref 0.0–0.2)

## 2023-11-01 MED ORDER — FERROUS GLUCONATE 324 (38 FE) MG PO TABS
324.0000 mg | ORAL_TABLET | ORAL | 0 refills | Status: AC
  Filled 2023-11-01: qty 15, 30d supply, fill #0

## 2023-11-01 MED ORDER — CLOPIDOGREL BISULFATE 75 MG PO TABS
75.0000 mg | ORAL_TABLET | Freq: Every day | ORAL | Status: DC
Start: 2023-11-02 — End: 2023-11-04
  Administered 2023-11-02 – 2023-11-04 (×3): 75 mg via ORAL
  Filled 2023-11-01 (×3): qty 1

## 2023-11-01 MED ORDER — ASPIRIN 81 MG PO TBEC
81.0000 mg | DELAYED_RELEASE_TABLET | Freq: Every day | ORAL | Status: DC
  Administered 2023-11-02 – 2023-11-04 (×3): 81 mg via ORAL
  Filled 2023-11-01 (×3): qty 1

## 2023-11-01 MED ORDER — POTASSIUM CHLORIDE CRYS ER 20 MEQ PO TBCR
20.0000 meq | EXTENDED_RELEASE_TABLET | Freq: Every day | ORAL | 0 refills | Status: DC
  Filled 2023-11-01: qty 30, 30d supply, fill #0

## 2023-11-01 MED ORDER — POTASSIUM CHLORIDE CRYS ER 20 MEQ PO TBCR
20.0000 meq | EXTENDED_RELEASE_TABLET | Freq: Every day | ORAL | Status: DC
  Administered 2023-11-02 – 2023-11-04 (×3): 20 meq via ORAL
  Filled 2023-11-01 (×3): qty 1

## 2023-11-01 MED ORDER — MELATONIN 3 MG PO TABS
3.0000 mg | ORAL_TABLET | Freq: Every evening | ORAL | 0 refills | Status: AC | PRN
  Filled 2023-11-01: qty 30, 30d supply, fill #0

## 2023-11-01 MED ORDER — ALPRAZOLAM 0.25 MG PO TABS
0.1250 mg | ORAL_TABLET | Freq: Every day | ORAL | 0 refills | Status: AC | PRN
Start: 2023-11-01 — End: ?
  Filled 2023-11-01: qty 10, 10d supply, fill #0

## 2023-11-01 MED ORDER — LEVOTHYROXINE SODIUM 75 MCG PO TABS
75.0000 ug | ORAL_TABLET | Freq: Every day | ORAL | 0 refills | Status: AC
Start: 2023-11-01 — End: ?
  Filled 2023-11-01: qty 30, 30d supply, fill #0

## 2023-11-01 MED ORDER — PANTOPRAZOLE SODIUM 40 MG PO TBEC
40.0000 mg | DELAYED_RELEASE_TABLET | Freq: Two times a day (BID) | ORAL | Status: DC
  Administered 2023-11-01 – 2023-11-04 (×6): 40 mg via ORAL
  Filled 2023-11-01 (×6): qty 1

## 2023-11-01 MED ORDER — DAPAGLIFLOZIN PROPANEDIOL 10 MG PO TABS
10.0000 mg | ORAL_TABLET | Freq: Every day | ORAL | 0 refills | Status: DC
  Filled 2023-11-01: qty 30, 30d supply, fill #0

## 2023-11-01 MED ORDER — PANTOPRAZOLE SODIUM 40 MG PO TBEC
40.0000 mg | DELAYED_RELEASE_TABLET | Freq: Every day | ORAL | 0 refills | Status: DC
Start: 2023-11-01 — End: 2023-11-03
  Filled 2023-11-01: qty 30, 30d supply, fill #0

## 2023-11-01 NOTE — Progress Notes (Addendum)
 Patient ID: Shelly Fields, female   DOB: 1934-07-27, 89 y.o.   MRN: [id number]  Per medical team, pt will now d/c on 5/28.  Per OT, pt will need youth RW.   SW ordered DME with [employer] via parachute.  *SW met with pt and grandchildren to discussed DME ordered, and preferred [facility]. SW will complete chart audit as family reports services were recent. SW will call pt son Shelly Fields to discuss.   Chart review shows previous [name].   3- SW spoke with pt son Shelly Fields on above. SW will follow-up with updates.   SW sent HHPT/OT/aide referral to [name] and waiting on follow-up.  Norval Been, MSW, LCSW Office: [phone] Cell: [phone] Fax: [phone]

## 2023-11-01 NOTE — Progress Notes (Signed)
 Physical Therapy Session Note  Patient Details  Name: Shelly Fields MRN: [id number] Date of Birth: 02/14/1935  Today's Date: 11/01/2023 PT Individual Time: 0900-0930 and 1447-1530 PT Individual Time Calculation (min): 30 min and 43 min.  Short Term Goals: Week 1:  PT Short Term Goal 1 (Week 1): STG = LTG d/t ELOS  Skilled Therapeutic Interventions/Progress Updates:   First session: Pt presents semi-reclined in bed and agreeable to therapy.  Pt assisted w/ dressing, at set-up level long sitting and then sitting EOB.  Pt transfers sit to stand w/ mod I, although reminders for hand placement.  Pt amb 200' w/ Mod I and RW.  Pt returned to room and transferred to recliner w/ good carryover of hand placement.  Chair alarm on and all needs in reach.  Second session:  Pt presents supine in bed and agreeable to therapy.  Pt transfers w/ independence.  Pt amb x 200' w/ RW and mod I, DIL present and receiving cues for safety during gait I.e. position in RW, visual scanning, L hand placement, keeping b hands on RW when amb.  Pt performed car transfer at CRV height w/ sup/mod I.  Pt negotiated ramped surface w/ mod I and RW.  Pt negotiated 5" platform w/ RW and supervision x 2.  Pt returned to room and remained sitting in recliner w/ LES elevated, chair alarm on and DIL in room.     Therapy Documentation Precautions:  Precautions Precautions: Fall Precaution/Restrictions Comments: mild L hemipareisis Restrictions Weight Bearing Restrictions Per Provider Order: No General:   Vital Signs:   Pain:0/10     Therapy/Group: Individual Therapy  Venora Kautzman P Janet Humphreys 11/01/2023, 12:14 PM

## 2023-11-01 NOTE — Progress Notes (Signed)
 PROGRESS NOTE   Subjective/Complaints: Pt in room with family; no complaints. Feels well. Wanted to talk about plan for GI issues and meds.    ROS: Patient denies fever, rash, sore throat, blurred vision, dizziness, nausea, vomiting, diarrhea, cough, shortness of breath or chest pain, joint or back/neck pain, headache, or mood change.     Objective:   No results found. Recent Labs    10/30/23 0737 10/31/23 0648  WBC 5.6  --   HGB 8.9* 8.6*  HCT 27.5* 27.7*  PLT 269  --    Recent Labs    10/29/23 1430 10/30/23 0737  NA 127* 131*  K 3.2* 4.6  CL 94* 96*  CO2 24 24  GLUCOSE 81 93  BUN 18 18  CREATININE 0.75 0.74  CALCIUM 8.5* 9.3    Intake/Output Summary (Last 24 hours) at 11/01/2023 1610 Last data filed at 11/01/2023 0847 Gross per 24 hour  Intake 820 ml  Output --  Net 820 ml     Pressure Injury 10/26/23 Coccyx Mid Stage 1 -  Intact skin with non-blanchable redness of a localized area usually over a bony prominence. (Active)  10/26/23 1220  Location: Coccyx  Location Orientation: Mid  Staging: Stage 1 -  Intact skin with non-blanchable redness of a localized area usually over a bony prominence.  Wound Description (Comments):   Present on Admission: Yes    Physical Exam: Vital Signs Blood pressure 113/66, pulse 98, temperature 98 F (36.7 C), temperature source Oral, resp. rate 17, height 5\' 2"  (1.575 m), weight 38.4 kg, SpO2 97%.  Constitutional: No distress . Vital signs reviewed. HEENT: NCAT, EOMI, oral membranes moist Neck: supple Cardiovascular: RRR without murmur. No JVD    Respiratory/Chest: CTA Bilaterally without wheezes or rales. Normal effort    GI/Abdomen: BS +, non-tender, non-distended Ext: no clubbing, cyanosis, or edema Psych: pleasant and cooperative  Skin: Clean and intact without signs of breakdown. A few bruises Neuro:   alert and oriented.  HOH. Mild left central VII. Speech sl  dysarthric. MMT: RUE and RLE 4+ to 5/5 throughout. LUE 4 to 4+/5. LLE 4+ to 5-/5.Sensory exam normal for light touch and pain in all 4 limbs. No limb ataxia or cerebellar signs. No abnormal tone appreciated.  Prior neuro assessment is c/w today's exam 11/01/2023.  Musculoskeletal: Full ROM, No pain with AROM or PROM in the neck, trunk, or extremities. Posture appropriate    Assessment/Plan: 1. Functional deficits which require 3+ hours per day of interdisciplinary therapy in a comprehensive inpatient rehab setting. Physiatrist is providing close team supervision and 24 hour management of active medical problems listed below. Physiatrist and rehab team continue to assess barriers to discharge/monitor patient progress toward functional and medical goals  Care Tool:  Bathing    Body parts bathed by patient: Right arm, Left arm, Chest, Abdomen, Front perineal area, Buttocks, Right upper leg, Left upper leg, Right lower leg, Left lower leg, Face         Bathing assist Assist Level: Supervision/Verbal cueing     Upper Body Dressing/Undressing Upper body dressing   What is the patient wearing?: Bra, Pull over shirt    Upper body  assist Assist Level: Independent    Lower Body Dressing/Undressing Lower body dressing      What is the patient wearing?: Underwear/pull up, Pants     Lower body assist Assist for lower body dressing: Supervision/Verbal cueing     Toileting Toileting    Toileting assist Assist for toileting: Supervision/Verbal cueing     Transfers Chair/bed transfer  Transfers assist     Chair/bed transfer assist level: Independent with assistive device     Locomotion Ambulation   Ambulation assist      Assist level: Supervision/Verbal cueing Assistive device: Rollator Max distance: 500 ft   Walk 10 feet activity   Assist     Assist level: Independent with assistive device Assistive device: Rollator   Walk 50 feet activity   Assist     Assist level: Independent with assistive device Assistive device: Rollator    Walk 150 feet activity   Assist    Assist level: Independent with assistive device Assistive device: Rollator    Walk 10 feet on uneven surface  activity   Assist     Assist level: Independent with assistive device Assistive device: Rollator   Wheelchair     Assist Is the patient using a wheelchair?: No   Wheelchair activity did not occur: N/A         Wheelchair 50 feet with 2 turns activity    Assist    Wheelchair 50 feet with 2 turns activity did not occur: N/A       Wheelchair 150 feet activity     Assist  Wheelchair 150 feet activity did not occur: N/A       Blood pressure 113/66, pulse 98, temperature 98 F (36.7 C), temperature source Oral, resp. rate 17, height 5\' 2"  (1.575 m), weight 38.4 kg, SpO2 97%.  Medical Problem List and Plan: 1. Functional deficits secondary to right MCA territory infarction involving the right insula and overlying frontal parietal lobes/occlusion of distal M2 branch of the right MCA             -patient may  shower             -ELOS/Goals:  dc home potentially wed or Thursday depending upon medical -Continue CIR therapies including PT, OT  2.  Antithrombotics: -DVT/anticoagulation:  Mechanical: Antiembolism stockings, thigh (TED hose) Bilateral lower extremities             -antiplatelet therapy:  -5/26 -resume asa and hold plavix  until tomorrow per GI 3. Pain Management: Tylenol  as needed 4. Mood/Behavior/Sleep: Xanax  0.125-0.25 mg daily as needed, melatonin 3 mg nightly as needed             -antipsychotic agents: N/A 5. Neuropsych/cognition: This patient is capable of making decisions on her own behalf. 6. Skin/Wound Care: Routine skin checks 7. Fluids/Electrolytes/Nutrition: continue to encourage po intake  5/26-hyponatremia; hypokalemia     -holding lasix  helping   -  kdur to bid--reduce to daily               -recheck bmet tomorrow  8.  Atrial fibrillation/Cards.  Followed by cardiology service Dr. Londa Rival.  Anticoagulation recently discontinued due to GI bleed.  Toprol -XL 50 mg daily.   -Currently heart rate controlled -5/23 -holding lasix  (40mg  every other day Th and 60mh every other day Wed) given #7  5/26- weights stable at 38kg  -f/u with cards tomorrow re: resumption. Does she need that much 9.  Type 2 diabetes mellitus.  Hemoglobin A1c 5.2.  SSI   11.  Hypothyroidism:  Continue Synthroid   12.  Hypertension.  continue-XL 75 mg daily, Lasix  40 mg every 48 hours alternating with 60 mg every 48 hours   13.  Constipation: continue Colace 100 mg twice daily, Metamucil 1 packet twice daily.  14.  UGI bleed/acute on chronic anemia:  Recent admission 09/22/2023 followed by Dr. Alita Irwin.  Prior endoscopy and colonoscopy showed gastritis, H. pylori, diverticulosis and hemorrhoids.  -on FeSO4     - HGB stabilizing  5/26-started PPI--increase to bud  -appreciate GI consult, endoscopy--angioectasia in duodenum   -APC performed   -resume asa today, resume plavix  tomorrow   -check cbc today  LOS: 6 days A FACE TO FACE EVALUATION WAS PERFORMED  Rawland Caddy 11/01/2023, 9:07 AM

## 2023-11-01 NOTE — Progress Notes (Signed)
 Occupational Therapy Session Note  Patient Details  Name: Shelly Fields MRN: [id number] Date of Birth: 02/09/1935  Today's Date: 11/01/2023 OT Individual Time: 7829-5621 and 3086-5784  OT Individual Time Calculation (min): 70 min and 70 min    Short Term Goals: Week 1:  OT Short Term Goal 1 (Week 1): LTG=STG overall mod I goals OT Short Term Goal 2 (Week 1): Pt will perform simple meal prep activity with AD with mod I  Skilled Therapeutic Interventions/Progress Updates:    Visit 1:  Pain: no c/o pain  Pt seen this session for family education with her son and dtr in law.  Pt stated she was feeling well today post her procedure yesterday. Family education: -pt demonstrated how she used RW to ambulate -stepping over tub wall with supervision and discussed location of grab bars -reviewed safe strategies with bathing and drying off (ie sitting on toilet to dry feet and hair),  placement of bath mats -discussed levels of supervision as pt initially returns home -explained that she continues to have some LLE weakness and will give her some exercises for home to complement HHPT/OT -reviewed BEFAST and suggested they place sign on refridgerator for easy reminders -suggested implement AMAZON echos to use so she can ask Alexa to call family or they can "drop in" on her easily  Pt does have mod ind goals but family plans to spend the night and supervise her at home the first 2 weeks to ensure she is managing safely as [place] older [place].   Pt returned to room with all needs met.  Chair alarm set.     Visit 2:  Pain: no c/o pain  Pt received in recliner with family present.   Pt agreeable to going to gym to focus on a HEP.  Pt moved about room with RW with distant supervision.   Mod independent with toilet transfer and toileting once in the bathroom. Pt used RW to ambulate to gym demonstrating good awareness of LLE.  In gym, pt worked on sidelying exercises on therapy mat that she can  do from bed at home. From seated she completed theraband (yellow/light) for LUE.  Pt then worked on standing exercises in the kitchen standing at the counter.  Set up a HEP for her from [employer] and had her follow the video prompts. Pt sent video link via her cell phone and given a paper copy.    Access Code: [email or web address] URL: [email or web address] Date: 11/01/2023 Prepared by: [facility]  Exercises - Side Leg Lifts  - 1 x daily - 7 x weekly - 2 sets - 10 reps - Modified Clamshell  - 1 x daily - 7 x weekly - 2 sets - 10 reps - Sit to Stand Without Arm Support  - 1 x daily - 7 x weekly - 2 sets - 10 reps - Standing Hip Abduction with Counter Support  - 1 x daily - 7 x weekly - 2 sets - 10 reps - Standing March with Counter Support  - 1 x daily - 7 x weekly - 2 sets - 10 reps - Standing Hip Extension with Counter Support  - 1 x daily - 7 x weekly - 2 sets - 10 reps - Standing Knee Flexion with Counter Support  - 1 x daily - 7 x weekly - 2 sets - 10 reps - Seated Shoulder Horizontal Abduction with Resistance  - 1 x daily - 7 x weekly - 2 sets -  10 reps - Seated Shoulder Flexion with Self-Anchored Resistance  - 1 x daily - 7 x weekly - 2 sets - 10 reps - Seated Shoulder Diagonal Pulls with Resistance  - 1 x daily - 7 x weekly - 2 sets - 10 reps  Pt ambulated back to her room and opted to rest in bed until next session.  All needs met, alarm on.            Therapy Documentation Precautions:  Precautions Precautions: Fall Precaution/Restrictions Comments: mild L hemipareisis Restrictions Weight Bearing Restrictions Per Provider Order: No    Vital Signs: Therapy Vitals Temp: 97.9 F (36.6 C) Pulse Rate: 79 Resp: 16 BP: (!) 140/76 Patient Position (if appropriate): Sitting Oxygen Therapy SpO2: 100 % O2 Device: Room Air    ADL: ADL Eating: Independent Grooming: Independent Where Assessed-Grooming: Standing at sink Upper Body Bathing:  Supervision/safety Where Assessed-Upper Body Bathing: Shower Lower Body Bathing: Supervision/safety Where Assessed-Lower Body Bathing: Shower, Chair Upper Body Dressing: Independent Where Assessed-Upper Body Dressing: Edge of bed Lower Body Dressing: Supervision/safety Where Assessed-Lower Body Dressing: Edge of bed Toileting: Modified independent Where Assessed-Toileting: Teacher, adult education: Engineer, agricultural Method: Proofreader: Engineer, technical sales: Close supervison Web designer Method: Ship broker: Acupuncturist: Close supervision Film/video editor Method: Designer, industrial/product: Grab bars, Shower seat with back     Therapy/Group: Individual Therapy  Tasheika Kitzmiller 11/01/2023, 4:03 PM

## 2023-11-02 ENCOUNTER — Other Ambulatory Visit (HOSPITAL_COMMUNITY): Payer: Self-pay

## 2023-11-02 DIAGNOSIS — I63511 Cerebral infarction due to unspecified occlusion or stenosis of right middle cerebral artery: Secondary | ICD-10-CM | POA: Diagnosis not present

## 2023-11-02 DIAGNOSIS — D62 Acute posthemorrhagic anemia: Secondary | ICD-10-CM | POA: Diagnosis not present

## 2023-11-02 DIAGNOSIS — I5032 Chronic diastolic (congestive) heart failure: Secondary | ICD-10-CM

## 2023-11-02 DIAGNOSIS — I10 Essential (primary) hypertension: Secondary | ICD-10-CM | POA: Diagnosis not present

## 2023-11-02 DIAGNOSIS — K922 Gastrointestinal hemorrhage, unspecified: Secondary | ICD-10-CM | POA: Diagnosis not present

## 2023-11-02 DIAGNOSIS — I639 Cerebral infarction, unspecified: Secondary | ICD-10-CM

## 2023-11-02 DIAGNOSIS — I4821 Permanent atrial fibrillation: Secondary | ICD-10-CM

## 2023-11-02 LAB — BASIC METABOLIC PANEL WITH GFR
Anion gap: 4 — ABNORMAL LOW (ref 5–15)
BUN: 12 mg/dL (ref 8–23)
CO2: 22 mmol/L (ref 22–32)
Calcium: 8.1 mg/dL — ABNORMAL LOW (ref 8.9–10.3)
Chloride: 101 mmol/L (ref 98–111)
Creatinine, Ser: 0.78 mg/dL (ref 0.44–1.00)
GFR, Estimated: >60 mL/min (ref >60)
Glucose, Bld: 92 mg/dL (ref 70–99)
Potassium: 4.6 mmol/L (ref 3.5–5.1)
Sodium: 127 mmol/L — ABNORMAL LOW (ref 135–145)

## 2023-11-02 LAB — CBC
HCT: 26.3 % — ABNORMAL LOW (ref 36.0–46.0)
Hemoglobin: 8.2 g/dL — ABNORMAL LOW (ref 12.0–15.0)
MCH: 28 pg (ref 26.0–34.0)
MCHC: 31.2 g/dL (ref 30.0–36.0)
MCV: 89.8 fL (ref 80.0–100.0)
Platelets: 339 10*3/uL (ref 150–400)
RBC: 2.93 MIL/uL — ABNORMAL LOW (ref 3.87–5.11)
RDW: 16.1 % — ABNORMAL HIGH (ref 11.5–15.5)
WBC: 6.6 10*3/uL (ref 4.0–10.5)
nRBC: 0 % (ref 0.0–0.2)

## 2023-11-02 MED ORDER — DAPAGLIFLOZIN PROPANEDIOL 10 MG PO TABS
10.0000 mg | ORAL_TABLET | Freq: Every day | ORAL | Status: DC
  Administered 2023-11-02 – 2023-11-04 (×3): 10 mg via ORAL
  Filled 2023-11-02 (×3): qty 1

## 2023-11-02 MED ORDER — FUROSEMIDE 20 MG PO TABS
20.0000 mg | ORAL_TABLET | Freq: Every day | ORAL | Status: DC
  Administered 2023-11-02 – 2023-11-04 (×3): 20 mg via ORAL
  Filled 2023-11-02 (×3): qty 1

## 2023-11-02 NOTE — Progress Notes (Addendum)
 Patient ID: Shelly Fields, female   DOB: August 29, 1934, 89 y.o.   MRN: [id number]  SW spoke with [name] to discuss referral. Referral has been accepted. SW faxed order to [phone].  SW spoke with pt and pt son Shelly Fields to inform on [facility] in place.   *SW informed by attending pt is likely not discharging tomorrow.   1546- SW updated pt son Shelly Fields on change in d/c date.   SW updated pt as well.   Norval Been, MSW, LCSW Office: [phone] Cell: [phone] Fax: [phone]

## 2023-11-02 NOTE — Plan of Care (Signed)

## 2023-11-02 NOTE — Consult Note (Addendum)
 As below, patient seen and examined.  Briefly she is an 89 year old female with past medical history of permanent atrial fibrillation, recent stroke, GI bleed, heart failure preserved ejection fraction, diabetes mellitus, hypertension for evaluation of congestive heart failure.  Patient recently discharged following admission for symptomatic anemia with hemoglobin of 5.9.  Her Xarelto  was held and recommendations were for outpatient follow-up with colorectal surgeon to rule out anorectal lesion given grade 4 hemorrhoids and anal lesion.  She was readmitted on May 15 with acute CVA.  She has been seen by gastroenterology after being transferred to rehabilitation for variable hemoglobin and had capsule endoscopy notable for active bleeding in the proximal duodenum.  She had EGD on May 25 revealing a single nonbleeding angioectasia in the duodenum treated with APC.  She denies dyspnea, chest pain, palpitations or syncope.  No pedal edema.  Most recent echocardiogram 4/25 showed normal LV function, mild left ventricular hypertrophy, mild RV dysfunction, severe right ventricular enlargement, moderate left atrial lodgment, severe right atrial enlargement, small to moderate pericardial effusion, mild to moderate mitral regurgitation, moderate to severe tricuspid regurgitation, mild aortic insufficiency.  1 Chronic heart failure preserved ejection fraction-she appears to be euvolemic on examination.  Will resume Farxiga  and Lasix  20 mg daily.  Will check potassium and renal function 1 week following discharge.  Note dose of Lasix  may need to be increased as an outpatient pending exam at time of follow-up (she was on 40 mg alternating with 60 mg every other day at home prior to admission).  2 permanent atrial fibrillation-continue Toprol  for rate control.  She is not a candidate for anticoagulation at present given recent GI bleeds.  I discussed the risks and benefits and she understands the higher risk of CVA off  of anticoagulation but at this point there is no other option.  3 acute CVA-on aspirin  and Plavix  per neurology.  Patient can be discharged from a cardiac standpoint.  Check potassium and renal function 1 week after discharge.  Please call with questions.  Alexandria Angel, MD   Cardiology Consultation   Patient ID: Shelly Fields MRN: [id number]; DOB: 1935/03/15  Admit date: 10/26/2023 Date of Consult: 11/02/2023  PCP:  Orlena Bitters, MD   [facility] Providers Cardiologist:  Teddie Favre, MD      Patient Profile:   Shelly Fields is a 89 y.o. female with a hx of permanent afib, HFpEF, DM and HTN who is being seen 11/02/2023 for the evaluation of medication management of CHF at the request of Dr. Sharl Davies.  History of Present Illness:   Shelly Fields is a 89 yo female with PMH noted above.  Followed by Dr. Londa Rival as an outpatient.  Known history of persistent atrial fibrillation has been maintained on Xarelto  15 mg daily as well as Toprol  XL 75 mg twice daily.  Known history of HFpEF with LVEF of 60 to 65%, severely dilated RV with mildly elevated PASP of 36 mmHg by echo 02/2022.  She is on an alternating regimen of Demadex  20 mg alternating with 40 mg every other day as well as Farxiga .  Seen in the office 08/26/2023 for preoperative evaluation to undergo excision of squamous cell carcinoma on her right leg.  Admitted 09/17/2023 with symptomatic anemia with a hemoglobin of 5.9 positive stools. Given Andexxa .  Treated with IV Lasix  and transition back to 40 mg daily at the time of discharge.  Was seen by GI during that admission with recommendations for outpatient colorectal surgeon  to follow-up for rule out of anorectal lesion given grade 4 hemorrhoids and anal lesion.  Her Xarelto  was held at the time of discharge.  Admitted 10/21/2023 with acute ischemic stroke.  Brain MRI demonstrated right MCA CVA and neurology recommended DAPT x 90 days and then aspirin  as monotherapy  thereafter.  Echo 5/16 with mild LVH, indeterminant LV diastolic parameters, moderately reduced/and severely enlarged RV.  Severely elevated pulmonary artery pressure, severe biatrial enlargement, mild MR, severe TR. She was discharged to CIR on 5/20.  She was seen by GI while in rehab secondary to variable hemoglobin.  Underwent capsule endoscopy notable for active bleeding in the proximal duodenum.  Underwent endoscopy 5/25 with mild Schatzki's ring, 4 cm hiatal hernia, single nonbleeding angioectasia in the duodenum treated with APC.  Recommendations to resume Plavix  5/27.  She is nearing discharge from CIR, cardiology asked to evaluate in regards to her heart failure regimen.   Past Medical History:  Diagnosis Date   Cor pulmonale (chronic) (HCC)    Diverticulosis    Essential hypertension    Hypothyroidism    Internal hemorrhoids    Persistent atrial fibrillation (HCC)    Pulmonary hypertension (HCC)    Seasonal allergies    Shingles     Past Surgical History:  Procedure Laterality Date   ABDOMINAL HYSTERECTOMY     BIOPSY  03/25/2023   Procedure: BIOPSY;  Surgeon: Hargis Lias, MD;  [facility]: [facility];  Service: Endoscopy;;   CATARACT EXTRACTION     COLONOSCOPY N/A 01/12/2013   Procedure: COLONOSCOPY;  Surgeon: Ruby Corporal, MD;  Location: [facility];  Service: Endoscopy;  Laterality: N/A;  1030   COLONOSCOPY N/A 11/18/2017   Procedure: COLONOSCOPY;  Surgeon: Ruby Corporal, MD;  Location: [facility];  Service: Endoscopy;  Laterality: N/A;   COLONOSCOPY WITH PROPOFOL  N/A 03/25/2023   Procedure: COLONOSCOPY WITH PROPOFOL ;  Surgeon: Hargis Lias, MD;  Location: [facility];  Service: Endoscopy;  Laterality: N/A;  11:00AM;ASA 3   ENTEROSCOPY N/A 10/31/2023   Procedure: ENTEROSCOPY;  Surgeon: Elois Hair, MD;  Location: [facility] ENDOSCOPY;  Service: Gastroenterology;  Laterality: N/A;   ESOPHAGOGASTRODUODENOSCOPY (EGD) WITH PROPOFOL  N/A 03/25/2023    Procedure: ESOPHAGOGASTRODUODENOSCOPY (EGD) WITH PROPOFOL ;  Surgeon: Hargis Lias, MD;  Location: [facility];  Service: Endoscopy;  Laterality: N/A;  11:00;ASA 3   GIVENS CAPSULE STUDY N/A 10/29/2023   Procedure: IMAGING PROCEDURE, GI TRACT, INTRALUMINAL, VIA CAPSULE;  Surgeon: Elois Hair, MD;  Location: [facility] ENDOSCOPY;  Service: Gastroenterology;  Laterality: N/A;   HOT HEMOSTASIS N/A 10/31/2023   Procedure: ARGON PLASMA COAGULATION;  Surgeon: Elois Hair, MD;  Location: [facility] ENDOSCOPY;  Service: Gastroenterology;  Laterality: N/A;   LESION REMOVAL Right 09/13/2023   Procedure: excision of right lower extremity squamous cell carcinoma;  Surgeon: Marijo Shove, DO;  Location: [facility];  Service: General;  Laterality: Right;   POLYPECTOMY  11/18/2017   Procedure: POLYPECTOMY;  Surgeon: Ruby Corporal, MD;  Location: [facility];  Service: Endoscopy;;  colon     Inpatient Medications: Scheduled Meds:  aspirin  EC  81 mg Oral Daily   clopidogrel   75 mg Oral Daily   docusate sodium   100 mg Oral BID   ferrous gluconate   324 mg Oral Q48H   levothyroxine   75 mcg Oral Q0600   metoprolol  succinate  75 mg Oral Daily   pantoprazole   40 mg Oral BID   potassium chloride   20 mEq Oral  Daily   psyllium  1 packet Oral BID   sodium chloride   1 spray Each Nare TID   Continuous Infusions:  PRN Meds: acetaminophen  **OR** acetaminophen , ALPRAZolam , melatonin, ondansetron  **OR** ondansetron  (ZOFRAN ) IV  Allergies:   No Known Allergies  Social History:   Social History   Socioeconomic History   Marital status: Widowed    Spouse name: Not on file   Number of children: Not on file   Years of education: Not on file   Highest education level: Not on file  Occupational History   Not on file  Tobacco Use   Smoking status: Never    Passive exposure: Past   Smokeless tobacco: Never  Vaping Use   Vaping status: Never Used  Substance and Sexual Activity   Alcohol use: No     Alcohol/week: 0.0 standard drinks of alcohol   Drug use: No   Sexual activity: Not on file  Other Topics Concern   Not on file  Social History Narrative   Not on file   Social Drivers of Health   Financial Resource Strain: Not on file  Food Insecurity: No Food Insecurity (10/21/2023)   Hunger Vital Sign    Worried About Running Out of Food in the Last Year: Never true    Ran Out of Food in the Last Year: Never true  Transportation Needs: No Transportation Needs (10/21/2023)   PRAPARE - Administrator, Civil Service (Medical): No    Lack of Transportation (Non-Medical): No  Physical Activity: Not on file  Stress: Not on file  Social Connections: Moderately Integrated (10/21/2023)   Social Connection and Isolation Panel [NHANES]    Frequency of Communication with Friends and Family: More than three times a week    Frequency of Social Gatherings with Friends and Family: More than three times a week    Attends Religious Services: 1 to 4 times per year    Active Member of [employer] or Organizations: Patient declined    Attends Banker Meetings: 1 to 4 times per year    Marital Status: Widowed  Intimate Partner Violence: Not At Risk (10/21/2023)   Humiliation, Afraid, Rape, and Kick questionnaire    Fear of Current or Ex-Partner: No    Emotionally Abused: No    Physically Abused: No    Sexually Abused: No    Family History:    Family History  Problem Relation Age of Onset   Heart disease Mother    Hypertension Father    Colon cancer Neg Hx      ROS:  Please see the history of present illness.   All other ROS reviewed and negative.     Physical Exam/Data:   Vitals:   11/01/23 0533 11/01/23 1544 11/01/23 1945 11/02/23 0511  BP: 113/66 (!) 140/76 124/71 (!) 142/65  Pulse: 98 79 89 83  Resp: 17 16 15 16   Temp: 98 F (36.7 C) 97.9 F (36.6 C) 98.2 F (36.8 C) 97.8 F (36.6 C)  TempSrc: Oral Oral Oral Oral  SpO2: 97% 100% 98% 99%  Weight: 38.4 kg      Height:        Intake/Output Summary (Last 24 hours) at 11/02/2023 1218 Last data filed at 11/02/2023 0729 Gross per 24 hour  Intake 520 ml  Output --  Net 520 ml      11/01/2023    5:33 AM 10/31/2023   11:31 AM 10/31/2023    7:00 AM  Last 3 Weights  Weight (lbs) 84 lb 10.5 oz 85 lb 5.1 oz 85 lb 5.1 oz  Weight (kg) 38.4 kg 38.7 kg 38.7 kg     Body mass index is 15.48 kg/m.  General:  Thin, frail older female HEENT: normal Neck: no JVD Vascular: No carotid bruits; Distal pulses 2+ bilaterally Cardiac:  normal S1, S2; Irreg Irreg; + 3/6 systolic murmur  Lungs:  clear to auscultation bilaterally, no wheezing, rhonchi or rales  Abd: soft, nontender, no hepatomegaly  Ext: no edema Musculoskeletal:  No deformities, BUE and BLE strength normal and equal Skin: warm and dry  Neuro:  CNs 2-12 intact, no focal abnormalities noted Psych:  Normal affect   EKG:  The EKG was personally reviewed and demonstrates: 5/15 atrial fibrillation, 90bpm  Telemetry:  Telemetry was personally reviewed and demonstrates:  n/a   Relevant CV Studies:  Echo: 10/22/2023  IMPRESSIONS     1. There is mild left ventricular hypertrophy. Left ventricular diastolic  parameters are indeterminate. There is the interventricular septum is  flattened in systole and diastole, consistent with right ventricular  pressure and volume overload.   2. Right ventricular systolic function is moderately reduced. The right  ventricular size is severely enlarged. There is severely elevated  pulmonary artery systolic pressure. The estimated right ventricular  systolic pressure is 42.2 mmHg.   3. Left atrial size was severely dilated.   4. Right atrial size was severely dilated.   5. The mitral valve is abnormal. Mild mitral valve regurgitation. No  evidence of mitral stenosis.   6. The tricuspid valve is abnormal. Tricuspid valve regurgitation is  severe.   7. The aortic valve is tricuspid. Aortic valve regurgitation is  mild. No  aortic stenosis is present.   8. The inferior vena cava is dilated in size with <50% respiratory  variability, suggesting right atrial pressure of 15 mmHg.   9. Limited echo in setting of stroke   Laboratory Data:  High Sensitivity Troponin:  No results for input(s): "TROPONINIHS" in the last 720 hours.   Chemistry Recent Labs  Lab 10/29/23 1430 10/30/23 0737 11/02/23 0501  NA 127* 131* 127*  K 3.2* 4.6 4.6  CL 94* 96* 101  CO2 24 24 22   GLUCOSE 81 93 92  BUN 18 18 12   CREATININE 0.75 0.74 0.78  CALCIUM 8.5* 9.3 8.1*  GFRNONAA >60 >60 >60  ANIONGAP 9 11 4*    Recent Labs  Lab 10/27/23 0507  PROT 5.9*  ALBUMIN  2.8*  AST 17  ALT 12  ALKPHOS 65  BILITOT 0.7   Lipids No results for input(s): "CHOL", "TRIG", "HDL", "LABVLDL", "LDLCALC", "CHOLHDL" in the last 168 hours.  Hematology Recent Labs  Lab 10/30/23 0737 10/31/23 0648 11/01/23 0954 11/02/23 0501  WBC 5.6  --  7.9 6.6  RBC 3.14*  --  3.24* 2.93*  HGB 8.9* 8.6* 9.1* 8.2*  HCT 27.5* 27.7* 29.5* 26.3*  MCV 87.6  --  91.0 89.8  MCH 28.3  --  28.1 28.0  MCHC 32.4  --  30.8 31.2  RDW 15.8*  --  16.2* 16.1*  PLT 269  --  347 339   Thyroid  No results for input(s): "TSH", "FREET4" in the last 168 hours.  BNPNo results for input(s): "BNP", "PROBNP" in the last 168 hours.  DDimer No results for input(s): "DDIMER" in the last 168 hours.   Radiology/Studies:  No results found.     Assessment and Plan:   Shelly Fields is a 89 y.o. female  with a hx of permanent afib, HFpEF, DM and HTN who is being seen 11/02/2023 for the evaluation of medication management of CHF at the request of Dr. Sharl Davies.  Chronic HFpEF RV dysfunction Pulmonary HTN -- appear euvolemic on exam, has not been receiving lasix  during CIR admission. Reports she was on Faxiga PTA. Was on alternating dose of lasix  with 40mg /60mg  every other day PTA.  -- GDMT: continue Toprol  XL 75mg  daily, would resume Farxiga  along with lasix  20mg   daily for now. Can further adjust outpatient as needed  -- she is adament about daily weights at home  Permanent atrial fibrillation -- no longer on Xarelto  in the setting of severe anemia with recent GIB as well as bleeding noted this admission while in CIR  Acute CVA -- on DAPT with ASA/plavix  per neurology, cleared to resume plavix  today per GI  Valvular disease -- echo showed mild MR, severe TR  Hyponatremia Anemia Hypothyroidism DM   Will arrange outpatient follow up appt  Risk Assessment/Risk Scores:   CHA2DS2-VASc Score = 5  This indicates a 7.2% annual risk of stroke. The patient's score is based upon: CHF History: 1 HTN History: 1 Diabetes History: 0 Stroke History: 0 Vascular Disease History: 0 Age Score: 2 Gender Score: 1    For questions or updates, please contact [facility] Please consult [email or web address] for contact info under    Signed, Johnie Nailer, NP  11/02/2023 12:18 PM

## 2023-11-02 NOTE — Plan of Care (Signed)
 Pt at bedrest. Alert and oriented. Tolerated po meds this pm. No c/o pain. Medicated with Melatonin per her request. Pt anticipates discharge on Wednesday. No acute distress. Call light in reach. Sr x3 elevated. Bed in low position.

## 2023-11-02 NOTE — Progress Notes (Signed)
 Physical Therapy Session Note  Patient Details  Name: Shelly Fields MRN: [id number] Date of Birth: Oct 14, 1934  Today's Date: 11/02/2023 PT Individual Time: 1024-1104 PT Individual Time Calculation (min): 40 min   Short Term Goals: Week 1:  PT Short Term Goal 1 (Week 1): STG = LTG d/t ELOS  Skilled Therapeutic Interventions/Progress Updates: Patient sitting in recliner on entrance to room. Patient alert and agreeable to PT session.   Patient reported no pain during session  Therapeutic Activity: Transfers: Pt performed sit<>stand transfers throughout session with modI and with rollator. Pt with good recall of locking brakes on rollator throughout.  - Pt ambulated throughout session to increase household/community ambulation with rollator and overall modI  6 Min Walk Test:  Instructed patient to ambulate as quickly and as safely as possible for 6 minutes using LRAD. Patient was allowed to take standing rest breaks without stopping the test, but if the patient required a sitting rest break the clock would be stopped and the test would be over.  Results: 892 feet (297.333 meters, Avg speed .844m/s) using a rollator with supervision. Results indicate that the patient has reduced endurance with ambulation compared to age matched norms.  Age Matched Norms: 57-69 yo M: 73 F: 62, 60-79 yo M: 36 F: 471, 1-89 yo M: 417 F: 392 MDC: 58.21 meters (190.98 feet) or 50 meters (ANPTA Core Set of Outcome Measures for Adults with Neurologic Conditions, 2018)  5xSTS (avg = 11.95s) without B UE assistance  - 12.94s  - 11.81s  - 11.09s  Neuromuscular Re-ed: NMR facilitated during session with focus on dynamic standing balance, coordination, proprioceptive feedback. - Pt ambulated around main gym with tidal tank in B UE's with overall close supervision - Pt performed x 8 sit<>stand from low mat with supervision and pt requiring seated rest break. - Standing on airex pad with tidal tank (B UE curls)  x 10 then seated rest break  NMR performed for improvements in motor control and coordination, balance, sequencing, judgement, and self confidence/ efficacy in performing all aspects of mobility at highest level of independence.   Patient sitting in recliner at end of session with brakes locked, and all needs within reach.      Therapy Documentation Precautions:  Precautions Precautions: Fall Precaution/Restrictions Comments: mild L hemipareisis Restrictions Weight Bearing Restrictions Per Provider Order: No   Therapy/Group: Individual Therapy  Gor Vestal PTA 11/02/2023, 1:31 PM

## 2023-11-02 NOTE — Progress Notes (Signed)
 Occupational Therapy Session Note  Patient Details  Name: Shelly Fields MRN: [id number] Date of Birth: 1934-08-27  Today's Date: 11/02/2023 OT Individual Time: 1130-1200 OT Individual Time Calculation (min): 30 min    Short Term Goals: Week 1:  OT Short Term Goal 1 (Week 1): LTG=STG overall mod I goals OT Short Term Goal 2 (Week 1): Pt will perform simple meal prep activity with AD with mod I  Skilled Therapeutic Interventions/Progress Updates:    Pt resting in recliner upon arrival. Skilled OT intervention with focus on discharge planning and functional amb with RW to prepare for discharge within the next couple of days. Pt reports she will use RW in home and Rollator when outside of home. Pt amb to day room before resting. Amb from day room to main gym. Amb from main gym to room. Pt continued with discussion of home setup and daily routine while amb. No LOB and pt able to safely navigate while engaged in conversation. No safety concerns. Pt remained in recliner. Belt alarm activated. All needs within reach.   Therapy Documentation Precautions:  Precautions Precautions: Fall Precaution/Restrictions Comments: mild L hemipareisis Restrictions Weight Bearing Restrictions Per Provider Order: No   Pain:  Pt denies pain this morning   Therapy/Group: Individual Therapy  Doak Free 11/02/2023, 12:08 PM

## 2023-11-02 NOTE — Progress Notes (Signed)
 Physical Therapy Session Note  Patient Details  Name: Shelly Fields MRN: [id number] Date of Birth: Feb 27, 1935  Today's Date: 11/02/2023 PT Individual Time: 1520-1550 PT Individual Time Calculation (min): 30 min   Short Term Goals: Week 1:  PT Short Term Goal 1 (Week 1): STG = LTG d/t ELOS  Skilled Therapeutic Interventions/Progress Updates:    Pt presents in room in bed, motivated to participate, and denies pain. Session focused on therapeutic activities for educating pt on DME as well as DC planning, and NMR for dynamic standing balance without UE support. Pt completes transfers modI throughout session. Pt ambulates modI with RW from room to day room ~120'. Therapist adjusts pt personal RW to promote improved mobility with switching rubber feet for plastic feet, pt ambulates 180' with therapist providing education on difference between two and safety with RW management, pt verbalizing understanding. Pt then completes NMR for dynamic standing balance without UE support, ambulates without device 180' with CGA, ambulates around 6 cones with CGA no device x2 trials, and completes ambulation while tapping cones x6 alternating BLE x3 trials with CGA/min assist. Pt requesting clarification on DC, therapist communicates with scheduler as well as social work to provide clarification on extending stay secondary to medical needs. Pt returns to room modI with RW and returns to bed where she remains with all needs within reach, call light in place, and bed alarm activated at end of session.  Therapy Documentation Precautions:  Precautions Precautions: Fall Precaution/Restrictions Comments: mild L hemipareisis Restrictions Weight Bearing Restrictions Per Provider Order: No   Therapy/Group: Individual Therapy  Annia Kilts PT, DPT 11/02/2023, 5:44 PM

## 2023-11-02 NOTE — Progress Notes (Signed)
 Occupational Therapy Note  Patient Details  Name: NATORI GUDINO MRN: [id number] Date of Birth: 10-22-1934  Pt's therapy schedule changed to Q.D as she has met the majority of her LTGs and is ready for discharge from a therapy perspective, but needs more time on rehab for medical concerns.  Team in agreement 1 session a day of PT and 1 of OT is more appropriate at this time.      Samay Delcarlo 11/02/2023, 12:27 PM

## 2023-11-02 NOTE — Progress Notes (Signed)
 Occupational Therapy Session Note  Patient Details  Name: Shelly Fields MRN: [id number] Date of Birth: 10/05/1934  Today's Date: 11/02/2023 OT Individual Time: 4098-1191 OT Individual Time Calculation (min): 41 min    Short Term Goals: Week 1:  OT Short Term Goal 1 (Week 1): LTG=STG overall mod I goals OT Short Term Goal 2 (Week 1): Pt will perform simple meal prep activity with AD with mod I  Skilled Therapeutic Interventions/Progress Updates:    Pt received in bed. Pt stated she got washed up this morning and put clean clothes on. She had already completed oral care.    Pt agreeable to working on standing IADLs of bed making, folding towels and large blankets. Pt worked in standing without use of RW. She was able to stand and fold 12 towels and then she carried 3 towels at a time to bathroom with supervision and no RW and reached to put towels on top of tall shelf 2x and placed others on towel racks.  Pt then worked on placing 2 large blankets on bed and did well walking around end of bed to tuck blankets under mattress.  OT helped pt initially fold 3 more large blankets working with pt until it became a smaller rectangle and then pt continued to fold to place at end of bed.    Pt took occasional standing rest breaks as needed.  Excellent safety awareness and balance.    Set pt's bed to the height of her queen bed at home and pt demonstrated how she sits on the bed by scooting back.  At end of session, pt resting in recliner with all needs met. Alarm set.   Therapy Documentation Precautions:  Precautions Precautions: Fall Precaution/Restrictions Comments: mild L hemipareisis Restrictions Weight Bearing Restrictions Per Provider Order: No    Vital Signs: Therapy Vitals Temp: 97.8 F (36.6 C) Temp Source: Oral Pulse Rate: 83 Resp: 16 BP: (!) 142/65 Patient Position (if appropriate): Lying Oxygen Therapy SpO2: 99 % O2 Device: Room Air   Therapy/Group: Individual  Therapy  Kabria Hetzer 11/02/2023, 8:58 AM

## 2023-11-02 NOTE — Progress Notes (Signed)
 Occupational Therapy Session Note  Patient Details  Name: Shelly Fields MRN: [id number] Date of Birth: 24-Apr-1935  Today's Date: 11/02/2023 OT Individual Time: 0935-1000 OT Individual Time Calculation (min): 25 min    Short Term Goals: Week 1:  OT Short Term Goal 1 (Week 1): LTG=STG overall mod I goals OT Short Term Goal 2 (Week 1): Pt will perform simple meal prep activity with AD with mod I  Skilled Therapeutic Interventions/Progress Updates:  Skilled OT intervention completed with focus on ambulatory and cardiovascular endurance. Pt received seated in recliner, agreeable to session. No pain reported.   Pt declined self-care needs. Completed all sit > stands and ambulatory transfers with mod I using RW.   Ambulated 100 ft > gym. Pt completed the following intervals on nustep utilizing pace partner for sustained pace, to promote global/cardiovascular endurance needed for independence with BADLs and functional mobility: -5 mins, level 4 -5 mins, level 2 Intermittent rest break needed for fatigue. Education provided on pursed lip breathing technique when pt feels winded for increased control of breath and recovery.  Ambulated 100 ft > room. Pt remained seated in recliner, with belt alarm on/activated, and with all needs in reach at end of session.   Therapy Documentation Precautions:  Precautions Precautions: Fall Precaution/Restrictions Comments: mild L hemipareisis Restrictions Weight Bearing Restrictions Per Provider Order: No    Therapy/Group: Individual Therapy  Ruthanna Covert, MS, OTR/L  11/02/2023, 10:06 AM

## 2023-11-02 NOTE — Progress Notes (Signed)
 PROGRESS NOTE   Subjective/Complaints: Pt up in bed. Feels great. Anxious to get home!  ROS: Patient denies fever, rash, sore throat, blurred vision, dizziness, nausea, vomiting, diarrhea, cough, shortness of breath or chest pain, joint or back/neck pain, headache, or mood change.    Objective:   No results found. Recent Labs    11/01/23 0954 11/02/23 0501  WBC 7.9 6.6  HGB 9.1* 8.2*  HCT 29.5* 26.3*  PLT 347 339   Recent Labs    11/02/23 0501  NA 127*  K 4.6  CL 101  CO2 22  GLUCOSE 92  BUN 12  CREATININE 0.78  CALCIUM 8.1*    Intake/Output Summary (Last 24 hours) at 11/02/2023 0853 Last data filed at 11/02/2023 4098 Gross per 24 hour  Intake 520 ml  Output --  Net 520 ml     Pressure Injury 10/26/23 Coccyx Mid Stage 1 -  Intact skin with non-blanchable redness of a localized area usually over a bony prominence. (Active)  10/26/23 1220  Location: Coccyx  Location Orientation: Mid  Staging: Stage 1 -  Intact skin with non-blanchable redness of a localized area usually over a bony prominence.  Wound Description (Comments):   Present on Admission: Yes    Physical Exam: Vital Signs Blood pressure (!) 142/65, pulse 83, temperature 97.8 F (36.6 C), temperature source Oral, resp. rate 16, height 5\' 2"  (1.575 m), weight 38.4 kg, SpO2 99%.  Constitutional: No distress . Vital signs reviewed. HEENT: NCAT, EOMI, oral membranes moist Neck: supple Cardiovascular: IRR IRR. No JVD    Respiratory/Chest: CTA Bilaterally without wheezes or rales. Normal effort    GI/Abdomen: BS +, non-tender, non-distended Ext: no clubbing, cyanosis, or edema Psych: pleasant and cooperative  Skin: Clean and intact without signs of breakdown. A few bruises Neuro:   alert and oriented.  HOH but does much better with hearing aids. Mild left central VII has improved. Speech clear. MMT: RUE and RLE 4+ to 5/5 throughout. LUE 4 to 4+/5.  LLE 4+ to 5-/5.Sensory exam normal for light touch and pain in all 4 limbs. No limb ataxia or cerebellar signs. No abnormal tone appreciated.  Prior neuro assessment is c/w today's exam 11/02/2023.  Musculoskeletal: Full ROM, No pain with AROM or PROM in the neck, trunk, or extremities. Posture appropriate    Assessment/Plan: 1. Functional deficits which require 3+ hours per day of interdisciplinary therapy in a comprehensive inpatient rehab setting. Physiatrist is providing close team supervision and 24 hour management of active medical problems listed below. Physiatrist and rehab team continue to assess barriers to discharge/monitor patient progress toward functional and medical goals  Care Tool:  Bathing    Body parts bathed by patient: Right arm, Left arm, Chest, Abdomen, Front perineal area, Buttocks, Right upper leg, Left upper leg, Right lower leg, Left lower leg, Face         Bathing assist Assist Level: Supervision/Verbal cueing     Upper Body Dressing/Undressing Upper body dressing   What is the patient wearing?: Bra, Pull over shirt    Upper body assist Assist Level: Independent    Lower Body Dressing/Undressing Lower body dressing  What is the patient wearing?: Underwear/pull up, Pants     Lower body assist Assist for lower body dressing: Supervision/Verbal cueing     Toileting Toileting    Toileting assist Assist for toileting: Independent with assistive device     Transfers Chair/bed transfer  Transfers assist     Chair/bed transfer assist level: Independent with assistive device     Locomotion Ambulation   Ambulation assist      Assist level: Independent with assistive device Assistive device: Walker-rolling Max distance: 200   Walk 10 feet activity   Assist     Assist level: Independent with assistive device Assistive device: Walker-rolling   Walk 50 feet activity   Assist    Assist level: Independent with assistive  device Assistive device: Walker-rolling    Walk 150 feet activity   Assist    Assist level: Independent with assistive device Assistive device: Walker-rolling    Walk 10 feet on uneven surface  activity   Assist     Assist level: Independent with assistive device Assistive device: Rollator   Wheelchair     Assist Is the patient using a wheelchair?: No   Wheelchair activity did not occur: N/A         Wheelchair 50 feet with 2 turns activity    Assist    Wheelchair 50 feet with 2 turns activity did not occur: N/A       Wheelchair 150 feet activity     Assist  Wheelchair 150 feet activity did not occur: N/A       Blood pressure (!) 142/65, pulse 83, temperature 97.8 F (36.6 C), temperature source Oral, resp. rate 16, height 5\' 2"  (1.575 m), weight 38.4 kg, SpO2 99%.  Medical Problem List and Plan: 1. Functional deficits secondary to right MCA territory infarction involving the right insula and overlying frontal parietal lobes/occlusion of distal M2 branch of the right MCA             -patient may  shower             -ELOS/Goals:  dc home potentially wed or Thursday depending upon medical issues below -Continue CIR therapies including PT, OT pending medical 2.  Antithrombotics: -DVT/anticoagulation:  Mechanical: Antiembolism stockings, thigh (TED hose) Bilateral lower extremities             -antiplatelet therapy:  -5/27 -resumed asa and begin plavix  today per GI 3. Pain Management: Tylenol  as needed 4. Mood/Behavior/Sleep: Xanax  0.125-0.25 mg daily as needed, melatonin 3 mg nightly as needed             -antipsychotic agents: N/A 5. Neuropsych/cognition: This patient is capable of making decisions on her own behalf. 6. Skin/Wound Care: Routine skin checks 7. Fluids/Electrolytes/Nutrition: continue to encourage po intake  5/26-hyponatremia; hypokalemia     -holding lasix  helping   -  kdur to bid--reduce to daily   5/27- sodium back to  127, potassium 4.6 -continue kdur 20meq daily -reaching out to cardiology for recs with lasix   8.  Atrial fibrillation/Cards.  Followed by cardiology service Dr. Londa Rival.  Anticoagulation recently discontinued due to GI bleed.  Toprol -XL 50 mg daily.   -Currently heart rate controlled -5/23 -holding lasix  (40mg  every other day Th and 60mh every other day Wed) given #7  5/26- weights stable at 38kg Filed Weights   10/31/23 0700 10/31/23 1131 11/01/23 0533  Weight: 38.7 kg 38.7 kg 38.4 kg    5/27 -f/u with cards today re: resumption of lasix .  Given that she was dry when she got here and weights have maintained off lasix , I would think smaller doses might suffice for outpt regimen.  9.  Type 2 diabetes mellitus.  Hemoglobin A1c 5.2.  SSI   11.  Hypothyroidism:  Continue Synthroid   12.  Hypertension.  continue-XL 75 mg daily, Lasix  40 mg every 48 hours alternating with 60 mg every 48 hours   13.  Constipation: continue Colace 100 mg twice daily, Metamucil 1 packet twice daily.  14.  UGI bleed/acute on chronic anemia:  Recent admission 09/22/2023 followed by Dr. Alita Irwin.  Prior endoscopy and colonoscopy showed gastritis, H. pylori, diverticulosis and hemorrhoids.  -on FeSO4     - HGB stabilizing  5/26-started PPI--increase to bid  -appreciate GI consult, endoscopy--angioectasia in duodenum   -APC performed   -resume asa today, resume plavix  tomorrow  5/27 Hgb up to 9.1 yesterday and down to 8.2 today--suspect this is partly just some lab variation. Stools reported as brown, not black yesterday   -continue plan as above   -recheck hgb in am  LOS: 7 days A FACE TO FACE EVALUATION WAS PERFORMED  Rawland Caddy 11/02/2023, 8:53 AM

## 2023-11-03 ENCOUNTER — Other Ambulatory Visit (HOSPITAL_COMMUNITY): Payer: Self-pay

## 2023-11-03 DIAGNOSIS — I63511 Cerebral infarction due to unspecified occlusion or stenosis of right middle cerebral artery: Secondary | ICD-10-CM | POA: Diagnosis not present

## 2023-11-03 DIAGNOSIS — I10 Essential (primary) hypertension: Secondary | ICD-10-CM | POA: Diagnosis not present

## 2023-11-03 DIAGNOSIS — D62 Acute posthemorrhagic anemia: Secondary | ICD-10-CM | POA: Diagnosis not present

## 2023-11-03 DIAGNOSIS — K922 Gastrointestinal hemorrhage, unspecified: Secondary | ICD-10-CM | POA: Diagnosis not present

## 2023-11-03 LAB — BASIC METABOLIC PANEL WITH GFR
Anion gap: 4 — ABNORMAL LOW (ref 5–15)
BUN: 13 mg/dL (ref 8–23)
CO2: 25 mmol/L (ref 22–32)
Calcium: 8.4 mg/dL — ABNORMAL LOW (ref 8.9–10.3)
Chloride: 98 mmol/L (ref 98–111)
Creatinine, Ser: 0.82 mg/dL (ref 0.44–1.00)
GFR, Estimated: >60 mL/min (ref >60)
Glucose, Bld: 96 mg/dL (ref 70–99)
Potassium: 4.1 mmol/L (ref 3.5–5.1)
Sodium: 127 mmol/L — ABNORMAL LOW (ref 135–145)

## 2023-11-03 LAB — HEMOGLOBIN AND HEMATOCRIT, BLOOD
HCT: 26.7 % — ABNORMAL LOW (ref 36.0–46.0)
Hemoglobin: 8.5 g/dL — ABNORMAL LOW (ref 12.0–15.0)

## 2023-11-03 MED ORDER — CLOPIDOGREL BISULFATE 75 MG PO TABS
75.0000 mg | ORAL_TABLET | Freq: Every day | ORAL | 0 refills | Status: DC
  Filled 2023-11-03: qty 30, 30d supply, fill #0

## 2023-11-03 MED ORDER — FUROSEMIDE 20 MG PO TABS
20.0000 mg | ORAL_TABLET | Freq: Every day | ORAL | 0 refills | Status: DC
  Filled 2023-11-03: qty 30, 30d supply, fill #0

## 2023-11-03 MED ORDER — PANTOPRAZOLE SODIUM 40 MG PO TBEC
40.0000 mg | DELAYED_RELEASE_TABLET | Freq: Two times a day (BID) | ORAL | 0 refills | Status: DC
  Filled 2023-11-03: qty 60, 30d supply, fill #0

## 2023-11-03 NOTE — Plan of Care (Signed)
  Problem: RH Balance Goal: LTG Patient will maintain dynamic standing with ADLs (OT) Description: LTG:  Patient will maintain dynamic standing balance with assist during activities of daily living (OT)  Outcome: Completed/Met   Problem: Sit to Stand Goal: LTG:  Patient will perform sit to stand in prep for activites of daily living with assistance level (OT) Description: LTG:  Patient will perform sit to stand in prep for activites of daily living with assistance level (OT) Outcome: Completed/Met   Problem: RH Grooming Goal: LTG Patient will perform grooming w/assist,cues/equip (OT) Description: LTG: Patient will perform grooming with assist, with/without cues using equipment (OT) Outcome: Completed/Met   Problem: RH Bathing Goal: LTG Patient will bathe all body parts with assist levels (OT) Description: LTG: Patient will bathe all body parts with assist levels (OT) Outcome: Completed/Met   Problem: RH Dressing Goal: LTG Patient will perform upper body dressing (OT) Description: LTG Patient will perform upper body dressing with assist, with/without cues (OT). Outcome: Completed/Met Goal: LTG Patient will perform lower body dressing w/assist (OT) Description: LTG: Patient will perform lower body dressing with assist, with/without cues in positioning using equipment (OT) Outcome: Completed/Met   Problem: RH Toileting Goal: LTG Patient will perform toileting task (3/3 steps) with assistance level (OT) Description: LTG: Patient will perform toileting task (3/3 steps) with assistance level (OT)  Outcome: Completed/Met   Problem: RH Toilet Transfers Goal: LTG Patient will perform toilet transfers w/assist (OT) Description: LTG: Patient will perform toilet transfers with assist, with/without cues using equipment (OT) Outcome: Completed/Met   Problem: RH Tub/Shower Transfers Goal: LTG Patient will perform tub/shower transfers w/assist (OT) Description: LTG: Patient will perform  tub/shower transfers with assist, with/without cues using equipment (OT) Outcome: Completed/Met   Problem: RH Simple Meal Prep Goal: LTG Patient will perform simple meal prep w/assist (OT) Description: LTG: Patient will perform simple meal prep with assistance, with/without cues (OT). Outcome: Completed/Met

## 2023-11-03 NOTE — Progress Notes (Signed)
 PROGRESS NOTE   Subjective/Complaints: Pt in bed. Feeling well! Pleased to tell me she had a normal appearing bm this morning!  Anxious to get home. Son at bedside  ROS: Patient denies fever, rash, sore throat, blurred vision, dizziness, nausea, vomiting, diarrhea, cough, shortness of breath or chest pain, joint or back/neck pain, headache, or mood change.    Objective:   No results found. Recent Labs    11/01/23 0954 11/02/23 0501 11/03/23 0524  WBC 7.9 6.6  --   HGB 9.1* 8.2* 8.5*  HCT 29.5* 26.3* 26.7*  PLT 347 339  --    Recent Labs    11/02/23 0501 11/03/23 0524  NA 127* 127*  K 4.6 4.1  CL 101 98  CO2 22 25  GLUCOSE 92 96  BUN 12 13  CREATININE 0.78 0.82  CALCIUM 8.1* 8.4*    Intake/Output Summary (Last 24 hours) at 11/03/2023 0841 Last data filed at 11/03/2023 1610 Gross per 24 hour  Intake 716 ml  Output 200 ml  Net 516 ml     Pressure Injury 10/26/23 Coccyx Mid Stage 1 -  Intact skin with non-blanchable redness of a localized area usually over a bony prominence. (Active)  10/26/23 1220  Location: Coccyx  Location Orientation: Mid  Staging: Stage 1 -  Intact skin with non-blanchable redness of a localized area usually over a bony prominence.  Wound Description (Comments):   Present on Admission: Yes    Physical Exam: Vital Signs Blood pressure 116/61, pulse 77, temperature 98 F (36.7 C), temperature source Oral, resp. rate 16, height 5\' 2"  (1.575 m), weight 41.4 kg, SpO2 98%.  Constitutional: No distress . Vital signs reviewed. HEENT: NCAT, EOMI, oral membranes moist Neck: supple Cardiovascular: IRR. No JVD    Respiratory/Chest: CTA Bilaterally without wheezes or rales. Normal effort    GI/Abdomen: BS +, non-tender, non-distended Ext: no clubbing, cyanosis, or edema Psych: pleasant and cooperative as always Skin: Clean and intact without signs of breakdown. A few bruises Neuro:   alert  and oriented x 3.  HOH can hear with hearing aids. Mild left central VII has improved. Speech clear. MMT: RUE and RLE 4+ to 5/5 throughout. LUE 4 to 4+/5. LLE 4+ to 5-/5.Sensory exam normal for light touch and pain in all 4 limbs. No limb ataxia or cerebellar signs. No abnormal tone appreciated.  Prior neuro assessment is c/w today's exam 11/03/2023.  Musculoskeletal: Full ROM, No pain with AROM or PROM in the neck, trunk, or extremities. Posture appropriate    Assessment/Plan: 1. Functional deficits which require 3+ hours per day of interdisciplinary therapy in a comprehensive inpatient rehab setting. Physiatrist is providing close team supervision and 24 hour management of active medical problems listed below. Physiatrist and rehab team continue to assess barriers to discharge/monitor patient progress toward functional and medical goals  Care Tool:  Bathing    Body parts bathed by patient: Right arm, Left arm, Chest, Abdomen, Front perineal area, Buttocks, Right upper leg, Left upper leg, Right lower leg, Left lower leg, Face         Bathing assist Assist Level: Independent with assistive device     Upper Body Dressing/Undressing  Upper body dressing   What is the patient wearing?: Bra, Pull over shirt    Upper body assist Assist Level: Independent    Lower Body Dressing/Undressing Lower body dressing      What is the patient wearing?: Underwear/pull up, Pants     Lower body assist Assist for lower body dressing: Independent with assitive device     Toileting Toileting    Toileting assist Assist for toileting: Independent with assistive device     Transfers Chair/bed transfer  Transfers assist     Chair/bed transfer assist level: Independent with assistive device     Locomotion Ambulation   Ambulation assist      Assist level: Independent with assistive device Assistive device: Walker-rolling Max distance: 200   Walk 10 feet activity   Assist      Assist level: Independent with assistive device Assistive device: Walker-rolling   Walk 50 feet activity   Assist    Assist level: Independent with assistive device Assistive device: Walker-rolling    Walk 150 feet activity   Assist    Assist level: Independent with assistive device Assistive device: Walker-rolling    Walk 10 feet on uneven surface  activity   Assist     Assist level: Independent with assistive device Assistive device: Rollator   Wheelchair     Assist Is the patient using a wheelchair?: No   Wheelchair activity did not occur: N/A         Wheelchair 50 feet with 2 turns activity    Assist    Wheelchair 50 feet with 2 turns activity did not occur: N/A       Wheelchair 150 feet activity     Assist  Wheelchair 150 feet activity did not occur: N/A       Blood pressure 116/61, pulse 77, temperature 98 F (36.7 C), temperature source Oral, resp. rate 16, height 5\' 2"  (1.575 m), weight 41.4 kg, SpO2 98%.  Medical Problem List and Plan: 1. Functional deficits secondary to right MCA territory infarction involving the right insula and overlying frontal parietal lobes/occlusion of distal M2 branch of the right MCA             -patient may  shower             -ELOS/Goals:  DC home 5/29 potentially as long as there are no medical setbacks -Continue CIR therapies including PT and OT. Interdisciplinary team conference today to discuss goals, barriers to discharge, and dc planning.  2.  Antithrombotics: -DVT/anticoagulation:  Mechanical: Antiembolism stockings, thigh (TED hose) Bilateral lower extremities             -antiplatelet therapy:  -5/28 -resumed asa 5/26 and  plavix  5/27 per GI  -no issues yet 3. Pain Management: Tylenol  as needed 4. Mood/Behavior/Sleep: Xanax  0.125-0.25 mg daily as needed, melatonin 3 mg nightly as needed             -antipsychotic agents: N/A 5. Neuropsych/cognition: This patient is capable of making  decisions on her own behalf. 6. Skin/Wound Care: Routine skin checks 7. Fluids/Electrolytes/Nutrition: continue to encourage po intake  5/28 -still hyponatremic 127   -suspect this has been chronic           -potassium normal           -continue potassium supp, lasix  per cards           -f/u as outpt 8.  Atrial fibrillation/Cards.  Followed by cardiology service Dr. Londa Rival.  Anticoagulation recently  discontinued due to GI bleed.  Toprol -XL 50 mg daily.   -Currently heart rate controlled -5/23 -holding lasix  (40mg  every other day Th and 60mh every other day Wed) given #7  5/28- weights up  today-question accuracy     -appreciate cards f/u. Lasix  resumed at 20mg  daily and farxiga  restarted. F/u labs in a week per recs Filed Weights   10/31/23 1131 11/01/23 0533 11/03/23 0416  Weight: 38.7 kg 38.4 kg 41.4 kg       9.  Type 2 diabetes mellitus.  Hemoglobin A1c 5.2.  SSI   11.  Hypothyroidism:  Continue Synthroid   12.  Hypertension.  continue-XL 75 mg daily, Lasix  40 mg every 48 hours alternating with 60 mg every 48 hours   13.  Constipation: continue Colace 100 mg twice daily, Metamucil 1 packet twice daily.  14.  UGI bleed/acute on chronic anemia:  Recent admission 09/22/2023 followed by Dr. Alita Irwin.  Prior endoscopy and colonoscopy showed gastritis, H. pylori, diverticulosis and hemorrhoids.  -on FeSO4     - HGB stabilizing  5/26-started PPI--increase to bid  -appreciate GI consult, endoscopy--angioectasia in duodenum   -APC performed   -resume asa today, resume plavix  tomorrow  5/28- hgb 8.5, stools brown   -recheck once more tomorrow              -f/u as otupt  LOS: 8 days A FACE TO FACE EVALUATION WAS PERFORMED  Rawland Caddy 11/03/2023, 8:41 AM

## 2023-11-03 NOTE — Progress Notes (Signed)
 Occupational Therapy Discharge Summary  Patient Details  Name: Shelly Fields MRN: [id number] Date of Birth: 09-Mar-1935  Date of Discharge from OT service:Nov 03, 2023  Patient has met 10 of 10 long term goals due to improved activity tolerance, improved balance, ability to compensate for deficits, and functional use of  LEFT lower extremity.  Patient to discharge at overall Modified Independent level.  Patient's care partner is independent to provide the necessary physical assistance at discharge.  Family education completed with numerous family members.  Reasons goals not met: n/a  Recommendation:  Patient will benefit from ongoing skilled OT services in home health setting to continue to advance functional skills in the area of iADL.  Equipment: No equipment provided  Reasons for discharge: treatment goals met  Patient/family agrees with progress made and goals achieved: Yes  OT Discharge Precautions/Restrictions  Restrictions Weight Bearing Restrictions Per Provider Order: No    ADL ADL Eating: Independent Grooming: Independent Where Assessed-Grooming: Standing at sink Upper Body Bathing: Modified independent Where Assessed-Upper Body Bathing: Shower Lower Body Bathing: Modified independent Where Assessed-Lower Body Bathing: Shower, Chair Upper Body Dressing: Independent Where Assessed-Upper Body Dressing: Edge of bed Lower Body Dressing: Modified independent Where Assessed-Lower Body Dressing: Edge of bed Toileting: Modified independent Where Assessed-Toileting: Teacher, adult education: Engineer, agricultural Method: Proofreader: Engineer, technical sales: Close supervison Web designer Method: Ship broker: Acupuncturist: Close supervision Film/video editor Method: Designer, industrial/product: Grab bars, Information systems manager with back Vision Baseline Vision/History: 0  No visual deficits Patient Visual Report: No change from baseline Vision Assessment?: No apparent visual deficits Perception  Perception: Within Functional Limits Praxis Praxis: WFL Cognition Cognition Overall Cognitive Status: Within Functional Limits for tasks assessed Awareness: Appears intact Problem Solving: Appears intact Reasoning: Appears intact Brief Interview for Mental Status (BIMS) Repetition of Three Words (First Attempt): 3 Temporal Orientation: Year: Correct Temporal Orientation: Month: Accurate within 5 days Temporal Orientation: Day: Correct Recall: "Sock": Yes, no cue required Recall: "Blue": Yes, no cue required Recall: "Bed": Yes, no cue required BIMS Summary Score: 15 Sensation  WFL Motor   WFL Mobility    Mod ind with RW in the home Balance Static Sitting Balance Static Sitting - Level of Assistance: 7: Independent Dynamic Sitting Balance Dynamic Sitting - Level of Assistance: 7: Independent Static Standing Balance Static Standing - Level of Assistance: 6: Modified independent (Device/Increase time) Dynamic Standing Balance Dynamic Standing - Level of Assistance: 6: Modified independent (Device/Increase time) Extremity/Trunk Assessment RUE Assessment RUE Assessment: Within Functional Limits LUE Assessment LUE Assessment: Within Functional Limits   [name] 11/03/2023, 10:29 AM

## 2023-11-03 NOTE — Progress Notes (Signed)
 Physical Therapy Session Note  Patient Details  Name: Shelly Fields MRN: [id number] Date of Birth: [birth year]  Today's Date: 11/03/2023 PT Individual Time: 4098-1191 PT Individual Time Calculation (min): 26 min   Short Term Goals: Week 1:  PT Short Term Goal 1 (Week 1): STG = LTG d/t ELOS  Skilled Therapeutic Interventions/Progress Updates: Patient sitting up in bed with family/covering physician present on entrance to room. Patient alert and agreeable to PT session.   Pt reported no pain. Session focused on going over HEP, safety at home (wearing grip socks/grip shoes when OOB, pt and pt family confidence in pt's ability to perform floor transfers, grab bars, importance of HHPT to help with increasing functional independence, and using AD when ambulating at home vs without AD). See d/c summary. Pt performed below exercises EOB (instructed to have rollator/stable surface in front of pt when doing sit<>stands for safety).  Access Code: [email or web address] URL: [email or web address] Date: 11/03/2023 Prepared by: Seferino Dade  Exercises - Sit to Stand with Arms Crossed  - 1 x daily - 7 x weekly - 3 sets - 10 reps - Seated Hip Abduction with Resistance  - 1 x daily - 7 x weekly - 3 sets - 10 reps - Seated Knee Lifts with Resistance  - 1 x daily - 7 x weekly - 3 sets - 10 reps  Patient sitting EOB at end of session with brakes locked, modI in room, and all needs within reach.      Therapy Documentation Precautions:  Precautions Precautions: Fall Precaution/Restrictions Comments: mild L hemipareisis Restrictions Weight Bearing Restrictions Per Provider Order: No  Therapy/Group: Individual Therapy  Tamarius Rosenfield PTA 11/03/2023, 11:32 AM

## 2023-11-03 NOTE — Progress Notes (Signed)
 Physical Therapy Discharge Summary  Patient Details  Name: Shelly Fields MRN: [id number] Date of Birth: [birth year]  Date of Discharge from PT service:Nov 03, 2023  Patient has met 8 of 8 long term goals due to {due WU:9811914}.  Patient to discharge at [facility] level {LOA:3049010}.   Patient's care partner {care partner:3041650} to provide the necessary {assistance:3041652} assistance at discharge.  Reasons goals not met: ***  Recommendation:  Patient will benefit from ongoing skilled PT services in {setting:3041680} to continue to advance safe functional mobility, address ongoing impairments in ***, and minimize fall risk.  Equipment: RW  Reasons for discharge: {Reason for discharge:3049018}  Patient/family agrees with progress made and goals achieved: {Pt/Family agree with progress/goals:3049020}  PT Discharge Precautions/Restrictions Precautions Precautions: Fall Restrictions Weight Bearing Restrictions Per Provider Order: No Pain Pain Assessment Pain Scale: 0-10 Pain Score: 0-No pain Pain Interference Pain Interference Pain Effect on Sleep: 1. Rarely or not at all Pain Interference with Therapy Activities: 1. Rarely or not at all Pain Interference with Day-to-Day Activities: 1. Rarely or not at all Vision/Perception  Vision - History Ability to See in Adequate Light: 1 Impaired Perception Perception: Within Functional Limits Praxis Praxis: WFL  Cognition Overall Cognitive Status: Within Functional Limits for tasks assessed Arousal/Alertness: Awake/alert Orientation Level: Oriented X4 Attention: Sustained Focused Attention: Appears intact Sustained Attention: Appears intact Memory: Appears intact Awareness: Appears intact Problem Solving: Appears intact Reasoning: Appears intact Safety/Judgment: Appears intact Sensation Sensation Light Touch: Appears Intact Hot/Cold: Appears Intact Proprioception: Appears Intact Coordination Gross Motor  Movements are Fluid and Coordinated: Yes Motor  Motor Motor: Other (comment) (mild L hemiparesis that has improved since evaluation) Motor - Discharge Observations: General weakenss: LLE>RLE  Mobility Bed Mobility Bed Mobility: Supine to Sit;Sit to Supine Supine to Sit: Independent Sit to Supine: Independent Transfers Transfers: Sit to Stand;Stand to Sit;Stand Pivot Transfers Sit to Stand: Independent Stand to Sit: Independent Stand Pivot Transfers: Independent with assistive device Transfer (Assistive device): Rollator Locomotion  Gait Ambulation: Yes Gait Assistance: Supervision/Verbal cueing Gait Distance (Feet): 892 Feet Assistive device: Rollator Gait Gait: Yes Gait Pattern: Impaired Gait Pattern: Step-through pattern;Poor foot clearance - left (Greater deficit on non-compliant surfaces) Stairs / Additional Locomotion Stairs: Yes Stairs Assistance: Supervision/Verbal cueing Stair Management Technique: Two rails;Step to pattern Number of Stairs: 12 Height of Stairs: 6 Curb: Supervision/Verbal cueing Wheelchair Mobility Wheelchair Mobility: No  Trunk/Postural Assessment  Cervical Assessment Cervical Assessment: Within Functional Limits Thoracic Assessment Thoracic Assessment: Within Functional Limits Lumbar Assessment Lumbar Assessment: Exceptions to [facility] (Posterior pelvic tilt in standing) Postural Control Postural Control: Within Functional Limits  Balance Balance Balance Assessed: Yes Static Sitting Balance Static Sitting - Balance Support: No upper extremity supported;Feet supported Static Sitting - Level of Assistance: 7: Independent Dynamic Sitting Balance Dynamic Sitting - Balance Support: No upper extremity supported;Feet supported Dynamic Sitting - Level of Assistance: 7: Independent Static Standing Balance Static Standing - Balance Support: Bilateral upper extremity supported Static Standing - Level of Assistance: 6: Modified independent  (Device/Increase time) Dynamic Standing Balance Dynamic Standing - Balance Support: During functional activity;Left upper extremity supported Dynamic Standing - Level of Assistance: 6: Modified independent (Device/Increase time) Dynamic Standing - Balance Activities: Reaching across midline Extremity Assessment  RUE Assessment RUE Assessment: Within Functional Limits LUE Assessment LUE Assessment: Within Functional Limits RLE Assessment RLE Assessment: Within Functional Limits LLE Assessment LLE Assessment: Within Functional Limits General Strength Comments: Grossly 4/5   Shelly Fields Shelly Fields 11/03/2023, 12:09 PM

## 2023-11-03 NOTE — Progress Notes (Signed)
 Occupational Therapy Session Note  Patient Details  Name: Shelly Fields MRN: [id number] Date of Birth: 1934/11/25  Today's Date: 11/03/2023 OT Individual Time: 0930-1000 OT Individual Time Calculation (min): 30 min    Short Term Goals: Week 1:  OT Short Term Goal 1 (Week 1): LTG=STG overall mod I goals OT Short Term Goal 2 (Week 1): Pt will perform simple meal prep activity with AD with mod I  Skilled Therapeutic Interventions/Progress Updates:    Pt received in room and ready for a shower. Pt ambulated around room with RW with no assistance needed to gather clothing, supplies for shower.  Pt toileted independently and then stepped into shower with distant supervision.   Pt completed shower and then dressed and dried hair standing at sink all without assistance using safe strategies. Pt is now mod Ind in the room.    Therapy Documentation Precautions:  Precautions Precautions: Fall Precaution/Restrictions Comments: mild L hemipareisis Restrictions Weight Bearing Restrictions Per Provider Order: No    Vital Signs: Therapy Vitals Pulse Rate: 76 BP: 121/69 Patient Position (if appropriate): Lying Oxygen Therapy SpO2: 100 % O2 Device: Room Air Pain: Pain Assessment Pain Scale: 0-10 Pain Score: 0-No pain ADL: ADL Eating: Independent Grooming: Independent Where Assessed-Grooming: Standing at sink Upper Body Bathing: Modified independent Where Assessed-Upper Body Bathing: Shower Lower Body Bathing: Modified independent Where Assessed-Lower Body Bathing: Shower, Chair Upper Body Dressing: Independent Where Assessed-Upper Body Dressing: Edge of bed Lower Body Dressing: Modified independent Where Assessed-Lower Body Dressing: Edge of bed Toileting: Modified independent Where Assessed-Toileting: Teacher, adult education: Engineer, agricultural Method: Proofreader: Engineer, technical sales: Close supervison Web designer  Method: Ship broker: Acupuncturist: Close supervision Film/video editor Method: Designer, industrial/product: Grab bars, Shower seat with back   Therapy/Group: Individual Therapy  [name] 11/03/2023, 10:27 AM

## 2023-11-03 NOTE — Patient Care Conference (Signed)
 Inpatient RehabilitationTeam Conference and Plan of Care Update Date: 11/03/2023   Time: 10/19 AM    Patient Name: Shelly Fields      Medical Record Number: [id number]  Date of Birth: 1935-01-21 Sex: Female         Room/Bed: 4W22C/4W22C-01 Payor Info: Payor: Advertising copywriter MEDICARE / Plan: [employer] MEDICARE / Product Type: *No Product type* /    Admit Date/Time:  10/26/2023 12:08 PM  Primary Diagnosis:  Right middle cerebral artery stroke [facility])  Hospital Problems: Principal Problem:   Right middle cerebral artery stroke [facility]) Active Problems:   Gastrointestinal hemorrhage   Platelet inhibition due to Plavix    Pressure injury of skin   AVM (arteriovenous malformation) of duodenum, acquired with hemorrhage    Expected Discharge Date: Expected Discharge Date: 11/04/23  Team Members Present: Physician leading conference: Dr. Lylia Sand Social Worker Present: Norval Been, LCSW Nurse Present: Forrestine Ike, RN PT Present: Oma Bias, PT;Dominic Randa Burton, PTA OT Present: Kenda Paula, OT     Current Status/Progress Goal Weekly Team Focus  Bowel/Bladder   last bm 5/26--continent of bowel/bladder   pt to remain continent of B/B   assist with pt's ADLS qshift and during rounds    Swallow/Nutrition/ Hydration               ADL's   mod independent with basic self care,  supervision with tub transfers - at goal level.  Will continue to work on IADLs until ready medically for discharge.   overall mod ind except supervision with tub transfers.   IADL training, fam educ completed    Mobility   Overall modI household ambulation, supervision longer distance in rollator - at goal level.   ModI  Continue challenging dynamic standing balance, functional mobility without B UE support, safe navigation of household until medically ready to d/c.    Communication                Safety/Cognition/ Behavioral Observations               Pain   no complaints of  pain   pt to remain pain free   assess pt's pain qshift and prn    Skin   pt's skin is in intact   pt's skin to remain intact  assess pt's skin during rounds and prn      Discharge Planning:  Pt will discharge to home alone and children will come in to check in on her. [name] for HHPT/OT/side. SW will confirm there are no barriers to discharge.   Team Discussion: Patient doing well post right MCA CVA with set back with GI bleed and restart DOAC post EGD.   Patient on target to meet rehab goals: yes, met goals for discharge.  MD adjussted medications for lower extremity edema and monitoring Hgb level.  *See Care Plan and progress notes for long and short-term goals.   Revisions to Treatment Plan:  N/a   Teaching Needs: Safety, medications, dietary modification, transfers, toileting, etc.   Current Barriers to Discharge: Decreased caregiver support  Possible Resolutions to Barriers: Family education HH follow up services     Medical Summary Current Status: duodenal bleed from angioectesisa, cauterized chemically by GI. has received blood, hgb stabilized. off diuretics due to AKI--improved now.  Barriers to Discharge: Medical stability   Possible Resolutions to [employer] Focus: resumed asa and plavix , observing hgb and for any clinical sx of blood loss. cards consulted for optimal diuretic and CV regimen  Continued Need for Acute Rehabilitation Level of Care: The patient requires daily medical management by a physician with specialized training in physical medicine and rehabilitation for the following reasons: Direction of a multidisciplinary physical rehabilitation program to maximize functional independence : Yes Medical management of patient stability for increased activity during participation in an intensive rehabilitation regime.: Yes Analysis of laboratory values and/or radiology reports with any subsequent need for medication adjustment and/or  medical intervention. : Yes   I attest that I was present, lead the team conference, and concur with the assessment and plan of the team.   Forrestine Ike B 11/03/2023, 1:26 PM

## 2023-11-04 DIAGNOSIS — K31811 Angiodysplasia of stomach and duodenum with bleeding: Secondary | ICD-10-CM

## 2023-11-04 DIAGNOSIS — E871 Hypo-osmolality and hyponatremia: Secondary | ICD-10-CM

## 2023-11-04 LAB — HEMOGLOBIN AND HEMATOCRIT, BLOOD
HCT: 28.9 % — ABNORMAL LOW (ref 36.0–46.0)
Hemoglobin: 8.9 g/dL — ABNORMAL LOW (ref 12.0–15.0)

## 2023-11-04 NOTE — Progress Notes (Signed)
 Inpatient Rehabilitation Care Coordinator Discharge Note   Patient Details  Name: Shelly Fields MRN: [id number] Date of Birth: 1934/12/23   Discharge location: D/c to home  Length of Stay: 8 days  Discharge activity level: Mod I  Home/community participation: Limited  Patient response WU:JWJXBJ Literacy - How often do you need to have someone help you when you read instructions, pamphlets, or other written material from your doctor or pharmacy?: Rarely  Patient response YN:WGNFAO Isolation - How often do you feel lonely or isolated from those around you?: Never  Services provided included: MD, RD, PT, OT, SLP, RN, TR, Pharmacy, Neuropsych, SW, CM  Financial Services:  Field seismologist Utilized: Private Insurance [facility] Medicare  Choices offered to/list presented to: patient  Follow-up services arranged:  Home Health, DME Home Health Agency: [name] for HHPT/OT/aide/SN (lab draw next week Wednesday)    DME : [employer] for youth rolling walker    Patient response to transportation need: Is the patient able to respond to transportation needs?: Yes In the past 12 months, has lack of transportation kept you from medical appointments or from getting medications?: No In the past 12 months, has lack of transportation kept you from meetings, work, or from getting things needed for daily living?: No   Patient/Family verbalized understanding of follow-up arrangements:  Yes  Individual responsible for coordination of the follow-up plan: contact pt or pt son Shelly Fields  Confirmed correct DME delivered: Rennis Case 11/04/2023    Comments (or additional information):fam edu completed  Summary of Stay    Date/Time Discharge Planning CSW  11/03/23 1022 Pt will discharge to home alone and children will come in to check in on her. [name] for HHPT/OT/side. SW will confirm there are no barriers to discharge. AAC  10/26/23 2055 Eval pending; PTA lived alone nextdoor  to son Shelly Fields; 1 level, 1 ste no rails; family to provide 24/7 supervision DBS       Illeana Edick A Brendolyn Callas

## 2023-11-04 NOTE — Progress Notes (Signed)
 Inpatient Rehabilitation Discharge Medication Review by a Pharmacist  A complete drug regimen review was completed for this patient to identify any potential clinically significant medication issues.  High Risk Drug Classes Is patient taking? Indication by Medication  Antipsychotic No   Anticoagulant No   Antibiotic No   Opioid No   Antiplatelet Yes Aspirin , Plavix : CVA ppx x 90 days, then aspirin  alone  Hypoglycemics/insulin  Yes Farxiga : DM  Vasoactive Medication Yes Toprol , Lasix : AFib, HTN, edema  Chemotherapy No   Other Yes Alprazolam : anxiety Prolia : Senile osteoporosis   Levothyroxine : hypothyroid Protonix : GERD KCl: supplement     Type of Medication Issue Identified Description of Issue Recommendation(s)  Drug Interaction(s) (clinically significant)     Duplicate Therapy     Allergy      No Medication Administration End Date     Incorrect Dose     Additional Drug Therapy Needed     Significant med changes from prior encounter (inform family/care partners about these prior to discharge). Currently off Xarelto  due to recent concern for blood loss anemia    Other       Clinically significant medication issues were identified that warrant physician communication and completion of prescribed/recommended actions by midnight of the next day:  No  Name of provider notified for urgent issues identified:   Provider Method of Notification:     Pharmacist comments:   Time spent performing this drug regimen review (minutes):  30

## 2023-11-04 NOTE — Progress Notes (Signed)
 Inpatient Rehabilitation Discharge Medication Review by a Pharmacist  A complete drug regimen review was completed for this patient to identify any potential clinically significant medication issues.  High Risk Drug Classes Is patient taking? Indication by Medication  Antipsychotic No   Anticoagulant No   Antibiotic No   Opioid No   Antiplatelet Yes Aspirin , Plavix : CVA ppx x 90 days, then aspirin  alone  Hypoglycemics/insulin  Yes Farxiga : DM  Vasoactive Medication Yes Toprol , Lasix : AFib, HTN, edema  Chemotherapy No   Other Yes Alprazolam : anxiety Prolia : Senile osteoporosis   Levothyroxine : hypothyroid Protonix : GERD KCl: supplement     Type of Medication Issue Identified Description of Issue Recommendation(s)  Drug Interaction(s) (clinically significant)     Duplicate Therapy     Allergy      No Medication Administration End Date     Incorrect Dose     Additional Drug Therapy Needed     Significant med changes from prior encounter (inform family/care partners about these prior to discharge). Currently off Xarelto  due to recent concern for blood loss anemia    Other       Clinically significant medication issues were identified that warrant physician communication and completion of prescribed/recommended actions by midnight of the next day:  No  Name of provider notified for urgent issues identified:   Provider Method of Notification:     Pharmacist comments:   Time spent performing this drug regimen review (minutes):  30   Lennice Quivers, PharmD

## 2023-11-04 NOTE — Plan of Care (Signed)
?  Problem: Education: ?Goal: Knowledge of General Education information will improve ?Description: Including pain rating scale, medication(s)/side effects and non-pharmacologic comfort measures ?Outcome: Progressing ?  ?Problem: Health Behavior/Discharge Planning: ?Goal: Ability to manage health-related needs will improve ?Outcome: Progressing ?  ?Problem: Coping: ?Goal: Level of anxiety will decrease ?Outcome: Progressing ?  ?Problem: Elimination: ?Goal: Will not experience complications related to bowel motility ?Outcome: Progressing ?Goal: Will not experience complications related to urinary retention ?Outcome: Progressing ?  ?Problem: Safety: ?Goal: Ability to remain free from injury will improve ?Outcome: Progressing ?  ?Problem: Skin Integrity: ?Goal: Risk for impaired skin integrity will decrease ?Outcome: Progressing ?  ?

## 2023-11-04 NOTE — Progress Notes (Signed)
 PROGRESS NOTE   Subjective/Complaints: Pt taking meds with RN. No new issues today. Potassium pill is large and takes extra effort to swallow  ROS: Patient denies fever, rash, sore throat, blurred vision, dizziness, nausea, vomiting, diarrhea, cough, shortness of breath or chest pain, joint or back/neck pain, headache, or mood change.    Objective:   No results found. Recent Labs    11/01/23 0954 11/02/23 0501 11/03/23 0524 11/04/23 0709  WBC 7.9 6.6  --   --   HGB 9.1* 8.2* 8.5* 8.9*  HCT 29.5* 26.3* 26.7* 28.9*  PLT 347 339  --   --    Recent Labs    11/02/23 0501 11/03/23 0524  NA 127* 127*  K 4.6 4.1  CL 101 98  CO2 22 25  GLUCOSE 92 96  BUN 12 13  CREATININE 0.78 0.82  CALCIUM 8.1* 8.4*    Intake/Output Summary (Last 24 hours) at 11/04/2023 0826 Last data filed at 11/04/2023 0800 Gross per 24 hour  Intake 706 ml  Output 500 ml  Net 206 ml     Pressure Injury 10/26/23 Coccyx Mid Stage 1 -  Intact skin with non-blanchable redness of a localized area usually over a bony prominence. (Active)  10/26/23 1220  Location: Coccyx  Location Orientation: Mid  Staging: Stage 1 -  Intact skin with non-blanchable redness of a localized area usually over a bony prominence.  Wound Description (Comments):   Present on Admission: Yes    Physical Exam: Vital Signs Blood pressure (!) 121/57, pulse 87, temperature 98.1 F (36.7 C), temperature source Oral, resp. rate 16, height 5\' 2"  (1.575 m), weight 40.1 kg, SpO2 98%.  Constitutional: No distress . Vital signs reviewed. HEENT: NCAT, EOMI, oral membranes moist Neck: supple Cardiovascular: IRR IRR No JVD    Respiratory/Chest: CTA Bilaterally without wheezes or rales. Normal effort    GI/Abdomen: BS +, non-tender, non-distended Ext: no clubbing, cyanosis, or edema Psych: pleasant and cooperative  Skin: Clean and intact without signs of breakdown. A few  bruises Neuro:   alert and oriented x 3.  HOH can hear with hearing aids. Mild left central VII has improved. Speech clear. MMT: RUE and RLE 4+ to 5/5 throughout. LUE 4 to 4+/5. LLE 4+ to 5-/5.Sensory exam normal for light touch and pain in all 4 limbs. No limb ataxia or cerebellar signs. No abnormal tone appreciated.  Prior neuro assessment is c/w today's exam 11/04/2023.  Musculoskeletal: Full ROM, No pain with AROM or PROM in the neck, trunk, or extremities. Posture appropriate    Assessment/Plan: 1. Functional deficits which require 3+ hours per day of interdisciplinary therapy in a comprehensive inpatient rehab setting. Physiatrist is providing close team supervision and 24 hour management of active medical problems listed below. Physiatrist and rehab team continue to assess barriers to discharge/monitor patient progress toward functional and medical goals  Care Tool:  Bathing    Body parts bathed by patient: Right arm, Left arm, Chest, Abdomen, Front perineal area, Buttocks, Right upper leg, Left upper leg, Right lower leg, Left lower leg, Face         Bathing assist Assist Level: Independent with assistive device  Upper Body Dressing/Undressing Upper body dressing   What is the patient wearing?: Bra, Pull over shirt    Upper body assist Assist Level: Independent    Lower Body Dressing/Undressing Lower body dressing      What is the patient wearing?: Underwear/pull up, Pants     Lower body assist Assist for lower body dressing: Independent with assitive device     Toileting Toileting    Toileting assist Assist for toileting: Independent with assistive device     Transfers Chair/bed transfer  Transfers assist     Chair/bed transfer assist level: Independent with assistive device     Locomotion Ambulation   Ambulation assist      Assist level: Supervision/Verbal cueing Assistive device: Rollator Max distance: 892   Walk 10 feet  activity   Assist     Assist level: Independent with assistive device Assistive device: Rollator   Walk 50 feet activity   Assist    Assist level: Independent with assistive device Assistive device: Rollator    Walk 150 feet activity   Assist    Assist level: Independent with assistive device Assistive device: Rollator    Walk 10 feet on uneven surface  activity   Assist     Assist level: Independent with assistive device Assistive device: Rollator   Wheelchair     Assist Is the patient using a wheelchair?: No   Wheelchair activity did not occur: N/A         Wheelchair 50 feet with 2 turns activity    Assist    Wheelchair 50 feet with 2 turns activity did not occur: N/A       Wheelchair 150 feet activity     Assist  Wheelchair 150 feet activity did not occur: N/A       Blood pressure (!) 121/57, pulse 87, temperature 98.1 F (36.7 C), temperature source Oral, resp. rate 16, height 5\' 2"  (1.575 m), weight 40.1 kg, SpO2 98%.  Medical Problem List and Plan: 1. Functional deficits secondary to right MCA territory infarction involving the right insula and overlying frontal parietal lobes/occlusion of distal M2 branch of the right MCA             -dc home today -f/u with me, neuro, cardiology, primary.  GI PRN  2.  Antithrombotics: -DVT/anticoagulation:  Mechanical: Antiembolism stockings, thigh (TED hose) Bilateral lower extremities             -antiplatelet therapy:  -5/28 -resumed asa 5/26 and  plavix  5/27 per GI  -no issues thus far 3. Pain Management: Tylenol  as needed 4. Mood/Behavior/Sleep: Xanax  0.125-0.25 mg daily as needed, melatonin 3 mg nightly as needed             -antipsychotic agents: N/A 5. Neuropsych/cognition: This patient is capable of making decisions on her own behalf. 6. Skin/Wound Care: Routine skin checks 7. Fluids/Electrolytes/Nutrition: continue to encourage po intake  5/28 -still hyponatremic  127   -suspect this has been chronic           -potassium normal           -continue potassium supp, lasix  per cards  5/29     -f/u labs as outpt 8.  Atrial fibrillation/Cards.  Followed by cardiology service Dr. Londa Rival.  Anticoagulation recently discontinued due to GI bleed.  Toprol -XL 50 mg daily.   -Currently heart rate controlled -5/23 -holding lasix  (40mg  every other day Th and 60mh every other day Wed) given #7  5/28- weights up  today-question  accuracy     -appreciate cards f/u. Lasix  resumed at 20mg  daily and farxiga  restarted. F/u labs next week per recs Filed Weights   11/01/23 0533 11/03/23 0416 11/04/23 0611  Weight: 38.4 kg 41.4 kg 40.1 kg       9.  Type 2 diabetes mellitus.  Hemoglobin A1c 5.2.  SSI   11.  Hypothyroidism:  Continue Synthroid   12.  Hypertension.  continue-XL 75 mg daily, Lasix  40 mg every 48 hours alternating with 60 mg every 48 hours   13.  Constipation: continue Colace 100 mg twice daily, Metamucil 1 packet twice daily.  14.  UGI bleed/acute on chronic anemia:  Recent admission 09/22/2023 followed by Dr. Alita Irwin.  Prior endoscopy and colonoscopy showed gastritis, H. pylori, diverticulosis and hemorrhoids.  -on FeSO4     - HGB stabilizing  5/26-started PPI--increase to bid  -appreciate GI consult, endoscopy--angioectasia in duodenum   -APC performed   -resume asa today, resume plavix  tomorrow  5/29- hgb up to 8.9, stools brown   -recheck next Wednesday   -PPI BID at home  LOS: 9 days A FACE TO FACE EVALUATION WAS PERFORMED  Rawland Caddy 11/04/2023, 8:26 AM

## 2023-11-04 NOTE — Progress Notes (Signed)
 Patient ID: Shelly Fields, female   DOB: 1935-04-01, 89 y.o.   MRN: [id number]  [phone]- Pt will d/c to home today. SW informed pt will need HHSN for labs- BMET and CBC sent to Dr. Rachel Budds in Outpatient Rehab clinic. SW left message for [name]/[name] [phone]).  1204-SW faxed HHSN order as no follow-up at this time.    Norval Been, MSW, LCSW Office: [phone] Cell: [phone] Fax: [phone]

## 2023-11-08 ENCOUNTER — Telehealth: Payer: Self-pay

## 2023-11-08 NOTE — Telephone Encounter (Signed)
 Transition Care Management Unsuccessful Follow-up Telephone Call  Date of discharge and from where:  11/02/2023 from [facility]  Attempts:  2 attempts made  Reason for unsuccessful TCM follow-up call:  Left voice message

## 2023-11-10 DIAGNOSIS — I5033 Acute on chronic diastolic (congestive) heart failure: Secondary | ICD-10-CM | POA: Diagnosis not present

## 2023-11-10 DIAGNOSIS — D62 Acute posthemorrhagic anemia: Secondary | ICD-10-CM | POA: Diagnosis not present

## 2023-11-10 DIAGNOSIS — E871 Hypo-osmolality and hyponatremia: Secondary | ICD-10-CM | POA: Diagnosis not present

## 2023-11-10 DIAGNOSIS — I110 Hypertensive heart disease with heart failure: Secondary | ICD-10-CM | POA: Diagnosis not present

## 2023-11-10 DIAGNOSIS — K2971 Gastritis, unspecified, with bleeding: Secondary | ICD-10-CM | POA: Diagnosis not present

## 2023-11-10 DIAGNOSIS — I4821 Permanent atrial fibrillation: Secondary | ICD-10-CM | POA: Diagnosis not present

## 2023-11-10 DIAGNOSIS — I63511 Cerebral infarction due to unspecified occlusion or stenosis of right middle cerebral artery: Secondary | ICD-10-CM | POA: Diagnosis not present

## 2023-11-10 DIAGNOSIS — E039 Hypothyroidism, unspecified: Secondary | ICD-10-CM | POA: Diagnosis not present

## 2023-11-10 DIAGNOSIS — J9601 Acute respiratory failure with hypoxia: Secondary | ICD-10-CM | POA: Diagnosis not present

## 2023-11-10 DIAGNOSIS — R339 Retention of urine, unspecified: Secondary | ICD-10-CM | POA: Diagnosis not present

## 2023-11-10 DIAGNOSIS — M810 Age-related osteoporosis without current pathological fracture: Secondary | ICD-10-CM | POA: Diagnosis not present

## 2023-11-10 DIAGNOSIS — K2991 Gastroduodenitis, unspecified, with bleeding: Secondary | ICD-10-CM | POA: Diagnosis not present

## 2023-11-10 DIAGNOSIS — C44722 Squamous cell carcinoma of skin of right lower limb, including hip: Secondary | ICD-10-CM | POA: Diagnosis not present

## 2023-11-10 DIAGNOSIS — D509 Iron deficiency anemia, unspecified: Secondary | ICD-10-CM | POA: Diagnosis not present

## 2023-11-10 DIAGNOSIS — I272 Pulmonary hypertension, unspecified: Secondary | ICD-10-CM | POA: Diagnosis not present

## 2023-11-10 DIAGNOSIS — N179 Acute kidney failure, unspecified: Secondary | ICD-10-CM | POA: Diagnosis not present

## 2023-11-10 DIAGNOSIS — I50810 Right heart failure, unspecified: Secondary | ICD-10-CM | POA: Diagnosis not present

## 2023-11-10 DIAGNOSIS — I2781 Cor pulmonale (chronic): Secondary | ICD-10-CM | POA: Diagnosis not present

## 2023-11-10 DIAGNOSIS — Z556 Problems related to health literacy: Secondary | ICD-10-CM | POA: Diagnosis not present

## 2023-11-10 DIAGNOSIS — K643 Fourth degree hemorrhoids: Secondary | ICD-10-CM | POA: Diagnosis not present

## 2023-11-10 DIAGNOSIS — J470 Bronchiectasis with acute lower respiratory infection: Secondary | ICD-10-CM | POA: Diagnosis not present

## 2023-11-12 ENCOUNTER — Telehealth: Payer: Self-pay | Admitting: Cardiology

## 2023-11-12 NOTE — Telephone Encounter (Signed)
 Spoke with [name] son. He states that pt has gained 2 lbs overnight and left ankle is 2 in larger in size. Pt states that she is not feeling well. Denies SOB  and chest pain. No other complaints at this time. [name] reports that pt is taking Lasix  20 mg daily. Please advise.

## 2023-11-12 NOTE — Telephone Encounter (Signed)
 Pt c/o swelling/edema: STAT if pt has developed SOB within 24 hours  If swelling, where is the swelling located? Left leg  How much weight have you gained and in what time span? 2 lbs in 1 day  Have you gained 2 pounds in a day or 5 pounds in a week? yes  Do you have a log of your daily weights (if so, list)?   Are you currently taking a fluid pill? Yes, taking Furosemide  20 mg a day  Are you currently SOB? no  Have you traveled recently in a car or plane for an extended period of time? no

## 2023-11-12 NOTE — Telephone Encounter (Signed)
 Son [name]) returned RN's call.

## 2023-11-12 NOTE — Telephone Encounter (Signed)
Called patient. No answer. Left message to call back.  

## 2023-11-12 NOTE — Telephone Encounter (Signed)
 Returned call to patient's son [name]. Informed of Dr. Anselm Basset note to increase Lasix  to 40 mg for a few days then return to 20 mg daily. [name] voiced understanding.

## 2023-11-15 DIAGNOSIS — E039 Hypothyroidism, unspecified: Secondary | ICD-10-CM | POA: Diagnosis not present

## 2023-11-15 DIAGNOSIS — I50810 Right heart failure, unspecified: Secondary | ICD-10-CM | POA: Diagnosis not present

## 2023-11-15 DIAGNOSIS — E871 Hypo-osmolality and hyponatremia: Secondary | ICD-10-CM | POA: Diagnosis not present

## 2023-11-15 DIAGNOSIS — D62 Acute posthemorrhagic anemia: Secondary | ICD-10-CM | POA: Diagnosis not present

## 2023-11-15 DIAGNOSIS — Z556 Problems related to health literacy: Secondary | ICD-10-CM | POA: Diagnosis not present

## 2023-11-15 DIAGNOSIS — I5033 Acute on chronic diastolic (congestive) heart failure: Secondary | ICD-10-CM | POA: Diagnosis not present

## 2023-11-15 DIAGNOSIS — K643 Fourth degree hemorrhoids: Secondary | ICD-10-CM | POA: Diagnosis not present

## 2023-11-15 DIAGNOSIS — M810 Age-related osteoporosis without current pathological fracture: Secondary | ICD-10-CM | POA: Diagnosis not present

## 2023-11-15 DIAGNOSIS — I272 Pulmonary hypertension, unspecified: Secondary | ICD-10-CM | POA: Diagnosis not present

## 2023-11-15 DIAGNOSIS — K2971 Gastritis, unspecified, with bleeding: Secondary | ICD-10-CM | POA: Diagnosis not present

## 2023-11-15 DIAGNOSIS — J470 Bronchiectasis with acute lower respiratory infection: Secondary | ICD-10-CM | POA: Diagnosis not present

## 2023-11-15 DIAGNOSIS — J9601 Acute respiratory failure with hypoxia: Secondary | ICD-10-CM | POA: Diagnosis not present

## 2023-11-15 DIAGNOSIS — D509 Iron deficiency anemia, unspecified: Secondary | ICD-10-CM | POA: Diagnosis not present

## 2023-11-15 DIAGNOSIS — I4821 Permanent atrial fibrillation: Secondary | ICD-10-CM | POA: Diagnosis not present

## 2023-11-15 DIAGNOSIS — C44722 Squamous cell carcinoma of skin of right lower limb, including hip: Secondary | ICD-10-CM | POA: Diagnosis not present

## 2023-11-15 DIAGNOSIS — N179 Acute kidney failure, unspecified: Secondary | ICD-10-CM | POA: Diagnosis not present

## 2023-11-15 DIAGNOSIS — I110 Hypertensive heart disease with heart failure: Secondary | ICD-10-CM | POA: Diagnosis not present

## 2023-11-15 DIAGNOSIS — K2991 Gastroduodenitis, unspecified, with bleeding: Secondary | ICD-10-CM | POA: Diagnosis not present

## 2023-11-15 DIAGNOSIS — I2781 Cor pulmonale (chronic): Secondary | ICD-10-CM | POA: Diagnosis not present

## 2023-11-15 DIAGNOSIS — R339 Retention of urine, unspecified: Secondary | ICD-10-CM | POA: Diagnosis not present

## 2023-11-17 DIAGNOSIS — I50810 Right heart failure, unspecified: Secondary | ICD-10-CM | POA: Diagnosis not present

## 2023-11-17 DIAGNOSIS — J9601 Acute respiratory failure with hypoxia: Secondary | ICD-10-CM | POA: Diagnosis not present

## 2023-11-17 DIAGNOSIS — K2971 Gastritis, unspecified, with bleeding: Secondary | ICD-10-CM | POA: Diagnosis not present

## 2023-11-17 DIAGNOSIS — D509 Iron deficiency anemia, unspecified: Secondary | ICD-10-CM | POA: Diagnosis not present

## 2023-11-17 DIAGNOSIS — I272 Pulmonary hypertension, unspecified: Secondary | ICD-10-CM | POA: Diagnosis not present

## 2023-11-17 DIAGNOSIS — K2991 Gastroduodenitis, unspecified, with bleeding: Secondary | ICD-10-CM | POA: Diagnosis not present

## 2023-11-17 DIAGNOSIS — R339 Retention of urine, unspecified: Secondary | ICD-10-CM | POA: Diagnosis not present

## 2023-11-17 DIAGNOSIS — M810 Age-related osteoporosis without current pathological fracture: Secondary | ICD-10-CM | POA: Diagnosis not present

## 2023-11-17 DIAGNOSIS — E039 Hypothyroidism, unspecified: Secondary | ICD-10-CM | POA: Diagnosis not present

## 2023-11-17 DIAGNOSIS — I2781 Cor pulmonale (chronic): Secondary | ICD-10-CM | POA: Diagnosis not present

## 2023-11-17 DIAGNOSIS — N179 Acute kidney failure, unspecified: Secondary | ICD-10-CM | POA: Diagnosis not present

## 2023-11-17 DIAGNOSIS — J470 Bronchiectasis with acute lower respiratory infection: Secondary | ICD-10-CM | POA: Diagnosis not present

## 2023-11-17 DIAGNOSIS — I5033 Acute on chronic diastolic (congestive) heart failure: Secondary | ICD-10-CM | POA: Diagnosis not present

## 2023-11-17 DIAGNOSIS — E871 Hypo-osmolality and hyponatremia: Secondary | ICD-10-CM | POA: Diagnosis not present

## 2023-11-17 DIAGNOSIS — C44722 Squamous cell carcinoma of skin of right lower limb, including hip: Secondary | ICD-10-CM | POA: Diagnosis not present

## 2023-11-17 DIAGNOSIS — K643 Fourth degree hemorrhoids: Secondary | ICD-10-CM | POA: Diagnosis not present

## 2023-11-17 DIAGNOSIS — I110 Hypertensive heart disease with heart failure: Secondary | ICD-10-CM | POA: Diagnosis not present

## 2023-11-17 DIAGNOSIS — Z556 Problems related to health literacy: Secondary | ICD-10-CM | POA: Diagnosis not present

## 2023-11-17 DIAGNOSIS — D62 Acute posthemorrhagic anemia: Secondary | ICD-10-CM | POA: Diagnosis not present

## 2023-11-17 DIAGNOSIS — I4821 Permanent atrial fibrillation: Secondary | ICD-10-CM | POA: Diagnosis not present

## 2023-11-18 ENCOUNTER — Encounter: Payer: Self-pay | Admitting: Registered Nurse

## 2023-11-18 ENCOUNTER — Encounter: Attending: Registered Nurse | Admitting: Registered Nurse

## 2023-11-18 VITALS — BP 167/72 | HR 78 | Ht 62.0 in | Wt 98.0 lb

## 2023-11-18 DIAGNOSIS — I63511 Cerebral infarction due to unspecified occlusion or stenosis of right middle cerebral artery: Secondary | ICD-10-CM | POA: Diagnosis not present

## 2023-11-18 DIAGNOSIS — I480 Paroxysmal atrial fibrillation: Secondary | ICD-10-CM | POA: Insufficient documentation

## 2023-11-18 NOTE — Progress Notes (Signed)
 Subjective:    Patient ID: Shelly Fields, female    DOB: 1934/08/13, 89 y.o.   MRN: [id number]  HPI: Shelly Fields is a 89 y.o. female who is here for Transitional Care visit for follow up of her Right Middle Cerebral Artery Stroke and Paroxysmal Atrial Fibrillation. She arrived to [facility] on 10/21/2023, via EMS, she had sustained a fall.   Dr Manfred: 10/21/2023: H&P HPI: Shelly Fields is an 89 y.o. female with medical history significant of atrial fibrillation, hypothyroidism, T2DM who presents to the emergency department from home via EMS due to fall sustained at home around 10 PM last night.  Patient was unable to provide history, history was obtained from EDP and ED medical record.  Per report, patient lives alone and family checked on her this afternoon around 1:30 PM and was found on the floor.  Apparently, patient got out of bed and tried to use a walker, but she fell back onto a nightstand and had abrasions on back.  Family noted patient having left-sided weakness and dragging her left leg, so EMS was activated and patient was sent to the ED for further evaluation and management.   CTA:  IMPRESSION: 1. Occlusion of a distal M2 branch of the right MCA. 2. No hemodynamically significant stenosis of the carotid arteries. 3. Mild stenosis of the left vertebral artery origin due to atherosclerotic calcification.   Aortic Atherosclerosis (ICD10-I70.0).  MR: Brain:  IMPRESSION: Acute right MCA territory infarcts involving the right insula and overlying frontal and parietal lobes. No substantial mass effect.  She was admitted to inpatient rehabilitation on 10/26/2023 and discharged home on 11/02/2023. She is receiving Home Health with [facility].  She denies any pain. She rates her pain 0. Also reports she has a good appetite.    Pain Inventory Average Pain 0 Pain Right Now 0 My pain is .  LOCATION OF PAIN  na  BOWEL Number of stools per week: 7 Oral laxative  use Yes  Type of laxative metamacil Enema or suppository use No  History of colostomy No  Incontinent No   BLADDER Normal In and out cath, frequency . Able to self cath . Bladder incontinence No  Frequent urination No  Leakage with coughing No  Difficulty starting stream No  Incomplete bladder emptying No    Mobility walk without assistance use a walker ability to climb steps?  no do you drive?  no  Function I need assistance with the following:  shopping  Neuro/Psych No problems in this area  Prior Studies TC appt  Physicians involved in your care TC appt   Family History  Problem Relation Age of Onset   Heart disease Mother    Hypertension Father    Colon cancer Neg Hx    Social History   Socioeconomic History   Marital status: Widowed    Spouse name: Not on file   Number of children: Not on file   Years of education: Not on file   Highest education level: Not on file  Occupational History   Not on file  Tobacco Use   Smoking status: Never    Passive exposure: Past   Smokeless tobacco: Never  Vaping Use   Vaping status: Never Used  Substance and Sexual Activity   Alcohol use: No    Alcohol/week: 0.0 standard drinks of alcohol   Drug use: No   Sexual activity: Not on file  Other Topics Concern   Not on file  Social History Narrative   Not on file   Social Drivers of Health   Financial Resource Strain: Not on file  Food Insecurity: No Food Insecurity (10/21/2023)   Hunger Vital Sign    Worried About Running Out of Food in the Last Year: Never true    Ran Out of Food in the Last Year: Never true  Transportation Needs: No Transportation Needs (10/21/2023)   PRAPARE - Administrator, Civil Service (Medical): No    Lack of Transportation (Non-Medical): No  Physical Activity: Not on file  Stress: Not on file  Social Connections: Moderately Integrated (10/21/2023)   Social Connection and Isolation Panel    Frequency of Communication  with Friends and Family: More than three times a week    Frequency of Social Gatherings with Friends and Family: More than three times a week    Attends Religious Services: 1 to 4 times per year    Active Member of [employer] or Organizations: Patient declined    Attends Banker Meetings: 1 to 4 times per year    Marital Status: Widowed   Past Surgical History:  Procedure Laterality Date   ABDOMINAL HYSTERECTOMY     BIOPSY  03/25/2023   Procedure: BIOPSY;  Surgeon: Cinderella Deatrice FALCON, MD;  Location: [facility];  Service: Endoscopy;;   CATARACT EXTRACTION     COLONOSCOPY N/A 01/12/2013   Procedure: COLONOSCOPY;  Surgeon: Claudis RAYMOND Rivet, MD;  Location: [facility];  Service: Endoscopy;  Laterality: N/A;  1030   COLONOSCOPY N/A 11/18/2017   Procedure: COLONOSCOPY;  Surgeon: Rivet Claudis RAYMOND, MD;  Location: [facility];  Service: Endoscopy;  Laterality: N/A;   COLONOSCOPY WITH PROPOFOL  N/A 03/25/2023   Procedure: COLONOSCOPY WITH PROPOFOL ;  Surgeon: Cinderella Deatrice FALCON, MD;  Location: [facility];  Service: Endoscopy;  Laterality: N/A;  11:00AM;ASA 3   ENTEROSCOPY N/A 10/31/2023   Procedure: ENTEROSCOPY;  Surgeon: Stacia Glendia BRAVO, MD;  Location: [facility] ENDOSCOPY;  Service: Gastroenterology;  Laterality: N/A;   ESOPHAGOGASTRODUODENOSCOPY (EGD) WITH PROPOFOL  N/A 03/25/2023   Procedure: ESOPHAGOGASTRODUODENOSCOPY (EGD) WITH PROPOFOL ;  Surgeon: Cinderella Deatrice FALCON, MD;  Location: [facility];  Service: Endoscopy;  Laterality: N/A;  11:00;ASA 3   GIVENS CAPSULE STUDY N/A 10/29/2023   Procedure: IMAGING PROCEDURE, GI TRACT, INTRALUMINAL, VIA CAPSULE;  Surgeon: Stacia Glendia BRAVO, MD;  Location: [facility] ENDOSCOPY;  Service: Gastroenterology;  Laterality: N/A;   HOT HEMOSTASIS N/A 10/31/2023   Procedure: ARGON PLASMA COAGULATION;  Surgeon: Stacia Glendia BRAVO, MD;  Location: [facility] ENDOSCOPY;  Service: Gastroenterology;  Laterality: N/A;   LESION REMOVAL Right 09/13/2023   Procedure: excision of right  lower extremity squamous cell carcinoma;  Surgeon: Evonnie Dorothyann LABOR, DO;  Location: [facility];  Service: General;  Laterality: Right;   POLYPECTOMY  11/18/2017   Procedure: POLYPECTOMY;  Surgeon: Rivet Claudis RAYMOND, MD;  Location: [facility];  Service: Endoscopy;;  colon   Past Medical History:  Diagnosis Date   Cor pulmonale (chronic) (HCC)    Diverticulosis    Essential hypertension    Hypothyroidism    Internal hemorrhoids    Persistent atrial fibrillation (HCC)    Pulmonary hypertension (HCC)    Seasonal allergies    Shingles    BP (!) 170/80   Pulse 76   Ht 5' 2 (1.575 m)   Wt 98 lb (44.5 kg)   SpO2 99%   BMI 17.92 kg/m   Opioid Risk Score:   Fall Risk Score:  `  1  Depression screen PHQ 2/9     11/18/2023    1:40 PM  Depression screen PHQ 2/9  Decreased Interest 0  Down, Depressed, Hopeless 0  PHQ - 2 Score 0  Altered sleeping 1  Tired, decreased energy 1  Change in appetite 0  Feeling bad or failure about yourself  0  Trouble concentrating 0  Moving slowly or fidgety/restless 0  Suicidal thoughts 0  PHQ-9 Score 2  Difficult doing work/chores Not difficult at all     Review of Systems  All other systems reviewed and are negative.     Objective:   Physical Exam Vitals and nursing note reviewed.  Constitutional:      Appearance: Normal appearance.   Cardiovascular:     Rate and Rhythm: Normal rate and regular rhythm.     Pulses: Normal pulses.     Heart sounds: Normal heart sounds.  Pulmonary:     Effort: Pulmonary effort is normal.     Breath sounds: Normal breath sounds.   Musculoskeletal:        General: No signs of injury.     Right lower leg: Edema present.     Left lower leg: Edema present.     Comments: Normal Muscle Bulk and Muscle Testing Reveals:  Upper Extremities: Full ROM and Muscle Strength 5/5  Lower Extremities: Full ROM and Muscle Strength 5/5       Neurological:     Mental Status: She is alert and oriented to person,  place, and time.   Psychiatric:        Mood and Affect: Mood normal.        Behavior: Behavior normal.         Assessment & Plan:  Right Middle Cerebral Artery Stroke: Continue Home Health Therapy with [name] . She has a scheduled appointment with Neurology. Continue to Monitor.  Paroxysmal Atrial Fibrillation.: Continue current medication . She has  as scheduled appointment with Cardiology.   F/U with Dr Carilyn in 4- 6 weeks

## 2023-11-18 NOTE — Patient Instructions (Signed)
 Schedule an appointment with Dr Darien Eden   Speak with Cardiologist and Dr Vonne Guarneri Regarding ordering the Plavix , if you have a problem call our office or send a My- Chart   Neurology appointment is scheduled in August 2025

## 2023-11-19 DIAGNOSIS — E871 Hypo-osmolality and hyponatremia: Secondary | ICD-10-CM | POA: Diagnosis not present

## 2023-11-19 DIAGNOSIS — I5033 Acute on chronic diastolic (congestive) heart failure: Secondary | ICD-10-CM | POA: Diagnosis not present

## 2023-11-19 DIAGNOSIS — M810 Age-related osteoporosis without current pathological fracture: Secondary | ICD-10-CM | POA: Diagnosis not present

## 2023-11-19 DIAGNOSIS — K2991 Gastroduodenitis, unspecified, with bleeding: Secondary | ICD-10-CM | POA: Diagnosis not present

## 2023-11-19 DIAGNOSIS — Z556 Problems related to health literacy: Secondary | ICD-10-CM | POA: Diagnosis not present

## 2023-11-19 DIAGNOSIS — K643 Fourth degree hemorrhoids: Secondary | ICD-10-CM | POA: Diagnosis not present

## 2023-11-19 DIAGNOSIS — I2781 Cor pulmonale (chronic): Secondary | ICD-10-CM | POA: Diagnosis not present

## 2023-11-19 DIAGNOSIS — I4821 Permanent atrial fibrillation: Secondary | ICD-10-CM | POA: Diagnosis not present

## 2023-11-19 DIAGNOSIS — E039 Hypothyroidism, unspecified: Secondary | ICD-10-CM | POA: Diagnosis not present

## 2023-11-19 DIAGNOSIS — I272 Pulmonary hypertension, unspecified: Secondary | ICD-10-CM | POA: Diagnosis not present

## 2023-11-19 DIAGNOSIS — R339 Retention of urine, unspecified: Secondary | ICD-10-CM | POA: Diagnosis not present

## 2023-11-19 DIAGNOSIS — I50810 Right heart failure, unspecified: Secondary | ICD-10-CM | POA: Diagnosis not present

## 2023-11-19 DIAGNOSIS — D509 Iron deficiency anemia, unspecified: Secondary | ICD-10-CM | POA: Diagnosis not present

## 2023-11-19 DIAGNOSIS — J9601 Acute respiratory failure with hypoxia: Secondary | ICD-10-CM | POA: Diagnosis not present

## 2023-11-19 DIAGNOSIS — N179 Acute kidney failure, unspecified: Secondary | ICD-10-CM | POA: Diagnosis not present

## 2023-11-19 DIAGNOSIS — D62 Acute posthemorrhagic anemia: Secondary | ICD-10-CM | POA: Diagnosis not present

## 2023-11-19 DIAGNOSIS — I110 Hypertensive heart disease with heart failure: Secondary | ICD-10-CM | POA: Diagnosis not present

## 2023-11-19 DIAGNOSIS — K2971 Gastritis, unspecified, with bleeding: Secondary | ICD-10-CM | POA: Diagnosis not present

## 2023-11-19 DIAGNOSIS — J470 Bronchiectasis with acute lower respiratory infection: Secondary | ICD-10-CM | POA: Diagnosis not present

## 2023-11-19 DIAGNOSIS — C44722 Squamous cell carcinoma of skin of right lower limb, including hip: Secondary | ICD-10-CM | POA: Diagnosis not present

## 2023-11-24 ENCOUNTER — Encounter: Payer: Self-pay | Admitting: Cardiology

## 2023-11-24 ENCOUNTER — Ambulatory Visit: Payer: Medicare Other | Attending: Cardiology | Admitting: Cardiology

## 2023-11-24 VITALS — BP 118/60 | HR 60 | Ht 62.0 in | Wt 98.2 lb

## 2023-11-24 DIAGNOSIS — E039 Hypothyroidism, unspecified: Secondary | ICD-10-CM | POA: Diagnosis not present

## 2023-11-24 DIAGNOSIS — M810 Age-related osteoporosis without current pathological fracture: Secondary | ICD-10-CM | POA: Diagnosis not present

## 2023-11-24 DIAGNOSIS — C44722 Squamous cell carcinoma of skin of right lower limb, including hip: Secondary | ICD-10-CM | POA: Diagnosis not present

## 2023-11-24 DIAGNOSIS — I272 Pulmonary hypertension, unspecified: Secondary | ICD-10-CM | POA: Diagnosis not present

## 2023-11-24 DIAGNOSIS — I50810 Right heart failure, unspecified: Secondary | ICD-10-CM | POA: Diagnosis not present

## 2023-11-24 DIAGNOSIS — K5900 Constipation, unspecified: Secondary | ICD-10-CM | POA: Diagnosis not present

## 2023-11-24 DIAGNOSIS — I5033 Acute on chronic diastolic (congestive) heart failure: Secondary | ICD-10-CM | POA: Diagnosis not present

## 2023-11-24 DIAGNOSIS — Z9181 History of falling: Secondary | ICD-10-CM | POA: Diagnosis not present

## 2023-11-24 DIAGNOSIS — I5032 Chronic diastolic (congestive) heart failure: Secondary | ICD-10-CM

## 2023-11-24 DIAGNOSIS — E871 Hypo-osmolality and hyponatremia: Secondary | ICD-10-CM | POA: Diagnosis not present

## 2023-11-24 DIAGNOSIS — K922 Gastrointestinal hemorrhage, unspecified: Secondary | ICD-10-CM | POA: Diagnosis not present

## 2023-11-24 DIAGNOSIS — G4700 Insomnia, unspecified: Secondary | ICD-10-CM | POA: Diagnosis not present

## 2023-11-24 DIAGNOSIS — I4821 Permanent atrial fibrillation: Secondary | ICD-10-CM | POA: Diagnosis not present

## 2023-11-24 DIAGNOSIS — Z8673 Personal history of transient ischemic attack (TIA), and cerebral infarction without residual deficits: Secondary | ICD-10-CM | POA: Diagnosis not present

## 2023-11-24 DIAGNOSIS — Z556 Problems related to health literacy: Secondary | ICD-10-CM | POA: Diagnosis not present

## 2023-11-24 DIAGNOSIS — E119 Type 2 diabetes mellitus without complications: Secondary | ICD-10-CM | POA: Diagnosis not present

## 2023-11-24 DIAGNOSIS — I2781 Cor pulmonale (chronic): Secondary | ICD-10-CM | POA: Diagnosis not present

## 2023-11-24 DIAGNOSIS — I110 Hypertensive heart disease with heart failure: Secondary | ICD-10-CM | POA: Diagnosis not present

## 2023-11-24 DIAGNOSIS — D509 Iron deficiency anemia, unspecified: Secondary | ICD-10-CM | POA: Diagnosis not present

## 2023-11-24 MED ORDER — FARXIGA 10 MG PO TABS: 10.0000 mg | ORAL_TABLET | Freq: Every day | ORAL | 6 refills | Status: DC

## 2023-11-24 MED ORDER — FUROSEMIDE 20 MG PO TABS: 20.0000 mg | ORAL_TABLET | Freq: Every day | ORAL | 3 refills | Status: DC

## 2023-11-24 MED ORDER — PANTOPRAZOLE SODIUM 40 MG PO TBEC: 40.0000 mg | DELAYED_RELEASE_TABLET | Freq: Every day | ORAL | 6 refills | Status: DC

## 2023-11-24 NOTE — Progress Notes (Signed)
 Cardiology Office Note  Date: 11/24/2023   ID: Shelly Fields, DOB Mar 19, 1935, MRN [id number]  History of Present Illness: Shelly Fields is an 89 y.o. female last seen in March.  She is here today with her son for a follow-up visit.  I reviewed interval records.  We discussed the adjustments in her medications including discontinuation of anticoagulation in light of severe GI bleeding.  We went over her medications in detail and discussed refills.  She has had some fluid retention on Lasix  20 mg daily and we discussed increasing this back to 40 mg alternating with 20 mg every other day.  She is back on Farxiga  10 mg daily and also continues on Toprol -XL 75 mg twice daily for heart rate control.  Tolerating aspirin  and Plavix  without obvious bleeding problems.  She remains on iron  supplements and has follow-up with her PCP.  Her hemoglobin was 8.9 in late May.  I reviewed her echocardiogram from April and May, noted below.  Physical Exam: VS:  BP 118/60   Pulse 60   Ht 5' 2 (1.575 m)   Wt 98 lb 3.2 oz (44.5 kg)   SpO2 98%   BMI 17.96 kg/m , BMI Body mass index is 17.96 kg/m.  Wt Readings from Last 3 Encounters:  11/24/23 98 lb 3.2 oz (44.5 kg)  11/18/23 98 lb (44.5 kg)  11/04/23 88 lb 6.5 oz (40.1 kg)    General: Patient appears comfortable at rest. HEENT: Conjunctiva and lids normal. Neck: Supple, no elevated JVP or carotid bruits. Lungs: Clear to auscultation, nonlabored breathing at rest. Cardiac: Irregularly irregular, 2/6 systolic murmur without gallop. Extremities: Venous stasis and mild edema.  ECG:  An ECG dated 10/21/2023 was personally reviewed today and demonstrated:  Atrial fibrillation with rightward axis.  Labwork: 09/16/2023: B Natriuretic Peptide 636.0 09/19/2023: TSH 11.008 10/23/2023: Magnesium 2.0 10/27/2023: ALT 12; AST 17 11/02/2023: Platelets 339 11/03/2023: BUN 13; Creatinine, Ser 0.82; Potassium 4.1; Sodium 127 11/04/2023: Hemoglobin 8.9     Component  Value Date/Time   CHOL 112 10/22/2023 0443   TRIG 43 10/22/2023 0443   HDL 44 10/22/2023 0443   CHOLHDL 2.5 10/22/2023 0443   VLDL 9 10/22/2023 0443   LDLCALC 59 10/22/2023 0443   Other Studies Reviewed Today:  Echocardiogram 09/17/2023:  1. Left ventricular ejection fraction, by estimation, is 70 to 75%. The  left ventricle has hyperdynamic function. The left ventricle has no  regional wall motion abnormalities. There is mild concentric left  ventricular hypertrophy. Left ventricular  diastolic parameters are indeterminate. There is the interventricular  septum is flattened in systole and diastole, consistent with right  ventricular pressure and volume overload.   2. Right ventricular systolic function is mildly reduced. The right  ventricular size is severely enlarged. There is normal pulmonary artery  systolic pressure. The estimated right ventricular systolic pressure is  30.7 mmHg.   3. Left atrial size was moderately dilated.   4. Right atrial size was severely dilated.   5. Left pleural effusion present. There is a small to moderate  pericardial effusion. The pericardial effusion is posterior to the left  ventricle.   6. The mitral valve is degenerative. Mild to moderate mitral valve  regurgitation.   7. The tricuspid valve is degenerative. Tricuspid valve regurgitation is  moderate to severe.   8. The aortic valve is tricuspid. Aortic valve regurgitation is mild. No  aortic stenosis is present.   9. The inferior vena cava is dilated in size  with <50% respiratory  variability, suggesting right atrial pressure of 15 mmHg.   Limited echocardiogram 10/22/2023:  1. There is mild left ventricular hypertrophy. Left ventricular diastolic  parameters are indeterminate. There is the interventricular septum is  flattened in systole and diastole, consistent with right ventricular  pressure and volume overload.   2. Right ventricular systolic function is moderately reduced. The  right  ventricular size is severely enlarged. There is severely elevated  pulmonary artery systolic pressure. The estimated right ventricular  systolic pressure is 42.2 mmHg.   3. Left atrial size was severely dilated.   4. Right atrial size was severely dilated.   5. The mitral valve is abnormal. Mild mitral valve regurgitation. No  evidence of mitral stenosis.   6. The tricuspid valve is abnormal. Tricuspid valve regurgitation is  severe.   7. The aortic valve is tricuspid. Aortic valve regurgitation is mild. No  aortic stenosis is present.   8. The inferior vena cava is dilated in size with <50% respiratory  variability, suggesting right atrial pressure of 15 mmHg.   9. Limited echo in setting of stroke   Assessment and Plan:  1.  Permanent atrial fibrillation with CHA2DS2-VASc score of 4.  No longer on anticoagulation given severe GI bleed (recent hospital evaluation reviewed).  Plan to continue Toprol  XL 75 mg twice daily for heart rate control.   2.  HFpEF with LVEF 70 to 75% by echocardiogram in April, mild RV dysfunction also noted.  Change Lasix  to 40 mg alternating with 20 mg every other day.  Continue Farxiga  10 mg daily.  She is also on KCl 20 mEq daily.   3.  Primary hypertension.   4.  History of stroke.  Now on aspirin  81 mg daily and Plavix  75 mg daily.  Disposition:  Follow up 3 months.  Signed, Gerard Knight, M.D., F.A.C.C. [facility]

## 2023-11-24 NOTE — Patient Instructions (Addendum)
 Medication Instructions:  Your physician has recommended you make the following change in your medication:  Take furosemide  40 mg alternating with 20 mg daily. Change protonix  to 40 mg daily Continue all other medications as prescribed  Labwork: none  Testing/Procedures: none  Follow-Up: Your physician recommends that you schedule a follow-up appointment in: 3 months  Any Other Special Instructions Will Be Listed Below (If Applicable).  If you need a refill on your cardiac medications before your next appointment, please call your pharmacy.

## 2023-11-26 DIAGNOSIS — Z09 Encounter for follow-up examination after completed treatment for conditions other than malignant neoplasm: Secondary | ICD-10-CM | POA: Diagnosis not present

## 2023-11-26 DIAGNOSIS — J449 Chronic obstructive pulmonary disease, unspecified: Secondary | ICD-10-CM | POA: Diagnosis not present

## 2023-11-26 DIAGNOSIS — Z299 Encounter for prophylactic measures, unspecified: Secondary | ICD-10-CM | POA: Diagnosis not present

## 2023-11-26 DIAGNOSIS — I5032 Chronic diastolic (congestive) heart failure: Secondary | ICD-10-CM | POA: Diagnosis not present

## 2023-11-26 DIAGNOSIS — I10 Essential (primary) hypertension: Secondary | ICD-10-CM | POA: Diagnosis not present

## 2023-11-26 DIAGNOSIS — I4891 Unspecified atrial fibrillation: Secondary | ICD-10-CM | POA: Diagnosis not present

## 2023-11-26 DIAGNOSIS — D649 Anemia, unspecified: Secondary | ICD-10-CM | POA: Diagnosis not present

## 2023-11-29 ENCOUNTER — Other Ambulatory Visit (HOSPITAL_COMMUNITY): Payer: Self-pay | Admitting: Nurse Practitioner

## 2023-11-29 DIAGNOSIS — Z1231 Encounter for screening mammogram for malignant neoplasm of breast: Secondary | ICD-10-CM

## 2023-12-01 ENCOUNTER — Other Ambulatory Visit: Payer: Self-pay | Admitting: Cardiology

## 2023-12-01 DIAGNOSIS — I5033 Acute on chronic diastolic (congestive) heart failure: Secondary | ICD-10-CM | POA: Diagnosis not present

## 2023-12-01 DIAGNOSIS — K5900 Constipation, unspecified: Secondary | ICD-10-CM | POA: Diagnosis not present

## 2023-12-01 DIAGNOSIS — K922 Gastrointestinal hemorrhage, unspecified: Secondary | ICD-10-CM | POA: Diagnosis not present

## 2023-12-01 DIAGNOSIS — Z9181 History of falling: Secondary | ICD-10-CM | POA: Diagnosis not present

## 2023-12-01 DIAGNOSIS — E871 Hypo-osmolality and hyponatremia: Secondary | ICD-10-CM | POA: Diagnosis not present

## 2023-12-01 DIAGNOSIS — G4700 Insomnia, unspecified: Secondary | ICD-10-CM | POA: Diagnosis not present

## 2023-12-01 DIAGNOSIS — E119 Type 2 diabetes mellitus without complications: Secondary | ICD-10-CM | POA: Diagnosis not present

## 2023-12-01 DIAGNOSIS — D509 Iron deficiency anemia, unspecified: Secondary | ICD-10-CM | POA: Diagnosis not present

## 2023-12-01 DIAGNOSIS — Z556 Problems related to health literacy: Secondary | ICD-10-CM | POA: Diagnosis not present

## 2023-12-01 DIAGNOSIS — E039 Hypothyroidism, unspecified: Secondary | ICD-10-CM | POA: Diagnosis not present

## 2023-12-01 DIAGNOSIS — I110 Hypertensive heart disease with heart failure: Secondary | ICD-10-CM | POA: Diagnosis not present

## 2023-12-01 DIAGNOSIS — I4821 Permanent atrial fibrillation: Secondary | ICD-10-CM | POA: Diagnosis not present

## 2023-12-01 DIAGNOSIS — I2781 Cor pulmonale (chronic): Secondary | ICD-10-CM | POA: Diagnosis not present

## 2023-12-01 DIAGNOSIS — I50810 Right heart failure, unspecified: Secondary | ICD-10-CM | POA: Diagnosis not present

## 2023-12-01 DIAGNOSIS — M810 Age-related osteoporosis without current pathological fracture: Secondary | ICD-10-CM | POA: Diagnosis not present

## 2023-12-01 DIAGNOSIS — I272 Pulmonary hypertension, unspecified: Secondary | ICD-10-CM | POA: Diagnosis not present

## 2023-12-01 DIAGNOSIS — Z8673 Personal history of transient ischemic attack (TIA), and cerebral infarction without residual deficits: Secondary | ICD-10-CM | POA: Diagnosis not present

## 2023-12-01 DIAGNOSIS — C44722 Squamous cell carcinoma of skin of right lower limb, including hip: Secondary | ICD-10-CM | POA: Diagnosis not present

## 2023-12-02 DIAGNOSIS — D509 Iron deficiency anemia, unspecified: Secondary | ICD-10-CM | POA: Diagnosis not present

## 2023-12-02 DIAGNOSIS — Z8673 Personal history of transient ischemic attack (TIA), and cerebral infarction without residual deficits: Secondary | ICD-10-CM | POA: Diagnosis not present

## 2023-12-02 DIAGNOSIS — I110 Hypertensive heart disease with heart failure: Secondary | ICD-10-CM | POA: Diagnosis not present

## 2023-12-02 DIAGNOSIS — C44722 Squamous cell carcinoma of skin of right lower limb, including hip: Secondary | ICD-10-CM | POA: Diagnosis not present

## 2023-12-02 DIAGNOSIS — Z556 Problems related to health literacy: Secondary | ICD-10-CM | POA: Diagnosis not present

## 2023-12-02 DIAGNOSIS — E119 Type 2 diabetes mellitus without complications: Secondary | ICD-10-CM | POA: Diagnosis not present

## 2023-12-02 DIAGNOSIS — G4700 Insomnia, unspecified: Secondary | ICD-10-CM | POA: Diagnosis not present

## 2023-12-02 DIAGNOSIS — I272 Pulmonary hypertension, unspecified: Secondary | ICD-10-CM | POA: Diagnosis not present

## 2023-12-02 DIAGNOSIS — I4821 Permanent atrial fibrillation: Secondary | ICD-10-CM | POA: Diagnosis not present

## 2023-12-02 DIAGNOSIS — K922 Gastrointestinal hemorrhage, unspecified: Secondary | ICD-10-CM | POA: Diagnosis not present

## 2023-12-02 DIAGNOSIS — I5033 Acute on chronic diastolic (congestive) heart failure: Secondary | ICD-10-CM | POA: Diagnosis not present

## 2023-12-02 DIAGNOSIS — K5900 Constipation, unspecified: Secondary | ICD-10-CM | POA: Diagnosis not present

## 2023-12-02 DIAGNOSIS — I50810 Right heart failure, unspecified: Secondary | ICD-10-CM | POA: Diagnosis not present

## 2023-12-02 DIAGNOSIS — Z9181 History of falling: Secondary | ICD-10-CM | POA: Diagnosis not present

## 2023-12-02 DIAGNOSIS — E039 Hypothyroidism, unspecified: Secondary | ICD-10-CM | POA: Diagnosis not present

## 2023-12-02 DIAGNOSIS — E871 Hypo-osmolality and hyponatremia: Secondary | ICD-10-CM | POA: Diagnosis not present

## 2023-12-02 DIAGNOSIS — M810 Age-related osteoporosis without current pathological fracture: Secondary | ICD-10-CM | POA: Diagnosis not present

## 2023-12-02 DIAGNOSIS — I2781 Cor pulmonale (chronic): Secondary | ICD-10-CM | POA: Diagnosis not present

## 2023-12-08 DIAGNOSIS — E039 Hypothyroidism, unspecified: Secondary | ICD-10-CM | POA: Diagnosis not present

## 2023-12-08 DIAGNOSIS — I5033 Acute on chronic diastolic (congestive) heart failure: Secondary | ICD-10-CM | POA: Diagnosis not present

## 2023-12-08 DIAGNOSIS — G4700 Insomnia, unspecified: Secondary | ICD-10-CM | POA: Diagnosis not present

## 2023-12-08 DIAGNOSIS — I50810 Right heart failure, unspecified: Secondary | ICD-10-CM | POA: Diagnosis not present

## 2023-12-08 DIAGNOSIS — K922 Gastrointestinal hemorrhage, unspecified: Secondary | ICD-10-CM | POA: Diagnosis not present

## 2023-12-08 DIAGNOSIS — I2781 Cor pulmonale (chronic): Secondary | ICD-10-CM | POA: Diagnosis not present

## 2023-12-08 DIAGNOSIS — E119 Type 2 diabetes mellitus without complications: Secondary | ICD-10-CM | POA: Diagnosis not present

## 2023-12-08 DIAGNOSIS — D509 Iron deficiency anemia, unspecified: Secondary | ICD-10-CM | POA: Diagnosis not present

## 2023-12-08 DIAGNOSIS — I4821 Permanent atrial fibrillation: Secondary | ICD-10-CM | POA: Diagnosis not present

## 2023-12-08 DIAGNOSIS — I272 Pulmonary hypertension, unspecified: Secondary | ICD-10-CM | POA: Diagnosis not present

## 2023-12-08 DIAGNOSIS — Z556 Problems related to health literacy: Secondary | ICD-10-CM | POA: Diagnosis not present

## 2023-12-08 DIAGNOSIS — Z9181 History of falling: Secondary | ICD-10-CM | POA: Diagnosis not present

## 2023-12-08 DIAGNOSIS — I110 Hypertensive heart disease with heart failure: Secondary | ICD-10-CM | POA: Diagnosis not present

## 2023-12-08 DIAGNOSIS — E871 Hypo-osmolality and hyponatremia: Secondary | ICD-10-CM | POA: Diagnosis not present

## 2023-12-08 DIAGNOSIS — M810 Age-related osteoporosis without current pathological fracture: Secondary | ICD-10-CM | POA: Diagnosis not present

## 2023-12-08 DIAGNOSIS — C44722 Squamous cell carcinoma of skin of right lower limb, including hip: Secondary | ICD-10-CM | POA: Diagnosis not present

## 2023-12-08 DIAGNOSIS — K5900 Constipation, unspecified: Secondary | ICD-10-CM | POA: Diagnosis not present

## 2023-12-08 DIAGNOSIS — Z8673 Personal history of transient ischemic attack (TIA), and cerebral infarction without residual deficits: Secondary | ICD-10-CM | POA: Diagnosis not present

## 2023-12-09 DIAGNOSIS — M810 Age-related osteoporosis without current pathological fracture: Secondary | ICD-10-CM | POA: Diagnosis not present

## 2023-12-09 DIAGNOSIS — G4700 Insomnia, unspecified: Secondary | ICD-10-CM | POA: Diagnosis not present

## 2023-12-09 DIAGNOSIS — I5033 Acute on chronic diastolic (congestive) heart failure: Secondary | ICD-10-CM | POA: Diagnosis not present

## 2023-12-09 DIAGNOSIS — I110 Hypertensive heart disease with heart failure: Secondary | ICD-10-CM | POA: Diagnosis not present

## 2023-12-09 DIAGNOSIS — Z8673 Personal history of transient ischemic attack (TIA), and cerebral infarction without residual deficits: Secondary | ICD-10-CM | POA: Diagnosis not present

## 2023-12-09 DIAGNOSIS — D509 Iron deficiency anemia, unspecified: Secondary | ICD-10-CM | POA: Diagnosis not present

## 2023-12-09 DIAGNOSIS — I272 Pulmonary hypertension, unspecified: Secondary | ICD-10-CM | POA: Diagnosis not present

## 2023-12-09 DIAGNOSIS — I2781 Cor pulmonale (chronic): Secondary | ICD-10-CM | POA: Diagnosis not present

## 2023-12-09 DIAGNOSIS — C44722 Squamous cell carcinoma of skin of right lower limb, including hip: Secondary | ICD-10-CM | POA: Diagnosis not present

## 2023-12-09 DIAGNOSIS — I4821 Permanent atrial fibrillation: Secondary | ICD-10-CM | POA: Diagnosis not present

## 2023-12-09 DIAGNOSIS — E119 Type 2 diabetes mellitus without complications: Secondary | ICD-10-CM | POA: Diagnosis not present

## 2023-12-09 DIAGNOSIS — K922 Gastrointestinal hemorrhage, unspecified: Secondary | ICD-10-CM | POA: Diagnosis not present

## 2023-12-09 DIAGNOSIS — Z556 Problems related to health literacy: Secondary | ICD-10-CM | POA: Diagnosis not present

## 2023-12-09 DIAGNOSIS — Z9181 History of falling: Secondary | ICD-10-CM | POA: Diagnosis not present

## 2023-12-09 DIAGNOSIS — I50810 Right heart failure, unspecified: Secondary | ICD-10-CM | POA: Diagnosis not present

## 2023-12-09 DIAGNOSIS — E039 Hypothyroidism, unspecified: Secondary | ICD-10-CM | POA: Diagnosis not present

## 2023-12-09 DIAGNOSIS — E871 Hypo-osmolality and hyponatremia: Secondary | ICD-10-CM | POA: Diagnosis not present

## 2023-12-09 DIAGNOSIS — K5900 Constipation, unspecified: Secondary | ICD-10-CM | POA: Diagnosis not present

## 2023-12-13 DIAGNOSIS — I4821 Permanent atrial fibrillation: Secondary | ICD-10-CM | POA: Diagnosis not present

## 2023-12-13 DIAGNOSIS — I2781 Cor pulmonale (chronic): Secondary | ICD-10-CM | POA: Diagnosis not present

## 2023-12-13 DIAGNOSIS — I272 Pulmonary hypertension, unspecified: Secondary | ICD-10-CM | POA: Diagnosis not present

## 2023-12-13 DIAGNOSIS — C44722 Squamous cell carcinoma of skin of right lower limb, including hip: Secondary | ICD-10-CM | POA: Diagnosis not present

## 2023-12-13 DIAGNOSIS — D509 Iron deficiency anemia, unspecified: Secondary | ICD-10-CM | POA: Diagnosis not present

## 2023-12-13 DIAGNOSIS — G4700 Insomnia, unspecified: Secondary | ICD-10-CM | POA: Diagnosis not present

## 2023-12-13 DIAGNOSIS — I50810 Right heart failure, unspecified: Secondary | ICD-10-CM | POA: Diagnosis not present

## 2023-12-13 DIAGNOSIS — K5900 Constipation, unspecified: Secondary | ICD-10-CM | POA: Diagnosis not present

## 2023-12-13 DIAGNOSIS — Z556 Problems related to health literacy: Secondary | ICD-10-CM | POA: Diagnosis not present

## 2023-12-13 DIAGNOSIS — E039 Hypothyroidism, unspecified: Secondary | ICD-10-CM | POA: Diagnosis not present

## 2023-12-13 DIAGNOSIS — E119 Type 2 diabetes mellitus without complications: Secondary | ICD-10-CM | POA: Diagnosis not present

## 2023-12-13 DIAGNOSIS — I110 Hypertensive heart disease with heart failure: Secondary | ICD-10-CM | POA: Diagnosis not present

## 2023-12-13 DIAGNOSIS — Z8673 Personal history of transient ischemic attack (TIA), and cerebral infarction without residual deficits: Secondary | ICD-10-CM | POA: Diagnosis not present

## 2023-12-13 DIAGNOSIS — I5033 Acute on chronic diastolic (congestive) heart failure: Secondary | ICD-10-CM | POA: Diagnosis not present

## 2023-12-13 DIAGNOSIS — K922 Gastrointestinal hemorrhage, unspecified: Secondary | ICD-10-CM | POA: Diagnosis not present

## 2023-12-13 DIAGNOSIS — E871 Hypo-osmolality and hyponatremia: Secondary | ICD-10-CM | POA: Diagnosis not present

## 2023-12-13 DIAGNOSIS — Z9181 History of falling: Secondary | ICD-10-CM | POA: Diagnosis not present

## 2023-12-13 DIAGNOSIS — M810 Age-related osteoporosis without current pathological fracture: Secondary | ICD-10-CM | POA: Diagnosis not present

## 2023-12-15 DIAGNOSIS — I4821 Permanent atrial fibrillation: Secondary | ICD-10-CM | POA: Diagnosis not present

## 2023-12-15 DIAGNOSIS — C44722 Squamous cell carcinoma of skin of right lower limb, including hip: Secondary | ICD-10-CM | POA: Diagnosis not present

## 2023-12-15 DIAGNOSIS — G4700 Insomnia, unspecified: Secondary | ICD-10-CM | POA: Diagnosis not present

## 2023-12-15 DIAGNOSIS — Z9181 History of falling: Secondary | ICD-10-CM | POA: Diagnosis not present

## 2023-12-15 DIAGNOSIS — I272 Pulmonary hypertension, unspecified: Secondary | ICD-10-CM | POA: Diagnosis not present

## 2023-12-15 DIAGNOSIS — I2781 Cor pulmonale (chronic): Secondary | ICD-10-CM | POA: Diagnosis not present

## 2023-12-15 DIAGNOSIS — M810 Age-related osteoporosis without current pathological fracture: Secondary | ICD-10-CM | POA: Diagnosis not present

## 2023-12-15 DIAGNOSIS — Z8673 Personal history of transient ischemic attack (TIA), and cerebral infarction without residual deficits: Secondary | ICD-10-CM | POA: Diagnosis not present

## 2023-12-15 DIAGNOSIS — E119 Type 2 diabetes mellitus without complications: Secondary | ICD-10-CM | POA: Diagnosis not present

## 2023-12-15 DIAGNOSIS — E039 Hypothyroidism, unspecified: Secondary | ICD-10-CM | POA: Diagnosis not present

## 2023-12-15 DIAGNOSIS — E871 Hypo-osmolality and hyponatremia: Secondary | ICD-10-CM | POA: Diagnosis not present

## 2023-12-15 DIAGNOSIS — D509 Iron deficiency anemia, unspecified: Secondary | ICD-10-CM | POA: Diagnosis not present

## 2023-12-15 DIAGNOSIS — K5900 Constipation, unspecified: Secondary | ICD-10-CM | POA: Diagnosis not present

## 2023-12-15 DIAGNOSIS — K922 Gastrointestinal hemorrhage, unspecified: Secondary | ICD-10-CM | POA: Diagnosis not present

## 2023-12-15 DIAGNOSIS — I5033 Acute on chronic diastolic (congestive) heart failure: Secondary | ICD-10-CM | POA: Diagnosis not present

## 2023-12-15 DIAGNOSIS — I50810 Right heart failure, unspecified: Secondary | ICD-10-CM | POA: Diagnosis not present

## 2023-12-15 DIAGNOSIS — I110 Hypertensive heart disease with heart failure: Secondary | ICD-10-CM | POA: Diagnosis not present

## 2023-12-15 DIAGNOSIS — Z556 Problems related to health literacy: Secondary | ICD-10-CM | POA: Diagnosis not present

## 2023-12-16 ENCOUNTER — Encounter: Payer: Self-pay | Admitting: Physical Medicine & Rehabilitation

## 2023-12-16 ENCOUNTER — Encounter: Attending: Physical Medicine & Rehabilitation | Admitting: Physical Medicine & Rehabilitation

## 2023-12-16 VITALS — BP 170/84 | HR 80 | Ht 62.0 in | Wt 95.0 lb

## 2023-12-16 DIAGNOSIS — I480 Paroxysmal atrial fibrillation: Secondary | ICD-10-CM | POA: Insufficient documentation

## 2023-12-16 DIAGNOSIS — I63511 Cerebral infarction due to unspecified occlusion or stenosis of right middle cerebral artery: Secondary | ICD-10-CM | POA: Insufficient documentation

## 2023-12-16 NOTE — Progress Notes (Signed)
 Subjective:    Patient ID: Shelly Fields, female    DOB: May 18, 1935, 89 y.o.   MRN: [id number] 89 y.o. right-handed female with history significant for atrial fibrillation off her Xarelto  due to recent concern for blood loss anemia followed by cardiology services Dr. Jayson Sierras on recent admission 09/22/2023 for GI bleed followed by Dr. Cinderella requiring transfusion of 3 units, hypothyroidism, type 2 diabetes mellitus, hypertension anxiety as well as severe hearing loss.  Per chart review lives alone.  Reportedly independent driving prior to admission with occasional use of a rolling walker.  Presented to [facility] 10/21/2023 after falling backwards into her nightstand.  When family found her noted to have left-sided weakness and dragging her left leg.  MRI showed acute right MCA infarction involving the right insula and overlying frontal parietal lobes.  No substantial mass effect.  CT angiogram head and neck showed occlusion of distal M2 branch of the right MCA.  No hemodynamically significant stenosis of the carotid arteries.  Admission chemistries unremarkable except hemoglobin 11.6 sodium 132 hemoglobin A1c 5.2 total CK within normal limits.  Echocardiogram with ejection fraction of 70 to 75% and mild left ventricular hypertrophy.  Neurology follow-up maintained on low-dose aspirin  and Plavix  for CVA prophylaxis x 90 days then aspirin  alone. She has had improvement in functional use RUE/LUE  and RLE/LLE as well as improvement in awareness.  Patient requires contact-guard assist for mobility.  Ambulates 220 feet with rollator.  Contact-guard assist for stairs.  Set up for upper body dressing contact-guard for lower body dressing.  Full family teaching completed plan discharge to home Admit date: 10/26/2023 Discharge date: 11/02/2023 history of car ride better now HPI  Lives in her own home but family has a home on the same property Mod I dressing and bathing with shower seat Ambulates  with a cane outside the home and for longer distances within the home.  She does not use the cane when she is making snacks in the kitchen Home health PT is still coming out to the house as well as home health nurse Primary care physician is Dr. Rosamond Cardiologist is Dr. Sierras  BP is normal at home but has been elevated in this office twice  No falls  [place]  She was driving before her stroke.  She reports no new visual issues since her stroke. She is here with her daughter-in-law who drove her to day. Pain Inventory Average Pain 0 Pain Right Now 0 My pain is no pain  LOCATION OF PAIN  No pain  BOWEL Number of stools per week: 7 Oral laxative use Yes   BLADDER Normal and Pads  Bladder incontinence with Lasix     Mobility walk with assistance use a cane how many minutes can you walk? unknown ability to climb steps?  yes do you drive?  yes transfers alone Do you have any goals in this area?  yes  Function retired I need assistance with the following:  shopping Do you have any goals in this area?  yes  Neuro/Psych trouble walking  Prior Studies Any changes since last visit?  no  Physicians involved in your care Any changes since last visit?  no   Family History  Problem Relation Age of Onset   Heart disease Mother    Hypertension Father    Colon cancer Neg Hx    Social History   Socioeconomic History   Marital  status: Widowed    Spouse name: Not on file   Number of children: Not on file   Years of education: Not on file   Highest education level: Not on file  Occupational History   Not on file  Tobacco Use   Smoking status: Never    Passive exposure: Past   Smokeless tobacco: Never  Vaping Use   Vaping status: Never Used  Substance and Sexual Activity   Alcohol use: No    Alcohol/week: 0.0 standard drinks of alcohol   Drug use: No   Sexual activity: Not on file  Other Topics Concern    Not on file  Social History Narrative   Not on file   Social Drivers of Health   Financial Resource Strain: Not on file  Food Insecurity: No Food Insecurity (10/21/2023)   Hunger Vital Sign    Worried About Running Out of Food in the Last Year: Never true    Ran Out of Food in the Last Year: Never true  Transportation Needs: No Transportation Needs (10/21/2023)   PRAPARE - Administrator, Civil Service (Medical): No    Lack of Transportation (Non-Medical): No  Physical Activity: Not on file  Stress: Not on file  Social Connections: Moderately Integrated (10/21/2023)   Social Connection and Isolation Panel    Frequency of Communication with Friends and Family: More than three times a week    Frequency of Social Gatherings with Friends and Family: More than three times a week    Attends Religious Services: 1 to 4 times per year    Active Member of [employer] or Organizations: Patient declined    Attends Banker Meetings: 1 to 4 times per year    Marital Status: Widowed   Past Surgical History:  Procedure Laterality Date   ABDOMINAL HYSTERECTOMY     BIOPSY  03/25/2023   Procedure: BIOPSY;  Surgeon: Cinderella Deatrice FALCON, MD;  Location: [facility];  Service: Endoscopy;;   CATARACT EXTRACTION     COLONOSCOPY N/A 01/12/2013   Procedure: COLONOSCOPY;  Surgeon: Claudis RAYMOND Rivet, MD;  Location: [facility];  Service: Endoscopy;  Laterality: N/A;  1030   COLONOSCOPY N/A 11/18/2017   Procedure: COLONOSCOPY;  Surgeon: Rivet Claudis RAYMOND, MD;  Location: [facility];  Service: Endoscopy;  Laterality: N/A;   COLONOSCOPY WITH PROPOFOL  N/A 03/25/2023   Procedure: COLONOSCOPY WITH PROPOFOL ;  Surgeon: Cinderella Deatrice FALCON, MD;  [facility]: [facility];  Service: Endoscopy;  Laterality: N/A;  11:00AM;ASA 3   ENTEROSCOPY N/A 10/31/2023   Procedure: ENTEROSCOPY;  Surgeon: Stacia Glendia BRAVO, MD;  Location: [facility];  Service: Gastroenterology;  Laterality: N/A;    ESOPHAGOGASTRODUODENOSCOPY (EGD) WITH PROPOFOL  N/A 03/25/2023   Procedure: ESOPHAGOGASTRODUODENOSCOPY (EGD) WITH PROPOFOL ;  Surgeon: Cinderella Deatrice FALCON, MD;  [facility]: [facility];  Service: Endoscopy;  Laterality: N/A;  11:00;ASA 3   GIVENS CAPSULE STUDY N/A 10/29/2023   Procedure: IMAGING PROCEDURE, GI TRACT, INTRALUMINAL, VIA CAPSULE;  Surgeon: Stacia Glendia BRAVO, MD;  Location: [facility] ENDOSCOPY;  Service: Gastroenterology;  Laterality: N/A;   HOT HEMOSTASIS N/A 10/31/2023   Procedure: ARGON PLASMA COAGULATION;  Surgeon: Stacia Glendia BRAVO, MD;  Location: [facility] ENDOSCOPY;  Service: Gastroenterology;  Laterality: N/A;   LESION REMOVAL Right 09/13/2023   Procedure: excision of right lower extremity squamous cell carcinoma;  Surgeon: Evonnie Dorothyann LABOR, DO;  Location: [facility];  Service: General;  Laterality: Right;   POLYPECTOMY  11/18/2017   Procedure: POLYPECTOMY;  Surgeon: Rivet,  Claudis PENNER, MD;  Location: [facility];  Service: Endoscopy;;  colon   Past Medical History:  Diagnosis Date   Cor pulmonale (chronic) (HCC)    Diverticulosis    Essential hypertension    Hypothyroidism    Internal hemorrhoids    Persistent atrial fibrillation (HCC)    Pulmonary hypertension (HCC)    Seasonal allergies    Shingles    Ht 5' 2 (1.575 m)   Wt 95 lb (43.1 kg)   BMI 17.38 kg/m   Opioid Risk Score:   Fall Risk Score:  `1  Depression screen [facility] 2/9     12/16/2023   12:07 PM 11/18/2023    1:40 PM  Depression screen PHQ 2/9  Decreased Interest 0 0  Down, Depressed, Hopeless 0 0  PHQ - 2 Score 0 0  Altered sleeping  1  Tired, decreased energy  1  Change in appetite  0  Feeling bad or failure about yourself   0  Trouble concentrating  0  Moving slowly or fidgety/restless  0  Suicidal thoughts  0  PHQ-9 Score  2  Difficult doing work/chores  Not difficult at all    Review of Systems  Musculoskeletal:  Positive for gait problem.  All other systems reviewed and are negative.       Objective:   Physical Exam General No acute distress Mood affect appropriate Speech without dysarthria or aphasia She is hard of hearing and does better when able to read lips. Motor strength is 5/5 in the right deltoid bicep tricep grip hip flexor knee extensor ankle dorsiflexion 5 -/5 in left deltoid bicep tricep grip hip flexor knee extensor ankle dorsiflexor Negative straight leg raising bilaterally She has mild intention tremor bilaterally finger-nose-finger Sensation equal to light touch and pinprick bilaterally Visual fields are intact confrontation testing cervical spine range of motion is normal with rotation 75% range with flexion extension. Extraocular muscles are intact Upper extremity range of motion is normal Ambulates without assistive device no evidence of toe drag or knee instability Romberg is negative        Assessment & Plan:   1.  History of right MCA distribution infarct.  This occurred while off Coumadin due to gastrointestinal bleeding.  She is now on aspirin  and Plavix .  She continues on pantoprazole .  She has followed up with cardiology as well as primary care physician. She has had an excellent recovery and has only minimal left upper extremity fine motor deficits.  I do think she can return to driving on a limited basis in her locale. I do not think she needs any outpatient PT once she finishes with her home health. She will follow-up with cardiology as well as primary care.  She has a neurology follow-up on August 27. I will see her back on a as needed basis I discussed my recommendations with the patient as well as the daughter-in-law who is with her in the office.

## 2023-12-20 ENCOUNTER — Telehealth: Payer: Self-pay | Admitting: Cardiology

## 2023-12-20 MED ORDER — PANTOPRAZOLE SODIUM 40 MG PO TBEC: 40.0000 mg | DELAYED_RELEASE_TABLET | Freq: Every day | ORAL | 3 refills | Status: AC

## 2023-12-20 MED ORDER — FUROSEMIDE 20 MG PO TABS: 20.0000 mg | ORAL_TABLET | Freq: Every day | ORAL | 3 refills | Status: DC

## 2023-12-20 MED ORDER — CLOPIDOGREL BISULFATE 75 MG PO TABS: 75.0000 mg | ORAL_TABLET | Freq: Every day | ORAL | 3 refills | Status: DC

## 2023-12-20 NOTE — Telephone Encounter (Signed)
*  STAT* If patient is at the pharmacy, call can be transferred to refill team.   1. Which medications need to be refilled? (please list name of each medication and dose if known)    clopidogrel  (PLAVIX ) 75 MG tablet   pantoprazole  (PROTONIX ) 40 MG tablet   furosemide  (LASIX ) 20 MG tablet     4. Which pharmacy/location (including street and city if local pharmacy) is medication to be sent to?   Please send to [facility] - pt is out of these and [facility] will not fill for her till 7/18   5. Do they need a 30 day or 90 day supply?   90 day

## 2023-12-20 NOTE — Telephone Encounter (Signed)
 RX sent to requested Pharmacy

## 2023-12-22 DIAGNOSIS — I50810 Right heart failure, unspecified: Secondary | ICD-10-CM | POA: Diagnosis not present

## 2023-12-22 DIAGNOSIS — I2781 Cor pulmonale (chronic): Secondary | ICD-10-CM | POA: Diagnosis not present

## 2023-12-22 DIAGNOSIS — C44722 Squamous cell carcinoma of skin of right lower limb, including hip: Secondary | ICD-10-CM | POA: Diagnosis not present

## 2023-12-22 DIAGNOSIS — I5033 Acute on chronic diastolic (congestive) heart failure: Secondary | ICD-10-CM | POA: Diagnosis not present

## 2023-12-22 DIAGNOSIS — I272 Pulmonary hypertension, unspecified: Secondary | ICD-10-CM | POA: Diagnosis not present

## 2023-12-22 DIAGNOSIS — I110 Hypertensive heart disease with heart failure: Secondary | ICD-10-CM | POA: Diagnosis not present

## 2023-12-22 DIAGNOSIS — E871 Hypo-osmolality and hyponatremia: Secondary | ICD-10-CM | POA: Diagnosis not present

## 2023-12-22 DIAGNOSIS — E119 Type 2 diabetes mellitus without complications: Secondary | ICD-10-CM | POA: Diagnosis not present

## 2023-12-22 DIAGNOSIS — G4700 Insomnia, unspecified: Secondary | ICD-10-CM | POA: Diagnosis not present

## 2023-12-22 DIAGNOSIS — E039 Hypothyroidism, unspecified: Secondary | ICD-10-CM | POA: Diagnosis not present

## 2023-12-22 DIAGNOSIS — K5900 Constipation, unspecified: Secondary | ICD-10-CM | POA: Diagnosis not present

## 2023-12-22 DIAGNOSIS — K922 Gastrointestinal hemorrhage, unspecified: Secondary | ICD-10-CM | POA: Diagnosis not present

## 2023-12-22 DIAGNOSIS — Z8673 Personal history of transient ischemic attack (TIA), and cerebral infarction without residual deficits: Secondary | ICD-10-CM | POA: Diagnosis not present

## 2023-12-22 DIAGNOSIS — D509 Iron deficiency anemia, unspecified: Secondary | ICD-10-CM | POA: Diagnosis not present

## 2023-12-22 DIAGNOSIS — I4821 Permanent atrial fibrillation: Secondary | ICD-10-CM | POA: Diagnosis not present

## 2023-12-22 DIAGNOSIS — M810 Age-related osteoporosis without current pathological fracture: Secondary | ICD-10-CM | POA: Diagnosis not present

## 2023-12-22 DIAGNOSIS — Z9181 History of falling: Secondary | ICD-10-CM | POA: Diagnosis not present

## 2023-12-22 DIAGNOSIS — Z556 Problems related to health literacy: Secondary | ICD-10-CM | POA: Diagnosis not present

## 2023-12-27 ENCOUNTER — Ambulatory Visit (HOSPITAL_COMMUNITY)
Admission: RE | Admit: 2023-12-27 | Discharge: 2023-12-27 | Disposition: A | Discharge status: Home / Self Care | Source: Ambulatory Visit | Attending: Nurse Practitioner | Admitting: Nurse Practitioner

## 2023-12-27 DIAGNOSIS — Z1231 Encounter for screening mammogram for malignant neoplasm of breast: Secondary | ICD-10-CM | POA: Diagnosis not present

## 2023-12-29 DIAGNOSIS — G4700 Insomnia, unspecified: Secondary | ICD-10-CM | POA: Diagnosis not present

## 2023-12-29 DIAGNOSIS — C44722 Squamous cell carcinoma of skin of right lower limb, including hip: Secondary | ICD-10-CM | POA: Diagnosis not present

## 2023-12-29 DIAGNOSIS — I4821 Permanent atrial fibrillation: Secondary | ICD-10-CM | POA: Diagnosis not present

## 2023-12-29 DIAGNOSIS — K5900 Constipation, unspecified: Secondary | ICD-10-CM | POA: Diagnosis not present

## 2023-12-29 DIAGNOSIS — I50810 Right heart failure, unspecified: Secondary | ICD-10-CM | POA: Diagnosis not present

## 2023-12-29 DIAGNOSIS — K922 Gastrointestinal hemorrhage, unspecified: Secondary | ICD-10-CM | POA: Diagnosis not present

## 2023-12-29 DIAGNOSIS — I5033 Acute on chronic diastolic (congestive) heart failure: Secondary | ICD-10-CM | POA: Diagnosis not present

## 2023-12-29 DIAGNOSIS — Z9181 History of falling: Secondary | ICD-10-CM | POA: Diagnosis not present

## 2023-12-29 DIAGNOSIS — D509 Iron deficiency anemia, unspecified: Secondary | ICD-10-CM | POA: Diagnosis not present

## 2023-12-29 DIAGNOSIS — I110 Hypertensive heart disease with heart failure: Secondary | ICD-10-CM | POA: Diagnosis not present

## 2023-12-29 DIAGNOSIS — I2781 Cor pulmonale (chronic): Secondary | ICD-10-CM | POA: Diagnosis not present

## 2023-12-29 DIAGNOSIS — E871 Hypo-osmolality and hyponatremia: Secondary | ICD-10-CM | POA: Diagnosis not present

## 2023-12-29 DIAGNOSIS — Z8673 Personal history of transient ischemic attack (TIA), and cerebral infarction without residual deficits: Secondary | ICD-10-CM | POA: Diagnosis not present

## 2023-12-29 DIAGNOSIS — I272 Pulmonary hypertension, unspecified: Secondary | ICD-10-CM | POA: Diagnosis not present

## 2023-12-29 DIAGNOSIS — E039 Hypothyroidism, unspecified: Secondary | ICD-10-CM | POA: Diagnosis not present

## 2023-12-29 DIAGNOSIS — Z556 Problems related to health literacy: Secondary | ICD-10-CM | POA: Diagnosis not present

## 2023-12-29 DIAGNOSIS — E119 Type 2 diabetes mellitus without complications: Secondary | ICD-10-CM | POA: Diagnosis not present

## 2023-12-29 DIAGNOSIS — M810 Age-related osteoporosis without current pathological fracture: Secondary | ICD-10-CM | POA: Diagnosis not present

## 2024-01-05 DIAGNOSIS — C44722 Squamous cell carcinoma of skin of right lower limb, including hip: Secondary | ICD-10-CM | POA: Diagnosis not present

## 2024-01-05 DIAGNOSIS — K5900 Constipation, unspecified: Secondary | ICD-10-CM | POA: Diagnosis not present

## 2024-01-05 DIAGNOSIS — I4821 Permanent atrial fibrillation: Secondary | ICD-10-CM | POA: Diagnosis not present

## 2024-01-05 DIAGNOSIS — Z556 Problems related to health literacy: Secondary | ICD-10-CM | POA: Diagnosis not present

## 2024-01-05 DIAGNOSIS — E119 Type 2 diabetes mellitus without complications: Secondary | ICD-10-CM | POA: Diagnosis not present

## 2024-01-05 DIAGNOSIS — K922 Gastrointestinal hemorrhage, unspecified: Secondary | ICD-10-CM | POA: Diagnosis not present

## 2024-01-05 DIAGNOSIS — M810 Age-related osteoporosis without current pathological fracture: Secondary | ICD-10-CM | POA: Diagnosis not present

## 2024-01-05 DIAGNOSIS — E039 Hypothyroidism, unspecified: Secondary | ICD-10-CM | POA: Diagnosis not present

## 2024-01-05 DIAGNOSIS — Z8673 Personal history of transient ischemic attack (TIA), and cerebral infarction without residual deficits: Secondary | ICD-10-CM | POA: Diagnosis not present

## 2024-01-05 DIAGNOSIS — G4700 Insomnia, unspecified: Secondary | ICD-10-CM | POA: Diagnosis not present

## 2024-01-05 DIAGNOSIS — I2781 Cor pulmonale (chronic): Secondary | ICD-10-CM | POA: Diagnosis not present

## 2024-01-05 DIAGNOSIS — I50810 Right heart failure, unspecified: Secondary | ICD-10-CM | POA: Diagnosis not present

## 2024-01-05 DIAGNOSIS — Z9181 History of falling: Secondary | ICD-10-CM | POA: Diagnosis not present

## 2024-01-05 DIAGNOSIS — E871 Hypo-osmolality and hyponatremia: Secondary | ICD-10-CM | POA: Diagnosis not present

## 2024-01-05 DIAGNOSIS — D509 Iron deficiency anemia, unspecified: Secondary | ICD-10-CM | POA: Diagnosis not present

## 2024-01-05 DIAGNOSIS — I5033 Acute on chronic diastolic (congestive) heart failure: Secondary | ICD-10-CM | POA: Diagnosis not present

## 2024-01-05 DIAGNOSIS — I272 Pulmonary hypertension, unspecified: Secondary | ICD-10-CM | POA: Diagnosis not present

## 2024-01-05 DIAGNOSIS — I110 Hypertensive heart disease with heart failure: Secondary | ICD-10-CM | POA: Diagnosis not present

## 2024-01-06 DIAGNOSIS — I10 Essential (primary) hypertension: Secondary | ICD-10-CM | POA: Diagnosis not present

## 2024-01-06 DIAGNOSIS — Z299 Encounter for prophylactic measures, unspecified: Secondary | ICD-10-CM | POA: Diagnosis not present

## 2024-01-06 DIAGNOSIS — K644 Residual hemorrhoidal skin tags: Secondary | ICD-10-CM | POA: Diagnosis not present

## 2024-01-06 DIAGNOSIS — I5032 Chronic diastolic (congestive) heart failure: Secondary | ICD-10-CM | POA: Diagnosis not present

## 2024-01-11 DIAGNOSIS — G4700 Insomnia, unspecified: Secondary | ICD-10-CM | POA: Diagnosis not present

## 2024-01-11 DIAGNOSIS — K5900 Constipation, unspecified: Secondary | ICD-10-CM | POA: Diagnosis not present

## 2024-01-11 DIAGNOSIS — I110 Hypertensive heart disease with heart failure: Secondary | ICD-10-CM | POA: Diagnosis not present

## 2024-01-11 DIAGNOSIS — M810 Age-related osteoporosis without current pathological fracture: Secondary | ICD-10-CM | POA: Diagnosis not present

## 2024-01-11 DIAGNOSIS — I4821 Permanent atrial fibrillation: Secondary | ICD-10-CM | POA: Diagnosis not present

## 2024-01-11 DIAGNOSIS — Z9181 History of falling: Secondary | ICD-10-CM | POA: Diagnosis not present

## 2024-01-11 DIAGNOSIS — E039 Hypothyroidism, unspecified: Secondary | ICD-10-CM | POA: Diagnosis not present

## 2024-01-11 DIAGNOSIS — I50810 Right heart failure, unspecified: Secondary | ICD-10-CM | POA: Diagnosis not present

## 2024-01-11 DIAGNOSIS — Z8673 Personal history of transient ischemic attack (TIA), and cerebral infarction without residual deficits: Secondary | ICD-10-CM | POA: Diagnosis not present

## 2024-01-11 DIAGNOSIS — I272 Pulmonary hypertension, unspecified: Secondary | ICD-10-CM | POA: Diagnosis not present

## 2024-01-11 DIAGNOSIS — E119 Type 2 diabetes mellitus without complications: Secondary | ICD-10-CM | POA: Diagnosis not present

## 2024-01-11 DIAGNOSIS — D509 Iron deficiency anemia, unspecified: Secondary | ICD-10-CM | POA: Diagnosis not present

## 2024-01-11 DIAGNOSIS — K922 Gastrointestinal hemorrhage, unspecified: Secondary | ICD-10-CM | POA: Diagnosis not present

## 2024-01-11 DIAGNOSIS — Z556 Problems related to health literacy: Secondary | ICD-10-CM | POA: Diagnosis not present

## 2024-01-11 DIAGNOSIS — E871 Hypo-osmolality and hyponatremia: Secondary | ICD-10-CM | POA: Diagnosis not present

## 2024-01-11 DIAGNOSIS — I5033 Acute on chronic diastolic (congestive) heart failure: Secondary | ICD-10-CM | POA: Diagnosis not present

## 2024-01-11 DIAGNOSIS — C44722 Squamous cell carcinoma of skin of right lower limb, including hip: Secondary | ICD-10-CM | POA: Diagnosis not present

## 2024-01-11 DIAGNOSIS — I2781 Cor pulmonale (chronic): Secondary | ICD-10-CM | POA: Diagnosis not present

## 2024-01-19 DIAGNOSIS — Z556 Problems related to health literacy: Secondary | ICD-10-CM | POA: Diagnosis not present

## 2024-01-19 DIAGNOSIS — E871 Hypo-osmolality and hyponatremia: Secondary | ICD-10-CM | POA: Diagnosis not present

## 2024-01-19 DIAGNOSIS — I2781 Cor pulmonale (chronic): Secondary | ICD-10-CM | POA: Diagnosis not present

## 2024-01-19 DIAGNOSIS — D509 Iron deficiency anemia, unspecified: Secondary | ICD-10-CM | POA: Diagnosis not present

## 2024-01-19 DIAGNOSIS — I50810 Right heart failure, unspecified: Secondary | ICD-10-CM | POA: Diagnosis not present

## 2024-01-19 DIAGNOSIS — I110 Hypertensive heart disease with heart failure: Secondary | ICD-10-CM | POA: Diagnosis not present

## 2024-01-19 DIAGNOSIS — Z8673 Personal history of transient ischemic attack (TIA), and cerebral infarction without residual deficits: Secondary | ICD-10-CM | POA: Diagnosis not present

## 2024-01-19 DIAGNOSIS — I5033 Acute on chronic diastolic (congestive) heart failure: Secondary | ICD-10-CM | POA: Diagnosis not present

## 2024-01-19 DIAGNOSIS — Z9181 History of falling: Secondary | ICD-10-CM | POA: Diagnosis not present

## 2024-01-19 DIAGNOSIS — K5900 Constipation, unspecified: Secondary | ICD-10-CM | POA: Diagnosis not present

## 2024-01-19 DIAGNOSIS — M810 Age-related osteoporosis without current pathological fracture: Secondary | ICD-10-CM | POA: Diagnosis not present

## 2024-01-19 DIAGNOSIS — I272 Pulmonary hypertension, unspecified: Secondary | ICD-10-CM | POA: Diagnosis not present

## 2024-01-19 DIAGNOSIS — I4821 Permanent atrial fibrillation: Secondary | ICD-10-CM | POA: Diagnosis not present

## 2024-01-19 DIAGNOSIS — E039 Hypothyroidism, unspecified: Secondary | ICD-10-CM | POA: Diagnosis not present

## 2024-01-19 DIAGNOSIS — K922 Gastrointestinal hemorrhage, unspecified: Secondary | ICD-10-CM | POA: Diagnosis not present

## 2024-01-19 DIAGNOSIS — C44722 Squamous cell carcinoma of skin of right lower limb, including hip: Secondary | ICD-10-CM | POA: Diagnosis not present

## 2024-01-19 DIAGNOSIS — E119 Type 2 diabetes mellitus without complications: Secondary | ICD-10-CM | POA: Diagnosis not present

## 2024-01-19 DIAGNOSIS — G4700 Insomnia, unspecified: Secondary | ICD-10-CM | POA: Diagnosis not present

## 2024-01-31 ENCOUNTER — Encounter: Payer: Self-pay | Admitting: Cardiology

## 2024-01-31 ENCOUNTER — Telehealth: Payer: Self-pay | Admitting: Cardiology

## 2024-01-31 NOTE — Telephone Encounter (Signed)
 Per Dr. Debera:  I reviewed her home weights. She is up about 5 pounds since early August. Would take 2 Lasix  20 mg tablets daily through the end of this week (Friday).   Patient verbalized understanding and will drop off weights at either the [facility] or [place] office

## 2024-01-31 NOTE — Telephone Encounter (Signed)
 Pt came into [place] office and stated that she has been taking 2 Furosemide  2 days in a row a couple times lately as she feels she needs them. She  stated she has been having swelling in her ankles and legs.   Bp and weight log scanned in  Best phone number [phone]

## 2024-02-01 DIAGNOSIS — R5383 Other fatigue: Secondary | ICD-10-CM | POA: Diagnosis not present

## 2024-02-02 ENCOUNTER — Encounter: Payer: Self-pay | Admitting: Neurology

## 2024-02-02 ENCOUNTER — Ambulatory Visit (INDEPENDENT_AMBULATORY_CARE_PROVIDER_SITE_OTHER): Admitting: Neurology

## 2024-02-02 VITALS — BP 108/66 | HR 73

## 2024-02-02 DIAGNOSIS — I63411 Cerebral infarction due to embolism of right middle cerebral artery: Secondary | ICD-10-CM

## 2024-02-02 NOTE — Progress Notes (Signed)
 Shelly Fields  PATIENT: Shelly Fields DOB: 13-Feb-1935  REQUESTING CLINICIAN: Maree Bracken D, DO HISTORY FROM: Patient/Daughter and chart review  REASON FOR VISIT: Stroke follow up    HISTORICAL  CHIEF COMPLAINT:  Chief Complaint  Patient presents with   New Patient (Initial Visit)    Rm 13, with daughter in [place], NP , stroke    HISTORY OF PRESENT ILLNESS:  This is a 89 year old woman past medical history atrial fibrillation, heart failure, diabetes, hypothyroidism, who is presenting after being admitted to the hospital for a right MCA stroke.  One month prior to her hospitalization, patient suffered a severe GI bleed, since the GI bleed, Xarelto  was discontinued.  A month later she presented to the hospital after being found down on the floor.  She did have severe left-sided weakness mainly her left leg.  Her MRI showed right MCA territory stroke, and angiogram right ACA stenosis.  She was outside the window, treated with aspirin  and Plavix  for total of 90 days.  She was sent to rehab.  She recovered very well and is at home now.  She is currently on DAPT, Aspirin  and Plavix , will complete the 90 days in a couple weeks.  Denies any worsening of her symptoms overall she is doing well.    Brief Hospital course and summary  Shelly Fields is an 89 y.o. female with medical history significant of atrial fibrillation, hypothyroidism, T2DM who presents to the emergency department from home via EMS due to fall sustained at home at night and she was found the following day in the afternoon by family members.  She was noted to have significant left-sided weakness especially to her left leg.  She was admitted for evaluation of acute ischemic CVA.  Brain MRI demonstrates right MCA CVA and neurology recommending DAPT x 90 days and then aspirin  monotherapy after.  She has been assessed by physical therapy with recommendations for CIR and now has a bed available and will discharge  today.  No other acute events or concerns noted throughout the course of this admission.   Discharge Diagnoses:  Principal Problem:   Acute ischemic stroke [facility]) Active Problems:   Hypothyroidism, acquired   Persistent atrial fibrillation (HCC)   Constipation   Controlled type 2 diabetes mellitus without complication, without long-term current use of insulin  (HCC)   Anxiety   Insomnia   Fall at home, initial encounter   Abrasion of back Principal discharge diagnosis: Acute ischemic right MCA distribution CVA.  OTHER MEDICAL CONDITIONS: Atrial fibrillation, diabetes mellitus, heart failure, hypothyroidism   REVIEW OF SYSTEMS: Full 14 system review of systems performed and negative with exception of: As noted in HPI  ALLERGIES: No Known Allergies  HOME MEDICATIONS: Outpatient Medications Prior to Visit  Medication Sig Dispense Refill   acetaminophen  (TYLENOL ) 325 MG tablet Take 2 tablets (650 mg total) by mouth every 6 (six) hours as needed for mild pain (pain score 1-3) (or Fever >/= 101).     aspirin  EC 81 MG tablet Take 1 tablet (81 mg total) by mouth daily. Swallow whole. 30 tablet 12   Carboxymethylcellulose Sodium (THERATEARS OP) Place 1 drop into both eyes daily.      clopidogrel  (PLAVIX ) 75 MG tablet Take 1 tablet (75 mg total) by mouth daily. 90 tablet 3   denosumab  (PROLIA ) 60 MG/ML SOSY injection Inject 60 mg into the skin every 6 (six) months.     docusate sodium  (COLACE) 100 MG capsule Take 1 capsule (100  mg total) by mouth 2 (two) times daily. 60 capsule 2   FARXIGA  10 MG TABS tablet Take 1 tablet (10 mg total) by mouth daily before breakfast. 30 tablet 6   ferrous gluconate  (FERGON) 324 MG tablet Take 1 tablet (324 mg total) by mouth every other day. 15 tablet 0   furosemide  (LASIX ) 20 MG tablet Take 1-2 tablets (20-40 mg total) by mouth daily. Alternate 40 mg with 20 mg daily 180 tablet 3   levothyroxine  (SYNTHROID ) 75 MCG tablet Take 1 tablet (75 mcg total) by mouth  daily before breakfast. 30 tablet 0   melatonin 3 MG TABS tablet Take 1 tablet (3 mg total) by mouth at bedtime as needed. 30 tablet 0   metoprolol  succinate (TOPROL -XL) 50 MG 24 hr tablet TAKE 1 AND 1/2 TABLETS BY MOUTH  TWICE DAILY 300 tablet 2   pantoprazole  (PROTONIX ) 40 MG tablet Take 1 tablet (40 mg total) by mouth daily. 90 tablet 3   potassium chloride  SA (KLOR-CON  M) 20 MEQ tablet Take 1 tablet (20 mEq total) by mouth daily. 90 tablet 3   psyllium (METAMUCIL) 58.6 % packet Take 1 packet by mouth 2 (two) times daily. 30 each 12   No facility-administered medications prior to visit.    PAST MEDICAL HISTORY: Past Medical History:  Diagnosis Date   Cor pulmonale (chronic) (HCC)    Diverticulosis    Essential hypertension    Hypothyroidism    Internal hemorrhoids    Persistent atrial fibrillation (HCC)    Pulmonary hypertension (HCC)    Seasonal allergies    Shingles     PAST SURGICAL HISTORY: Past Surgical History:  Procedure Laterality Date   ABDOMINAL HYSTERECTOMY     BIOPSY  03/25/2023   Procedure: BIOPSY;  Surgeon: Cinderella Deatrice FALCON, MD;  [facility];  Service: Endoscopy;;   CATARACT EXTRACTION     COLONOSCOPY N/A 01/12/2013   Procedure: COLONOSCOPY;  Surgeon: Claudis RAYMOND Rivet, MD;  [facility];  Service: Endoscopy;  Laterality: N/A;  1030   COLONOSCOPY N/A 11/18/2017   Procedure: COLONOSCOPY;  Surgeon: Rivet Claudis RAYMOND, MD;  [facility];  Service: Endoscopy;  Laterality: N/A;   COLONOSCOPY WITH PROPOFOL  N/A 03/25/2023   Procedure: COLONOSCOPY WITH PROPOFOL ;  Surgeon: Cinderella Deatrice FALCON, MD;  [facility];  Service: Endoscopy;  Laterality: N/A;  11:00AM;ASA 3   ENTEROSCOPY N/A 10/31/2023   Procedure: ENTEROSCOPY;  Surgeon: Stacia Glendia BRAVO, MD;  Location: [facility] ENDOSCOPY;  Service: Gastroenterology;  Laterality: N/A;   ESOPHAGOGASTRODUODENOSCOPY (EGD) WITH PROPOFOL  N/A 03/25/2023   Procedure: ESOPHAGOGASTRODUODENOSCOPY (EGD)  WITH PROPOFOL ;  Surgeon: Cinderella Deatrice FALCON, MD;  [facility];  Service: Endoscopy;  Laterality: N/A;  11:00;ASA 3   GIVENS CAPSULE STUDY N/A 10/29/2023   Procedure: IMAGING PROCEDURE, GI TRACT, INTRALUMINAL, VIA CAPSULE;  Surgeon: Stacia Glendia BRAVO, MD;  Location: [facility] ENDOSCOPY;  Service: Gastroenterology;  Laterality: N/A;   HOT HEMOSTASIS N/A 10/31/2023   Procedure: ARGON PLASMA COAGULATION;  Surgeon: Stacia Glendia BRAVO, MD;  Location[facility] ENDOSCOPY;  Service: Gastroenterology;  Laterality: N/A;   LESION REMOVAL Right 09/13/2023   Procedure: excision of right lower extremity squamous cell carcinoma;  Surgeon: Evonnie Dorothyann LABOR, DO;  Location: [facility];  Service: General;  Laterality: Right;   POLYPECTOMY  11/18/2017   Procedure: POLYPECTOMY;  Surgeon: Rivet Claudis RAYMOND, MD;  [facility];  Service: Endoscopy;;  colon    FAMILY HISTORY: Family History  Problem Relation Age  of Onset   Heart disease Mother    Hypertension Father    Colon cancer Neg Hx     SOCIAL HISTORY: Social History   Socioeconomic History   Marital status: Widowed    Spouse name: Not on file   Number of children: Not on file   Years of education: Not on file   Highest education level: Not on file  Occupational History   Not on file  Tobacco Use   Smoking status: Never    Passive exposure: Past   Smokeless tobacco: Never  Vaping Use   Vaping status: Never Used  Substance and Sexual Activity   Alcohol use: No    Alcohol/week: 0.0 standard drinks of alcohol   Drug use: No   Sexual activity: Not on file  Other Topics Concern   Not on file  Social History Narrative   Right handed   Caffeine-none   Lives alone, still driving   Social Drivers of Health   Financial Resource Strain: Not on file  Food Insecurity: No Food Insecurity (10/21/2023)   Hunger Vital Sign    Worried About Running Out of Food in the Last Year: Never true    Ran Out of Food in the Last Year: Never true   Transportation Needs: No Transportation Needs (10/21/2023)   PRAPARE - Administrator, Civil Service (Medical): No    Lack of Transportation (Non-Medical): No  Physical Activity: Not on file  Stress: Not on file  Social Connections: Moderately Integrated (10/21/2023)   Social Connection and Isolation Panel    Frequency of Communication with Friends and Family: More than three times a week    Frequency of Social Gatherings with Friends and Family: More than three times a week    Attends Religious Services: 1 to 4 times per year    Active Member of [employer] or Organizations: Patient declined    Attends Banker Meetings: 1 to 4 times per year    Marital Status: Widowed  Intimate Partner Violence: Not At Risk (10/21/2023)   Humiliation, Afraid, Rape, and Kick questionnaire    Fear of Current or Ex-Partner: No    Emotionally Abused: No    Physically Abused: No    Sexually Abused: No    PHYSICAL EXAM  GENERAL EXAM/CONSTITUTIONAL: Vitals:  Vitals:   02/02/24 1303  BP: 108/66  Pulse: 73  SpO2: 99%   There is no height or weight on file to calculate BMI. Wt Readings from Last 3 Encounters:  12/16/23 95 lb (43.1 kg)  11/24/23 98 lb 3.2 oz (44.5 kg)  11/18/23 98 lb (44.5 kg)   Patient is in no distress; well developed, nourished and groomed; neck is supple  MUSCULOSKELETAL: Gait, strength, tone, movements noted in Neurologic exam below  NEUROLOGIC: MENTAL STATUS:      No data to display         awake, alert, oriented to person, place and time recent and remote memory intact normal attention and concentration language fluent, comprehension intact, naming intact fund of knowledge appropriate  CRANIAL NERVE:  2nd, 3rd, 4th, 6th - Visual fields full to confrontation, extraocular muscles intact, no nystagmus 5th - facial sensation symmetric 7th - facial strength symmetric 8th - hearing intact 9th - palate elevates symmetrically, uvula midline 11th -  shoulder shrug symmetric 12th - tongue protrusion midline  MOTOR:  normal bulk and tone, full strength in the BUE, BLE  SENSORY:  normal and symmetric to light touch  COORDINATION:  finger-nose-finger, fine finger movements normal  GAIT/STATION:  Ambulate with a cane     DIAGNOSTIC DATA (LABS, IMAGING, TESTING) - I reviewed patient records, labs, notes, testing and imaging myself where available.  Lab Results  Component Value Date   WBC 6.6 11/02/2023   HGB 8.9 (L) 11/04/2023   HCT 28.9 (L) 11/04/2023   MCV 89.8 11/02/2023   PLT 339 11/02/2023      Component Value Date/Time   NA 127 (L) 11/03/2023 0524   K 4.1 11/03/2023 0524   CL 98 11/03/2023 0524   CO2 25 11/03/2023 0524   GLUCOSE 96 11/03/2023 0524   BUN 13 11/03/2023 0524   CREATININE 0.82 11/03/2023 0524   CREATININE 0.81 05/17/2020 1151   CALCIUM 8.4 (L) 11/03/2023 0524   PROT 5.9 (L) 10/27/2023 0507   ALBUMIN  2.8 (L) 10/27/2023 0507   AST 17 10/27/2023 0507   ALT 12 10/27/2023 0507   ALKPHOS 65 10/27/2023 0507   BILITOT 0.7 10/27/2023 0507   GFRNONAA >60 11/03/2023 0524   Lab Results  Component Value Date   CHOL 112 10/22/2023   HDL 44 10/22/2023   LDLCALC 59 10/22/2023   TRIG 43 10/22/2023   CHOLHDL 2.5 10/22/2023   Lab Results  Component Value Date   HGBA1C 5.2 10/21/2023   Lab Results  Component Value Date   VITAMINB12 1,567 (H) 02/17/2023   Lab Results  Component Value Date   TSH 11.008 (H) 09/19/2023    MRI Brain 10/22/2023 Acute right MCA territory infarcts involving the right insula and overlying frontal and parietal lobes. No substantial mass effect.  CTA Head and Neck 10/21/2023 1. Occlusion of a distal M2 branch of the right MCA. 2. No hemodynamically significant stenosis of the carotid arteries. 3. Mild stenosis of the left vertebral artery origin due to atherosclerotic calcification.    ASSESSMENT AND PLAN  89 y.o. year old female with vascular risk factor including  atrial fibrillation, heart failure, diabetes mellitus, who is presenting following a right MCA stroke.  This was in the setting of discontinuing her Xarelto  due to GI bleed.  Patient's stroke etiology likely cardioembolic.  She is currently on DAPT, Aspirin  and Plavix  for a total of 90-days.  My plan will be to increase aspirin  to 162 mg once she completed DAPT and to monitor for GI bleed.  She recovered well, has full strength but does use a cane with ambulation.  Advised her to continue with her exercise plan.  Continue current medications, continue follow-up PCP and have your TSH rechecked and return as needed.   1. Cerebrovascular accident (CVA) due to embolism of right middle cerebral artery [facility])      Patient Instructions  Continue with Aspirin  and Plavix  for total of 90 days Once she discontinue Plavix , increase aspirin  to 2 tablets daily, 162 mg Please monitor for GI bleed Continue your other medications Please follow-up with your PCP and have your TSH rechecked Continue follow-up with your doctors Return as needed  No orders of the defined types were placed in this encounter.   No orders of the defined types were placed in this encounter.   Return if symptoms worsen or fail to improve.  I have spent a total of 60 minutes dedicated to this patient today, preparing to see patient, performing a medically appropriate examination and evaluation, ordering tests and/or medications and procedures, and counseling and educating the patient/family/caregiver; independently interpreting result and communicating results to the family/patient/caregiver; and documenting clinical information in the electronic  medical record.   Pastor Falling, MD 02/02/2024, 1:49 PM  Shelly Fields [address] [place], [place] [address] [phone]

## 2024-02-02 NOTE — Patient Instructions (Addendum)
 Continue with Aspirin  and Plavix  for total of 90 days Once she discontinue Plavix , increase aspirin  to 2 tablets daily, 162 mg Please monitor for GI bleed Continue your other medications Please follow-up with your PCP and have your TSH rechecked Continue follow-up with your doctors Return as needed

## 2024-02-04 ENCOUNTER — Encounter: Payer: Self-pay | Admitting: Cardiology

## 2024-02-11 DIAGNOSIS — D649 Anemia, unspecified: Secondary | ICD-10-CM | POA: Diagnosis not present

## 2024-02-11 DIAGNOSIS — I4891 Unspecified atrial fibrillation: Secondary | ICD-10-CM | POA: Diagnosis not present

## 2024-02-11 DIAGNOSIS — Z79899 Other long term (current) drug therapy: Secondary | ICD-10-CM | POA: Diagnosis not present

## 2024-02-11 DIAGNOSIS — I10 Essential (primary) hypertension: Secondary | ICD-10-CM | POA: Diagnosis not present

## 2024-02-11 DIAGNOSIS — I781 Nevus, non-neoplastic: Secondary | ICD-10-CM | POA: Diagnosis not present

## 2024-02-11 DIAGNOSIS — J449 Chronic obstructive pulmonary disease, unspecified: Secondary | ICD-10-CM | POA: Diagnosis not present

## 2024-02-11 DIAGNOSIS — Z299 Encounter for prophylactic measures, unspecified: Secondary | ICD-10-CM | POA: Diagnosis not present

## 2024-02-14 ENCOUNTER — Encounter: Payer: Self-pay | Admitting: Internal Medicine

## 2024-03-07 ENCOUNTER — Ambulatory Visit: Attending: Cardiology | Admitting: Cardiology

## 2024-03-07 ENCOUNTER — Encounter: Payer: Self-pay | Admitting: Cardiology

## 2024-03-07 VITALS — BP 138/70 | HR 100 | Ht 62.0 in | Wt 99.0 lb

## 2024-03-07 DIAGNOSIS — I5032 Chronic diastolic (congestive) heart failure: Secondary | ICD-10-CM

## 2024-03-07 DIAGNOSIS — Z8673 Personal history of transient ischemic attack (TIA), and cerebral infarction without residual deficits: Secondary | ICD-10-CM

## 2024-03-07 DIAGNOSIS — I4821 Permanent atrial fibrillation: Secondary | ICD-10-CM | POA: Diagnosis not present

## 2024-03-07 MED ORDER — FUROSEMIDE 20 MG PO TABS: ORAL_TABLET | ORAL | 3 refills | Status: DC

## 2024-03-07 MED ORDER — METOPROLOL SUCCINATE ER 100 MG PO TB24: 100.0000 mg | ORAL_TABLET | Freq: Two times a day (BID) | ORAL | 3 refills | Status: AC

## 2024-03-07 NOTE — Patient Instructions (Signed)
 Medication Instructions:   INCREASE Metoprolol  to 100 mg Twice a day  Take Lasix  20 mg daily alternating with 40 mg every other day  STOP Plavix   Labwork: None today  Testing/Procedures: None today  Follow-Up: 3 months  Any Other Special Instructions Will Be Listed Below (If Applicable).  If you need a refill on your cardiac medications before your next appointment, please call your pharmacy.

## 2024-03-07 NOTE — Progress Notes (Signed)
    Cardiology Office Note  Date: 03/07/2024   ID: Jaydyn, Menon [birth year], MRN [id number]  History of Present Illness: Shelly Fields is an 89 y.o. female last seen in June.  She is here today with her son for a follow-up visit.  We reviewed her home vital sign checks, blood pressure reasonable and weight fluctuating by a few pounds.  She continues to have trouble with recurring leg swelling, right greater than left.  She has been taking Lasix  40 mg daily for the last week.  She reports having recurrent bleeding from skin abrasions, prior biopsy spot mainly on her right lower leg.  This can happen when she rubs her skin in the shower, sometimes spontaneously.  We went over her medications in detail and discussed adjustments.  She is over 90 days out from stroke.  I reviewed her recent lab work.  Heart rate checked by me today on examination was around 100 in atrial fibrillation.  Physical Exam: VS:  BP 138/70 (BP Location: Right Arm, Cuff Size: Small)   Pulse 100   Ht 5' 2 (1.575 m)   Wt 99 lb (44.9 kg)   SpO2 100%   BMI 18.11 kg/m , BMI Body mass index is 18.11 kg/m.  Wt Readings from Last 3 Encounters:  03/07/24 99 lb (44.9 kg)  12/16/23 95 lb (43.1 kg)  11/24/23 98 lb 3.2 oz (44.5 kg)    General: Patient appears comfortable at rest. HEENT: Conjunctiva and lids normal. Neck: Supple, no elevated JVP or carotid bruits. Lungs: Clear to auscultation, nonlabored breathing at rest. Cardiac: Irregularly irregular with 2/6 stock murmur and no gallop. Extremities: Removed dressing from right lower leg, no active bleeding with venous stasis.  ECG:  An ECG dated 10/21/2023 was personally reviewed today and demonstrated:  Atrial fibrillation with rightward axis.  Labwork: 09/16/2023: B Natriuretic Peptide 636.0 09/19/2023: TSH 11.008 10/23/2023: Magnesium 2.0 10/27/2023: ALT 12; AST 17 11/02/2023: Platelets 339 11/03/2023: BUN 13; Creatinine, Ser 0.82; Potassium 4.1; Sodium  127 11/04/2023: Hemoglobin 8.9     Component Value Date/Time   CHOL 112 10/22/2023 0443   TRIG 43 10/22/2023 0443   HDL 44 10/22/2023 0443   CHOLHDL 2.5 10/22/2023 0443   VLDL 9 10/22/2023 0443   LDLCALC 59 10/22/2023 0443  September 2025: Hemoglobin 9.4, platelets 207, BUN 14, creatinine 0.87, GFR 65, potassium 4.1, AST 20, ALT 12  Other Studies Reviewed Today:  No interval cardiac testing for review today.  Assessment and Plan:  1.  Permanent atrial fibrillation with CHA2DS2-VASc score of 4.  No longer on anticoagulation given severe GI bleed.  Heart rate not optimally controlled, plan to increase Toprol -XL to 100 mg twice daily (previously taking 75 mg twice daily).  Prescription updated.   2.  HFpEF with LVEF 70 to 75% by echocardiogram in April, mild RV dysfunction also noted.  Plan to increase Lasix  to 20 mg alternating with 40 mg every other day and continue Farxiga  10 mg daily.  She is also on potassium supplements.  Recent renal function and potassium normal.   3.  Primary hypertension.  Continue to track blood pressure at home.   4.  History of stroke.  She is greater than 90 days out from event, recommending that she stop Plavix  at this point particularly with skin bleeds.  Continue aspirin  81 mg daily.  Disposition:  Follow up 3 months.  Signed, Jayson JUDITHANN Sierras, M.D., F.A.C.C. [facility]

## 2024-03-10 DIAGNOSIS — I10 Essential (primary) hypertension: Secondary | ICD-10-CM | POA: Diagnosis not present

## 2024-03-10 DIAGNOSIS — Z23 Encounter for immunization: Secondary | ICD-10-CM | POA: Diagnosis not present

## 2024-03-10 DIAGNOSIS — I5032 Chronic diastolic (congestive) heart failure: Secondary | ICD-10-CM | POA: Diagnosis not present

## 2024-03-10 DIAGNOSIS — K5900 Constipation, unspecified: Secondary | ICD-10-CM | POA: Diagnosis not present

## 2024-03-10 DIAGNOSIS — Z299 Encounter for prophylactic measures, unspecified: Secondary | ICD-10-CM | POA: Diagnosis not present

## 2024-03-10 DIAGNOSIS — I8391 Asymptomatic varicose veins of right lower extremity: Secondary | ICD-10-CM | POA: Diagnosis not present

## 2024-03-14 DIAGNOSIS — I5032 Chronic diastolic (congestive) heart failure: Secondary | ICD-10-CM | POA: Diagnosis not present

## 2024-03-14 DIAGNOSIS — Z299 Encounter for prophylactic measures, unspecified: Secondary | ICD-10-CM | POA: Diagnosis not present

## 2024-03-14 DIAGNOSIS — D649 Anemia, unspecified: Secondary | ICD-10-CM | POA: Diagnosis not present

## 2024-03-14 DIAGNOSIS — I10 Essential (primary) hypertension: Secondary | ICD-10-CM | POA: Diagnosis not present

## 2024-03-14 DIAGNOSIS — J449 Chronic obstructive pulmonary disease, unspecified: Secondary | ICD-10-CM | POA: Diagnosis not present

## 2024-03-15 ENCOUNTER — Encounter: Payer: Self-pay | Admitting: Internal Medicine

## 2024-03-16 ENCOUNTER — Other Ambulatory Visit: Payer: Self-pay | Admitting: *Deleted

## 2024-03-16 DIAGNOSIS — I781 Nevus, non-neoplastic: Secondary | ICD-10-CM

## 2024-03-17 ENCOUNTER — Ambulatory Visit (HOSPITAL_COMMUNITY)
Admission: RE | Admit: 2024-03-17 | Discharge: 2024-03-17 | Disposition: A | Discharge status: Home / Self Care | Source: Ambulatory Visit | Attending: Vascular Surgery | Admitting: Vascular Surgery

## 2024-03-17 ENCOUNTER — Encounter: Payer: Self-pay | Admitting: Gastroenterology

## 2024-03-17 DIAGNOSIS — I781 Nevus, non-neoplastic: Secondary | ICD-10-CM | POA: Insufficient documentation

## 2024-03-22 ENCOUNTER — Telehealth: Payer: Self-pay | Admitting: Cardiology

## 2024-03-22 DIAGNOSIS — Z299 Encounter for prophylactic measures, unspecified: Secondary | ICD-10-CM | POA: Diagnosis not present

## 2024-03-22 DIAGNOSIS — I10 Essential (primary) hypertension: Secondary | ICD-10-CM | POA: Diagnosis not present

## 2024-03-22 DIAGNOSIS — N1831 Chronic kidney disease, stage 3a: Secondary | ICD-10-CM | POA: Diagnosis not present

## 2024-03-22 DIAGNOSIS — I5032 Chronic diastolic (congestive) heart failure: Secondary | ICD-10-CM | POA: Diagnosis not present

## 2024-03-22 DIAGNOSIS — I272 Pulmonary hypertension, unspecified: Secondary | ICD-10-CM | POA: Diagnosis not present

## 2024-03-22 NOTE — Telephone Encounter (Signed)
 Patient is returning call.

## 2024-03-22 NOTE — Telephone Encounter (Signed)
 Shelly Fields has walked into the office stating that she just seen Dr. Rosamond. She has been retaining fluid in her legs with coughing.   Dr. Rosamond told her to hold Furosemide  and start on Toresemide 20 mg for 3 days (2 in the AM and 1 at noon) after that 2 a day. Dr. Rosamond requested that patient check with Dr. Debera regarding this matter.States that she is fatigue.

## 2024-03-23 NOTE — Telephone Encounter (Signed)
 Left a message for patient to call office regarding fluid retention.

## 2024-03-24 NOTE — Telephone Encounter (Signed)
 Left message for patient to return call.

## 2024-03-27 NOTE — Telephone Encounter (Signed)
 Multiple attempts have been made to contact patient - unsuccessful.

## 2024-03-29 DIAGNOSIS — I5032 Chronic diastolic (congestive) heart failure: Secondary | ICD-10-CM | POA: Diagnosis not present

## 2024-03-29 DIAGNOSIS — M810 Age-related osteoporosis without current pathological fracture: Secondary | ICD-10-CM | POA: Diagnosis not present

## 2024-04-04 ENCOUNTER — Ambulatory Visit (INDEPENDENT_AMBULATORY_CARE_PROVIDER_SITE_OTHER): Admitting: Audiology

## 2024-04-04 ENCOUNTER — Telehealth (INDEPENDENT_AMBULATORY_CARE_PROVIDER_SITE_OTHER): Payer: Self-pay

## 2024-04-04 NOTE — Telephone Encounter (Signed)
 Pt called stating she can not make her appt on 11/28 was asking if she could get in sooner to see what is going on with her hearing aid.

## 2024-05-01 ENCOUNTER — Telehealth (INDEPENDENT_AMBULATORY_CARE_PROVIDER_SITE_OTHER): Payer: Self-pay

## 2024-05-01 NOTE — Telephone Encounter (Signed)
 Patient called leaving a voicemail wanting to confirm her appointment for the 28th. I called the patient back and informed her yes her appointment was on the 28th and 930 patient understood.

## 2024-05-05 ENCOUNTER — Ambulatory Visit (INDEPENDENT_AMBULATORY_CARE_PROVIDER_SITE_OTHER): Admitting: Audiology

## 2024-05-05 DIAGNOSIS — H903 Sensorineural hearing loss, bilateral: Secondary | ICD-10-CM | POA: Diagnosis not present

## 2024-05-05 NOTE — Progress Notes (Signed)
 [address] [place], [place] [address] [phone]  Audiological Evaluation    Name: Shelly Fields     DOB:   10-15-1934      MRN:   [id number]                                                                                     Service Date: 05/05/2024     Accompanied by: daughter-in-law   Patient comes today after Dr. Karis, ENT sent a referral for a hearing evaluation due to concerns with hearing loss.   Symptoms Yes Details  Hearing loss  [x]  Patient reads lips. Significant difficulty hearing. Previous test was at Dr. Rojean clinic in 2023  Tinnitus  []    Ear pain/ infections/pressure  []    Balance problems  []    Noise exposure history  []    Previous ear surgeries  []    Family history of hearing loss  []    Amplification  [x]  Has a set of Phonak receiver in the canal aids with molds.  Other  []      Otoscopy: Right ear: Clear external ear canal and notable landmarks visualized on the tympanic membrane. Left ear:  Clear external ear canal and notable landmarks visualized on the tympanic membrane.  Tympanometry: Right ear: Normal external ear canal volume with normal middle ear pressure and tympanic membrane compliance (Type A). Findings are suggestive of normal middle ear function. Left ear: Normal external ear canal volume with normal middle ear pressure and tympanic membrane compliance (Type A). Findings are suggestive of normal middle ear function.  Hearing Evaluation The hearing test results were completed under headphones and results are deemed to be of good reliability. Test technique:  conventional    Pure tone Audiometry: Right ear- Moderate to profound sensorineural hearing loss from [phone] Hz Left ear-  Moderately-severe to profound sensorineural hearing loss from [phone] Hz  Speech Audiometry: Right ear- Speech Reception Threshold (SRT) was obtained at 75 dBHL. Left ear-Speech Reception Threshold (SRT) was obtained at 80 dBHL.   Word  Recognition Score Tested using NU-6 (recorded) Right ear: 14% was obtained at a presentation level of 95 dBHL with contralateral masking which is deemed as  poor. Left ear: 12% was obtained at a presentation level of 95 dBHL with contralateral masking which is deemed as  poor.   Impression: There is a significant difference in pure-tone thresholds between ears in the higher frequencies.  There is not a significant difference in the word recognition score in between ears.    Recommendations: Follow up with ENT.   Return for a hearing evaluation if concerns with hearing changes arise or per MD recommendation.  During today's courtesy cleaning, the patient was informed that future service needs should be addressed through a scheduled appointment or by using the drop-off system. The patient understands that future appointments will incur a service fee. It is recommended that real ear measurements (REM) be completed at the next scheduled hearing aid check appointment to verify and optimize amplification settings according to best practice standards. Recommend considering new hearing aids.  A list of hearing aid clinic that are nearby were provided to the patient as  we currently are not dispensing hearing aids.   Shelly Fields Shelly Fields, AUD

## 2024-05-10 NOTE — Progress Notes (Unsigned)
 GI Office Note    Referring Provider: Rosamond Leta NOVAK, MD Primary Care Physician:  Rosamond Leta NOVAK, MD Primary Gastroenterologist: Muhammad Faizan Ahmed, MD  Date:  05/11/2024  ID:  Shelly Fields, DOB 1935/05/19, MRN [id number]   Chief Complaint   Chief Complaint  Patient presents with   Follow-up    Follow up. Still having issues with hemorrhoids    History of Present Illness  Shelly Fields is a 89 y.o. female with a history of HTN, hypothyroidism, persistent A-fib on Xarelto , pulmonary hypertension, HFpEF, diverticulosis, and chronic cor pulmonale presenting today with complaint of hemorrhoids.   OV September 2024.  At that point she reported worsening peripheral edema, seeing fresh blood upon wiping with bowel movements though denies large amounts.  Reporting Bristol 4 stools and taking Metamucil daily as well as Senokot.  Reported good appetite.  She also reported some recent hematuria.  Given her IDA she was recommended to have bidirectional endoscopy for further evaluation given her outpatient hemoglobin went from 11.5-8.4.  She was advised to take MiraLAX  twice daily for a week then reduce to once daily and continue her Metamucil, increasing to twice daily.  Advised abdominal ultrasound given anasarca noted by office visit.  EGD October 2024: - Normal esophagus.  - Gastroesophageal flap valve classified as Hill Grade III ( minimal fold, loose to endoscope,hiatal hernia likely) .  - Gastritis. Biopsied.  - Normal duodenal bulb and second portion of the duodenum.   Colonoscopy October 2024: - Hemorrhoids found on perianal exam.  - Diverticulosis in the left colon.  - Bleeding external hemorrhoids.  - Non- bleeding internal hemorrhoids.  - The examined portion of the ileum was normal. - Likely cause of hematochezia and IDA is Hemorrhoidal bleed - Use p.o iron  every other day, referred to colorectal surgery - anusol  BID and preparation H for 5 days - high fiber diet and  metamucil advised   Seen inpatient April 2025 for severe anemia, hemoglobin 5.9.  Given she was having respiratory difficulties during this time, endoscopic procedures were not pursued.  Given she had been on Xarelto  for A-fib, she was given Andexxa  for reversal.  It was suspected while inpatient that she likely had a slow chronic ooze from her hemorrhoids over the last 3 months given she had appropriate response to transfusion.  Labs for hemolytic anemia ordered including LDH and haptoglobin.  Increased iron  to once daily.  Given she had grade 4 hemorrhoids and an anal lesion, advised that malignancy needed to be excluded therefore she was advised to follow-up with colorectal surgery.  He again advised follow-up with general surgery or colorectal surgery for further treatment of hemorrhoids/anal canal lesion.  Also advised to rule out H. pylori by stopping PPI for 2-3 weeks and then performing breath test.    Last office visit Oct 11, 2023.  She reported having perianal swelling and was having fatigue and dyspnea on exertion and also not wanting to eat.  She did note that overall she was feeling better than previously and more back to her normal being more active, having home health PT and nurse, and walking once a week with a group with performing her church.  Gave her a copy of her lab work, denies chest pain shortness of breath syncope and presyncope.  She is post to have an appointment with Dr. Debby with [facility] surgery in [place] on May 21.  Hospitalized 10/26/23-11/02/23 at [facility] and had previously been admitted to [facility]  5/15-5/20/25. GI consulted 10/28/23 and underwent givens capsule and enteroscopy as outlined below.   Capsule endoscopy 10/29/23 - complete study, few streaks of heme in the stomach, active bleeding within 1 minute past first duodenal image but unable to visualize culprit. No other melan or active bleeding identified.   Enteroscopy 10/31/23: - A pill was found in  the esophagus.  - Mild Schatzki ring.  - 4 cm hiatal hernia.  - A single non- bleeding angioectasia in the duodenum. This corresponds to the bleeding noted on VCE and is the etiology of her acute blood loss anemia and recurrent iron  deficiency anemia. Treated with argon plasma coagulation ( APC) . No other AVMs noted.  - No specimens collected. - Recommended PPI BID for 4 weeks and wait 2 days to resume plavix .   Labs October 2025: Hgb 9, MCV 84, WBC 3, MCH 24.4, MCHC 28.9  Today:  Discussed the use of AI scribe software for clinical note transcription with the patient, who gave verbal consent to proceed.  She has a long-standing history of hemorrhoids, described as 'little blisters', with occasional bleeding and discomfort. She uses triamcinolone and Preparation H for relief, applying the latter internally. Although there is no current bleeding, she experiences discomfort. A surgical evaluation was delayed due to a stroke in May.  In May, she experienced a stroke and underwent rehabilitation at [facility]. She previously underwent a procedure involving swallowing a camera to examine her small intestine due to bleeding, which was addressed during the procedure. She continues to take pantoprazole  to manage stomach acid and prevent further bleeding.  She recently had a sore throat, possibly viral, after exposure to family members. She used Tylenol  and saltwater gargles for relief and reports improvement. No significant ongoing symptoms are noted.  She is scheduled for a procedure on her leg vein, initially planned for January but moved up to tomorrow. She is unsure of the exact details but mentions prior ultrasound evaluation of the leg.  She discusses her bowel habits, noting regular use of Metamucil to maintain soft stools and prevent constipation, which helps manage her hemorrhoids. She occasionally uses Senna as needed.  She mentions needing new hearing aids and has an appointment in [place]  for this. She expresses gratitude for the care she receives and mentions frequent visits to medical facilities recently.        Wt Readings from Last 6 Encounters:  05/11/24 88 lb 6.4 oz (40.1 kg)  03/07/24 99 lb (44.9 kg)  12/16/23 95 lb (43.1 kg)  11/24/23 98 lb 3.2 oz (44.5 kg)  11/18/23 98 lb (44.5 kg)  11/04/23 88 lb 6.5 oz (40.1 kg)    Body mass index is 16.17 kg/m.   Current Outpatient Medications  Medication Sig Dispense Refill   acetaminophen  (TYLENOL ) 325 MG tablet Take 2 tablets (650 mg total) by mouth every 6 (six) hours as needed for mild pain (pain score 1-3) (or Fever >/= 101).     aspirin  EC 81 MG tablet Take 1 tablet (81 mg total) by mouth daily. Swallow whole. 30 tablet 12   Carboxymethylcellulose Sodium (THERATEARS OP) Place 1 drop into both eyes daily.      denosumab  (PROLIA ) 60 MG/ML SOSY injection Inject 60 mg into the skin every 6 (six) months.     docusate sodium  (COLACE) 100 MG capsule Take 1 capsule (100 mg total) by mouth 2 (two) times daily. 60 capsule 2   FARXIGA  10 MG TABS tablet Take 1 tablet (10 mg total)  by mouth daily before breakfast. 30 tablet 6   ferrous gluconate  (FERGON) 324 MG tablet Take 1 tablet (324 mg total) by mouth every other day. 15 tablet 0   levothyroxine  (SYNTHROID ) 75 MCG tablet Take 1 tablet (75 mcg total) by mouth daily before breakfast. 30 tablet 0   melatonin 3 MG TABS tablet Take 1 tablet (3 mg total) by mouth at bedtime as needed. 30 tablet 0   metoprolol  succinate (TOPROL -XL) 100 MG 24 hr tablet Take 1 tablet (100 mg total) by mouth in the morning and at bedtime. Take with or immediately following a meal. 180 tablet 3   pantoprazole  (PROTONIX ) 40 MG tablet Take 1 tablet (40 mg total) by mouth daily. 90 tablet 3   potassium chloride  SA (KLOR-CON  M) 20 MEQ tablet Take 1 tablet (20 mEq total) by mouth daily. 90 tablet 3   psyllium (METAMUCIL) 58.6 % packet Take 1 packet by mouth 2 (two) times daily. 30 each 12   torsemide   (DEMADEX ) 20 MG tablet Take 20 mg by mouth daily.     furosemide  (LASIX ) 20 MG tablet Take 20 mg daily alternating with 40 mg every other day (Patient not taking: Reported on 05/11/2024) 135 tablet 3   No current facility-administered medications for this visit.    Past Medical History:  Diagnosis Date   Cor pulmonale (chronic) (HCC)    Diverticulosis    Essential hypertension    Hypothyroidism    Internal hemorrhoids    Persistent atrial fibrillation (HCC)    Pulmonary hypertension (HCC)    Seasonal allergies    Shingles     Past Surgical History:  Procedure Laterality Date   ABDOMINAL HYSTERECTOMY     BIOPSY  03/25/2023   Procedure: BIOPSY;  Surgeon: Cinderella Deatrice FALCON, MD;  [facility];  Service: Endoscopy;;   CATARACT EXTRACTION     COLONOSCOPY N/A 01/12/2013   Procedure: COLONOSCOPY;  Surgeon: Claudis RAYMOND Rivet, MD;  Location: [facility];  Service: Endoscopy;  Laterality: N/A;  1030   COLONOSCOPY N/A 11/18/2017   Procedure: COLONOSCOPY;  Surgeon: Rivet Claudis RAYMOND, MD;  Location: [facility];  Service: Endoscopy;  Laterality: N/A;   COLONOSCOPY WITH PROPOFOL  N/A 03/25/2023   Procedure: COLONOSCOPY WITH PROPOFOL ;  Surgeon: Cinderella Deatrice FALCON, MD;  [facility]: [facility];  Service: Endoscopy;  Laterality: N/A;  11:00AM;ASA 3   ENTEROSCOPY N/A 10/31/2023   Procedure: ENTEROSCOPY;  Surgeon: Stacia Glendia BRAVO, MD;  Location: [facility] ENDOSCOPY;  Service: Gastroenterology;  Laterality: N/A;   ESOPHAGOGASTRODUODENOSCOPY (EGD) WITH PROPOFOL  N/A 03/25/2023   Procedure: ESOPHAGOGASTRODUODENOSCOPY (EGD) WITH PROPOFOL ;  Surgeon: Cinderella Deatrice FALCON, MD;  [facility];  Service: Endoscopy;  Laterality: N/A;  11:00;ASA 3   GIVENS CAPSULE STUDY N/A 10/29/2023   Procedure: IMAGING PROCEDURE, GI TRACT, INTRALUMINAL, VIA CAPSULE;  Surgeon: Stacia Glendia BRAVO, MD;  Location: [facility] ENDOSCOPY;  Service: Gastroenterology;  Laterality: N/A;   HOT HEMOSTASIS N/A 10/31/2023   Procedure:  ARGON PLASMA COAGULATION;  Surgeon: Stacia Glendia BRAVO, MD;  Location: [facility] ENDOSCOPY;  Service: Gastroenterology;  Laterality: N/A;   LESION REMOVAL Right 09/13/2023   Procedure: excision of right lower extremity squamous cell carcinoma;  Surgeon: Evonnie Dorothyann LABOR, DO;  Location: [facility];  Service: General;  Laterality: Right;   POLYPECTOMY  11/18/2017   Procedure: POLYPECTOMY;  Surgeon: Rivet Claudis RAYMOND, MD;  [facility]: [facility];  Service: Endoscopy;;  colon    Family History  Problem Relation Age of Onset  Heart disease Mother    Hypertension Father    Colon cancer Neg Hx     Allergies as of 05/11/2024   (No Known Allergies)    Social History   Socioeconomic History   Marital status: Widowed    Spouse name: Not on file   Number of children: Not on file   Years of education: Not on file   Highest education level: Not on file  Occupational History   Not on file  Tobacco Use   Smoking status: Never    Passive exposure: Past   Smokeless tobacco: Never  Vaping Use   Vaping status: Never Used  Substance and Sexual Activity   Alcohol use: No    Alcohol/week: 0.0 standard drinks of alcohol   Drug use: No   Sexual activity: Not on file  Other Topics Concern   Not on file  Social History Narrative   Right handed   Caffeine-none   Lives alone, still driving   Social Drivers of Health   Financial Resource Strain: Not on file  Food Insecurity: No Food Insecurity (10/21/2023)   Hunger Vital Sign    Worried About Running Out of Food in the Last Year: Never true    Ran Out of Food in the Last Year: Never true  Transportation Needs: No Transportation Needs (10/21/2023)   PRAPARE - Administrator, Civil Service (Medical): No    Lack of Transportation (Non-Medical): No  Physical Activity: Not on file  Stress: Not on file  Social Connections: Moderately Integrated (10/21/2023)   Social Connection and Isolation Panel    Frequency of Communication with Friends  and Family: More than three times a week    Frequency of Social Gatherings with Friends and Family: More than three times a week    Attends Religious Services: 1 to 4 times per year    Active Member of [employer] or Organizations: Patient declined    Attends Banker Meetings: 1 to 4 times per year    Marital Status: Widowed    Review of Systems   Gen: Denies fever, chills, anorexia. Denies fatigue, weight loss.  CV: Denies chest pain, palpitations, syncope, peripheral edema, and claudication. Resp: Denies dyspnea at rest, cough, wheezing, coughing up blood, and pleurisy. GI: See HPI Derm: + leg wounds and dry skin. Denies rash Psych: Denies depression, anxiety, memory loss, confusion. No homicidal or suicidal ideation.  Heme: + bleeding. Denies bruising, bleeding, and enlarged lymph nodes. + weakness  Physical Exam   BP (!) 144/73 (BP Location: Right Arm, Patient Position: Sitting, Cuff Size: Normal)   Pulse 91   Temp 97.6 F (36.4 C) (Temporal)   Ht 5' 2 (1.575 m)   Wt 88 lb 6.4 oz (40.1 kg)   BMI 16.17 kg/m   General:   Alert and oriented. No distress noted. Pleasant and cooperative.  Head:  Normocephalic and atraumatic. Eyes:  Conjuctiva clear without scleral icterus. Mouth:  Oral mucosa pink and moist. Mild erythema to posterior oropharynx.  Rectal: evidence of external hemorrhoids and grade 3 and grade 4 internal hemorrhoids. No active bleeding. Decreased rectal tone. No rectal mass felt. Significant redness along the rectum and with the prolapsing tissue. Chaperone: Charmaine Patch CMA  Msk:  Normal posture. Weak gati, using single point cane.  Extremities:  RLE with vascular ulcer/wound.  Neurologic:  Alert and  oriented x4 Psych:  Alert and cooperative. Normal mood and affect.  Assessment & Plan  Shelly Fields is a  89 y.o. female presenting today with complaint of hemorrhoids.     Grade 3 and 4 Hemorrhoids with history of bleeding, mild constipation.   Chronic hemorrhoids with previous bleeding episodes. Currently, no active bleeding but discomfort persists. Previous surgical consultation was delayed due to a stroke. Examination reveals some tissue protrusion, which may require surgical intervention. Differential diagnosis includes other potential causes of bleeding, necessitating further evaluation. Using metamucil BID and senna as needed with good results for constipation.  - Continue using triamcinolone and Preparation H for symptomatic relief. - Referred to surgeon in [place] for evaluation and potential biopsy to further assess hemorrhoids and anal lesion.  - Advised to maintain soft stools to prevent irritation of hemorrhoids.Continue to use senna and metamucil.   Gastrointestinal bleeding, small bowel source; IDA Previous gastrointestinal bleeding from a small bowel source, managed with pantoprazole  to reduce acid levels and prevent further bleeding. No current bleeding reported. No melena. Hgb stable at 9 on last check. Maintained on oral iron  - Continue pantoprazole  to reduce stomach acid and protect the small bowel. - Continue iron  daily.  - Monitor hemoglobin levels at minimum every 3 months to ensure no further bleeding. - Requested copies of blood work results from primary care physician for ongoing monitoring.  Gastroesophageal reflux disease Chronic gastroesophageal reflux disease managed with pantoprazole . No current symptoms reported. - Continue pantoprazole  40 mg daily for acid reduction.      Follow up   Follow up 6 months, sooner if needed.     Charmaine Melia, MSN, FNP-BC, AGACNP-BC [facility]

## 2024-05-11 ENCOUNTER — Encounter: Payer: Self-pay | Admitting: Gastroenterology

## 2024-05-11 ENCOUNTER — Ambulatory Visit: Admitting: Gastroenterology

## 2024-05-11 VITALS — BP 144/73 | HR 91 | Temp 97.6°F | Ht 62.0 in | Wt 88.4 lb

## 2024-05-11 DIAGNOSIS — K219 Gastro-esophageal reflux disease without esophagitis: Secondary | ICD-10-CM

## 2024-05-11 DIAGNOSIS — D509 Iron deficiency anemia, unspecified: Secondary | ICD-10-CM

## 2024-05-11 DIAGNOSIS — K642 Third degree hemorrhoids: Secondary | ICD-10-CM | POA: Diagnosis not present

## 2024-05-11 DIAGNOSIS — K643 Fourth degree hemorrhoids: Secondary | ICD-10-CM

## 2024-05-11 DIAGNOSIS — K625 Hemorrhage of anus and rectum: Secondary | ICD-10-CM | POA: Diagnosis not present

## 2024-05-11 DIAGNOSIS — K5900 Constipation, unspecified: Secondary | ICD-10-CM

## 2024-05-11 NOTE — Progress Notes (Signed)
 Patient ID: Shelly Fields, female   DOB: [birth year], 89 y.o.   MRN: [id number]  Reason for Consult: New Patient (Initial Visit)   Referred by Shelly Leta NOVAK, MD  Subjective:     HPI  Shelly Fields is a 89 y.o. female who presents for evaluation of a right ankle wound with bleeding.  She has had telangiectasias, small varicosities and venous congestion for many years.  She is also has significant swelling that has recently went down with the switch from Lasix  to torsemide .  She does have right ventricular heart dysfunction with an elevated RVSP and severely dilated right and left atria.  She does have some difficulty walking but denies significant aching or heaviness.  She is accompanied by her son and they are mainly worried about the wound that is scabbed over and the possibility of rebleed.  Past Medical History:  Diagnosis Date   Cor pulmonale (chronic) (HCC)    Diverticulosis    Essential hypertension    Hypothyroidism    Internal hemorrhoids    Persistent atrial fibrillation (HCC)    Pulmonary hypertension (HCC)    Seasonal allergies    Shingles    Family History  Problem Relation Age of Onset   Heart disease Mother    Hypertension Father    Colon cancer Neg Hx    Past Surgical History:  Procedure Laterality Date   ABDOMINAL HYSTERECTOMY     BIOPSY  03/25/2023   Procedure: BIOPSY;  Surgeon: Cinderella Deatrice FALCON, MD;  [facility];  Service: Endoscopy;;   CATARACT EXTRACTION     COLONOSCOPY N/A 01/12/2013   Procedure: COLONOSCOPY;  Surgeon: Claudis RAYMOND Rivet, MD;  Location: [facility];  Service: Endoscopy;  Laterality: N/A;  1030   COLONOSCOPY N/A 11/18/2017   Procedure: COLONOSCOPY;  Surgeon: Rivet Claudis RAYMOND, MD;  Location: [facility];  Service: Endoscopy;  Laterality: N/A;   COLONOSCOPY WITH PROPOFOL  N/A 03/25/2023   Procedure: COLONOSCOPY WITH PROPOFOL ;  Surgeon: Cinderella Deatrice FALCON, MD;  [facility];  Service: Endoscopy;  Laterality: N/A;   11:00AM;ASA 3   ENTEROSCOPY N/A 10/31/2023   Procedure: ENTEROSCOPY;  Surgeon: Stacia Glendia BRAVO, MD;  Location: [facility] ENDOSCOPY;  Service: Gastroenterology;  Laterality: N/A;   ESOPHAGOGASTRODUODENOSCOPY (EGD) WITH PROPOFOL  N/A 03/25/2023   Procedure: ESOPHAGOGASTRODUODENOSCOPY (EGD) WITH PROPOFOL ;  Surgeon: Cinderella Deatrice FALCON, MD;  [facility]: [facility];  Service: Endoscopy;  Laterality: N/A;  11:00;ASA 3   GIVENS CAPSULE STUDY N/A 10/29/2023   Procedure: IMAGING PROCEDURE, GI TRACT, INTRALUMINAL, VIA CAPSULE;  Surgeon: Stacia Glendia BRAVO, MD;  Location: [facility] ENDOSCOPY;  Service: Gastroenterology;  Laterality: N/A;   HOT HEMOSTASIS N/A 10/31/2023   Procedure: ARGON PLASMA COAGULATION;  Surgeon: Stacia Glendia BRAVO, MD;  Location: [facility] ENDOSCOPY;  Service: Gastroenterology;  Laterality: N/A;   LESION REMOVAL Right 09/13/2023   Procedure: excision of right lower extremity squamous cell carcinoma;  Surgeon: Evonnie Dorothyann LABOR, DO;  Location: [facility];  Service: General;  Laterality: Right;   POLYPECTOMY  11/18/2017   Procedure: POLYPECTOMY;  Surgeon: Rivet Claudis RAYMOND, MD;  Location: [facility];  Service: Endoscopy;;  colon    Short Social History:  Social History   Tobacco Use   Smoking status: Never    Passive exposure: Past   Smokeless tobacco: Never  Substance Use Topics   Alcohol use: No    Alcohol/week: 0.0 standard drinks of alcohol    No Known Allergies  Current Outpatient Medications  Medication  Sig Dispense Refill   acetaminophen  (TYLENOL ) 325 MG tablet Take 2 tablets (650 mg total) by mouth every 6 (six) hours as needed for mild pain (pain score 1-3) (or Fever >/= 101).     aspirin  EC 81 MG tablet Take 1 tablet (81 mg total) by mouth daily. Swallow whole. 30 tablet 12   Carboxymethylcellulose Sodium (THERATEARS OP) Place 1 drop into both eyes daily.      denosumab  (PROLIA ) 60 MG/ML SOSY injection Inject 60 mg into the skin every 6 (six) months.     docusate sodium  (COLACE)  100 MG capsule Take 1 capsule (100 mg total) by mouth 2 (two) times daily. 60 capsule 2   FARXIGA  10 MG TABS tablet Take 1 tablet (10 mg total) by mouth daily before breakfast. 30 tablet 6   ferrous gluconate  (FERGON) 324 MG tablet Take 1 tablet (324 mg total) by mouth every other day. 15 tablet 0   levothyroxine  (SYNTHROID ) 75 MCG tablet Take 1 tablet (75 mcg total) by mouth daily before breakfast. 30 tablet 0   melatonin 3 MG TABS tablet Take 1 tablet (3 mg total) by mouth at bedtime as needed. 30 tablet 0   metoprolol  succinate (TOPROL -XL) 100 MG 24 hr tablet Take 1 tablet (100 mg total) by mouth in the morning and at bedtime. Take with or immediately following a meal. 180 tablet 3   pantoprazole  (PROTONIX ) 40 MG tablet Take 1 tablet (40 mg total) by mouth daily. 90 tablet 3   potassium chloride  SA (KLOR-CON  M) 20 MEQ tablet Take 1 tablet (20 mEq total) by mouth daily. 90 tablet 3   psyllium (METAMUCIL) 58.6 % packet Take 1 packet by mouth 2 (two) times daily. 30 each 12   torsemide  (DEMADEX ) 20 MG tablet Take 20 mg by mouth daily.     furosemide  (LASIX ) 20 MG tablet Take 20 mg daily alternating with 40 mg every other day (Patient not taking: Reported on 05/12/2024) 135 tablet 3   No current facility-administered medications for this visit.    REVIEW OF SYSTEMS All other systems were reviewed and are negative    Objective:  Objective   Vitals:   05/12/24 1438  BP: 127/74  Pulse: 92  Resp: 20  Temp: 98.4 F (36.9 C)  TempSrc: Temporal  SpO2: 99%  Weight: 87 lb 4.8 oz (39.6 kg)  Height: 5' 2 (1.575 m)   Body mass index is 15.97 kg/m.  Physical Exam General: no acute distress Cardiac: hemodynamically stable Extremities: Bilateral venous congestion present throughout feet and lower legs.  Mild swelling bilaterally.  Approximately 2 cm circular scab on the right medial ankle no ongoing bleeding. Vascular:   Right: palpable DP  Left: palpable DP   Data: Reflux  study +--------------+---------+------+-----------+------------+--------+  RIGHT        Reflux NoRefluxReflux TimeDiameter cmsComments                          Yes                                   +--------------+---------+------+-----------+------------+--------+  CFV          no                                              +--------------+---------+------+-----------+------------+--------+  FV mid        no                                              +--------------+---------+------+-----------+------------+--------+  Popliteal    no                                              +--------------+---------+------+-----------+------------+--------+  GSV at SFJ    no                            .55               +--------------+---------+------+-----------+------------+--------+  GSV prox thighno                            .56               +--------------+---------+------+-----------+------------+--------+  GSV mid thigh no                            .50               +--------------+---------+------+-----------+------------+--------+  GSV dist thighno                            .49               +--------------+---------+------+-----------+------------+--------+  GSV at knee   no                            .51               +--------------+---------+------+-----------+------------+--------+  GSV prox calf no                            .60               +--------------+---------+------+-----------+------------+--------+  GSV mid calf  no                            .58               +--------------+---------+------+-----------+------------+--------+  GSV dist calf no                            .54               +--------------+---------+------+-----------+------------+--------+  [employer]    no                            .21                +--------------+---------+------+-----------+------------+--------+  SSV prox calf no                            .23               +--------------+---------+------+-----------+------------+--------+  SSV mid calf  no                            .  18               +--------------+---------+------+-----------+------------+--------+       Assessment/Plan:     CASSITY CHRISTIAN is a 89 y.o. female with a right medial ankle wound and significant lower extremity swelling that has been much improved with torsemide .  I explained that the ultrasound today did not currently show venous reflux and most of her swelling is likely related to her heart failure. Regardless we discussed the foundation of leg swelling with compression and elevation. I explained to the patient and her son that I would like them to continue to use any wound ointment such as Neosporin on the scab followed by some gauze or Band-Aid for padding and then her compression stockings. I explained the importance of compression during the day and explained that if she stays in control the swelling then the wound should heal and minimize recurrence. Follow-up as needed       Norman GORMAN Serve MD [facility]

## 2024-05-11 NOTE — Patient Instructions (Signed)
 Continue Preparation H internally and externally to help with swelling and prevent bleeding.  Given your prior small bowel bleeding I would you to continue taking pantoprazole  40 mg once daily and also continue your daily iron .  Given your hemorrhoids and going to rerefer you to [facility] surgery in [place] for further evaluation of your hemorrhoids and to discuss treatment options.  Given your anemia you should have your hemoglobin levels checked at least every 3 months.   Follow-up in 6 months, sooner if needed.  Please contact us  if you have any recurrent rectal bleeding or black/tarry stools.  It was a pleasure to see you today. I want to create trusting relationships with patients. If you receive a survey regarding your visit,  I greatly appreciate you taking time to fill this out on paper or through your MyChart. I value your feedback.  Charmaine Melia, MSN, FNP-BC, AGACNP-BC [facility]

## 2024-05-12 ENCOUNTER — Encounter: Payer: Self-pay | Admitting: Vascular Surgery

## 2024-05-12 ENCOUNTER — Ambulatory Visit: Attending: Vascular Surgery | Admitting: Vascular Surgery

## 2024-05-12 VITALS — BP 127/74 | HR 92 | Temp 98.4°F | Resp 20 | Ht 62.0 in | Wt 87.3 lb

## 2024-05-12 DIAGNOSIS — I781 Nevus, non-neoplastic: Secondary | ICD-10-CM

## 2024-05-17 ENCOUNTER — Encounter: Payer: Self-pay | Admitting: Audiology

## 2024-05-25 ENCOUNTER — Encounter (HOSPITAL_BASED_OUTPATIENT_CLINIC_OR_DEPARTMENT_OTHER): Attending: Internal Medicine | Admitting: Internal Medicine

## 2024-05-25 DIAGNOSIS — I272 Pulmonary hypertension, unspecified: Secondary | ICD-10-CM | POA: Diagnosis not present

## 2024-05-25 DIAGNOSIS — I5032 Chronic diastolic (congestive) heart failure: Secondary | ICD-10-CM | POA: Diagnosis not present

## 2024-05-25 DIAGNOSIS — I781 Nevus, non-neoplastic: Secondary | ICD-10-CM

## 2024-05-25 DIAGNOSIS — R600 Localized edema: Secondary | ICD-10-CM | POA: Diagnosis not present

## 2024-05-25 DIAGNOSIS — L97812 Non-pressure chronic ulcer of other part of right lower leg with fat layer exposed: Secondary | ICD-10-CM | POA: Diagnosis present

## 2024-06-12 ENCOUNTER — Encounter

## 2024-06-15 ENCOUNTER — Encounter (HOSPITAL_BASED_OUTPATIENT_CLINIC_OR_DEPARTMENT_OTHER): Attending: General Surgery | Admitting: General Surgery

## 2024-06-15 DIAGNOSIS — I110 Hypertensive heart disease with heart failure: Secondary | ICD-10-CM | POA: Insufficient documentation

## 2024-06-15 DIAGNOSIS — R600 Localized edema: Secondary | ICD-10-CM | POA: Diagnosis not present

## 2024-06-15 DIAGNOSIS — L97812 Non-pressure chronic ulcer of other part of right lower leg with fat layer exposed: Secondary | ICD-10-CM | POA: Diagnosis present

## 2024-06-15 DIAGNOSIS — E11622 Type 2 diabetes mellitus with other skin ulcer: Secondary | ICD-10-CM | POA: Insufficient documentation

## 2024-06-15 DIAGNOSIS — I272 Pulmonary hypertension, unspecified: Secondary | ICD-10-CM | POA: Insufficient documentation

## 2024-06-15 DIAGNOSIS — I5032 Chronic diastolic (congestive) heart failure: Secondary | ICD-10-CM | POA: Insufficient documentation

## 2024-06-19 ENCOUNTER — Encounter (HOSPITAL_BASED_OUTPATIENT_CLINIC_OR_DEPARTMENT_OTHER): Admitting: Internal Medicine

## 2024-06-21 ENCOUNTER — Ambulatory Visit: Attending: Cardiology | Admitting: Cardiology

## 2024-06-21 ENCOUNTER — Encounter: Payer: Self-pay | Admitting: Cardiology

## 2024-06-21 VITALS — BP 120/72 | HR 64 | Ht 62.0 in | Wt 88.4 lb

## 2024-06-21 DIAGNOSIS — I4821 Permanent atrial fibrillation: Secondary | ICD-10-CM | POA: Diagnosis not present

## 2024-06-21 DIAGNOSIS — Z8673 Personal history of transient ischemic attack (TIA), and cerebral infarction without residual deficits: Secondary | ICD-10-CM | POA: Diagnosis not present

## 2024-06-21 DIAGNOSIS — I5032 Chronic diastolic (congestive) heart failure: Secondary | ICD-10-CM | POA: Diagnosis not present

## 2024-06-21 NOTE — Patient Instructions (Signed)
 Medication Instructions:   Your physician recommends that you continue on your current medications as directed. Please refer to the Current Medication list given to you today.   Labwork: None today  Testing/Procedures: None today  Follow-Up: 4 months Dr.McDowell  Any Other Special Instructions Will Be Listed Below (If Applicable).  If you need a refill on your cardiac medications before your next appointment, please call your pharmacy.

## 2024-06-21 NOTE — Progress Notes (Signed)
"  ° ° °  Cardiology Office Note  Date: 06/21/2024   ID: Shelly Fields, DOB 07/25/1934, MRN [id number]  History of Present Illness: Shelly Fields is an 89 y.o. female last seen in September 2025.  She is here today for a follow-up visit.  Weight is stable and she does not report any progressive lower extremity edema.  She is following in the wound clinic for management of a chronic right lower leg wound.  She does not report any palpitations or chest pain.  We went over her medications today.  She was switched from Lasix  to Demadex  by PCP which is providing good control of her leg edema.  Heart rate is much better controlled on Toprol -XL adjusted at her last visit.  Physical Exam: VS:  BP 120/72 (BP Location: Right Arm, Cuff Size: Small)   Pulse 64   Ht 5' 2 (1.575 m)   Wt 88 lb 6.4 oz (40.1 kg)   BMI 16.17 kg/m , BMI Body mass index is 16.17 kg/m.  Wt Readings from Last 3 Encounters:  06/21/24 88 lb 6.4 oz (40.1 kg)  05/12/24 87 lb 4.8 oz (39.6 kg)  05/11/24 88 lb 6.4 oz (40.1 kg)    General: Patient appears comfortable at rest. HEENT: Conjunctiva and lids normal. Neck: Supple, no elevated JVP or carotid bruits. Lungs: Clear to auscultation, nonlabored breathing at rest. Cardiac: Irregularly irregular, 2/6 systolic murmur without gallop. Extremities: Right lower leg wound dressed.  ECG:  An ECG dated 10/21/2023 was personally reviewed today and demonstrated:  Atrial fibrillation with rightward axis.  Labwork: 09/16/2023: B Natriuretic Peptide 636.0 09/19/2023: TSH 11.008 10/23/2023: Magnesium 2.0 10/27/2023: ALT 12; AST 17 11/02/2023: Platelets 339 11/03/2023: BUN 13; Creatinine, Ser 0.82; Potassium 4.1; Sodium 127 11/04/2023: Hemoglobin 8.9     Component Value Date/Time   CHOL 112 10/22/2023 0443   TRIG 43 10/22/2023 0443   HDL 44 10/22/2023 0443   CHOLHDL 2.5 10/22/2023 0443   VLDL 9 10/22/2023 0443   LDLCALC 59 10/22/2023 0443  October 2025: BUN 19, creatinine 1, GFR 55,  potassium 3.8, hemoglobin 9, platelets 200  Other Studies Reviewed Today:  No interval cardiac testing for review today.  Assessment and Plan:  1.  Permanent atrial fibrillation with CHA2DS2-VASc score of 4.  No longer on anticoagulation given severe GI bleed.  She is asymptomatic and has good heart rate controlled on Toprol -XL 100 mg twice daily, no changes were made today   2.  HFpEF with LVEF 70 to 75% by echocardiogram in April 2025, mild RV dysfunction also noted.  Continue Demadex  20 mg daily with potassium supplement.  She is also on Farxiga  10 mg daily.  Weight is stable.   3.  Primary hypertension.  Blood pressure well-controlled today.   4.  History of stroke.  Continue aspirin  81 mg daily.  Disposition:  Follow up 4 months in the [place] office.  Signed, Jayson JUDITHANN Sierras, M.D., F.A.C.C. [facility] "

## 2024-06-28 ENCOUNTER — Telehealth: Payer: Self-pay | Admitting: Gastroenterology

## 2024-06-28 NOTE — Telephone Encounter (Signed)
 I called [facility] to check on referral and was told they had called the patient multiple times but she has never called them back.  I called and left a message for the patient to call their office to get scheduled for an appointment.

## 2024-06-29 ENCOUNTER — Encounter (HOSPITAL_BASED_OUTPATIENT_CLINIC_OR_DEPARTMENT_OTHER): Admitting: General Surgery

## 2024-06-29 DIAGNOSIS — E11622 Type 2 diabetes mellitus with other skin ulcer: Secondary | ICD-10-CM | POA: Diagnosis not present

## 2024-07-06 ENCOUNTER — Other Ambulatory Visit: Payer: Self-pay | Admitting: *Deleted

## 2024-07-06 MED ORDER — FARXIGA 10 MG PO TABS: 10.0000 mg | ORAL_TABLET | Freq: Every day | ORAL | 3 refills | Status: AC

## 2024-07-10 NOTE — Telephone Encounter (Signed)
 Pt wants I know if she needs to continue taking medication attached, pt would like a c/b regarding this matter. Please advise.

## 2024-07-13 ENCOUNTER — Encounter (HOSPITAL_BASED_OUTPATIENT_CLINIC_OR_DEPARTMENT_OTHER): Admitting: General Surgery

## 2024-07-26 ENCOUNTER — Encounter (HOSPITAL_BASED_OUTPATIENT_CLINIC_OR_DEPARTMENT_OTHER): Admitting: General Surgery

## 2024-07-27 ENCOUNTER — Encounter (HOSPITAL_BASED_OUTPATIENT_CLINIC_OR_DEPARTMENT_OTHER): Admitting: General Surgery

## 2024-10-31 ENCOUNTER — Ambulatory Visit: Admitting: Cardiology
